# Patient Record
Sex: Female | Born: 1955 | Race: White | Hispanic: No | Marital: Married | Smoking: Never smoker
Health system: Southern US, Community
[De-identification: ages and names within clinical notes are randomized; demographics above are authoritative.]

## PROBLEM LIST (undated history)

## (undated) DIAGNOSIS — Z8619 Personal history of other infectious and parasitic diseases: Secondary | ICD-10-CM

## (undated) DIAGNOSIS — D509 Iron deficiency anemia, unspecified: Secondary | ICD-10-CM

## (undated) DIAGNOSIS — R112 Nausea with vomiting, unspecified: Secondary | ICD-10-CM

## (undated) DIAGNOSIS — Z8669 Personal history of other diseases of the nervous system and sense organs: Secondary | ICD-10-CM

## (undated) DIAGNOSIS — T451X5A Adverse effect of antineoplastic and immunosuppressive drugs, initial encounter: Secondary | ICD-10-CM

## (undated) DIAGNOSIS — N96 Recurrent pregnancy loss: Secondary | ICD-10-CM

## (undated) DIAGNOSIS — C189 Malignant neoplasm of colon, unspecified: Secondary | ICD-10-CM

## (undated) DIAGNOSIS — Z9889 Other specified postprocedural states: Secondary | ICD-10-CM

## (undated) HISTORY — DX: Personal history of other diseases of the nervous system and sense organs: Z86.69

## (undated) HISTORY — DX: Personal history of other infectious and parasitic diseases: Z86.19

## (undated) HISTORY — PX: TONSILLECTOMY: SUR1361

## (undated) HISTORY — DX: Iron deficiency anemia, unspecified: D50.9

## (undated) HISTORY — DX: Recurrent pregnancy loss: N96

---

## 1999-02-06 ENCOUNTER — Other Ambulatory Visit: Admission: RE | Admit: 1999-02-06 | Discharge: 1999-02-06 | Payer: Self-pay | Admitting: Obstetrics & Gynecology

## 2007-01-10 ENCOUNTER — Emergency Department (HOSPITAL_COMMUNITY): Admission: EM | Admit: 2007-01-10 | Discharge: 2007-01-10 | Payer: Self-pay | Admitting: Emergency Medicine

## 2007-11-04 HISTORY — PX: SHOULDER SURGERY: SHX246

## 2008-05-01 ENCOUNTER — Encounter: Admission: RE | Admit: 2008-05-01 | Discharge: 2008-05-01 | Payer: Self-pay | Admitting: Sports Medicine

## 2008-09-21 ENCOUNTER — Ambulatory Visit (HOSPITAL_BASED_OUTPATIENT_CLINIC_OR_DEPARTMENT_OTHER): Admission: RE | Admit: 2008-09-21 | Discharge: 2008-09-21 | Payer: Self-pay | Admitting: Orthopedic Surgery

## 2010-12-02 ENCOUNTER — Encounter
Admission: RE | Admit: 2010-12-02 | Discharge: 2010-12-02 | Payer: Self-pay | Source: Home / Self Care | Attending: Family Medicine | Admitting: Family Medicine

## 2010-12-04 ENCOUNTER — Ambulatory Visit
Admission: RE | Admit: 2010-12-04 | Discharge: 2010-12-04 | Disposition: A | Discharge status: Home / Self Care | Payer: BC Managed Care – PPO | Source: Ambulatory Visit | Attending: Family Medicine | Admitting: Family Medicine

## 2010-12-04 ENCOUNTER — Other Ambulatory Visit: Payer: Self-pay | Admitting: Family Medicine

## 2010-12-04 DIAGNOSIS — R160 Hepatomegaly, not elsewhere classified: Secondary | ICD-10-CM

## 2010-12-04 DIAGNOSIS — C189 Malignant neoplasm of colon, unspecified: Secondary | ICD-10-CM

## 2010-12-04 HISTORY — DX: Malignant neoplasm of colon, unspecified: C18.9

## 2010-12-04 MED ORDER — GADOXETATE DISODIUM 0.25 MMOL/ML IV SOLN: 6.0000 mL | Freq: Once | INTRAVENOUS | Status: AC | PRN

## 2010-12-06 ENCOUNTER — Other Ambulatory Visit (HOSPITAL_COMMUNITY): Payer: Self-pay | Admitting: Family Medicine

## 2010-12-06 DIAGNOSIS — R160 Hepatomegaly, not elsewhere classified: Secondary | ICD-10-CM

## 2010-12-10 ENCOUNTER — Other Ambulatory Visit (HOSPITAL_COMMUNITY): Payer: Self-pay | Admitting: Family Medicine

## 2010-12-10 ENCOUNTER — Other Ambulatory Visit: Payer: Self-pay | Admitting: Diagnostic Radiology

## 2010-12-10 ENCOUNTER — Ambulatory Visit (HOSPITAL_COMMUNITY)
Admission: RE | Admit: 2010-12-10 | Discharge: 2010-12-10 | Disposition: A | Discharge status: Home / Self Care | Payer: BC Managed Care – PPO | Source: Ambulatory Visit | Attending: Family Medicine | Admitting: Family Medicine

## 2010-12-10 DIAGNOSIS — R160 Hepatomegaly, not elsewhere classified: Secondary | ICD-10-CM

## 2010-12-10 DIAGNOSIS — K7689 Other specified diseases of liver: Secondary | ICD-10-CM | POA: Insufficient documentation

## 2010-12-10 LAB — CBC
MCH: 25.1 pg — ABNORMAL LOW (ref 26.0–34.0)
MCHC: 32.2 g/dL (ref 30.0–36.0)
Platelets: 262 10*3/uL (ref 150–400)
RBC: 4.74 MIL/uL (ref 3.87–5.11)
RDW: 16.5 % — ABNORMAL HIGH (ref 11.5–15.5)

## 2010-12-10 LAB — APTT: aPTT: 32 seconds (ref 24–37)

## 2010-12-11 ENCOUNTER — Encounter (HOSPITAL_BASED_OUTPATIENT_CLINIC_OR_DEPARTMENT_OTHER): Payer: BC Managed Care – PPO | Admitting: Oncology

## 2010-12-11 ENCOUNTER — Other Ambulatory Visit: Payer: Self-pay | Admitting: Oncology

## 2010-12-11 DIAGNOSIS — C787 Secondary malignant neoplasm of liver and intrahepatic bile duct: Secondary | ICD-10-CM

## 2010-12-13 ENCOUNTER — Ambulatory Visit (HOSPITAL_COMMUNITY)
Admission: RE | Admit: 2010-12-13 | Discharge: 2010-12-13 | Disposition: A | Discharge status: Home / Self Care | Payer: BC Managed Care – PPO | Source: Ambulatory Visit | Attending: Oncology | Admitting: Oncology

## 2010-12-13 DIAGNOSIS — R1011 Right upper quadrant pain: Secondary | ICD-10-CM | POA: Insufficient documentation

## 2010-12-13 DIAGNOSIS — C787 Secondary malignant neoplasm of liver and intrahepatic bile duct: Secondary | ICD-10-CM | POA: Insufficient documentation

## 2010-12-13 DIAGNOSIS — K6389 Other specified diseases of intestine: Secondary | ICD-10-CM | POA: Insufficient documentation

## 2010-12-13 MED ORDER — IOHEXOL 300 MG/ML  SOLN
100.0000 mL | Freq: Once | INTRAMUSCULAR | Status: AC | PRN
  Administered 2010-12-13: 100 mL via INTRAVENOUS

## 2010-12-17 ENCOUNTER — Ambulatory Visit (HOSPITAL_COMMUNITY)
Admission: RE | Admit: 2010-12-17 | Discharge: 2010-12-17 | Disposition: A | Discharge status: Home / Self Care | Payer: BC Managed Care – PPO | Source: Ambulatory Visit | Attending: Gastroenterology | Admitting: Gastroenterology

## 2010-12-17 ENCOUNTER — Other Ambulatory Visit: Payer: Self-pay | Admitting: Gastroenterology

## 2010-12-17 DIAGNOSIS — K648 Other hemorrhoids: Secondary | ICD-10-CM | POA: Insufficient documentation

## 2010-12-17 DIAGNOSIS — D126 Benign neoplasm of colon, unspecified: Secondary | ICD-10-CM | POA: Insufficient documentation

## 2010-12-17 DIAGNOSIS — C184 Malignant neoplasm of transverse colon: Secondary | ICD-10-CM | POA: Insufficient documentation

## 2010-12-17 DIAGNOSIS — K644 Residual hemorrhoidal skin tags: Secondary | ICD-10-CM | POA: Insufficient documentation

## 2010-12-17 DIAGNOSIS — C787 Secondary malignant neoplasm of liver and intrahepatic bile duct: Secondary | ICD-10-CM | POA: Insufficient documentation

## 2010-12-18 ENCOUNTER — Other Ambulatory Visit: Payer: Self-pay | Admitting: Oncology

## 2010-12-18 ENCOUNTER — Encounter (HOSPITAL_BASED_OUTPATIENT_CLINIC_OR_DEPARTMENT_OTHER): Payer: BC Managed Care – PPO | Admitting: Oncology

## 2010-12-18 DIAGNOSIS — C229 Malignant neoplasm of liver, not specified as primary or secondary: Secondary | ICD-10-CM

## 2010-12-18 DIAGNOSIS — C787 Secondary malignant neoplasm of liver and intrahepatic bile duct: Secondary | ICD-10-CM

## 2010-12-20 ENCOUNTER — Ambulatory Visit (HOSPITAL_COMMUNITY)
Admission: RE | Admit: 2010-12-20 | Discharge: 2010-12-20 | Disposition: A | Discharge status: Home / Self Care | Payer: BC Managed Care – PPO | Source: Ambulatory Visit | Attending: Oncology | Admitting: Oncology

## 2010-12-20 DIAGNOSIS — C787 Secondary malignant neoplasm of liver and intrahepatic bile duct: Secondary | ICD-10-CM | POA: Insufficient documentation

## 2010-12-20 DIAGNOSIS — C229 Malignant neoplasm of liver, not specified as primary or secondary: Secondary | ICD-10-CM

## 2010-12-20 DIAGNOSIS — C189 Malignant neoplasm of colon, unspecified: Secondary | ICD-10-CM | POA: Insufficient documentation

## 2010-12-20 HISTORY — PX: PORTACATH PLACEMENT: SHX2246

## 2010-12-24 ENCOUNTER — Other Ambulatory Visit: Payer: Self-pay | Admitting: Oncology

## 2010-12-24 ENCOUNTER — Encounter (HOSPITAL_BASED_OUTPATIENT_CLINIC_OR_DEPARTMENT_OTHER): Payer: BC Managed Care – PPO | Admitting: Oncology

## 2010-12-24 DIAGNOSIS — C184 Malignant neoplasm of transverse colon: Secondary | ICD-10-CM

## 2010-12-24 DIAGNOSIS — C787 Secondary malignant neoplasm of liver and intrahepatic bile duct: Secondary | ICD-10-CM

## 2010-12-24 DIAGNOSIS — Z136 Encounter for screening for cardiovascular disorders: Secondary | ICD-10-CM

## 2010-12-24 LAB — URINALYSIS, MICROSCOPIC - CHCC
Bilirubin (Urine): NEGATIVE
Blood: NEGATIVE
Leukocyte Esterase: NEGATIVE
Nitrite: NEGATIVE
Specific Gravity, Urine: 1.02 (ref 1.003–1.035)
pH: 5 (ref 4.6–8.0)

## 2010-12-24 LAB — CBC WITH DIFFERENTIAL/PLATELET
Basophils Absolute: 0 10*3/uL (ref 0.0–0.1)
EOS%: 1 % (ref 0.0–7.0)
Eosinophils Absolute: 0.1 10*3/uL (ref 0.0–0.5)
HCT: 35.3 % (ref 34.8–46.6)
HGB: 11.7 g/dL (ref 11.6–15.9)
MCH: 25.9 pg (ref 25.1–34.0)
MCV: 78.1 fL — ABNORMAL LOW (ref 79.5–101.0)
MONO%: 8.8 % (ref 0.0–14.0)
NEUT%: 82.7 % — ABNORMAL HIGH (ref 38.4–76.8)

## 2010-12-24 LAB — COMPREHENSIVE METABOLIC PANEL
AST: 106 U/L — ABNORMAL HIGH (ref 0–37)
Albumin: 3.6 g/dL (ref 3.5–5.2)
BUN: 12 mg/dL (ref 6–23)
Calcium: 9.4 mg/dL (ref 8.4–10.5)
Chloride: 94 mEq/L — ABNORMAL LOW (ref 96–112)
Potassium: 3.6 mEq/L (ref 3.5–5.3)
Sodium: 133 mEq/L — ABNORMAL LOW (ref 135–145)
Total Protein: 7.9 g/dL (ref 6.0–8.3)

## 2010-12-24 LAB — HCG, SERUM, QUALITATIVE: Preg, Serum: NEGATIVE

## 2010-12-24 LAB — APTT: aPTT: 37 seconds (ref 24–37)

## 2010-12-31 ENCOUNTER — Encounter (HOSPITAL_BASED_OUTPATIENT_CLINIC_OR_DEPARTMENT_OTHER): Payer: BC Managed Care – PPO | Admitting: Oncology

## 2010-12-31 ENCOUNTER — Other Ambulatory Visit: Payer: Self-pay | Admitting: Oncology

## 2010-12-31 DIAGNOSIS — C787 Secondary malignant neoplasm of liver and intrahepatic bile duct: Secondary | ICD-10-CM

## 2010-12-31 DIAGNOSIS — Z5112 Encounter for antineoplastic immunotherapy: Secondary | ICD-10-CM

## 2010-12-31 DIAGNOSIS — Z5111 Encounter for antineoplastic chemotherapy: Secondary | ICD-10-CM

## 2010-12-31 DIAGNOSIS — C184 Malignant neoplasm of transverse colon: Secondary | ICD-10-CM

## 2011-01-01 LAB — CEA: CEA: 1806.5 ng/mL — ABNORMAL HIGH (ref 0.0–5.0)

## 2011-01-02 ENCOUNTER — Encounter (HOSPITAL_BASED_OUTPATIENT_CLINIC_OR_DEPARTMENT_OTHER): Payer: BC Managed Care – PPO | Admitting: Oncology

## 2011-01-02 DIAGNOSIS — C184 Malignant neoplasm of transverse colon: Secondary | ICD-10-CM

## 2011-01-02 DIAGNOSIS — C787 Secondary malignant neoplasm of liver and intrahepatic bile duct: Secondary | ICD-10-CM

## 2011-01-04 ENCOUNTER — Observation Stay (HOSPITAL_COMMUNITY)
Admission: AD | Admit: 2011-01-04 | Discharge: 2011-01-05 | Disposition: A | Discharge status: Home / Self Care | Payer: BC Managed Care – PPO | Source: Ambulatory Visit | Attending: Oncology | Admitting: Oncology

## 2011-01-04 DIAGNOSIS — Z79899 Other long term (current) drug therapy: Secondary | ICD-10-CM | POA: Insufficient documentation

## 2011-01-04 DIAGNOSIS — T451X5A Adverse effect of antineoplastic and immunosuppressive drugs, initial encounter: Secondary | ICD-10-CM | POA: Insufficient documentation

## 2011-01-04 DIAGNOSIS — C787 Secondary malignant neoplasm of liver and intrahepatic bile duct: Secondary | ICD-10-CM | POA: Insufficient documentation

## 2011-01-04 DIAGNOSIS — R109 Unspecified abdominal pain: Secondary | ICD-10-CM | POA: Insufficient documentation

## 2011-01-04 DIAGNOSIS — R112 Nausea with vomiting, unspecified: Principal | ICD-10-CM | POA: Insufficient documentation

## 2011-01-04 DIAGNOSIS — G43909 Migraine, unspecified, not intractable, without status migrainosus: Secondary | ICD-10-CM | POA: Insufficient documentation

## 2011-01-04 DIAGNOSIS — C189 Malignant neoplasm of colon, unspecified: Secondary | ICD-10-CM

## 2011-01-04 DIAGNOSIS — D509 Iron deficiency anemia, unspecified: Secondary | ICD-10-CM | POA: Insufficient documentation

## 2011-01-04 DIAGNOSIS — C184 Malignant neoplasm of transverse colon: Secondary | ICD-10-CM | POA: Insufficient documentation

## 2011-01-05 DIAGNOSIS — C189 Malignant neoplasm of colon, unspecified: Secondary | ICD-10-CM

## 2011-01-10 NOTE — H&P (Signed)
Natalie Mccarthy, Natalie Mccarthy            ACCOUNT NO.:  [id number]  MEDICAL RECORD NO.:  [id number]           PATIENT TYPE:  I  LOCATION:  1308                         FACILITY:  [facility]  PHYSICIAN:  Leighton Roach. Truett Perna, M.D. DATE OF BIRTH:  Jul 09, 1956  DATE OF ADMISSION:  01/04/2011 DATE OF DISCHARGE:                             HISTORY & PHYSICAL   PATIENT IDENTIFICATION:  Natalie Mccarthy is a 55 year old with a new diagnosis of metastatic colon cancer.  She is now admitted with intractable nausea/vomiting after a first cycle of FOLFIRI/Avastin chemotherapy.  HISTORY OF PRESENT ILLNESS:  Natalie Mccarthy presented with abdominal pain and intermittent nausea/vomiting.  She saw Dr. Corliss Blacker on November 22, 2010, and a mass was palpated in the mid epigastric region.  An abdominal ultrasound revealed multiple mass-like lesions in the liver. An MRI of the abdomen on December 04, 2010, confirmed multiple large masses in the liver concerning for a malignancy.  An area of wall thickening was noted at the transverse colon.  She underwent a diagnostic liver biopsy in interventional radiology on December 10, 2010, and the pathology confirmed metastatic adenocarcinoma.  She was referred to Dr. Ewing Schlein and underwent a colonoscopy on December 17, 2010.  This confirmed an ulcerated mass in the distal mid transverse colon.  A biopsy confirmed adenocarcinoma.  She enrolled in the [name] study and completed a first cycle of FOLFIRI/Avastin on 02/28.  The 5-FU pump was discontinued on 03/01.  She reports the onset of nausea and vomiting beginning on the evening of 03/01.  The nausea and vomiting has persisted.  She has been unable to tolerate liquids or solids.  Ativan has helped the nausea partially.  She denies diarrhea.  She reports no bowel movement for the past 2-3 days.  She presented to the cancer center for further evaluation today.  She received intravenous fluids and antiemetics at the cancer center  and continued to have nausea.  She had one episode of emesis while at the cancer center.  PAST MEDICAL HISTORY: 1. Migraine headaches. 2. G6, P4, 2 miscarriages, last menstrual cycle in September 2011. 3. History of mononucleosis.  PAST SURGICAL HISTORY: 1. Left shoulder surgery 2-3 years ago. 2. Tonsillectomy as a child. 3. Uterine fibroids surgery for management of heavy menses.  ALLERGIES:  PROCHLORPERAZINE.  CURRENT MEDICATIONS: 1. Ativan 0.5 to 1 mg q.6 h p.r.n. 2. Zofran 8 mg q.12 h p.r.n. 3. Tramadol 50 mg q.6 h p.r.n. 4. Imitrex 100 mg p.r.n. migraine headache. 5. Ibuprofen 800 mg p.r.n. headache.  FAMILY HISTORY:  No family history of cancer.  SOCIAL HISTORY:  She lives with her husband in [place].  She works as an Print production planner.  She does not use tobacco or alcohol.  She has no transfusion history.  She denies risk factors for HIV and hepatitis.  REVIEW OF SYSTEMS:  CONSTITUTIONAL:  She denies fever.  RESPIRATORY: Negative.  HEART:  Negative.  GU:  Negative.  GI:  As per HPI.  Shereports no bowel movement for 2-3 days.  She has intermittent right upper abdominal pain.  She also reports mild low abdominal pain for the past few days.  NEUROLOGIC:  Negative.  SKIN:  Negative.  PHYSICAL EXAMINATION:  VITAL SIGNS:  Blood pressure 112/74, pulse 80, temperature 97.9.  HEENT:  No thrush or ulcers.  LUNGS:  Clear.  HEART: Regular rhythm.  Port-A-Cath site without erythema.  ABDOMEN:  Positive hepatomegaly.  Mild tenderness in the right upper quadrant.  The abdomen is soft.  The bowel sounds are hypoactive.  EXTREMITIES:  No edema.  The skin turgor appears diminished.  Labs:  None today.  IMPRESSION: 1. Metastatic colon cancer. (a)  Multiple liver masses noted on an MRI of the abdomen, December 02, 2010, and on a CT December 13, 2010. (b)  Transverse colon mass on a CT, December 13, 2010, confirmed on a colonoscopy, December 17, 1998. (c)  Status post cycle #1 of  FOLFIRI/Avastin on December 31, 2010. 1. Abdominal pain secondary to hepatomegaly/liver metastases. 2. Intractable nausea/vomiting:  She appears to have delayed nausea     secondary to chemotherapy. 3. Anorexia, early satiety, and weight loss secondary to metastatic     colon cancer. 4. History of microcytic anemia. 5. History of migraine headaches. 6. Status post uterine fibroid surgery. 7. Status post Port-A-Cath placement December 20, 2010.  PLAN:  Ms. Goya presents with intractable nausea and vomiting at day #5 following a first cycle of FOLFIRI/Avastin.  She appears to have delayed nausea related to chemotherapy.  She received intravenous fluids, 10 mg of Decadron, and 8 mg of Zofran in the chemotherapy room today.  She had an episode of emesis while in the chemotherapy room and continues to have nausea.  She will be admitted for continued intravenous hydration and antiemetic support.  We will give her another dose of Decadron this evening.  She will use Ativan and Zofran as needed.  We will advance her diet as tolerated.  Hopefully her symptoms will improve over the next 12-24 hours.  She will be discharged to home when the nausea is under better control.  We will adjust the antiemetic regimen with the second cycle of FOLFIRI/Avastin.     Leighton Roach Truett Perna, M.D.     GBS/MEDQ  D:  01/04/2011  T:  01/04/2011  Job:  [id number]  cc:   Research Office  Electronically Signed by Thornton Papas M.D. on 01/09/2011 08:30:51 PM

## 2011-01-10 NOTE — Op Note (Signed)
NAMEILEANE, SANDO NO.:  [id number]  MEDICAL RECORD NO.:  [id number]           PATIENT TYPE:  O  LOCATION:  WLEN                         FACILITY:  [facility]  PHYSICIAN:  Petra Kuba, M.D.    DATE OF BIRTH:  [birth year]  DATE OF PROCEDURE: DATE OF DISCHARGE:                              OPERATIVE REPORT   PROCEDURE:  Colonoscopy with biopsy and polypectomy.  INDICATIONS:  Patient with probable metastatic colon cancer to the liver.  Wanted to confirm.  Consent was signed after risks, benefits, methods and options were thoroughly discussed with me prior to sedation as well as with my nurse in the office.  MEDICINES USED:  Fentanyl 100 mcg, Versed 2 mg.  PROCEDURE IN DETAIL:  Rectal inspection is pertinent for external hemorrhoids, small.  Digital examination was negative.  The pediatric video colonoscope was inserted and with some difficulty due to a tortuous sigmoid, which required abdominal pressure.  Once past that area, was easily able to advance to the cecum.  On insertion, a distal pedunculated sigmoid polyp was seen, medium-sized, as well as a tiny mid- descending polyp.  The mass was seen in the mid-to-distal transverse and the scope passed easily by it, even though it was half circumferential and ulcerative and the cecum was identified by the appendiceal orifice and the ileocecal valve.  The preparation was adequate.  There was minimal liquid stool that required washing and suctioning.  On slow withdrawal through the colon, the cecum, ascending and transverse proximal to the mass was normal.  A few cold biopsies of the mass was obtained.  The proper position of the mass was confirmed as above.  The scope was further withdrawn.  We elected not to biopsy the tiny descending polyp.  However, we went ahead and snared the distal sigmoid pedunculated polyp.  Applied electrocautery.  The polyp was removed. The stalk had a nice white coagulum without  bleeding.  The polyp was suctioned onto the head of the scope and the polyp was recovered.  The scope was reinserted to the level of the polypectomy site.  There was no bleeding.  The distal sigmoid was normal.  Anorectal pull-through and retroflexion once back in the rectum confirmed some small hemorrhoids. The scope was straightened and readvanced one more time up the sigmoid. Air was suctioned.  The scope was removed.  The patient tolerated the procedure well.  There was no obvious immediate complication.  ENDOSCOPIC DIAGNOSES: 1. Internal and external small hemorrhoids. 2. Distal sigmoid moderate-sized pedunculated polyp status post snare. 3. Ulcerative mass in the distal mid transverse status post biopsy. 4. One tiny descending polyp, not biopsied. 5. Otherwise within normal limits to the cecum.  PLAN:  Await pathology.  Hold aspirin and nonsteroidals for two weeks. Chemotherapy and further workup and plans per Dr. Truett Perna.  Happy to see back p.r.n. and repeat screening if she responds excellently to chemotherapy per Dr. Truett Perna and I will be on standby to help p.r.n.          ______________________________ Petra Kuba, M.D.     MEM/MEDQ  D:  12/17/2010  T:  12/17/2010  Job:  [id number]  cc:   Petra Kuba, M.D. Fax: [phone]  Pam Drown, M.D. Fax: [phone]  Leighton Roach Truett Perna, M.D. Fax: [phone]  Electronically Signed by Vida Rigger M.D. on 01/09/2011 07:59:23 AM

## 2011-01-14 ENCOUNTER — Other Ambulatory Visit: Payer: Self-pay | Admitting: Oncology

## 2011-01-14 ENCOUNTER — Encounter (HOSPITAL_BASED_OUTPATIENT_CLINIC_OR_DEPARTMENT_OTHER): Payer: BC Managed Care – PPO | Admitting: Oncology

## 2011-01-14 DIAGNOSIS — Z5112 Encounter for antineoplastic immunotherapy: Secondary | ICD-10-CM

## 2011-01-14 DIAGNOSIS — C184 Malignant neoplasm of transverse colon: Secondary | ICD-10-CM

## 2011-01-14 DIAGNOSIS — Z136 Encounter for screening for cardiovascular disorders: Secondary | ICD-10-CM

## 2011-01-14 DIAGNOSIS — Z5111 Encounter for antineoplastic chemotherapy: Secondary | ICD-10-CM

## 2011-01-14 DIAGNOSIS — C787 Secondary malignant neoplasm of liver and intrahepatic bile duct: Secondary | ICD-10-CM

## 2011-01-14 LAB — URINALYSIS, MICROSCOPIC - CHCC
Bilirubin (Urine): NEGATIVE
Ketones: NEGATIVE mg/dL
RBC count: NEGATIVE (ref 0–2)
WBC, UA: NEGATIVE (ref 0–2)
pH: 6 (ref 4.6–8.0)

## 2011-01-14 LAB — CBC WITH DIFFERENTIAL/PLATELET
BASO%: 2 % (ref 0.0–2.0)
Basophils Absolute: 0.1 10*3/uL (ref 0.0–0.1)
EOS%: 3.8 % (ref 0.0–7.0)
HGB: 12.3 g/dL (ref 11.6–15.9)
MCH: 25.7 pg (ref 25.1–34.0)
MONO#: 0.7 10*3/uL (ref 0.1–0.9)
RDW: 17.9 % — ABNORMAL HIGH (ref 11.2–14.5)
WBC: 3.8 10*3/uL — ABNORMAL LOW (ref 3.9–10.3)
lymph#: 0.8 10*3/uL — ABNORMAL LOW (ref 0.9–3.3)

## 2011-01-14 LAB — COMPREHENSIVE METABOLIC PANEL
AST: 39 U/L — ABNORMAL HIGH (ref 0–37)
Alkaline Phosphatase: 206 U/L — ABNORMAL HIGH (ref 39–117)
BUN: 13 mg/dL (ref 6–23)
Creatinine, Ser: 0.67 mg/dL (ref 0.40–1.20)

## 2011-01-16 DIAGNOSIS — Z452 Encounter for adjustment and management of vascular access device: Secondary | ICD-10-CM

## 2011-01-28 ENCOUNTER — Other Ambulatory Visit: Payer: Self-pay | Admitting: Oncology

## 2011-01-28 ENCOUNTER — Encounter (HOSPITAL_BASED_OUTPATIENT_CLINIC_OR_DEPARTMENT_OTHER): Payer: BC Managed Care – PPO | Admitting: Oncology

## 2011-01-28 DIAGNOSIS — Z136 Encounter for screening for cardiovascular disorders: Secondary | ICD-10-CM

## 2011-01-28 DIAGNOSIS — Z5112 Encounter for antineoplastic immunotherapy: Secondary | ICD-10-CM

## 2011-01-28 DIAGNOSIS — C184 Malignant neoplasm of transverse colon: Secondary | ICD-10-CM

## 2011-01-28 DIAGNOSIS — Z5111 Encounter for antineoplastic chemotherapy: Secondary | ICD-10-CM

## 2011-01-28 DIAGNOSIS — C189 Malignant neoplasm of colon, unspecified: Secondary | ICD-10-CM

## 2011-01-28 DIAGNOSIS — C787 Secondary malignant neoplasm of liver and intrahepatic bile duct: Secondary | ICD-10-CM

## 2011-01-28 LAB — URINALYSIS, MICROSCOPIC - CHCC
Glucose: NEGATIVE g/dL
Nitrite: NEGATIVE
RBC count: NEGATIVE (ref 0–2)
Specific Gravity, Urine: 1.03 (ref 1.003–1.035)
WBC, UA: NEGATIVE (ref 0–2)

## 2011-01-28 LAB — CBC WITH DIFFERENTIAL/PLATELET
Basophils Absolute: 0.1 10*3/uL (ref 0.0–0.1)
Eosinophils Absolute: 0.1 10*3/uL (ref 0.0–0.5)
HGB: 12.9 g/dL (ref 11.6–15.9)
LYMPH%: 13.1 % — ABNORMAL LOW (ref 14.0–49.7)
MCV: 78.2 fL — ABNORMAL LOW (ref 79.5–101.0)
MONO%: 21.7 % — ABNORMAL HIGH (ref 0.0–14.0)
NEUT#: 3.9 10*3/uL (ref 1.5–6.5)
Platelets: 204 10*3/uL (ref 145–400)

## 2011-01-28 LAB — COMPREHENSIVE METABOLIC PANEL
Albumin: 3.5 g/dL (ref 3.5–5.2)
Alkaline Phosphatase: 172 U/L — ABNORMAL HIGH (ref 39–117)
BUN: 12 mg/dL (ref 6–23)
Glucose, Bld: 128 mg/dL — ABNORMAL HIGH (ref 70–99)
Potassium: 4.1 mEq/L (ref 3.5–5.3)
Total Bilirubin: 1 mg/dL (ref 0.3–1.2)

## 2011-01-29 ENCOUNTER — Encounter: Payer: BC Managed Care – PPO | Admitting: Oncology

## 2011-01-30 ENCOUNTER — Encounter (HOSPITAL_BASED_OUTPATIENT_CLINIC_OR_DEPARTMENT_OTHER): Payer: BC Managed Care – PPO | Admitting: Oncology

## 2011-01-30 DIAGNOSIS — C184 Malignant neoplasm of transverse colon: Secondary | ICD-10-CM

## 2011-01-30 DIAGNOSIS — C787 Secondary malignant neoplasm of liver and intrahepatic bile duct: Secondary | ICD-10-CM

## 2011-02-06 ENCOUNTER — Encounter (HOSPITAL_COMMUNITY): Payer: Self-pay

## 2011-02-06 ENCOUNTER — Ambulatory Visit (HOSPITAL_COMMUNITY)
Admission: RE | Admit: 2011-02-06 | Discharge: 2011-02-06 | Disposition: A | Discharge status: Home / Self Care | Payer: BC Managed Care – PPO | Source: Ambulatory Visit | Attending: Oncology | Admitting: Oncology

## 2011-02-06 DIAGNOSIS — C787 Secondary malignant neoplasm of liver and intrahepatic bile duct: Secondary | ICD-10-CM | POA: Insufficient documentation

## 2011-02-06 DIAGNOSIS — C189 Malignant neoplasm of colon, unspecified: Secondary | ICD-10-CM

## 2011-02-06 DIAGNOSIS — C184 Malignant neoplasm of transverse colon: Secondary | ICD-10-CM | POA: Insufficient documentation

## 2011-02-06 HISTORY — DX: Malignant neoplasm of colon, unspecified: C18.9

## 2011-02-06 MED ORDER — IOHEXOL 300 MG/ML  SOLN
100.0000 mL | Freq: Once | INTRAMUSCULAR | Status: AC | PRN
  Administered 2011-02-06: 100 mL via INTRAVENOUS

## 2011-02-11 ENCOUNTER — Other Ambulatory Visit: Payer: Self-pay | Admitting: Oncology

## 2011-02-11 ENCOUNTER — Encounter (HOSPITAL_BASED_OUTPATIENT_CLINIC_OR_DEPARTMENT_OTHER): Payer: BC Managed Care – PPO | Admitting: Oncology

## 2011-02-11 DIAGNOSIS — Z136 Encounter for screening for cardiovascular disorders: Secondary | ICD-10-CM

## 2011-02-11 DIAGNOSIS — D509 Iron deficiency anemia, unspecified: Secondary | ICD-10-CM

## 2011-02-11 DIAGNOSIS — C184 Malignant neoplasm of transverse colon: Secondary | ICD-10-CM

## 2011-02-11 DIAGNOSIS — Z5112 Encounter for antineoplastic immunotherapy: Secondary | ICD-10-CM

## 2011-02-11 DIAGNOSIS — C787 Secondary malignant neoplasm of liver and intrahepatic bile duct: Secondary | ICD-10-CM

## 2011-02-11 LAB — URINALYSIS, MICROSCOPIC - CHCC
Glucose: NEGATIVE g/dL
Leukocyte Esterase: NEGATIVE
Nitrite: NEGATIVE
Protein: NEGATIVE mg/dL

## 2011-02-11 LAB — CBC WITH DIFFERENTIAL/PLATELET
BASO%: 2.2 % — ABNORMAL HIGH (ref 0.0–2.0)
EOS%: 5.2 % (ref 0.0–7.0)
Eosinophils Absolute: 0.1 10*3/uL (ref 0.0–0.5)
LYMPH%: 29.6 % (ref 14.0–49.7)
MCH: 25.6 pg (ref 25.1–34.0)
MCHC: 32.4 g/dL (ref 31.5–36.0)
MCV: 78.9 fL — ABNORMAL LOW (ref 79.5–101.0)
MONO%: 34.8 % — ABNORMAL HIGH (ref 0.0–14.0)
NEUT#: 0.8 10*3/uL — ABNORMAL LOW (ref 1.5–6.5)
Platelets: 249 10*3/uL (ref 145–400)
RBC: 4.65 10*6/uL (ref 3.70–5.45)
RDW: 17.8 % — ABNORMAL HIGH (ref 11.2–14.5)
nRBC: 0 % (ref 0–0)

## 2011-02-11 LAB — COMPREHENSIVE METABOLIC PANEL
Alkaline Phosphatase: 171 U/L — ABNORMAL HIGH (ref 39–117)
BUN: 12 mg/dL (ref 6–23)
CO2: 29 mEq/L (ref 19–32)
Glucose, Bld: 99 mg/dL (ref 70–99)
Total Bilirubin: 0.6 mg/dL (ref 0.3–1.2)
Total Protein: 6.8 g/dL (ref 6.0–8.3)

## 2011-02-17 ENCOUNTER — Other Ambulatory Visit: Payer: Self-pay | Admitting: Oncology

## 2011-02-17 ENCOUNTER — Encounter (HOSPITAL_BASED_OUTPATIENT_CLINIC_OR_DEPARTMENT_OTHER): Payer: BC Managed Care – PPO | Admitting: Oncology

## 2011-02-17 DIAGNOSIS — Z136 Encounter for screening for cardiovascular disorders: Secondary | ICD-10-CM

## 2011-02-17 DIAGNOSIS — Z5111 Encounter for antineoplastic chemotherapy: Secondary | ICD-10-CM

## 2011-02-17 DIAGNOSIS — C787 Secondary malignant neoplasm of liver and intrahepatic bile duct: Secondary | ICD-10-CM

## 2011-02-17 DIAGNOSIS — C184 Malignant neoplasm of transverse colon: Secondary | ICD-10-CM

## 2011-02-17 DIAGNOSIS — Z452 Encounter for adjustment and management of vascular access device: Secondary | ICD-10-CM

## 2011-02-17 LAB — CBC WITH DIFFERENTIAL/PLATELET
Basophils Absolute: 0.1 10*3/uL (ref 0.0–0.1)
Eosinophils Absolute: 0.1 10*3/uL (ref 0.0–0.5)
HGB: 11.8 g/dL (ref 11.6–15.9)
MCV: 79.9 fL (ref 79.5–101.0)
MONO#: 1.4 10*3/uL — ABNORMAL HIGH (ref 0.1–0.9)
MONO%: 19.2 % — ABNORMAL HIGH (ref 0.0–14.0)
NEUT#: 4.9 10*3/uL (ref 1.5–6.5)
Platelets: 369 10*3/uL (ref 145–400)
RBC: 4.39 10*6/uL (ref 3.70–5.45)
RDW: 19.7 % — ABNORMAL HIGH (ref 11.2–14.5)
WBC: 7.5 10*3/uL (ref 3.9–10.3)

## 2011-02-17 LAB — COMPREHENSIVE METABOLIC PANEL
ALT: 24 U/L (ref 0–35)
AST: 21 U/L (ref 0–37)
Albumin: 2.7 g/dL — ABNORMAL LOW (ref 3.5–5.2)
CO2: 29 mEq/L (ref 19–32)
Calcium: 9.3 mg/dL (ref 8.4–10.5)
Chloride: 99 mEq/L (ref 96–112)
Potassium: 4.9 mEq/L (ref 3.5–5.3)
Sodium: 136 mEq/L (ref 135–145)
Total Protein: 7 g/dL (ref 6.0–8.3)

## 2011-02-17 LAB — URINALYSIS, MICROSCOPIC - CHCC
Bilirubin (Urine): NEGATIVE
Blood: NEGATIVE
Glucose: NEGATIVE g/dL
Specific Gravity, Urine: 1.03 (ref 1.003–1.035)
pH: 5 (ref 4.6–8.0)

## 2011-02-18 ENCOUNTER — Encounter (HOSPITAL_BASED_OUTPATIENT_CLINIC_OR_DEPARTMENT_OTHER): Payer: BC Managed Care – PPO | Admitting: Oncology

## 2011-02-18 DIAGNOSIS — C184 Malignant neoplasm of transverse colon: Secondary | ICD-10-CM

## 2011-02-18 DIAGNOSIS — C787 Secondary malignant neoplasm of liver and intrahepatic bile duct: Secondary | ICD-10-CM

## 2011-02-18 DIAGNOSIS — Z5111 Encounter for antineoplastic chemotherapy: Secondary | ICD-10-CM

## 2011-02-18 DIAGNOSIS — Z5112 Encounter for antineoplastic immunotherapy: Secondary | ICD-10-CM

## 2011-02-20 ENCOUNTER — Encounter (HOSPITAL_BASED_OUTPATIENT_CLINIC_OR_DEPARTMENT_OTHER): Payer: BC Managed Care – PPO | Admitting: Oncology

## 2011-02-20 DIAGNOSIS — Z5189 Encounter for other specified aftercare: Secondary | ICD-10-CM

## 2011-02-20 DIAGNOSIS — C787 Secondary malignant neoplasm of liver and intrahepatic bile duct: Secondary | ICD-10-CM

## 2011-02-20 DIAGNOSIS — C184 Malignant neoplasm of transverse colon: Secondary | ICD-10-CM

## 2011-03-04 ENCOUNTER — Encounter (HOSPITAL_BASED_OUTPATIENT_CLINIC_OR_DEPARTMENT_OTHER): Payer: BC Managed Care – PPO | Admitting: Oncology

## 2011-03-04 ENCOUNTER — Other Ambulatory Visit: Payer: Self-pay | Admitting: Oncology

## 2011-03-04 DIAGNOSIS — C184 Malignant neoplasm of transverse colon: Secondary | ICD-10-CM

## 2011-03-04 DIAGNOSIS — C787 Secondary malignant neoplasm of liver and intrahepatic bile duct: Secondary | ICD-10-CM

## 2011-03-04 DIAGNOSIS — Z5111 Encounter for antineoplastic chemotherapy: Secondary | ICD-10-CM

## 2011-03-04 DIAGNOSIS — Z5112 Encounter for antineoplastic immunotherapy: Secondary | ICD-10-CM

## 2011-03-04 LAB — URINALYSIS, MICROSCOPIC - CHCC
Blood: NEGATIVE
Nitrite: NEGATIVE
Protein: NEGATIVE mg/dL
Specific Gravity, Urine: 1.03 (ref 1.003–1.035)
pH: 5 (ref 4.6–8.0)

## 2011-03-04 LAB — CBC WITH DIFFERENTIAL/PLATELET
Basophils Absolute: 0.1 10*3/uL (ref 0.0–0.1)
EOS%: 1.3 % (ref 0.0–7.0)
Eosinophils Absolute: 0.2 10*3/uL (ref 0.0–0.5)
HGB: 11.8 g/dL (ref 11.6–15.9)
LYMPH%: 12.8 % — ABNORMAL LOW (ref 14.0–49.7)
MCH: 27.6 pg (ref 25.1–34.0)
MCV: 80.8 fL (ref 79.5–101.0)
MONO%: 1.3 % (ref 0.0–14.0)
NEUT#: 10.5 10*3/uL — ABNORMAL HIGH (ref 1.5–6.5)
NEUT%: 84 % — ABNORMAL HIGH (ref 38.4–76.8)
Platelets: 244 10*3/uL (ref 145–400)

## 2011-03-04 LAB — COMPREHENSIVE METABOLIC PANEL
Albumin: 2.9 g/dL — ABNORMAL LOW (ref 3.5–5.2)
Alkaline Phosphatase: 210 U/L — ABNORMAL HIGH (ref 39–117)
CO2: 28 mEq/L (ref 19–32)
Calcium: 9 mg/dL (ref 8.4–10.5)
Chloride: 106 mEq/L (ref 96–112)
Glucose, Bld: 121 mg/dL — ABNORMAL HIGH (ref 70–99)
Potassium: 4 mEq/L (ref 3.5–5.3)
Sodium: 143 mEq/L (ref 135–145)
Total Protein: 6.5 g/dL (ref 6.0–8.3)

## 2011-03-06 ENCOUNTER — Encounter (HOSPITAL_BASED_OUTPATIENT_CLINIC_OR_DEPARTMENT_OTHER): Payer: BC Managed Care – PPO | Admitting: Oncology

## 2011-03-06 DIAGNOSIS — C787 Secondary malignant neoplasm of liver and intrahepatic bile duct: Secondary | ICD-10-CM

## 2011-03-06 DIAGNOSIS — C184 Malignant neoplasm of transverse colon: Secondary | ICD-10-CM

## 2011-03-06 DIAGNOSIS — Z5189 Encounter for other specified aftercare: Secondary | ICD-10-CM

## 2011-03-18 ENCOUNTER — Other Ambulatory Visit: Payer: Self-pay | Admitting: Oncology

## 2011-03-18 ENCOUNTER — Encounter (HOSPITAL_BASED_OUTPATIENT_CLINIC_OR_DEPARTMENT_OTHER): Payer: BC Managed Care – PPO | Admitting: Oncology

## 2011-03-18 DIAGNOSIS — C184 Malignant neoplasm of transverse colon: Secondary | ICD-10-CM

## 2011-03-18 DIAGNOSIS — C787 Secondary malignant neoplasm of liver and intrahepatic bile duct: Secondary | ICD-10-CM

## 2011-03-18 DIAGNOSIS — D509 Iron deficiency anemia, unspecified: Secondary | ICD-10-CM

## 2011-03-18 DIAGNOSIS — Z5111 Encounter for antineoplastic chemotherapy: Secondary | ICD-10-CM

## 2011-03-18 DIAGNOSIS — Z5112 Encounter for antineoplastic immunotherapy: Secondary | ICD-10-CM

## 2011-03-18 DIAGNOSIS — Z136 Encounter for screening for cardiovascular disorders: Secondary | ICD-10-CM

## 2011-03-18 DIAGNOSIS — C189 Malignant neoplasm of colon, unspecified: Secondary | ICD-10-CM

## 2011-03-18 LAB — CBC WITH DIFFERENTIAL/PLATELET
EOS%: 1.1 % (ref 0.0–7.0)
Eosinophils Absolute: 0.1 10*3/uL (ref 0.0–0.5)
MCH: 27.4 pg (ref 25.1–34.0)
MCV: 82 fL (ref 79.5–101.0)
MONO%: 3 % (ref 0.0–14.0)
NEUT#: 10.8 10*3/uL — ABNORMAL HIGH (ref 1.5–6.5)
RBC: 4.09 10*6/uL (ref 3.70–5.45)
RDW: 21.7 % — ABNORMAL HIGH (ref 11.2–14.5)

## 2011-03-18 LAB — URINALYSIS, MICROSCOPIC - CHCC
Bilirubin (Urine): NEGATIVE
Glucose: NEGATIVE g/dL
Leukocyte Esterase: NEGATIVE
RBC count: NEGATIVE (ref 0–2)

## 2011-03-18 LAB — COMPREHENSIVE METABOLIC PANEL
ALT: 30 U/L (ref 0–35)
AST: 25 U/L (ref 0–37)
Alkaline Phosphatase: 218 U/L — ABNORMAL HIGH (ref 39–117)
Sodium: 138 mEq/L (ref 135–145)
Total Bilirubin: 0.2 mg/dL — ABNORMAL LOW (ref 0.3–1.2)
Total Protein: 6.5 g/dL (ref 6.0–8.3)

## 2011-03-18 NOTE — Op Note (Signed)
Natalie Mccarthy, Natalie Mccarthy            ACCOUNT NO.:  [id number]   MEDICAL RECORD NO.:  [id number]          PATIENT TYPE:  AMB   LOCATION:  DSC                          FACILITY:  [facility]   PHYSICIAN:  Rodney A. Mortenson, M.D.DATE OF BIRTH:  11/16/1955   DATE OF PROCEDURE:  09/21/2008  DATE OF DISCHARGE:                               OPERATIVE REPORT   JUSTIFICATION:  A 55 year old female has left arm pain and mild rotator  cuff symptoms and calcific mass in the mid humerus.  An MRI shows some  subacromial bursitis, no significant change over the [facility] joint, but there  is some cuff tendinopathy.  Calcific body about the medial humerus is  benign with some question of whether this is a loose body from the joint  versus muscle injury.  Because of persistent pain and discomfort, she is  now admitted for arthroscopic evaluation of the joint and removal of the  calcific deposit from the anterior aspect of the humerus.  Questions  answered and encouraged.  Complications discussed.   JUSTIFICATION FOR OUTPATIENT SURGERY:  Minimal morbidity.   PREOPERATIVE DIAGNOSES:  1. Subacromial bursitis, left shoulder.  2. Calcific mass, mid humerus, left arm.   POSTOPERATIVE DIAGNOSES:  1. Subacromial bursitis.  2. 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   OPERATIONS:  1. Diagnostic arthroscopy, left shoulder.  2. Subacromial decompression.  3. Exploration of mass over anterior aspect of biceps adjacent to      biceps tendon.   SURGEON:  Lenard Galloway. Chaney Malling, MD   ASSISTANT:  Oris Drone. Petrarca, PA-C   ANESTHESIA:  General.   PROCEDURE:  The patient was placed on the operating table in a supine  position.  After satisfactory general anesthesia, the patient was placed  in semi-sitting position.  The entire left upper extremity was prepped  with DuraPrep and draped out in the usual manner.  Through the posterior  portal, arthroscope was introduced and anterior operative portal  was  inserted.  A very careful examination of the shoulder was undertaken.  The undersurface of the supraspinatus and infraspinatus were normal.  Biceps tendon appeared normal.  The arthroscope could be passed down the  biceps sheath and the biceps appeared absolutely normal.  Articular  cartilage over the glenoid and the humeral head were absolutely normal.  The labrum was normal.  The subscapularis was normal.  There was a small  cartilage defect on the inferior surface of the glenoid down in the  axillary space area.  The axillary space was examined with very  carefully and no loose bodies or other abnormalities were seen.  No loss  of articular cartilage over the humeral head.  Once this was completed,  attention was turned to the proximal humerus and the arthroscope was  removed.  C-arm was used to localize the calcific deposits.  It was very  hard to see with a regular C-arm.  A regular x-ray unit was brought in  and AP and lateral x-rays were taken and a sterile needle was placed at  the area of the calcific mass.  This took a fair amount of time  to  localize.  An incision was made in this area and then blunt finger  dissection was carried down to this area.  The calcific mass could be  felt and this appeared to be a myositis type of involvement of the  muscle, but there was no loose body.  It was elected not to excise this  mass, as it was very small and poorly defined.  Attention was then  turned back to the shoulder.  The arthroscope was then placed in  subacromial space.  Using an ArthroCare wand, the soft tissue and the  undersurface of the acromion was cleaned off.  Then, a 4-mm bur was  inserted.  A generous subacromial decompression of the acromion was  completed in a standard manner.  Excellent decompression.  The [facility] joint  appeared fairly normal.  On the undersurface of the acromion, it should  be noted that there were bone spurs which were impinging the shoulder  and  these were completely removed.  Bleeding was controlled very nicely.  The incision over the anterior aspect of the proximal humerus was closed  with subcutaneous tissue and Steri-Strips and dry sterile dressing  applied to the shoulder, and the patient returned to recovery room in  excellent condition.  Technically, this procedure went extremely well.   DISPOSITION:  1. To my office on Monday.  2. Usual postoperative instructions.  3. Percocet for pain.  4. We will start immediate physical therapy.      Rodney A. Chaney Malling, M.D.  Electronically Signed     RAM/MEDQ  D:  09/21/2008  T:  09/21/2008  Job:  [id number]

## 2011-03-20 ENCOUNTER — Encounter (HOSPITAL_BASED_OUTPATIENT_CLINIC_OR_DEPARTMENT_OTHER): Payer: BC Managed Care – PPO | Admitting: Oncology

## 2011-03-20 DIAGNOSIS — C787 Secondary malignant neoplasm of liver and intrahepatic bile duct: Secondary | ICD-10-CM

## 2011-03-20 DIAGNOSIS — Z5189 Encounter for other specified aftercare: Secondary | ICD-10-CM

## 2011-03-20 DIAGNOSIS — C185 Malignant neoplasm of splenic flexure: Secondary | ICD-10-CM

## 2011-03-27 ENCOUNTER — Ambulatory Visit (HOSPITAL_COMMUNITY)
Admission: RE | Admit: 2011-03-27 | Discharge: 2011-03-27 | Disposition: A | Discharge status: Home / Self Care | Payer: BC Managed Care – PPO | Source: Ambulatory Visit | Attending: Oncology | Admitting: Oncology

## 2011-03-27 ENCOUNTER — Encounter (HOSPITAL_COMMUNITY): Payer: Self-pay

## 2011-03-27 DIAGNOSIS — C189 Malignant neoplasm of colon, unspecified: Secondary | ICD-10-CM

## 2011-03-27 DIAGNOSIS — C787 Secondary malignant neoplasm of liver and intrahepatic bile duct: Secondary | ICD-10-CM | POA: Insufficient documentation

## 2011-03-27 MED ORDER — IOHEXOL 300 MG/ML  SOLN
100.0000 mL | Freq: Once | INTRAMUSCULAR | Status: AC | PRN
  Administered 2011-03-27: 100 mL via INTRAVENOUS

## 2011-04-02 ENCOUNTER — Other Ambulatory Visit: Payer: Self-pay | Admitting: Oncology

## 2011-04-02 ENCOUNTER — Encounter (HOSPITAL_BASED_OUTPATIENT_CLINIC_OR_DEPARTMENT_OTHER): Payer: BC Managed Care – PPO | Admitting: Oncology

## 2011-04-02 DIAGNOSIS — C184 Malignant neoplasm of transverse colon: Secondary | ICD-10-CM

## 2011-04-02 DIAGNOSIS — Z136 Encounter for screening for cardiovascular disorders: Secondary | ICD-10-CM

## 2011-04-02 DIAGNOSIS — Z5112 Encounter for antineoplastic immunotherapy: Secondary | ICD-10-CM

## 2011-04-02 DIAGNOSIS — Z5111 Encounter for antineoplastic chemotherapy: Secondary | ICD-10-CM

## 2011-04-02 DIAGNOSIS — C787 Secondary malignant neoplasm of liver and intrahepatic bile duct: Secondary | ICD-10-CM

## 2011-04-02 LAB — RESEARCH LABS

## 2011-04-02 LAB — CBC WITH DIFFERENTIAL/PLATELET
Basophils Absolute: 0 10*3/uL (ref 0.0–0.1)
EOS%: 2 % (ref 0.0–7.0)
Eosinophils Absolute: 0.2 10*3/uL (ref 0.0–0.5)
HGB: 11 g/dL — ABNORMAL LOW (ref 11.6–15.9)
MCH: 27.7 pg (ref 25.1–34.0)
MCV: 84.2 fL (ref 79.5–101.0)
MONO%: 3.6 % (ref 0.0–14.0)
NEUT#: 9.8 10*3/uL — ABNORMAL HIGH (ref 1.5–6.5)
RBC: 3.98 10*6/uL (ref 3.70–5.45)
RDW: 24.2 % — ABNORMAL HIGH (ref 11.2–14.5)
lymph#: 1 10*3/uL (ref 0.9–3.3)

## 2011-04-02 LAB — URINALYSIS, MICROSCOPIC - CHCC
Bilirubin (Urine): NEGATIVE
Glucose: NEGATIVE g/dL
Leukocyte Esterase: NEGATIVE
Nitrite: NEGATIVE

## 2011-04-02 LAB — COMPREHENSIVE METABOLIC PANEL
AST: 16 U/L (ref 0–37)
Alkaline Phosphatase: 198 U/L — ABNORMAL HIGH (ref 39–117)
BUN: 12 mg/dL (ref 6–23)
Creatinine, Ser: 0.7 mg/dL (ref 0.40–1.20)
Glucose, Bld: 94 mg/dL (ref 70–99)
Total Bilirubin: 0.3 mg/dL (ref 0.3–1.2)

## 2011-04-04 ENCOUNTER — Encounter (HOSPITAL_BASED_OUTPATIENT_CLINIC_OR_DEPARTMENT_OTHER): Payer: BC Managed Care – PPO | Admitting: Oncology

## 2011-04-04 DIAGNOSIS — Z5189 Encounter for other specified aftercare: Secondary | ICD-10-CM

## 2011-04-04 DIAGNOSIS — C184 Malignant neoplasm of transverse colon: Secondary | ICD-10-CM

## 2011-04-04 DIAGNOSIS — C787 Secondary malignant neoplasm of liver and intrahepatic bile duct: Secondary | ICD-10-CM

## 2011-04-15 ENCOUNTER — Other Ambulatory Visit: Payer: Self-pay | Admitting: Oncology

## 2011-04-15 ENCOUNTER — Encounter (HOSPITAL_BASED_OUTPATIENT_CLINIC_OR_DEPARTMENT_OTHER): Payer: BC Managed Care – PPO | Admitting: Oncology

## 2011-04-15 DIAGNOSIS — C787 Secondary malignant neoplasm of liver and intrahepatic bile duct: Secondary | ICD-10-CM

## 2011-04-15 DIAGNOSIS — Z5111 Encounter for antineoplastic chemotherapy: Secondary | ICD-10-CM

## 2011-04-15 DIAGNOSIS — C184 Malignant neoplasm of transverse colon: Secondary | ICD-10-CM

## 2011-04-15 LAB — CBC WITH DIFFERENTIAL/PLATELET
Basophils Absolute: 0.1 10*3/uL (ref 0.0–0.1)
Eosinophils Absolute: 0.2 10*3/uL (ref 0.0–0.5)
HCT: 33.3 % — ABNORMAL LOW (ref 34.8–46.6)
HGB: 10.9 g/dL — ABNORMAL LOW (ref 11.6–15.9)
MCV: 85.4 fL (ref 79.5–101.0)
MONO%: 3.5 % (ref 0.0–14.0)
NEUT#: 11.1 10*3/uL — ABNORMAL HIGH (ref 1.5–6.5)
NEUT%: 84.9 % — ABNORMAL HIGH (ref 38.4–76.8)
Platelets: 279 10*3/uL (ref 145–400)
RDW: 24.7 % — ABNORMAL HIGH (ref 11.2–14.5)

## 2011-04-15 LAB — URINALYSIS, MICROSCOPIC - CHCC
Bilirubin (Urine): NEGATIVE
Blood: NEGATIVE
Leukocyte Esterase: NEGATIVE
Protein: NEGATIVE mg/dL
pH: 6 (ref 4.6–8.0)

## 2011-04-15 LAB — COMPREHENSIVE METABOLIC PANEL
ALT: 13 U/L (ref 0–35)
AST: 16 U/L (ref 0–37)
Albumin: 3.4 g/dL — ABNORMAL LOW (ref 3.5–5.2)
Alkaline Phosphatase: 178 U/L — ABNORMAL HIGH (ref 39–117)
Potassium: 3.6 mEq/L (ref 3.5–5.3)
Sodium: 137 mEq/L (ref 135–145)
Total Protein: 6.5 g/dL (ref 6.0–8.3)

## 2011-04-17 ENCOUNTER — Encounter (HOSPITAL_BASED_OUTPATIENT_CLINIC_OR_DEPARTMENT_OTHER): Payer: BC Managed Care – PPO | Admitting: Oncology

## 2011-04-17 DIAGNOSIS — C184 Malignant neoplasm of transverse colon: Secondary | ICD-10-CM

## 2011-04-17 DIAGNOSIS — C787 Secondary malignant neoplasm of liver and intrahepatic bile duct: Secondary | ICD-10-CM

## 2011-04-17 DIAGNOSIS — Z5111 Encounter for antineoplastic chemotherapy: Secondary | ICD-10-CM

## 2011-04-29 ENCOUNTER — Other Ambulatory Visit: Payer: Self-pay | Admitting: Oncology

## 2011-04-29 ENCOUNTER — Encounter (HOSPITAL_BASED_OUTPATIENT_CLINIC_OR_DEPARTMENT_OTHER): Payer: BC Managed Care – PPO | Admitting: Oncology

## 2011-04-29 DIAGNOSIS — C184 Malignant neoplasm of transverse colon: Secondary | ICD-10-CM

## 2011-04-29 DIAGNOSIS — Z5111 Encounter for antineoplastic chemotherapy: Secondary | ICD-10-CM

## 2011-04-29 DIAGNOSIS — C787 Secondary malignant neoplasm of liver and intrahepatic bile duct: Secondary | ICD-10-CM

## 2011-04-29 DIAGNOSIS — C189 Malignant neoplasm of colon, unspecified: Secondary | ICD-10-CM

## 2011-04-29 LAB — COMPREHENSIVE METABOLIC PANEL
ALT: 19 U/L (ref 0–35)
AST: 19 U/L (ref 0–37)
Albumin: 3.2 g/dL — ABNORMAL LOW (ref 3.5–5.2)
BUN: 9 mg/dL (ref 6–23)
CO2: 29 mEq/L (ref 19–32)
Calcium: 9.5 mg/dL (ref 8.4–10.5)
Chloride: 101 mEq/L (ref 96–112)
Creatinine, Ser: 0.7 mg/dL (ref 0.50–1.10)
Potassium: 3.8 mEq/L (ref 3.5–5.3)

## 2011-04-29 LAB — CBC WITH DIFFERENTIAL/PLATELET
BASO%: 0.6 % (ref 0.0–2.0)
Basophils Absolute: 0.1 10*3/uL (ref 0.0–0.1)
EOS%: 3.2 % (ref 0.0–7.0)
HCT: 32.3 % — ABNORMAL LOW (ref 34.8–46.6)
HGB: 10.6 g/dL — ABNORMAL LOW (ref 11.6–15.9)
MONO#: 0.5 10*3/uL (ref 0.1–0.9)
NEUT%: 82 % — ABNORMAL HIGH (ref 38.4–76.8)
RDW: 23.7 % — ABNORMAL HIGH (ref 11.2–14.5)
WBC: 9.2 10*3/uL (ref 3.9–10.3)
lymph#: 0.8 10*3/uL — ABNORMAL LOW (ref 0.9–3.3)

## 2011-04-29 LAB — URINALYSIS, MICROSCOPIC - CHCC
Blood: NEGATIVE
pH: 5 (ref 4.6–8.0)

## 2011-04-29 LAB — RESEARCH LABS

## 2011-05-01 ENCOUNTER — Encounter (HOSPITAL_BASED_OUTPATIENT_CLINIC_OR_DEPARTMENT_OTHER): Payer: BC Managed Care – PPO | Admitting: Oncology

## 2011-05-01 DIAGNOSIS — Z5111 Encounter for antineoplastic chemotherapy: Secondary | ICD-10-CM

## 2011-05-01 DIAGNOSIS — C184 Malignant neoplasm of transverse colon: Secondary | ICD-10-CM

## 2011-05-01 DIAGNOSIS — C787 Secondary malignant neoplasm of liver and intrahepatic bile duct: Secondary | ICD-10-CM

## 2011-05-09 ENCOUNTER — Encounter (HOSPITAL_COMMUNITY): Payer: Self-pay

## 2011-05-09 ENCOUNTER — Ambulatory Visit (HOSPITAL_COMMUNITY)
Admission: RE | Admit: 2011-05-09 | Discharge: 2011-05-09 | Disposition: A | Discharge status: Home / Self Care | Payer: BC Managed Care – PPO | Source: Ambulatory Visit | Attending: Oncology | Admitting: Oncology

## 2011-05-09 DIAGNOSIS — C189 Malignant neoplasm of colon, unspecified: Secondary | ICD-10-CM | POA: Insufficient documentation

## 2011-05-09 DIAGNOSIS — C787 Secondary malignant neoplasm of liver and intrahepatic bile duct: Secondary | ICD-10-CM | POA: Insufficient documentation

## 2011-05-09 MED ORDER — IOHEXOL 300 MG/ML  SOLN
80.0000 mL | Freq: Once | INTRAMUSCULAR | Status: AC | PRN
  Administered 2011-05-09: 80 mL via INTRAVENOUS

## 2011-05-13 ENCOUNTER — Encounter (HOSPITAL_BASED_OUTPATIENT_CLINIC_OR_DEPARTMENT_OTHER): Payer: BC Managed Care – PPO | Admitting: Oncology

## 2011-05-13 ENCOUNTER — Other Ambulatory Visit: Payer: Self-pay | Admitting: Oncology

## 2011-05-13 DIAGNOSIS — Z5111 Encounter for antineoplastic chemotherapy: Secondary | ICD-10-CM

## 2011-05-13 DIAGNOSIS — C787 Secondary malignant neoplasm of liver and intrahepatic bile duct: Secondary | ICD-10-CM

## 2011-05-13 DIAGNOSIS — T451X5A Adverse effect of antineoplastic and immunosuppressive drugs, initial encounter: Secondary | ICD-10-CM

## 2011-05-13 DIAGNOSIS — D6481 Anemia due to antineoplastic chemotherapy: Secondary | ICD-10-CM

## 2011-05-13 DIAGNOSIS — C184 Malignant neoplasm of transverse colon: Secondary | ICD-10-CM

## 2011-05-13 LAB — CBC WITH DIFFERENTIAL/PLATELET
BASO%: 0.4 % (ref 0.0–2.0)
Basophils Absolute: 0 10*3/uL (ref 0.0–0.1)
EOS%: 4.1 % (ref 0.0–7.0)
LYMPH%: 9.8 % — ABNORMAL LOW (ref 14.0–49.7)
MCH: 28.3 pg (ref 25.1–34.0)
MCHC: 33.3 g/dL (ref 31.5–36.0)
MCV: 84.9 fL (ref 79.5–101.0)
MONO%: 3 % (ref 0.0–14.0)
NEUT#: 7.7 10*3/uL — ABNORMAL HIGH (ref 1.5–6.5)
RDW: 23.4 % — ABNORMAL HIGH (ref 11.2–14.5)
lymph#: 0.9 10*3/uL (ref 0.9–3.3)

## 2011-05-13 LAB — URINALYSIS, MICROSCOPIC - CHCC
Bilirubin (Urine): NEGATIVE
Leukocyte Esterase: NEGATIVE
Protein: NEGATIVE mg/dL
RBC count: NEGATIVE (ref 0–2)
WBC, UA: NEGATIVE (ref 0–2)
pH: 5 (ref 4.6–8.0)

## 2011-05-13 LAB — COMPREHENSIVE METABOLIC PANEL
Albumin: 3.5 g/dL (ref 3.5–5.2)
BUN: 14 mg/dL (ref 6–23)
Calcium: 9.6 mg/dL (ref 8.4–10.5)
Chloride: 105 mEq/L (ref 96–112)
Creatinine, Ser: 0.8 mg/dL (ref 0.50–1.10)
Glucose, Bld: 97 mg/dL (ref 70–99)
Potassium: 3.7 mEq/L (ref 3.5–5.3)

## 2011-05-15 ENCOUNTER — Encounter (HOSPITAL_BASED_OUTPATIENT_CLINIC_OR_DEPARTMENT_OTHER): Payer: BC Managed Care – PPO | Admitting: Oncology

## 2011-05-15 DIAGNOSIS — D702 Other drug-induced agranulocytosis: Secondary | ICD-10-CM

## 2011-05-27 ENCOUNTER — Other Ambulatory Visit: Payer: Self-pay | Admitting: Oncology

## 2011-05-27 ENCOUNTER — Encounter (HOSPITAL_BASED_OUTPATIENT_CLINIC_OR_DEPARTMENT_OTHER): Payer: BC Managed Care – PPO | Admitting: Oncology

## 2011-05-27 DIAGNOSIS — Z5111 Encounter for antineoplastic chemotherapy: Secondary | ICD-10-CM

## 2011-05-27 DIAGNOSIS — C787 Secondary malignant neoplasm of liver and intrahepatic bile duct: Secondary | ICD-10-CM

## 2011-05-27 DIAGNOSIS — C189 Malignant neoplasm of colon, unspecified: Secondary | ICD-10-CM

## 2011-05-27 DIAGNOSIS — C184 Malignant neoplasm of transverse colon: Secondary | ICD-10-CM

## 2011-05-27 LAB — URINALYSIS, MICROSCOPIC - CHCC
Bilirubin (Urine): NEGATIVE
Blood: NEGATIVE
Glucose: NEGATIVE g/dL
Nitrite: NEGATIVE

## 2011-05-27 LAB — CBC WITH DIFFERENTIAL/PLATELET
BASO%: 0.6 % (ref 0.0–2.0)
EOS%: 2.3 % (ref 0.0–7.0)
Eosinophils Absolute: 0.2 10*3/uL (ref 0.0–0.5)
LYMPH%: 10.7 % — ABNORMAL LOW (ref 14.0–49.7)
MCHC: 32.8 g/dL (ref 31.5–36.0)
MCV: 85.5 fL (ref 79.5–101.0)
MONO%: 2.9 % (ref 0.0–14.0)
NEUT#: 6.5 10*3/uL (ref 1.5–6.5)
RBC: 3.61 10*6/uL — ABNORMAL LOW (ref 3.70–5.45)
RDW: 24 % — ABNORMAL HIGH (ref 11.2–14.5)

## 2011-05-27 LAB — COMPREHENSIVE METABOLIC PANEL
AST: 17 U/L (ref 0–37)
Alkaline Phosphatase: 147 U/L — ABNORMAL HIGH (ref 39–117)
BUN: 8 mg/dL (ref 6–23)
Creatinine, Ser: 0.87 mg/dL (ref 0.50–1.10)
Glucose, Bld: 137 mg/dL — ABNORMAL HIGH (ref 70–99)
Total Bilirubin: 0.3 mg/dL (ref 0.3–1.2)

## 2011-05-29 ENCOUNTER — Encounter (HOSPITAL_BASED_OUTPATIENT_CLINIC_OR_DEPARTMENT_OTHER): Payer: BC Managed Care – PPO | Admitting: Oncology

## 2011-05-29 DIAGNOSIS — Z5189 Encounter for other specified aftercare: Secondary | ICD-10-CM

## 2011-05-29 DIAGNOSIS — C184 Malignant neoplasm of transverse colon: Secondary | ICD-10-CM

## 2011-05-29 DIAGNOSIS — C787 Secondary malignant neoplasm of liver and intrahepatic bile duct: Secondary | ICD-10-CM

## 2011-06-10 ENCOUNTER — Other Ambulatory Visit: Payer: Self-pay | Admitting: Oncology

## 2011-06-10 ENCOUNTER — Encounter (HOSPITAL_BASED_OUTPATIENT_CLINIC_OR_DEPARTMENT_OTHER): Payer: BC Managed Care – PPO | Admitting: Oncology

## 2011-06-10 DIAGNOSIS — C184 Malignant neoplasm of transverse colon: Secondary | ICD-10-CM

## 2011-06-10 DIAGNOSIS — D702 Other drug-induced agranulocytosis: Secondary | ICD-10-CM

## 2011-06-10 DIAGNOSIS — C787 Secondary malignant neoplasm of liver and intrahepatic bile duct: Secondary | ICD-10-CM

## 2011-06-10 DIAGNOSIS — Z5111 Encounter for antineoplastic chemotherapy: Secondary | ICD-10-CM

## 2011-06-10 LAB — CBC WITH DIFFERENTIAL/PLATELET
Basophils Absolute: 0.1 10*3/uL (ref 0.0–0.1)
Eosinophils Absolute: 0.2 10*3/uL (ref 0.0–0.5)
HGB: 10.7 g/dL — ABNORMAL LOW (ref 11.6–15.9)
LYMPH%: 9 % — ABNORMAL LOW (ref 14.0–49.7)
MCV: 84.2 fL (ref 79.5–101.0)
MONO#: 0.3 10*3/uL (ref 0.1–0.9)
MONO%: 3 % (ref 0.0–14.0)
NEUT#: 7.9 10*3/uL — ABNORMAL HIGH (ref 1.5–6.5)
Platelets: 162 10*3/uL (ref 145–400)
RBC: 3.85 10*6/uL (ref 3.70–5.45)
RDW: 24.7 % — ABNORMAL HIGH (ref 11.2–14.5)
WBC: 9.3 10*3/uL (ref 3.9–10.3)

## 2011-06-10 LAB — URINALYSIS, MICROSCOPIC - CHCC
Bilirubin (Urine): NEGATIVE
Glucose: NEGATIVE g/dL
Ketones: NEGATIVE mg/dL
Leukocyte Esterase: NEGATIVE
RBC count: NEGATIVE (ref 0–2)

## 2011-06-10 LAB — COMPREHENSIVE METABOLIC PANEL
ALT: 14 U/L (ref 0–35)
AST: 18 U/L (ref 0–37)
Alkaline Phosphatase: 160 U/L — ABNORMAL HIGH (ref 39–117)
CO2: 28 mEq/L (ref 19–32)
Creatinine, Ser: 0.71 mg/dL (ref 0.50–1.10)
Sodium: 142 mEq/L (ref 135–145)
Total Bilirubin: 0.3 mg/dL (ref 0.3–1.2)
Total Protein: 6.6 g/dL (ref 6.0–8.3)

## 2011-06-12 ENCOUNTER — Encounter (HOSPITAL_BASED_OUTPATIENT_CLINIC_OR_DEPARTMENT_OTHER): Payer: BC Managed Care – PPO | Admitting: Oncology

## 2011-06-12 DIAGNOSIS — D702 Other drug-induced agranulocytosis: Secondary | ICD-10-CM

## 2011-06-20 ENCOUNTER — Encounter (HOSPITAL_COMMUNITY): Payer: Self-pay

## 2011-06-20 ENCOUNTER — Ambulatory Visit (HOSPITAL_COMMUNITY)
Admission: RE | Admit: 2011-06-20 | Discharge: 2011-06-20 | Disposition: A | Discharge status: Home / Self Care | Payer: BC Managed Care – PPO | Source: Ambulatory Visit | Attending: Oncology | Admitting: Oncology

## 2011-06-20 DIAGNOSIS — C787 Secondary malignant neoplasm of liver and intrahepatic bile duct: Secondary | ICD-10-CM | POA: Insufficient documentation

## 2011-06-20 DIAGNOSIS — K5900 Constipation, unspecified: Secondary | ICD-10-CM | POA: Insufficient documentation

## 2011-06-20 DIAGNOSIS — R1084 Generalized abdominal pain: Secondary | ICD-10-CM | POA: Insufficient documentation

## 2011-06-20 DIAGNOSIS — Z79899 Other long term (current) drug therapy: Secondary | ICD-10-CM | POA: Insufficient documentation

## 2011-06-20 DIAGNOSIS — R197 Diarrhea, unspecified: Secondary | ICD-10-CM | POA: Insufficient documentation

## 2011-06-20 DIAGNOSIS — C189 Malignant neoplasm of colon, unspecified: Secondary | ICD-10-CM | POA: Insufficient documentation

## 2011-06-20 MED ORDER — IOHEXOL 300 MG/ML  SOLN
100.0000 mL | Freq: Once | INTRAMUSCULAR | Status: AC | PRN
  Administered 2011-06-20: 100 mL via INTRAVENOUS

## 2011-06-24 ENCOUNTER — Other Ambulatory Visit: Payer: Self-pay | Admitting: Oncology

## 2011-06-24 ENCOUNTER — Encounter (HOSPITAL_BASED_OUTPATIENT_CLINIC_OR_DEPARTMENT_OTHER): Payer: BC Managed Care – PPO | Admitting: Oncology

## 2011-06-24 DIAGNOSIS — Z5111 Encounter for antineoplastic chemotherapy: Secondary | ICD-10-CM

## 2011-06-24 DIAGNOSIS — C184 Malignant neoplasm of transverse colon: Secondary | ICD-10-CM

## 2011-06-24 DIAGNOSIS — C787 Secondary malignant neoplasm of liver and intrahepatic bile duct: Secondary | ICD-10-CM

## 2011-06-24 DIAGNOSIS — Z5112 Encounter for antineoplastic immunotherapy: Secondary | ICD-10-CM

## 2011-06-24 LAB — CBC WITH DIFFERENTIAL/PLATELET
BASO%: 0.6 % (ref 0.0–2.0)
Eosinophils Absolute: 0.2 10*3/uL (ref 0.0–0.5)
HCT: 31.4 % — ABNORMAL LOW (ref 34.8–46.6)
LYMPH%: 8.8 % — ABNORMAL LOW (ref 14.0–49.7)
MCHC: 33.1 g/dL (ref 31.5–36.0)
MCV: 83.4 fL (ref 79.5–101.0)
MONO#: 0.5 10*3/uL (ref 0.1–0.9)
MONO%: 5.3 % (ref 0.0–14.0)
NEUT%: 82.7 % — ABNORMAL HIGH (ref 38.4–76.8)
Platelets: 223 10*3/uL (ref 145–400)
WBC: 8.8 10*3/uL (ref 3.9–10.3)

## 2011-06-24 LAB — COMPREHENSIVE METABOLIC PANEL
Alkaline Phosphatase: 176 U/L — ABNORMAL HIGH (ref 39–117)
BUN: 11 mg/dL (ref 6–23)
Glucose, Bld: 85 mg/dL (ref 70–99)
Total Bilirubin: 0.2 mg/dL — ABNORMAL LOW (ref 0.3–1.2)

## 2011-06-24 LAB — URINALYSIS, MICROSCOPIC - CHCC
Glucose: NEGATIVE g/dL
Leukocyte Esterase: NEGATIVE
Nitrite: NEGATIVE
Specific Gravity, Urine: 1.03 (ref 1.003–1.035)

## 2011-06-26 ENCOUNTER — Encounter (HOSPITAL_BASED_OUTPATIENT_CLINIC_OR_DEPARTMENT_OTHER): Payer: BC Managed Care – PPO | Admitting: Oncology

## 2011-06-26 DIAGNOSIS — C787 Secondary malignant neoplasm of liver and intrahepatic bile duct: Secondary | ICD-10-CM

## 2011-06-26 DIAGNOSIS — Z5189 Encounter for other specified aftercare: Secondary | ICD-10-CM

## 2011-06-26 DIAGNOSIS — C184 Malignant neoplasm of transverse colon: Secondary | ICD-10-CM

## 2011-07-08 ENCOUNTER — Encounter (HOSPITAL_BASED_OUTPATIENT_CLINIC_OR_DEPARTMENT_OTHER): Payer: BC Managed Care – PPO | Admitting: Oncology

## 2011-07-08 ENCOUNTER — Other Ambulatory Visit: Payer: Self-pay | Admitting: Oncology

## 2011-07-08 DIAGNOSIS — Z5189 Encounter for other specified aftercare: Secondary | ICD-10-CM

## 2011-07-08 DIAGNOSIS — C189 Malignant neoplasm of colon, unspecified: Secondary | ICD-10-CM

## 2011-07-08 DIAGNOSIS — Z5111 Encounter for antineoplastic chemotherapy: Secondary | ICD-10-CM

## 2011-07-08 DIAGNOSIS — C787 Secondary malignant neoplasm of liver and intrahepatic bile duct: Secondary | ICD-10-CM

## 2011-07-08 DIAGNOSIS — C229 Malignant neoplasm of liver, not specified as primary or secondary: Secondary | ICD-10-CM

## 2011-07-08 DIAGNOSIS — C184 Malignant neoplasm of transverse colon: Secondary | ICD-10-CM

## 2011-07-08 LAB — COMPREHENSIVE METABOLIC PANEL
Albumin: 3.1 g/dL — ABNORMAL LOW (ref 3.5–5.2)
Alkaline Phosphatase: 186 U/L — ABNORMAL HIGH (ref 39–117)
BUN: 9 mg/dL (ref 6–23)
Glucose, Bld: 130 mg/dL — ABNORMAL HIGH (ref 70–99)
Potassium: 4.3 mEq/L (ref 3.5–5.3)

## 2011-07-08 LAB — URINALYSIS, MICROSCOPIC - CHCC
Bilirubin (Urine): NEGATIVE
Glucose: NEGATIVE g/dL
Ketones: NEGATIVE mg/dL
pH: 6 (ref 4.6–8.0)

## 2011-07-08 LAB — CBC WITH DIFFERENTIAL/PLATELET
Basophils Absolute: 0.1 10*3/uL (ref 0.0–0.1)
Eosinophils Absolute: 0.1 10*3/uL (ref 0.0–0.5)
HGB: 10.3 g/dL — ABNORMAL LOW (ref 11.6–15.9)
LYMPH%: 8.3 % — ABNORMAL LOW (ref 14.0–49.7)
MCV: 83.3 fL (ref 79.5–101.0)
MONO%: 7.6 % (ref 0.0–14.0)
NEUT#: 6.1 10*3/uL (ref 1.5–6.5)
Platelets: 172 10*3/uL (ref 145–400)

## 2011-07-10 ENCOUNTER — Encounter (HOSPITAL_BASED_OUTPATIENT_CLINIC_OR_DEPARTMENT_OTHER): Payer: BC Managed Care – PPO | Admitting: Oncology

## 2011-07-10 DIAGNOSIS — Z5189 Encounter for other specified aftercare: Secondary | ICD-10-CM

## 2011-07-10 DIAGNOSIS — C184 Malignant neoplasm of transverse colon: Secondary | ICD-10-CM

## 2011-07-10 DIAGNOSIS — C787 Secondary malignant neoplasm of liver and intrahepatic bile duct: Secondary | ICD-10-CM

## 2011-07-22 ENCOUNTER — Other Ambulatory Visit: Payer: Self-pay | Admitting: Oncology

## 2011-07-22 ENCOUNTER — Encounter (HOSPITAL_BASED_OUTPATIENT_CLINIC_OR_DEPARTMENT_OTHER): Payer: BC Managed Care – PPO | Admitting: Oncology

## 2011-07-22 DIAGNOSIS — D709 Neutropenia, unspecified: Secondary | ICD-10-CM

## 2011-07-22 DIAGNOSIS — C787 Secondary malignant neoplasm of liver and intrahepatic bile duct: Secondary | ICD-10-CM

## 2011-07-22 DIAGNOSIS — Z5111 Encounter for antineoplastic chemotherapy: Secondary | ICD-10-CM

## 2011-07-22 DIAGNOSIS — C184 Malignant neoplasm of transverse colon: Secondary | ICD-10-CM

## 2011-07-22 DIAGNOSIS — T451X5A Adverse effect of antineoplastic and immunosuppressive drugs, initial encounter: Secondary | ICD-10-CM

## 2011-07-22 LAB — CBC WITH DIFFERENTIAL/PLATELET
Basophils Absolute: 0 10*3/uL (ref 0.0–0.1)
Eosinophils Absolute: 0.1 10*3/uL (ref 0.0–0.5)
HCT: 33.5 % — ABNORMAL LOW (ref 34.8–46.6)
HGB: 11.1 g/dL — ABNORMAL LOW (ref 11.6–15.9)
LYMPH%: 6.7 % — ABNORMAL LOW (ref 14.0–49.7)
MONO#: 0.3 10*3/uL (ref 0.1–0.9)
NEUT%: 89.2 % — ABNORMAL HIGH (ref 38.4–76.8)
Platelets: 176 10*3/uL (ref 145–400)
WBC: 10.1 10*3/uL (ref 3.9–10.3)
lymph#: 0.7 10*3/uL — ABNORMAL LOW (ref 0.9–3.3)

## 2011-07-22 LAB — COMPREHENSIVE METABOLIC PANEL
Albumin: 3.2 g/dL — ABNORMAL LOW (ref 3.5–5.2)
Alkaline Phosphatase: 213 U/L — ABNORMAL HIGH (ref 39–117)
CO2: 27 mEq/L (ref 19–32)
Calcium: 9.5 mg/dL (ref 8.4–10.5)
Chloride: 100 mEq/L (ref 96–112)
Glucose, Bld: 147 mg/dL — ABNORMAL HIGH (ref 70–99)
Potassium: 3.9 mEq/L (ref 3.5–5.3)
Sodium: 136 mEq/L (ref 135–145)
Total Protein: 6.8 g/dL (ref 6.0–8.3)

## 2011-07-22 LAB — URINALYSIS, MICROSCOPIC - CHCC
Blood: NEGATIVE
Ketones: NEGATIVE mg/dL
Protein: NEGATIVE mg/dL
Specific Gravity, Urine: 1.03 (ref 1.003–1.035)
pH: 6 (ref 4.6–8.0)

## 2011-07-22 LAB — RESEARCH LABS

## 2011-07-24 ENCOUNTER — Encounter (HOSPITAL_BASED_OUTPATIENT_CLINIC_OR_DEPARTMENT_OTHER): Payer: BC Managed Care – PPO | Admitting: Oncology

## 2011-07-24 DIAGNOSIS — C184 Malignant neoplasm of transverse colon: Secondary | ICD-10-CM

## 2011-07-24 DIAGNOSIS — Z5189 Encounter for other specified aftercare: Secondary | ICD-10-CM

## 2011-07-24 DIAGNOSIS — C787 Secondary malignant neoplasm of liver and intrahepatic bile duct: Secondary | ICD-10-CM

## 2011-08-01 ENCOUNTER — Encounter (HOSPITAL_COMMUNITY): Payer: Self-pay

## 2011-08-01 ENCOUNTER — Ambulatory Visit (HOSPITAL_COMMUNITY)
Admission: RE | Admit: 2011-08-01 | Discharge: 2011-08-01 | Disposition: A | Discharge status: Home / Self Care | Payer: BC Managed Care – PPO | Source: Ambulatory Visit | Attending: Oncology | Admitting: Oncology

## 2011-08-01 ENCOUNTER — Other Ambulatory Visit (HOSPITAL_COMMUNITY): Payer: BC Managed Care – PPO

## 2011-08-01 DIAGNOSIS — Z85038 Personal history of other malignant neoplasm of large intestine: Secondary | ICD-10-CM | POA: Insufficient documentation

## 2011-08-01 DIAGNOSIS — C229 Malignant neoplasm of liver, not specified as primary or secondary: Secondary | ICD-10-CM

## 2011-08-01 DIAGNOSIS — C787 Secondary malignant neoplasm of liver and intrahepatic bile duct: Secondary | ICD-10-CM | POA: Insufficient documentation

## 2011-08-01 DIAGNOSIS — C189 Malignant neoplasm of colon, unspecified: Secondary | ICD-10-CM

## 2011-08-04 ENCOUNTER — Other Ambulatory Visit: Payer: Self-pay | Admitting: Oncology

## 2011-08-04 ENCOUNTER — Encounter (HOSPITAL_BASED_OUTPATIENT_CLINIC_OR_DEPARTMENT_OTHER): Payer: BC Managed Care – PPO | Admitting: Oncology

## 2011-08-04 DIAGNOSIS — Z5111 Encounter for antineoplastic chemotherapy: Secondary | ICD-10-CM

## 2011-08-04 DIAGNOSIS — C787 Secondary malignant neoplasm of liver and intrahepatic bile duct: Secondary | ICD-10-CM

## 2011-08-04 DIAGNOSIS — C184 Malignant neoplasm of transverse colon: Secondary | ICD-10-CM

## 2011-08-04 DIAGNOSIS — Z5189 Encounter for other specified aftercare: Secondary | ICD-10-CM

## 2011-08-04 LAB — URINALYSIS, MICROSCOPIC - CHCC
Bilirubin (Urine): NEGATIVE
Blood: NEGATIVE
Ketones: NEGATIVE mg/dL
RBC count: NEGATIVE (ref 0–2)
Specific Gravity, Urine: 1.02 (ref 1.003–1.035)
pH: 6.5 (ref 4.6–8.0)

## 2011-08-04 LAB — CBC WITH DIFFERENTIAL/PLATELET
Basophils Absolute: 0 10*3/uL (ref 0.0–0.1)
EOS%: 1.2 % (ref 0.0–7.0)
Eosinophils Absolute: 0.1 10*3/uL (ref 0.0–0.5)
HGB: 10.9 g/dL — ABNORMAL LOW (ref 11.6–15.9)
MONO#: 0.6 10*3/uL (ref 0.1–0.9)
NEUT#: 8.3 10*3/uL — ABNORMAL HIGH (ref 1.5–6.5)
RDW: 24.5 % — ABNORMAL HIGH (ref 11.2–14.5)
lymph#: 0.4 10*3/uL — ABNORMAL LOW (ref 0.9–3.3)

## 2011-08-04 LAB — COMPREHENSIVE METABOLIC PANEL
ALT: 15 U/L (ref 0–35)
CO2: 27 mEq/L (ref 19–32)
Sodium: 137 mEq/L (ref 135–145)
Total Bilirubin: 0.3 mg/dL (ref 0.3–1.2)
Total Protein: 6.7 g/dL (ref 6.0–8.3)

## 2011-08-05 ENCOUNTER — Encounter (HOSPITAL_BASED_OUTPATIENT_CLINIC_OR_DEPARTMENT_OTHER): Payer: BC Managed Care – PPO | Admitting: Oncology

## 2011-08-05 DIAGNOSIS — C184 Malignant neoplasm of transverse colon: Secondary | ICD-10-CM

## 2011-08-05 DIAGNOSIS — Z5111 Encounter for antineoplastic chemotherapy: Secondary | ICD-10-CM

## 2011-08-05 DIAGNOSIS — C787 Secondary malignant neoplasm of liver and intrahepatic bile duct: Secondary | ICD-10-CM

## 2011-08-05 LAB — POCT HEMOGLOBIN-HEMACUE: Hemoglobin: 9.5 — ABNORMAL LOW

## 2011-08-07 ENCOUNTER — Encounter (HOSPITAL_BASED_OUTPATIENT_CLINIC_OR_DEPARTMENT_OTHER): Payer: BC Managed Care – PPO | Admitting: Oncology

## 2011-08-07 DIAGNOSIS — Z5189 Encounter for other specified aftercare: Secondary | ICD-10-CM

## 2011-08-07 DIAGNOSIS — C184 Malignant neoplasm of transverse colon: Secondary | ICD-10-CM

## 2011-08-07 DIAGNOSIS — C787 Secondary malignant neoplasm of liver and intrahepatic bile duct: Secondary | ICD-10-CM

## 2011-08-19 ENCOUNTER — Other Ambulatory Visit: Payer: Self-pay | Admitting: Oncology

## 2011-08-19 ENCOUNTER — Encounter (HOSPITAL_BASED_OUTPATIENT_CLINIC_OR_DEPARTMENT_OTHER): Payer: BC Managed Care – PPO | Admitting: Oncology

## 2011-08-19 DIAGNOSIS — Z23 Encounter for immunization: Secondary | ICD-10-CM

## 2011-08-19 DIAGNOSIS — C787 Secondary malignant neoplasm of liver and intrahepatic bile duct: Secondary | ICD-10-CM

## 2011-08-19 DIAGNOSIS — Z5189 Encounter for other specified aftercare: Secondary | ICD-10-CM

## 2011-08-19 DIAGNOSIS — C184 Malignant neoplasm of transverse colon: Secondary | ICD-10-CM

## 2011-08-19 DIAGNOSIS — Z5111 Encounter for antineoplastic chemotherapy: Secondary | ICD-10-CM

## 2011-08-19 LAB — CBC WITH DIFFERENTIAL/PLATELET
BASO%: 0.8 % (ref 0.0–2.0)
EOS%: 1.5 % (ref 0.0–7.0)
HCT: 32.1 % — ABNORMAL LOW (ref 34.8–46.6)
LYMPH%: 8.6 % — ABNORMAL LOW (ref 14.0–49.7)
MCH: 26.4 pg (ref 25.1–34.0)
MCHC: 32.2 g/dL (ref 31.5–36.0)
MONO%: 3.9 % (ref 0.0–14.0)
NEUT%: 85.2 % — ABNORMAL HIGH (ref 38.4–76.8)
Platelets: 185 10*3/uL (ref 145–400)
lymph#: 0.8 10*3/uL — ABNORMAL LOW (ref 0.9–3.3)

## 2011-08-19 LAB — COMPREHENSIVE METABOLIC PANEL
AST: 21 U/L (ref 0–37)
Albumin: 3.1 g/dL — ABNORMAL LOW (ref 3.5–5.2)
Alkaline Phosphatase: 231 U/L — ABNORMAL HIGH (ref 39–117)
BUN: 14 mg/dL (ref 6–23)
Potassium: 3.8 mEq/L (ref 3.5–5.3)

## 2011-08-19 LAB — URINALYSIS, MICROSCOPIC - CHCC
Bilirubin (Urine): NEGATIVE
Blood: NEGATIVE
Glucose: NEGATIVE g/dL
Leukocyte Esterase: NEGATIVE
Specific Gravity, Urine: 1.03 (ref 1.003–1.035)

## 2011-08-20 ENCOUNTER — Encounter: Payer: Self-pay | Admitting: *Deleted

## 2011-08-21 ENCOUNTER — Encounter (HOSPITAL_BASED_OUTPATIENT_CLINIC_OR_DEPARTMENT_OTHER): Payer: BC Managed Care – PPO | Admitting: Oncology

## 2011-08-21 DIAGNOSIS — Z5189 Encounter for other specified aftercare: Secondary | ICD-10-CM

## 2011-08-21 DIAGNOSIS — C184 Malignant neoplasm of transverse colon: Secondary | ICD-10-CM

## 2011-08-21 DIAGNOSIS — C787 Secondary malignant neoplasm of liver and intrahepatic bile duct: Secondary | ICD-10-CM

## 2011-08-30 ENCOUNTER — Encounter: Payer: Self-pay | Admitting: *Deleted

## 2011-09-02 ENCOUNTER — Other Ambulatory Visit: Payer: Self-pay | Admitting: Oncology

## 2011-09-02 ENCOUNTER — Encounter (HOSPITAL_BASED_OUTPATIENT_CLINIC_OR_DEPARTMENT_OTHER): Payer: BC Managed Care – PPO | Admitting: Oncology

## 2011-09-02 ENCOUNTER — Encounter: Payer: Self-pay | Admitting: *Deleted

## 2011-09-02 DIAGNOSIS — D702 Other drug-induced agranulocytosis: Secondary | ICD-10-CM

## 2011-09-02 DIAGNOSIS — Z5189 Encounter for other specified aftercare: Secondary | ICD-10-CM

## 2011-09-02 DIAGNOSIS — C189 Malignant neoplasm of colon, unspecified: Secondary | ICD-10-CM

## 2011-09-02 DIAGNOSIS — C787 Secondary malignant neoplasm of liver and intrahepatic bile duct: Secondary | ICD-10-CM

## 2011-09-02 DIAGNOSIS — Z5111 Encounter for antineoplastic chemotherapy: Secondary | ICD-10-CM

## 2011-09-02 DIAGNOSIS — C184 Malignant neoplasm of transverse colon: Secondary | ICD-10-CM

## 2011-09-02 LAB — CBC WITH DIFFERENTIAL/PLATELET
Basophils Absolute: 0.1 10*3/uL (ref 0.0–0.1)
Eosinophils Absolute: 0.2 10*3/uL (ref 0.0–0.5)
HCT: 33.8 % — ABNORMAL LOW (ref 34.8–46.6)
HGB: 10.4 g/dL — ABNORMAL LOW (ref 11.6–15.9)
LYMPH%: 13.2 % — ABNORMAL LOW (ref 14.0–49.7)
MCV: 82.6 fL (ref 79.5–101.0)
MONO#: 0.5 10*3/uL (ref 0.1–0.9)
NEUT#: 4.3 10*3/uL (ref 1.5–6.5)
Platelets: 189 10*3/uL (ref 145–400)
RBC: 4.09 10*6/uL (ref 3.70–5.45)
WBC: 5.8 10*3/uL (ref 3.9–10.3)
nRBC: 1 % — ABNORMAL HIGH (ref 0–0)

## 2011-09-02 LAB — COMPREHENSIVE METABOLIC PANEL
AST: 48 U/L — ABNORMAL HIGH (ref 0–37)
Albumin: 3.2 g/dL — ABNORMAL LOW (ref 3.5–5.2)
Alkaline Phosphatase: 230 U/L — ABNORMAL HIGH (ref 39–117)
BUN: 14 mg/dL (ref 6–23)
Calcium: 9.6 mg/dL (ref 8.4–10.5)
Chloride: 103 mEq/L (ref 96–112)
Glucose, Bld: 95 mg/dL (ref 70–99)
Potassium: 3.9 mEq/L (ref 3.5–5.3)
Sodium: 139 mEq/L (ref 135–145)
Total Protein: 6.6 g/dL (ref 6.0–8.3)

## 2011-09-02 LAB — URINALYSIS, MICROSCOPIC - CHCC
Ketones: NEGATIVE mg/dL
Nitrite: NEGATIVE
Protein: NEGATIVE mg/dL
RBC count: NEGATIVE (ref 0–2)
pH: 5 (ref 4.6–8.0)

## 2011-09-04 ENCOUNTER — Encounter (HOSPITAL_BASED_OUTPATIENT_CLINIC_OR_DEPARTMENT_OTHER): Payer: BC Managed Care – PPO | Admitting: Oncology

## 2011-09-04 DIAGNOSIS — C787 Secondary malignant neoplasm of liver and intrahepatic bile duct: Secondary | ICD-10-CM

## 2011-09-04 DIAGNOSIS — C184 Malignant neoplasm of transverse colon: Secondary | ICD-10-CM

## 2011-09-10 ENCOUNTER — Other Ambulatory Visit: Payer: Self-pay | Admitting: *Deleted

## 2011-09-10 ENCOUNTER — Encounter: Payer: Self-pay | Admitting: *Deleted

## 2011-09-10 DIAGNOSIS — C189 Malignant neoplasm of colon, unspecified: Secondary | ICD-10-CM | POA: Insufficient documentation

## 2011-09-12 ENCOUNTER — Ambulatory Visit (HOSPITAL_COMMUNITY)
Admission: RE | Admit: 2011-09-12 | Discharge: 2011-09-12 | Disposition: A | Discharge status: Home / Self Care | Payer: BC Managed Care – PPO | Source: Ambulatory Visit | Attending: Oncology | Admitting: Oncology

## 2011-09-12 ENCOUNTER — Other Ambulatory Visit: Payer: Self-pay | Admitting: *Deleted

## 2011-09-12 DIAGNOSIS — C801 Malignant (primary) neoplasm, unspecified: Secondary | ICD-10-CM | POA: Insufficient documentation

## 2011-09-12 DIAGNOSIS — K7689 Other specified diseases of liver: Secondary | ICD-10-CM | POA: Insufficient documentation

## 2011-09-12 DIAGNOSIS — C189 Malignant neoplasm of colon, unspecified: Secondary | ICD-10-CM

## 2011-09-12 MED ORDER — IOHEXOL 300 MG/ML  SOLN
100.0000 mL | Freq: Once | INTRAMUSCULAR | Status: AC | PRN
  Administered 2011-09-12: 100 mL via INTRAVENOUS

## 2011-09-14 ENCOUNTER — Other Ambulatory Visit: Payer: Self-pay | Admitting: Oncology

## 2011-09-16 ENCOUNTER — Other Ambulatory Visit: Payer: Self-pay | Admitting: Oncology

## 2011-09-16 ENCOUNTER — Telehealth: Payer: Self-pay | Admitting: Oncology

## 2011-09-16 ENCOUNTER — Encounter: Payer: Self-pay | Admitting: *Deleted

## 2011-09-16 ENCOUNTER — Ambulatory Visit (HOSPITAL_BASED_OUTPATIENT_CLINIC_OR_DEPARTMENT_OTHER): Payer: BC Managed Care – PPO

## 2011-09-16 ENCOUNTER — Other Ambulatory Visit (HOSPITAL_BASED_OUTPATIENT_CLINIC_OR_DEPARTMENT_OTHER): Payer: BC Managed Care – PPO

## 2011-09-16 ENCOUNTER — Ambulatory Visit (HOSPITAL_BASED_OUTPATIENT_CLINIC_OR_DEPARTMENT_OTHER): Payer: BC Managed Care – PPO | Admitting: Oncology

## 2011-09-16 ENCOUNTER — Other Ambulatory Visit: Payer: BC Managed Care – PPO | Admitting: Lab

## 2011-09-16 ENCOUNTER — Encounter: Payer: Self-pay | Admitting: Oncology

## 2011-09-16 VITALS — BP 120/69 | HR 70 | Temp 96.9°F | Ht 68.0 in | Wt 135.9 lb

## 2011-09-16 DIAGNOSIS — C787 Secondary malignant neoplasm of liver and intrahepatic bile duct: Secondary | ICD-10-CM

## 2011-09-16 DIAGNOSIS — C184 Malignant neoplasm of transverse colon: Secondary | ICD-10-CM

## 2011-09-16 DIAGNOSIS — C189 Malignant neoplasm of colon, unspecified: Secondary | ICD-10-CM

## 2011-09-16 DIAGNOSIS — Z5111 Encounter for antineoplastic chemotherapy: Secondary | ICD-10-CM

## 2011-09-16 DIAGNOSIS — R110 Nausea: Secondary | ICD-10-CM

## 2011-09-16 DIAGNOSIS — Z5189 Encounter for other specified aftercare: Secondary | ICD-10-CM

## 2011-09-16 LAB — COMPREHENSIVE METABOLIC PANEL
ALT: 19 U/L (ref 0–35)
AST: 19 U/L (ref 0–37)
Alkaline Phosphatase: 175 U/L — ABNORMAL HIGH (ref 39–117)
Calcium: 9.5 mg/dL (ref 8.4–10.5)
Chloride: 101 mEq/L (ref 96–112)
Creatinine, Ser: 0.73 mg/dL (ref 0.50–1.10)

## 2011-09-16 LAB — CBC WITH DIFFERENTIAL/PLATELET
Eosinophils Absolute: 0.1 10*3/uL (ref 0.0–0.5)
MONO#: 0.3 10*3/uL (ref 0.1–0.9)
MONO%: 5.1 % (ref 0.0–14.0)
NEUT#: 5.4 10*3/uL (ref 1.5–6.5)
RBC: 4.15 10*6/uL (ref 3.70–5.45)
RDW: 24.8 % — ABNORMAL HIGH (ref 11.2–14.5)
WBC: 6.5 10*3/uL (ref 3.9–10.3)

## 2011-09-16 LAB — URINALYSIS, MICROSCOPIC - CHCC
Bilirubin (Urine): NEGATIVE
Blood: NEGATIVE
Glucose: NEGATIVE g/dL
Specific Gravity, Urine: 1.02 (ref 1.003–1.035)

## 2011-09-16 MED ORDER — DEXTROSE 5 % IV SOLN
150.0000 mg/m2 | Freq: Once | INTRAVENOUS | Status: AC
  Administered 2011-09-16: 258 mg via INTRAVENOUS
  Filled 2011-09-16: qty 12.9

## 2011-09-16 MED ORDER — SODIUM CHLORIDE 0.9 % IV SOLN
INTRAVENOUS | Status: DC
  Administered 2011-09-16: 12:00:00 via INTRAVENOUS

## 2011-09-16 MED ORDER — SODIUM CHLORIDE 0.9 % IV SOLN
1920.0000 mg/m2 | INTRAVENOUS | Status: DC
  Administered 2011-09-16: 3300 mg via INTRAVENOUS
  Filled 2011-09-16: qty 66

## 2011-09-16 MED ORDER — SODIUM CHLORIDE 0.9 % IJ SOLN
10.0000 mL | INTRAMUSCULAR | Status: DC | PRN
  Filled 2011-09-16: qty 10

## 2011-09-16 MED ORDER — SODIUM CHLORIDE 0.9 % IV SOLN: 5.2000 mg/kg | Freq: Once | INTRAVENOUS | Status: DC

## 2011-09-16 MED ORDER — LEUCOVORIN CALCIUM INJECTION 350 MG
400.0000 mg/m2 | Freq: Once | INTRAVENOUS | Status: AC
  Administered 2011-09-16: 688 mg via INTRAVENOUS
  Filled 2011-09-16: qty 34.4

## 2011-09-16 MED ORDER — PALONOSETRON HCL INJECTION 0.25 MG/5ML
0.2500 mg | Freq: Once | INTRAVENOUS | Status: AC
  Administered 2011-09-16: 0.25 mg via INTRAVENOUS

## 2011-09-16 MED ORDER — FLUOROURACIL CHEMO INJECTION 2.5 GM/50ML
320.0000 mg/m2 | Freq: Once | INTRAVENOUS | Status: AC
  Administered 2011-09-16: 550 mg via INTRAVENOUS
  Filled 2011-09-16: qty 11

## 2011-09-16 MED ORDER — SODIUM CHLORIDE 0.9 % IV SOLN: Freq: Once | INTRAVENOUS | Status: DC

## 2011-09-16 MED ORDER — DEXAMETHASONE SODIUM PHOSPHATE 4 MG/ML IJ SOLN
20.0000 mg | Freq: Once | INTRAMUSCULAR | Status: AC
Start: 2011-09-16 — End: 2011-09-16
  Administered 2011-09-16: 20 mg via INTRAVENOUS

## 2011-09-16 MED ORDER — SODIUM CHLORIDE 0.9 % IV SOLN
5.0000 mg/kg | Freq: Once | INTRAVENOUS | Status: AC
  Administered 2011-09-16: 325 mg via INTRAVENOUS
  Filled 2011-09-16: qty 13

## 2011-09-16 NOTE — Progress Notes (Signed)
OFFICE PROGRESS NOTE   INTERVAL HISTORY:   Natalie Mccarthy returns as scheduled. She completed another cycle of FOLFOX/Avastin on October 30. She reports a good appetite and energy level. She had a few episodes of diarrhea following chemotherapy. She continues to have a fever several days after each cycle of chemotherapy  Objective:  Vital signs in last 24 hours:  Blood pressure 120/69, pulse 70, temperature 96.9 F (36.1 C), temperature source Oral, height 5\' 8"  (1.727 m), weight 135 lb 14.4 oz (61.644 kg).    HEENT: No thrush or ulcers Resp: Lungs clear bilaterally Cardio: Regular rate and rhythm GI: Mild tenderness in the right subcostal region. No hepatomegaly. No mass Vascular: No leg edema   Portacath/PICC-without erythema  Lab Results:  CBC  Lab Results  Component Value Date   WBC 8.4 12/10/2010   HGB 11.0* 09/16/2011   HCT 33.9* 09/16/2011   MCV 81.8 09/16/2011   PLT 143* 09/16/2011    Chemistry:      Component Value Date/Time   NA 136 09/16/2011 0910   K 4.1 09/16/2011 0910   CL 101 09/16/2011 0910   CO2 28 09/16/2011 0910   GLUCOSE 117* 09/16/2011 0910   BUN 12 09/16/2011 0910   CREATININE 0.73 09/16/2011 0910   CALCIUM 9.5 09/16/2011 0910   PROT 6.5 09/16/2011 0910   ALBUMIN 3.2* 09/16/2011 0910   AST 19 09/16/2011 0910   ALT 19 09/16/2011 0910   ALKPHOS 175* 09/16/2011 0910   BILITOT 0.2* 09/16/2011 0910      Studies/Results: A restaging CT of the abdomen on 09/12/2011 reveals stable liver lesions. No new lesions   Medications: I have reviewed the patient's current medications.  Assessment/Plan: 1. Metastatic colon cancer with multiple liver metastases noted on an MRI of the abdomen 12/02/2010 and on a CT 12/13/2010.  A transverse colon mass was noted on the CT 12/13/2010 and confirmed on a colonoscopy 12/17/2010.  She began treatment with FOLFIRI/Avastin on 12/31/2010.  Restaging CT on 06/20/2011 confirmed further improvement in the metastatic  liver lesions and no evidence for progressive metastatic disease.  Restaging CT on 08/01/2011 showed stable liver lesions and no new lesions. A restaging CT on 09/12/2011 showed stable liver lesions and no new lesions. 2. Abdominal pain secondary to hepatomegaly/liver metastases, resolved. 3. Anorexia, early satiety and weight loss secondary to metastatic colon cancer, resolved. 4. Microcytic anemia, improved. 5. History of migraine headaches. 6. Status post uterine fibroid surgery. 7. Port-A-Cath placement 12/20/2010. 8. Delayed nausea following cycle 1 of FOLFIRI/Avastin, improved with the addition of Aloxi and prophylactic Decadron beginning with cycle #2. 9. Neutropenia secondary to chemotherapy.  Cycle #4 was held on 02/11/2011.  She subsequently has received Neulasta support. 10. Intermittent fever with no apparent source for infection, likely "tumor fever" or fever related to chemotherapy.  She continues to have a low-grade fever on 1 evening approximately 1 week after each cycle of chemotherapy. 11. Mucositis secondary to 5-FU, initially grade 2.  Irinotecan and 5-FU were dose reduced per study guidelines.  She did not develop mouth sores following the most recent cycle of chemotherapy.   Disposition:  Natalie Mccarthy appears stable. There is no clinical or x-ray evidence of disease progression. The plan is to continue FOLFIRI/Avastin per protocol. She will return for an office visit and chemotherapy in 2 weeks.   Natalie Shutters, Natalie Mccarthy  09/16/2011  1:08 PM

## 2011-09-16 NOTE — Telephone Encounter (Signed)
gve the pt her dec 2012 appt calendar °

## 2011-09-16 NOTE — Progress Notes (Signed)
Research note: [name], cycle 19. Natalie Mccarthy is in the cancer center today for lab work, physical exam, and cycle 19 of FOLFIRI/Avastin on the [name] metastatic colon cancer study. She reports she is doing well. Her appetite is good even though she still has taste changes. She reports she experienced her "usual" temperature elevation one which occurred one week after treatment. She had an episode of constipation, relieved by medication. She still has a small amount of epistaxis when she blows her nose. Dr. Truett Perna reviewed her CT scan. She continues have stable disease.  Per Dr. Brayton Layman  the abnormal lab results are not clinically significant.

## 2011-09-18 ENCOUNTER — Ambulatory Visit (HOSPITAL_BASED_OUTPATIENT_CLINIC_OR_DEPARTMENT_OTHER): Payer: BC Managed Care – PPO

## 2011-09-18 VITALS — BP 121/75 | HR 75 | Temp 97.3°F

## 2011-09-18 DIAGNOSIS — C184 Malignant neoplasm of transverse colon: Secondary | ICD-10-CM

## 2011-09-18 DIAGNOSIS — C787 Secondary malignant neoplasm of liver and intrahepatic bile duct: Secondary | ICD-10-CM

## 2011-09-18 DIAGNOSIS — Z5189 Encounter for other specified aftercare: Secondary | ICD-10-CM

## 2011-09-18 DIAGNOSIS — C189 Malignant neoplasm of colon, unspecified: Secondary | ICD-10-CM

## 2011-09-18 MED ORDER — PEGFILGRASTIM INJECTION 6 MG/0.6ML
6.0000 mg | Freq: Once | SUBCUTANEOUS | Status: AC
  Administered 2011-09-18: 6 mg via SUBCUTANEOUS
  Filled 2011-09-18: qty 0.6

## 2011-09-18 MED ORDER — SODIUM CHLORIDE 0.9 % IJ SOLN
10.0000 mL | INTRAMUSCULAR | Status: DC | PRN
  Administered 2011-09-18: 10 mL
  Filled 2011-09-18: qty 10

## 2011-09-18 MED ORDER — HEPARIN SOD (PORK) LOCK FLUSH 100 UNIT/ML IV SOLN
500.0000 [IU] | Freq: Once | INTRAVENOUS | Status: AC | PRN
  Administered 2011-09-18: 500 [IU]
  Filled 2011-09-18: qty 5

## 2011-09-18 NOTE — Progress Notes (Signed)
After removing port a cath needle, area to port noted to be red and irritated.  Pt denies pain or discomfort to port a cath site.  Area is not swollen or warm to touch.  Instructed patient to keep an eye on area and to call office if area becomes more inflamed, painful or swollen.

## 2011-09-18 NOTE — Patient Instructions (Signed)
Instructed patient to call with any problems.  Patient aware of next appointment 

## 2011-09-24 ENCOUNTER — Other Ambulatory Visit: Payer: Self-pay | Admitting: *Deleted

## 2011-09-24 DIAGNOSIS — C189 Malignant neoplasm of colon, unspecified: Secondary | ICD-10-CM

## 2011-09-26 ENCOUNTER — Telehealth: Payer: Self-pay | Admitting: *Deleted

## 2011-09-29 ENCOUNTER — Ambulatory Visit (HOSPITAL_BASED_OUTPATIENT_CLINIC_OR_DEPARTMENT_OTHER): Payer: BC Managed Care – PPO | Admitting: Nurse Practitioner

## 2011-09-29 ENCOUNTER — Telehealth: Payer: Self-pay | Admitting: Oncology

## 2011-09-29 ENCOUNTER — Other Ambulatory Visit: Payer: Self-pay | Admitting: Oncology

## 2011-09-29 ENCOUNTER — Other Ambulatory Visit (HOSPITAL_BASED_OUTPATIENT_CLINIC_OR_DEPARTMENT_OTHER): Payer: BC Managed Care – PPO | Admitting: Lab

## 2011-09-29 ENCOUNTER — Encounter: Payer: Self-pay | Admitting: *Deleted

## 2011-09-29 ENCOUNTER — Other Ambulatory Visit: Payer: Self-pay | Admitting: *Deleted

## 2011-09-29 VITALS — BP 134/65 | HR 81 | Temp 98.5°F | Ht 68.0 in | Wt 137.6 lb

## 2011-09-29 DIAGNOSIS — D702 Other drug-induced agranulocytosis: Secondary | ICD-10-CM

## 2011-09-29 DIAGNOSIS — D509 Iron deficiency anemia, unspecified: Secondary | ICD-10-CM

## 2011-09-29 DIAGNOSIS — C787 Secondary malignant neoplasm of liver and intrahepatic bile duct: Secondary | ICD-10-CM

## 2011-09-29 DIAGNOSIS — C187 Malignant neoplasm of sigmoid colon: Secondary | ICD-10-CM

## 2011-09-29 DIAGNOSIS — C184 Malignant neoplasm of transverse colon: Secondary | ICD-10-CM

## 2011-09-29 DIAGNOSIS — C189 Malignant neoplasm of colon, unspecified: Secondary | ICD-10-CM

## 2011-09-29 LAB — COMPREHENSIVE METABOLIC PANEL
AST: 28 U/L (ref 0–37)
Alkaline Phosphatase: 204 U/L — ABNORMAL HIGH (ref 39–117)
BUN: 12 mg/dL (ref 6–23)
Creatinine, Ser: 0.79 mg/dL (ref 0.50–1.10)
Potassium: 4.3 mEq/L (ref 3.5–5.3)

## 2011-09-29 LAB — CBC WITH DIFFERENTIAL/PLATELET
Eosinophils Absolute: 0.2 10*3/uL (ref 0.0–0.5)
HCT: 35.3 % (ref 34.8–46.6)
LYMPH%: 10.3 % — ABNORMAL LOW (ref 14.0–49.7)
MCV: 81.3 fL (ref 79.5–101.0)
MONO#: 0.3 10*3/uL (ref 0.1–0.9)
MONO%: 2.9 % (ref 0.0–14.0)
NEUT#: 8 10*3/uL — ABNORMAL HIGH (ref 1.5–6.5)
NEUT%: 84 % — ABNORMAL HIGH (ref 38.4–76.8)
Platelets: 173 10*3/uL (ref 145–400)
WBC: 9.5 10*3/uL (ref 3.9–10.3)

## 2011-09-29 LAB — URINALYSIS, MICROSCOPIC - CHCC
Blood: NEGATIVE
Nitrite: NEGATIVE
Protein: NEGATIVE mg/dL
Specific Gravity, Urine: 1.03 (ref 1.003–1.035)
pH: 5 (ref 4.6–8.0)

## 2011-09-29 NOTE — Telephone Encounter (Signed)
gve the pt her ct scan appt with instructions.

## 2011-09-29 NOTE — Progress Notes (Signed)
Natalie Mccarthy is in the cancer center today for lab work and physical exam before receiving cycle 20 FOLFIRI and Avastin.  She reports she did not have a fever after the last treatment. She did not have diarrhea but did have one day when she had a soft stool. She had prolonged constipation and took senokott.  She had several small sores on her tongue, last Friday and Sat. They are resolved now.  Per Dr. Truett Perna, abnormal lab results are not clinically significant.   Natalie Mccarthy will return tomorrow to receive treatment.

## 2011-09-29 NOTE — Progress Notes (Signed)
OFFICE PROGRESS NOTE   INTERVAL HISTORY:   Natalie Mccarthy is a 55 year old woman with metastatic colon cancer. She completed the most recent cycle of FOLFIRI/Avastin on 09/16/2011. She is seen today for scheduled followup.  Ms. Lye reports that overall she feels well. She denies nausea or vomiting. She recently developed some tiny ulcers at the left posterior lateral tongue. The ulcers did not interfere with oral intake and have resolved. She had one episode of loose stools. Otherwise she was constipated. She denies leg swelling or calf pain. No shortness of breath or chest pain. No bleeding.  She did not have a fever following the most recent cycle of chemotherapy. Objective:  Vital signs in last 24 hours:  Blood pressure 134/65, pulse 81, temperature 98.5 F (36.9 C), temperature source Oral, height 5\' 8"  (1.727 m), weight 137 lb 9.6 oz (62.415 kg).    Oropharynx is without thrush or ulceration. Mucous membranes are pink and moist. Lungs are clear. No wheezes or rales. Regular cardiac rhythm. Port-A-Cath site is nontender without erythema. Abdomen is soft and nontender. No hepatomegaly. Extremities are without edema. Calves are soft and nontender    Lab Results:  CBC  Lab Results  Component Value Date   WBC 9.5 09/29/2011   HGB 11.4* 09/29/2011   HCT 35.3 09/29/2011   MCV 81.3 09/29/2011   PLT 173 09/29/2011    Chemistry:      Component Value Date/Time   NA 136 09/16/2011 0910   K 4.1 09/16/2011 0910   CL 101 09/16/2011 0910   CO2 28 09/16/2011 0910   GLUCOSE 117* 09/16/2011 0910   BUN 12 09/16/2011 0910   CREATININE 0.73 09/16/2011 0910   CALCIUM 9.5 09/16/2011 0910   PROT 6.5 09/16/2011 0910   ALBUMIN 3.2* 09/16/2011 0910   AST 19 09/16/2011 0910   ALT 19 09/16/2011 0910   ALKPHOS 175* 09/16/2011 0910   BILITOT 0.2* 09/16/2011 0910        Medications: I have reviewed the patient's current medications.  Assessment/Plan: 1. Metastatic colon cancer with  multiple liver metastases noted on an MRI of the abdomen 12/02/2010 and on a CT 12/13/2010.  A transverse colon mass was noted on the CT 12/13/2010 and confirmed on a colonoscopy 12/17/2010.  She began treatment with FOLFIRI/Avastin on 12/31/2010.  Restaging CT on 06/20/2011 confirmed further improvement in the metastatic liver lesions and no evidence for progressive metastatic disease.  Restaging CT on 08/01/2011 showed stable liver lesions and no new lesions. A restaging CT on 09/12/2011 showed stable liver lesions and no new lesions. 2. Abdominal pain secondary to hepatomegaly/liver metastases, resolved. 3. Anorexia, early satiety and weight loss secondary to metastatic colon cancer, resolved. 4. Microcytic anemia, improved. 5. History of migraine headaches. 6. Status post uterine fibroid surgery. 7. Port-A-Cath placement 12/20/2010. 8. Delayed nausea following cycle 1 of FOLFIRI/Avastin, improved with the addition of Aloxi and prophylactic Decadron beginning with cycle #2. 9. Neutropenia secondary to chemotherapy.  Cycle #4 was held on 02/11/2011.  She subsequently has received Neulasta support. 10. Intermittent fever with no apparent source for infection, likely "tumor fever" or fever related to chemotherapy.  She did not have a fever following the most recent cycle of chemotherapy.  11. Mucositis secondary to 5-FU, initially grade 2.  Irinotecan and 5-FU were dose reduced per study guidelines.  She developed tiny ulcers at the left posterior lateral tongue following the most recent cycle of chemotherapy which did not interfere with oral intake and have since resolved.  Disposition:  Ms. Enlow appears stable. Plan to proceed with the next cycle of FOLFIRI/Avastin 09/30/2011 as scheduled. She will return for a followup visit and chemotherapy in 2 weeks. She will contact the office in the interim with any problems  Lonna Cobb, ANP/GNP-BC  09/29/2011  2:29 PM

## 2011-09-30 ENCOUNTER — Encounter: Payer: Self-pay | Admitting: *Deleted

## 2011-09-30 ENCOUNTER — Other Ambulatory Visit: Payer: Self-pay | Admitting: Oncology

## 2011-09-30 ENCOUNTER — Ambulatory Visit (HOSPITAL_BASED_OUTPATIENT_CLINIC_OR_DEPARTMENT_OTHER): Payer: BC Managed Care – PPO

## 2011-09-30 VITALS — BP 138/82 | HR 80 | Temp 97.9°F

## 2011-09-30 DIAGNOSIS — C787 Secondary malignant neoplasm of liver and intrahepatic bile duct: Secondary | ICD-10-CM

## 2011-09-30 DIAGNOSIS — C189 Malignant neoplasm of colon, unspecified: Secondary | ICD-10-CM

## 2011-09-30 DIAGNOSIS — Z5111 Encounter for antineoplastic chemotherapy: Secondary | ICD-10-CM

## 2011-09-30 DIAGNOSIS — C184 Malignant neoplasm of transverse colon: Secondary | ICD-10-CM

## 2011-09-30 MED ORDER — DEXAMETHASONE SODIUM PHOSPHATE 4 MG/ML IJ SOLN
20.0000 mg | Freq: Once | INTRAMUSCULAR | Status: AC
  Administered 2011-09-30: 20 mg via INTRAVENOUS

## 2011-09-30 MED ORDER — LEUCOVORIN CALCIUM INJECTION 350 MG
400.0000 mg/m2 | Freq: Once | INTRAVENOUS | Status: AC
  Administered 2011-09-30: 688 mg via INTRAVENOUS
  Filled 2011-09-30: qty 34.4

## 2011-09-30 MED ORDER — IRINOTECAN HCL CHEMO INJECTION 100 MG/5ML
150.0000 mg/m2 | Freq: Once | INTRAVENOUS | Status: AC
  Administered 2011-09-30: 258 mg via INTRAVENOUS
  Filled 2011-09-30: qty 12.9

## 2011-09-30 MED ORDER — HEPARIN SOD (PORK) LOCK FLUSH 100 UNIT/ML IV SOLN
500.0000 [IU] | Freq: Once | INTRAVENOUS | Status: DC | PRN
  Filled 2011-09-30: qty 5

## 2011-09-30 MED ORDER — SODIUM CHLORIDE 0.9 % IV SOLN
5.0000 mg/kg | Freq: Once | INTRAVENOUS | Status: AC
  Administered 2011-09-30: 325 mg via INTRAVENOUS
  Filled 2011-09-30: qty 13

## 2011-09-30 MED ORDER — SODIUM CHLORIDE 0.9 % IJ SOLN
10.0000 mL | INTRAMUSCULAR | Status: DC | PRN
  Filled 2011-09-30: qty 10

## 2011-09-30 MED ORDER — PALONOSETRON HCL INJECTION 0.25 MG/5ML
0.2500 mg | Freq: Once | INTRAVENOUS | Status: AC
  Administered 2011-09-30: 0.25 mg via INTRAVENOUS

## 2011-09-30 MED ORDER — FLUOROURACIL CHEMO INJECTION 2.5 GM/50ML
320.0000 mg/m2 | Freq: Once | INTRAVENOUS | Status: AC
  Administered 2011-09-30: 550 mg via INTRAVENOUS
  Filled 2011-09-30: qty 11

## 2011-09-30 MED ORDER — SODIUM CHLORIDE 0.9 % IV SOLN
1920.0000 mg/m2 | INTRAVENOUS | Status: DC
  Administered 2011-09-30: 3300 mg via INTRAVENOUS
  Filled 2011-09-30: qty 66

## 2011-09-30 MED ORDER — SODIUM CHLORIDE 0.9 % IV SOLN: Freq: Once | INTRAVENOUS | Status: DC

## 2011-09-30 MED ORDER — SODIUM CHLORIDE 0.9 % IV SOLN
Freq: Once | INTRAVENOUS | Status: AC
  Administered 2011-09-30: 10:00:00 via INTRAVENOUS

## 2011-09-30 NOTE — Patient Instructions (Signed)
Instructed patient to call with any problems.  Patient aware of next appointment and pump d/c 10/02/2011 at noon

## 2011-09-30 NOTE — Progress Notes (Signed)
In the cancer center today to receive cycle 20, FOLFIRI/Avastin on the [name] clinical trial. Sign for infusion given to A. Maurine Minister, RN.

## 2011-10-02 ENCOUNTER — Ambulatory Visit (HOSPITAL_BASED_OUTPATIENT_CLINIC_OR_DEPARTMENT_OTHER): Payer: BC Managed Care – PPO

## 2011-10-02 ENCOUNTER — Other Ambulatory Visit: Payer: Self-pay | Admitting: Oncology

## 2011-10-02 ENCOUNTER — Other Ambulatory Visit: Payer: Self-pay

## 2011-10-02 VITALS — BP 122/80 | HR 79 | Temp 96.8°F

## 2011-10-02 DIAGNOSIS — C189 Malignant neoplasm of colon, unspecified: Secondary | ICD-10-CM

## 2011-10-02 DIAGNOSIS — C787 Secondary malignant neoplasm of liver and intrahepatic bile duct: Secondary | ICD-10-CM

## 2011-10-02 DIAGNOSIS — Z5189 Encounter for other specified aftercare: Secondary | ICD-10-CM

## 2011-10-02 DIAGNOSIS — C184 Malignant neoplasm of transverse colon: Secondary | ICD-10-CM

## 2011-10-02 MED ORDER — PEGFILGRASTIM INJECTION 6 MG/0.6ML
6.0000 mg | Freq: Once | SUBCUTANEOUS | Status: AC
  Administered 2011-10-02: 6 mg via SUBCUTANEOUS
  Filled 2011-10-02: qty 0.6

## 2011-10-03 ENCOUNTER — Other Ambulatory Visit: Payer: Self-pay | Admitting: Certified Registered Nurse Anesthetist

## 2011-10-10 ENCOUNTER — Other Ambulatory Visit: Payer: Self-pay | Admitting: *Deleted

## 2011-10-10 ENCOUNTER — Telehealth: Payer: Self-pay | Admitting: *Deleted

## 2011-10-10 DIAGNOSIS — C189 Malignant neoplasm of colon, unspecified: Secondary | ICD-10-CM

## 2011-10-10 NOTE — Telephone Encounter (Signed)
Call from pt asking for advice. Son has been diagnosed with the flu. Was told that she should leave the home until he is better. Pt's latest WBC were within normal limits. Suggested she practice diligent handwashing and infection prevention at home. She verbalized understanding.

## 2011-10-11 ENCOUNTER — Other Ambulatory Visit: Payer: Self-pay | Admitting: Oncology

## 2011-10-13 ENCOUNTER — Ambulatory Visit (HOSPITAL_BASED_OUTPATIENT_CLINIC_OR_DEPARTMENT_OTHER): Payer: BC Managed Care – PPO | Admitting: Oncology

## 2011-10-13 ENCOUNTER — Telehealth: Payer: Self-pay | Admitting: Oncology

## 2011-10-13 ENCOUNTER — Other Ambulatory Visit (HOSPITAL_BASED_OUTPATIENT_CLINIC_OR_DEPARTMENT_OTHER): Payer: BC Managed Care – PPO | Admitting: Lab

## 2011-10-13 ENCOUNTER — Encounter: Payer: Self-pay | Admitting: *Deleted

## 2011-10-13 VITALS — BP 146/87 | HR 83 | Temp 97.1°F | Ht 68.0 in | Wt 138.2 lb

## 2011-10-13 DIAGNOSIS — C184 Malignant neoplasm of transverse colon: Secondary | ICD-10-CM

## 2011-10-13 DIAGNOSIS — C787 Secondary malignant neoplasm of liver and intrahepatic bile duct: Secondary | ICD-10-CM

## 2011-10-13 DIAGNOSIS — D509 Iron deficiency anemia, unspecified: Secondary | ICD-10-CM

## 2011-10-13 DIAGNOSIS — C189 Malignant neoplasm of colon, unspecified: Secondary | ICD-10-CM

## 2011-10-13 LAB — CBC WITH DIFFERENTIAL/PLATELET
BASO%: 0.8 % (ref 0.0–2.0)
EOS%: 1.8 % (ref 0.0–7.0)
HCT: 33.3 % — ABNORMAL LOW (ref 34.8–46.6)
MCH: 26 pg (ref 25.1–34.0)
MCHC: 32.3 g/dL (ref 31.5–36.0)
MONO#: 0.3 10*3/uL (ref 0.1–0.9)
RDW: 23.7 % — ABNORMAL HIGH (ref 11.2–14.5)
WBC: 6.5 10*3/uL (ref 3.9–10.3)
lymph#: 0.8 10*3/uL — ABNORMAL LOW (ref 0.9–3.3)

## 2011-10-13 LAB — COMPREHENSIVE METABOLIC PANEL
ALT: 21 U/L (ref 0–35)
AST: 20 U/L (ref 0–37)
Albumin: 3.4 g/dL — ABNORMAL LOW (ref 3.5–5.2)
CO2: 28 mEq/L (ref 19–32)
Calcium: 9.9 mg/dL (ref 8.4–10.5)
Chloride: 103 mEq/L (ref 96–112)
Potassium: 4.2 mEq/L (ref 3.5–5.3)
Sodium: 138 mEq/L (ref 135–145)
Total Protein: 6.7 g/dL (ref 6.0–8.3)

## 2011-10-13 LAB — URINALYSIS, MICROSCOPIC - CHCC
Glucose: NEGATIVE g/dL
Nitrite: NEGATIVE
Protein: NEGATIVE mg/dL
RBC count: NEGATIVE (ref 0–2)
Specific Gravity, Urine: 1.03 (ref 1.003–1.035)

## 2011-10-13 NOTE — Telephone Encounter (Signed)
gv pt appt schedule for dec/jan °

## 2011-10-13 NOTE — Progress Notes (Signed)
[name] is in the cancer center today for lab work and physical exam before receiving cycle 21 FOLFIRI and Avastin. Research labs were drawn.  She reports she did have a fever one evening 1 week after the last  treatment. She did not have diarrhea but had 4 days, starting 12/10, when she experienced abdominal cramping before having a soft stool. The forth day, she had a diarrheal stool and took imodium. She did not have any further cramping after taking the imodium.   Per Dr. Truett Perna, abnormal lab results are not clinically significant.  Natalie Mccarthy will return tomorrow to receive treatment.

## 2011-10-14 ENCOUNTER — Encounter: Payer: Self-pay | Admitting: *Deleted

## 2011-10-14 ENCOUNTER — Ambulatory Visit (HOSPITAL_BASED_OUTPATIENT_CLINIC_OR_DEPARTMENT_OTHER): Payer: BC Managed Care – PPO

## 2011-10-14 VITALS — BP 125/76 | HR 78 | Temp 98.4°F

## 2011-10-14 DIAGNOSIS — C184 Malignant neoplasm of transverse colon: Secondary | ICD-10-CM

## 2011-10-14 DIAGNOSIS — Z5111 Encounter for antineoplastic chemotherapy: Secondary | ICD-10-CM

## 2011-10-14 DIAGNOSIS — C787 Secondary malignant neoplasm of liver and intrahepatic bile duct: Secondary | ICD-10-CM

## 2011-10-14 DIAGNOSIS — C189 Malignant neoplasm of colon, unspecified: Secondary | ICD-10-CM

## 2011-10-14 MED ORDER — IRINOTECAN HCL CHEMO INJECTION 100 MG/5ML
150.0000 mg/m2 | Freq: Once | INTRAVENOUS | Status: AC
  Administered 2011-10-14: 258 mg via INTRAVENOUS
  Filled 2011-10-14: qty 12.9

## 2011-10-14 MED ORDER — LEUCOVORIN CALCIUM INJECTION 350 MG
400.0000 mg/m2 | Freq: Once | INTRAVENOUS | Status: AC
  Administered 2011-10-14: 688 mg via INTRAVENOUS
  Filled 2011-10-14: qty 34.4

## 2011-10-14 MED ORDER — DEXAMETHASONE SODIUM PHOSPHATE 4 MG/ML IJ SOLN
20.0000 mg | Freq: Once | INTRAMUSCULAR | Status: AC
  Administered 2011-10-14: 20 mg via INTRAVENOUS

## 2011-10-14 MED ORDER — SODIUM CHLORIDE 0.9 % IV SOLN
Freq: Once | INTRAVENOUS | Status: AC
  Administered 2011-10-14: 12:00:00 via INTRAVENOUS

## 2011-10-14 MED ORDER — SODIUM CHLORIDE 0.9 % IV SOLN
1920.0000 mg/m2 | INTRAVENOUS | Status: DC
  Administered 2011-10-14: 3300 mg via INTRAVENOUS
  Filled 2011-10-14: qty 66

## 2011-10-14 MED ORDER — SODIUM CHLORIDE 0.9 % IV SOLN
5.0000 mg/kg | Freq: Once | INTRAVENOUS | Status: AC
  Administered 2011-10-14: 325 mg via INTRAVENOUS
  Filled 2011-10-14: qty 13

## 2011-10-14 MED ORDER — PALONOSETRON HCL INJECTION 0.25 MG/5ML
0.2500 mg | Freq: Once | INTRAVENOUS | Status: AC
  Administered 2011-10-14: 0.25 mg via INTRAVENOUS

## 2011-10-14 MED ORDER — SODIUM CHLORIDE 0.9 % IV SOLN
Freq: Once | INTRAVENOUS | Status: AC
  Administered 2011-10-14: 13:00:00 via INTRAVENOUS

## 2011-10-14 MED ORDER — FLUOROURACIL CHEMO INJECTION 2.5 GM/50ML
320.0000 mg/m2 | Freq: Once | INTRAVENOUS | Status: AC
  Administered 2011-10-14: 550 mg via INTRAVENOUS
  Filled 2011-10-14: qty 11

## 2011-10-14 NOTE — Progress Notes (Signed)
OFFICE PROGRESS NOTE   INTERVAL HISTORY:   She returns as scheduled. She completed another cycle of chemotherapy on November 27. She tolerated the chemotherapy well. She again had one episode of fever during the week following chemotherapy. She reports 4 days of abdominal "cramping" followed by a single episode of diarrhea last week. The symptoms have resolved. She denies consistent abdominal pain. She denies bleeding.  Objective:  Vital signs in last 24 hours:  Blood pressure 146/87, pulse 83, temperature 97.1 F (36.2 C), temperature source Oral, height 5\' 8"  (1.727 m), weight 138 lb 3.2 oz (62.687 kg).    HEENT: No thrush or ulcers Resp: Lungs clear bilaterally Cardio: Regular rate and rhythm GI: The abdomen is soft and nontender. No mass. No hepatomegaly. Vascular: No leg edema    Portacath/PICC-without erythema  Lab Results:  Lab Results  Component Value Date   WBC 6.5 10/13/2011   HGB 10.8* 10/13/2011   HCT 33.3* 10/13/2011   MCV 80.7 10/13/2011   PLT 158 10/13/2011      Medications: I have reviewed the patient's current medications.  Assessment/Plan: 1.Metastatic colon cancer with multiple liver metastases noted on an MRI of the abdomen 12/02/2010 and on a CT 12/13/2010. A transverse colon mass was noted on the CT from 02/11/2011 and confirmed on a colonoscopy  12/17/2010. She began treatment with FOLFIRI/Avastin 10/01/2011. A restaging CT 06/20/2011 confirmed further improvement in the metastatic liver lesions and no evidence for progressive metastatic disease. A restaging CT 08/01/2011 showed stable liver lesions and no new lesions. Restaging CT 09/12/2011 showed stable liver lesions and no new lesions.  2. Abdominal pain secondary to hepatomegaly/liver metastases-resolved  3. Anorexia, early satiety, and weight loss secondary to metastatic colon cancer-resolved  4. Microcytic anemia  5. History of migraine  6. Status post uterine fibroid surgery  7.  Port-A-Cath placement 12/20/2010  8. Delayed nausea following cycle 1 of FOLFIRI/Avastin. Improved with the addition of Aloxi and prophylactic Decadron beginning with cycle #2  9. Neutropenia secondary to chemotherapy. Cycle #4 was held on 02/11/2011. She has subsequently received Neulasta support  10. Intermittent fever with no apparent source for infection, likely "tumor fever" or fever related to chemotherapy.  11. Mucositis secondary to 5-FU, initially grade 2. Irinotecan and 5-FU were dose reduced per study guidelines.  12. Abdominal cramping following the most recent cycle of chemotherapy-potentially related to the 5-FU, irinotecan, or the abdominal tumor. She will contact us for recurrent abdominal pain following this cycle of chemotherapy.    Disposition:  She continues to tolerate the chemotherapy well. The plan is to proceed with the next cycle of FOLFIRI/Avastin on December 11. She is scheduled for a restaging CT on December 21. She will return for an office visit on December 26.   Lucile Shutters, MD  10/14/2011  9:02 AM

## 2011-10-14 NOTE — Patient Instructions (Signed)
10/14/11- 1555-Pt discharged ambulatory with next appointment confirmed.  Pt aware to call with any questions or concerns.

## 2011-10-14 NOTE — Progress Notes (Signed)
Natalie Mccarthy is in the cancer center today to receive cycle 21 of FOLFIRI/Avastin. She had lab work and physical exam and was cleared for treatment today. Confirmed that she continues to have limited epistaxis when she blows her nose. Sign for treatment given to L. Ave Filter, RN. Met with [name] in the infusion area to review the consent for amendment 4, V. 5, dated 10/22012. Reviewed entire consent with emphasis on the information dated 10/22 about follow up the follow up schedule and continuing scans. She expressed agreement. All questions were answered. She then signed and dated the consent.  She was given a copy of the signed consent.

## 2011-10-16 ENCOUNTER — Ambulatory Visit (HOSPITAL_BASED_OUTPATIENT_CLINIC_OR_DEPARTMENT_OTHER): Payer: BC Managed Care – PPO

## 2011-10-16 VITALS — BP 121/78 | HR 77 | Temp 97.2°F

## 2011-10-16 DIAGNOSIS — Z5189 Encounter for other specified aftercare: Secondary | ICD-10-CM

## 2011-10-16 DIAGNOSIS — C184 Malignant neoplasm of transverse colon: Secondary | ICD-10-CM

## 2011-10-16 DIAGNOSIS — C189 Malignant neoplasm of colon, unspecified: Secondary | ICD-10-CM

## 2011-10-16 DIAGNOSIS — C787 Secondary malignant neoplasm of liver and intrahepatic bile duct: Secondary | ICD-10-CM

## 2011-10-16 MED ORDER — SODIUM CHLORIDE 0.9 % IJ SOLN
3.0000 mL | INTRAMUSCULAR | Status: DC | PRN
  Filled 2011-10-16: qty 10

## 2011-10-16 MED ORDER — SODIUM CHLORIDE 0.9 % IJ SOLN
10.0000 mL | INTRAMUSCULAR | Status: DC | PRN
  Administered 2011-10-16: 10 mL
  Filled 2011-10-16: qty 10

## 2011-10-16 MED ORDER — PEGFILGRASTIM INJECTION 6 MG/0.6ML
6.0000 mg | Freq: Once | SUBCUTANEOUS | Status: AC
  Administered 2011-10-16: 6 mg via SUBCUTANEOUS
  Filled 2011-10-16: qty 0.6

## 2011-10-16 MED ORDER — HEPARIN SOD (PORK) LOCK FLUSH 100 UNIT/ML IV SOLN
500.0000 [IU] | Freq: Once | INTRAVENOUS | Status: AC | PRN
  Administered 2011-10-16: 500 [IU]
  Filled 2011-10-16: qty 5

## 2011-10-16 MED ORDER — COLD PACK MISC ONCOLOGY
1.0000 | Freq: Once | Status: DC | PRN
  Filled 2011-10-16: qty 1

## 2011-10-16 MED ORDER — ALTEPLASE 2 MG IJ SOLR
2.0000 mg | Freq: Once | INTRAMUSCULAR | Status: DC | PRN
  Filled 2011-10-16: qty 2

## 2011-10-16 MED ORDER — HEPARIN SOD (PORK) LOCK FLUSH 100 UNIT/ML IV SOLN
250.0000 [IU] | Freq: Once | INTRAVENOUS | Status: DC | PRN
  Filled 2011-10-16: qty 5

## 2011-10-16 NOTE — Patient Instructions (Signed)
Call MD for problems 

## 2011-10-17 ENCOUNTER — Other Ambulatory Visit: Payer: Self-pay | Admitting: *Deleted

## 2011-10-17 ENCOUNTER — Telehealth: Payer: Self-pay | Admitting: Oncology

## 2011-10-17 DIAGNOSIS — C189 Malignant neoplasm of colon, unspecified: Secondary | ICD-10-CM

## 2011-10-17 NOTE — Telephone Encounter (Signed)
pt called  lmovm that she did not have any labs scheduled for 01/08 and 01/22.  added labs to those dates.  rtn call to pt lmovm and informed her

## 2011-10-24 ENCOUNTER — Telehealth: Payer: Self-pay | Admitting: *Deleted

## 2011-10-24 ENCOUNTER — Inpatient Hospital Stay (HOSPITAL_COMMUNITY)
Admission: RE | Admit: 2011-10-24 | Discharge: 2011-10-24 | Payer: BC Managed Care – PPO | Source: Ambulatory Visit | Attending: Nurse Practitioner | Admitting: Nurse Practitioner

## 2011-10-24 ENCOUNTER — Encounter: Payer: Self-pay | Admitting: *Deleted

## 2011-10-24 NOTE — Telephone Encounter (Signed)
Called pt to follow up on abdominal cramping. She reports this has resolved with Imodium. She will have scan on 10/27/11.

## 2011-10-24 NOTE — Telephone Encounter (Signed)
Natalie Mccarthy called to report that she had some abdominal cramping so she changed her CT scheduled for this afternoon to Monday, 12/24. She wanted to be sure that would be enough time for the result to be available for her appointment on 12/26. She also reports she had some abdominal cramping She also reports the cramping is almost resolved. She was just concerned about drinking the contrast for the prep.

## 2011-10-27 ENCOUNTER — Ambulatory Visit (HOSPITAL_COMMUNITY)
Admission: RE | Admit: 2011-10-27 | Discharge: 2011-10-27 | Disposition: A | Discharge status: Home / Self Care | Payer: BC Managed Care – PPO | Source: Ambulatory Visit | Attending: Nurse Practitioner | Admitting: Nurse Practitioner

## 2011-10-27 DIAGNOSIS — C787 Secondary malignant neoplasm of liver and intrahepatic bile duct: Secondary | ICD-10-CM | POA: Insufficient documentation

## 2011-10-27 DIAGNOSIS — C189 Malignant neoplasm of colon, unspecified: Secondary | ICD-10-CM

## 2011-10-27 MED ORDER — IOHEXOL 300 MG/ML  SOLN: 80.0000 mL | Freq: Once | INTRAMUSCULAR | Status: AC | PRN

## 2011-10-28 ENCOUNTER — Other Ambulatory Visit: Payer: Self-pay | Admitting: Oncology

## 2011-10-29 ENCOUNTER — Ambulatory Visit (HOSPITAL_BASED_OUTPATIENT_CLINIC_OR_DEPARTMENT_OTHER): Payer: BC Managed Care – PPO

## 2011-10-29 ENCOUNTER — Encounter: Payer: Self-pay | Admitting: *Deleted

## 2011-10-29 ENCOUNTER — Ambulatory Visit (HOSPITAL_BASED_OUTPATIENT_CLINIC_OR_DEPARTMENT_OTHER): Payer: BC Managed Care – PPO | Admitting: Oncology

## 2011-10-29 ENCOUNTER — Other Ambulatory Visit (HOSPITAL_BASED_OUTPATIENT_CLINIC_OR_DEPARTMENT_OTHER): Payer: BC Managed Care – PPO

## 2011-10-29 VITALS — BP 123/75 | HR 87 | Temp 97.0°F | Ht 68.0 in | Wt 137.4 lb

## 2011-10-29 DIAGNOSIS — C189 Malignant neoplasm of colon, unspecified: Secondary | ICD-10-CM

## 2011-10-29 DIAGNOSIS — C184 Malignant neoplasm of transverse colon: Secondary | ICD-10-CM

## 2011-10-29 DIAGNOSIS — C787 Secondary malignant neoplasm of liver and intrahepatic bile duct: Secondary | ICD-10-CM

## 2011-10-29 DIAGNOSIS — Z5111 Encounter for antineoplastic chemotherapy: Secondary | ICD-10-CM

## 2011-10-29 DIAGNOSIS — D649 Anemia, unspecified: Secondary | ICD-10-CM

## 2011-10-29 DIAGNOSIS — Z79899 Other long term (current) drug therapy: Secondary | ICD-10-CM

## 2011-10-29 LAB — CBC WITH DIFFERENTIAL/PLATELET
Basophils Absolute: 0 10*3/uL (ref 0.0–0.1)
Eosinophils Absolute: 0.1 10*3/uL (ref 0.0–0.5)
LYMPH%: 11.1 % — ABNORMAL LOW (ref 14.0–49.7)
MCH: 25.5 pg (ref 25.1–34.0)
MCHC: 32.1 g/dL (ref 31.5–36.0)
MCV: 79.5 fL (ref 79.5–101.0)
MONO#: 0.3 10*3/uL (ref 0.1–0.9)
NEUT%: 81.6 % — ABNORMAL HIGH (ref 38.4–76.8)
lymph#: 0.7 10*3/uL — ABNORMAL LOW (ref 0.9–3.3)

## 2011-10-29 LAB — URINALYSIS, MICROSCOPIC - CHCC
Bilirubin (Urine): NEGATIVE
Blood: NEGATIVE
Glucose: NEGATIVE g/dL
Leukocyte Esterase: NEGATIVE
Nitrite: NEGATIVE
Specific Gravity, Urine: 1.03 (ref 1.003–1.035)

## 2011-10-29 LAB — COMPREHENSIVE METABOLIC PANEL
CO2: 29 mEq/L (ref 19–32)
Calcium: 10 mg/dL (ref 8.4–10.5)
Chloride: 103 mEq/L (ref 96–112)
Creatinine, Ser: 0.81 mg/dL (ref 0.50–1.10)
Glucose, Bld: 107 mg/dL — ABNORMAL HIGH (ref 70–99)
Total Bilirubin: 0.3 mg/dL (ref 0.3–1.2)

## 2011-10-29 MED ORDER — SODIUM CHLORIDE 0.9 % IV SOLN: Freq: Once | INTRAVENOUS | Status: DC

## 2011-10-29 MED ORDER — PALONOSETRON HCL INJECTION 0.25 MG/5ML
0.2500 mg | Freq: Once | INTRAVENOUS | Status: AC
  Administered 2011-10-29: 0.25 mg via INTRAVENOUS

## 2011-10-29 MED ORDER — BEVACIZUMAB CHEMO INJECTION 400 MG FOR MAVERICC ML25710
5.0000 mg/kg | Freq: Once | INTRAVENOUS | Status: AC
  Administered 2011-10-29: 325 mg via INTRAVENOUS
  Filled 2011-10-29: qty 13

## 2011-10-29 MED ORDER — SODIUM CHLORIDE 0.9 % IV SOLN
1920.0000 mg/m2 | INTRAVENOUS | Status: DC
  Administered 2011-10-29: 3300 mg via INTRAVENOUS
  Filled 2011-10-29: qty 66

## 2011-10-29 MED ORDER — DEXAMETHASONE SODIUM PHOSPHATE 4 MG/ML IJ SOLN
20.0000 mg | Freq: Once | INTRAMUSCULAR | Status: AC
  Administered 2011-10-29: 20 mg via INTRAVENOUS

## 2011-10-29 MED ORDER — FLUOROURACIL CHEMO INJECTION 2.5 GM/50ML
320.0000 mg/m2 | Freq: Once | INTRAVENOUS | Status: AC
  Administered 2011-10-29: 550 mg via INTRAVENOUS
  Filled 2011-10-29: qty 11

## 2011-10-29 MED ORDER — LEUCOVORIN CALCIUM INJECTION 350 MG
400.0000 mg/m2 | Freq: Once | INTRAMUSCULAR | Status: AC
  Administered 2011-10-29: 688 mg via INTRAVENOUS
  Filled 2011-10-29: qty 34.4

## 2011-10-29 MED ORDER — IRINOTECAN HCL CHEMO INJECTION 100 MG/5ML
150.0000 mg/m2 | Freq: Once | INTRAVENOUS | Status: AC
  Administered 2011-10-29: 258 mg via INTRAVENOUS
  Filled 2011-10-29: qty 12.9

## 2011-10-29 NOTE — Progress Notes (Signed)
October 29, 2011, 11:30 AM Natalie Mccarthy is in the cancer center today for lab work and physical exam before receiving cycle 22 of FOLFIRI and Avastin. She was seen by Dr. Truett Perna.  [name] reports she is still experiencing the same adverse events, small amount of epistaxis when she blows her nose, taste changes, fever one evening a week after treatment, intermittent constipation. She did experience one day of abdominal cramping on 12/12.  She continues to work full time, balancing the work from home with going to the office. She does need some rest periods.  Lab results WNL for treatment. er Dr. Truett Perna, abnormal lab results are not clinically significant.   Sign for infusion given to E. Manning Charity, RN.   11/12/11 14:02 Entered to sign note

## 2011-10-29 NOTE — Progress Notes (Signed)
OFFICE PROGRESS NOTE   INTERVAL HISTORY:   She returns as scheduled. She completed another cycle of chemotherapy on December 10. She tolerated chemotherapy well. She reports 1 episode of abdominal pain last week. She continues to have mild nose bleeding. No other bleeding. No other complaint.  Objective:  Vital signs in last 24 hours:  Blood pressure 123/75, pulse 87, temperature 97 F (36.1 C), temperature source Oral, height 5\' 8"  (1.727 m), weight 137 lb 6.4 oz (62.324 kg).    HEENT: No thrush or ulcer Resp: Lungs clear bilaterally Cardio: Regular rate and rhythm GI: The abdomen is nontender. No hepatomegaly. No mass. Vascular: No leg edema    Portacath/PICC-without erythema  Lab Results:  Lab Results  Component Value Date   WBC 6.0 10/29/2011   HGB 10.6* 10/29/2011   HCT 32.9* 10/29/2011   MCV 79.5 10/29/2011   PLT 155 10/29/2011    Radiology: Restaging CT of the abdomen and pelvis on 10/27/2011-no change in the index liver metastases. No new or progressive hepatic metastatic disease. No free fluid or lymphadenopathy in the abdomen    Medications: I have reviewed the patient's current medications.  Assessment/Plan: 1.Metastatic colon cancer with multiple liver metastases noted on an MRI of the abdomen 12/02/2010 and on a CT 12/13/2010. A transverse colon mass was noted on the CT from 02/11/2011 and confirmed on a colonoscopy 12/17/2010. She began treatment with FOLFIRI/Avastin 10/01/2011. A restaging CT 06/20/2011 confirmed further improvement in the metastatic liver lesions and no evidence for progressive metastatic disease. A restaging CT 08/01/2011 showed stable liver lesions and no new lesions. Restaging CT 09/12/2011 showed stable liver lesions and no new lesions. A restaging CT 10/27/2011 showed stable liver lesions and no new lesions. 2. Abdominal pain secondary to hepatomegaly/liver metastases-resolved  3. Anorexia, early satiety, and weight loss secondary to  metastatic colon cancer-resolved  4. Microcytic anemia  5. History of migraine  6. Status post uterine fibroid surgery  7. Port-A-Cath placement 12/20/2010  8. Delayed nausea following cycle 1 of FOLFIRI/Avastin. Improved with the addition of Aloxi and prophylactic Decadron beginning with cycle #2  9. Neutropenia secondary to chemotherapy. Cycle #4 was held on 02/11/2011. She has subsequently received Neulasta support  10. Intermittent fever with no apparent source for infection, likely "tumor fever" or fever related to chemotherapy.  11. Mucositis secondary to 5-FU, initially grade 2. Irinotecan and 5-FU were dose reduced per study guidelines.  12. Several episodes of abdominal cramping over the past 2 months-? Related to her irinotecan, 5-FU, or potentially the colon tumor.      Disposition:   She appears stable. The restaging CT shows no evidence for disease progression. The plan is to continue every 2 week FOLFIRI/Avastin.   Lucile Shutters, MD  10/29/2011  7:11 PM

## 2011-10-31 ENCOUNTER — Ambulatory Visit (HOSPITAL_BASED_OUTPATIENT_CLINIC_OR_DEPARTMENT_OTHER): Payer: BC Managed Care – PPO

## 2011-10-31 VITALS — BP 110/69 | HR 74 | Temp 98.0°F

## 2011-10-31 DIAGNOSIS — Z5189 Encounter for other specified aftercare: Secondary | ICD-10-CM

## 2011-10-31 DIAGNOSIS — C189 Malignant neoplasm of colon, unspecified: Secondary | ICD-10-CM

## 2011-10-31 DIAGNOSIS — C184 Malignant neoplasm of transverse colon: Secondary | ICD-10-CM

## 2011-10-31 DIAGNOSIS — C787 Secondary malignant neoplasm of liver and intrahepatic bile duct: Secondary | ICD-10-CM

## 2011-10-31 MED ORDER — HEPARIN SOD (PORK) LOCK FLUSH 100 UNIT/ML IV SOLN
500.0000 [IU] | Freq: Once | INTRAVENOUS | Status: AC | PRN
  Administered 2011-10-31: 500 [IU]
  Filled 2011-10-31: qty 5

## 2011-10-31 MED ORDER — SODIUM CHLORIDE 0.9 % IJ SOLN
10.0000 mL | INTRAMUSCULAR | Status: DC | PRN
  Administered 2011-10-31: 10 mL
  Filled 2011-10-31: qty 10

## 2011-10-31 MED ORDER — PEGFILGRASTIM INJECTION 6 MG/0.6ML
6.0000 mg | Freq: Once | SUBCUTANEOUS | Status: AC
  Administered 2011-10-31: 6 mg via SUBCUTANEOUS
  Filled 2011-10-31: qty 0.6

## 2011-10-31 NOTE — Patient Instructions (Signed)
Call MD for problems 

## 2011-11-06 ENCOUNTER — Other Ambulatory Visit: Payer: Self-pay | Admitting: *Deleted

## 2011-11-06 DIAGNOSIS — C189 Malignant neoplasm of colon, unspecified: Secondary | ICD-10-CM

## 2011-11-07 ENCOUNTER — Other Ambulatory Visit: Payer: Self-pay | Admitting: *Deleted

## 2011-11-07 DIAGNOSIS — C189 Malignant neoplasm of colon, unspecified: Secondary | ICD-10-CM

## 2011-11-10 ENCOUNTER — Other Ambulatory Visit: Payer: Self-pay | Admitting: Oncology

## 2011-11-11 ENCOUNTER — Other Ambulatory Visit (HOSPITAL_BASED_OUTPATIENT_CLINIC_OR_DEPARTMENT_OTHER): Payer: BC Managed Care – PPO

## 2011-11-11 ENCOUNTER — Other Ambulatory Visit: Payer: Self-pay | Admitting: Oncology

## 2011-11-11 ENCOUNTER — Encounter: Payer: Self-pay | Admitting: *Deleted

## 2011-11-11 ENCOUNTER — Ambulatory Visit (HOSPITAL_BASED_OUTPATIENT_CLINIC_OR_DEPARTMENT_OTHER): Payer: BC Managed Care – PPO | Admitting: Nurse Practitioner

## 2011-11-11 ENCOUNTER — Ambulatory Visit (HOSPITAL_BASED_OUTPATIENT_CLINIC_OR_DEPARTMENT_OTHER): Payer: BC Managed Care – PPO

## 2011-11-11 ENCOUNTER — Telehealth: Payer: Self-pay | Admitting: Oncology

## 2011-11-11 VITALS — BP 109/63 | HR 78 | Temp 98.0°F | Ht 68.0 in | Wt 136.4 lb

## 2011-11-11 DIAGNOSIS — C189 Malignant neoplasm of colon, unspecified: Secondary | ICD-10-CM

## 2011-11-11 DIAGNOSIS — C787 Secondary malignant neoplasm of liver and intrahepatic bile duct: Secondary | ICD-10-CM

## 2011-11-11 DIAGNOSIS — C184 Malignant neoplasm of transverse colon: Secondary | ICD-10-CM

## 2011-11-11 DIAGNOSIS — Z5111 Encounter for antineoplastic chemotherapy: Secondary | ICD-10-CM

## 2011-11-11 LAB — CBC WITH DIFFERENTIAL/PLATELET
BASO%: 1.5 % (ref 0.0–2.0)
Basophils Absolute: 0.1 10*3/uL (ref 0.0–0.1)
EOS%: 2.8 % (ref 0.0–7.0)
HGB: 10.6 g/dL — ABNORMAL LOW (ref 11.6–15.9)
MCH: 25.7 pg (ref 25.1–34.0)
MCHC: 32.6 g/dL (ref 31.5–36.0)
MCV: 78.9 fL — ABNORMAL LOW (ref 79.5–101.0)
MONO%: 4.3 % (ref 0.0–14.0)
RBC: 4.13 10*6/uL (ref 3.70–5.45)
RDW: 23.5 % — ABNORMAL HIGH (ref 11.2–14.5)
lymph#: 0.8 10*3/uL — ABNORMAL LOW (ref 0.9–3.3)

## 2011-11-11 LAB — URINALYSIS, MICROSCOPIC - CHCC
Ketones: NEGATIVE mg/dL
Nitrite: NEGATIVE
Protein: NEGATIVE mg/dL
pH: 6 (ref 4.6–8.0)

## 2011-11-11 LAB — COMPREHENSIVE METABOLIC PANEL
ALT: 17 U/L (ref 0–35)
AST: 19 U/L (ref 0–37)
Albumin: 3.5 g/dL (ref 3.5–5.2)
Alkaline Phosphatase: 187 U/L — ABNORMAL HIGH (ref 39–117)
BUN: 15 mg/dL (ref 6–23)
Calcium: 9.8 mg/dL (ref 8.4–10.5)
Chloride: 104 mEq/L (ref 96–112)
Potassium: 3.7 mEq/L (ref 3.5–5.3)
Sodium: 140 mEq/L (ref 135–145)
Total Protein: 6.9 g/dL (ref 6.0–8.3)

## 2011-11-11 MED ORDER — SODIUM CHLORIDE 0.9 % IV SOLN
1920.0000 mg/m2 | INTRAVENOUS | Status: DC
  Administered 2011-11-11: 3300 mg via INTRAVENOUS
  Filled 2011-11-11: qty 66

## 2011-11-11 MED ORDER — DEXAMETHASONE SODIUM PHOSPHATE 4 MG/ML IJ SOLN
20.0000 mg | Freq: Once | INTRAMUSCULAR | Status: AC
  Administered 2011-11-11: 20 mg via INTRAVENOUS

## 2011-11-11 MED ORDER — LEUCOVORIN CALCIUM INJECTION 350 MG
400.0000 mg/m2 | Freq: Once | INTRAVENOUS | Status: AC
  Administered 2011-11-11: 688 mg via INTRAVENOUS
  Filled 2011-11-11: qty 34.4

## 2011-11-11 MED ORDER — SODIUM CHLORIDE 0.9 % IV SOLN
Freq: Once | INTRAVENOUS | Status: AC
  Administered 2011-11-11: 14:00:00 via INTRAVENOUS

## 2011-11-11 MED ORDER — PALONOSETRON HCL INJECTION 0.25 MG/5ML
0.2500 mg | Freq: Once | INTRAVENOUS | Status: AC
  Administered 2011-11-11: 0.25 mg via INTRAVENOUS

## 2011-11-11 MED ORDER — IRINOTECAN HCL CHEMO INJECTION 100 MG/5ML
150.0000 mg/m2 | Freq: Once | INTRAVENOUS | Status: AC
  Administered 2011-11-11: 258 mg via INTRAVENOUS
  Filled 2011-11-11: qty 12.9

## 2011-11-11 MED ORDER — SODIUM CHLORIDE 0.9 % IV SOLN
5.0000 mg/kg | Freq: Once | INTRAVENOUS | Status: AC
  Administered 2011-11-11: 325 mg via INTRAVENOUS
  Filled 2011-11-11: qty 13

## 2011-11-11 MED ORDER — FLUOROURACIL CHEMO INJECTION 2.5 GM/50ML
320.0000 mg/m2 | Freq: Once | INTRAVENOUS | Status: AC
  Administered 2011-11-11: 550 mg via INTRAVENOUS
  Filled 2011-11-11: qty 11

## 2011-11-11 MED ORDER — SODIUM CHLORIDE 0.9 % IV SOLN
Freq: Once | INTRAVENOUS | Status: AC
  Administered 2011-11-11: 12:00:00 via INTRAVENOUS

## 2011-11-11 NOTE — Progress Notes (Signed)
11/11/2011 12:00 [name] is in the cancer center today for lab work, physical assessment, and to receive cycle 23 FOLFIRI/Avastin.  She was seen by L. Maisie Fus, NP. Shaquilla has been active during the holidays. Her ECOG PS is 0. She reports that she did not experience a fever this cycle. She does still use laxatives for constipation and has a small amount of blood when she blows her nose.  She does have taste changes but reports a good appetite.   Clarified when she first noticed soreness in her mouth. She said it began Thurs., 11/06/11.   Lab results are  WNL for treatment. Abnormal results are not clinically significant per Dr. Truett Perna.

## 2011-11-11 NOTE — Progress Notes (Signed)
OFFICE PROGRESS NOTE  Interval history:  Natalie Mccarthy is a 56 year old woman with metastatic colon cancer. She is being treated with FOLFIRI/Avastin. She completed the most recent cycle 10/29/2011. She denies nausea/vomiting. She noted a focal area of "soreness" at the left buccal region following the most recent cycle of chemotherapy. The discomfort resolved. She did not develop any areas of ulceration. She has had no further abdominal pain. She did not have a fever following the most recent cycle. She denies shortness of breath, chest pain. No bleeding. She denies leg swelling or calf pain.   Objective: Blood pressure 109/63, pulse 78, temperature 98 F (36.7 C), temperature source Oral, height 5\' 8"  (1.727 m), weight 136 lb 6.4 oz (61.871 kg).  Small area of erythema at the left buccal mucosa posteriorly. No palpable cervical or supraclavicular lymph nodes. Lungs are clear. Regular cardiac rhythm. Port-A-Cath site is without erythema. Abdomen is soft and nontender. No hepatomegaly. Extremities are without edema. Calves are soft and nontender.   Lab Results: Lab Results  Component Value Date   WBC 6.8 11/11/2011   HGB 10.6* 11/11/2011   HCT 32.6* 11/11/2011   MCV 78.9* 11/11/2011   PLT 132* 11/11/2011    Chemistry:    Chemistry      Component Value Date/Time   NA 140 10/29/2011 0850   K 3.9 10/29/2011 0850   CL 103 10/29/2011 0850   CO2 29 10/29/2011 0850   BUN 11 10/29/2011 0850   CREATININE 0.81 10/29/2011 0850      Component Value Date/Time   CALCIUM 10.0 10/29/2011 0850   ALKPHOS 187* 10/29/2011 0850   AST 27 10/29/2011 0850   ALT 25 10/29/2011 0850   BILITOT 0.3 10/29/2011 0850       Studies/Results: Ct Abdomen W Contrast  10/27/2011  *RADIOLOGY REPORT*  Clinical Data:  Metastatic colon cancer  CT ABDOMEN WITH CONTRAST  Technique:  Multidetector CT imaging of the abdomen was performed using the standard protocol following bolus administration of intravenous contrast.   Contrast:  80 ml Omnipaque-300  Comparison:  09/12/2011  Findings:  RECIST protocol index lesions: Index lesion involving the right hepatic lobe and medial segment of left hepatic lobe seen on image 15 today measures 5.9 cm in the same dimension in which it measured 6.1 cm previously.  Lesion in the dome of the lateral segment left liver (image 9 today) measures 2.2 cm on today's study compared 2.4 cm previously.  Posterior right hepatic lesion seen on image 11 today measures 1.8 cm compared to 1.9 cm previously.  The spleen, stomach, duodenum, pancreas, gallbladder, hands, and kidneys are normal.  No abdominal aneurysm.  There is no free fluid or lymphadenopathy in the abdomen.  IMPRESSION: No substantial change in size of the index liver metastases. Overall no substantial change outside of the index lesions with no new or progressive hepatic disease.  Original Report Authenticated By: ERIC A. MANSELL, M.D.     Assessment/Plan:  1.Metastatic colon cancer with multiple liver metastases noted on an MRI of the abdomen 12/02/2010 and on a CT 12/13/2010. A transverse colon mass was noted on the CT from 02/11/2011 and confirmed on a colonoscopy 12/17/2010. She began treatment with FOLFIRI/Avastin 12/31/2010. A restaging CT 06/20/2011 confirmed further improvement in the metastatic liver lesions and no evidence for progressive metastatic disease. A restaging CT 08/01/2011 showed stable liver lesions and no new lesions. Restaging CT 09/12/2011 showed stable liver lesions and no new lesions. A restaging CT 10/27/2011 showed stable liver lesions  and no new lesions.  2. Abdominal pain secondary to hepatomegaly/liver metastases-resolved.  3. Anorexia, early satiety, and weight loss secondary to metastatic colon cancer-resolved.  4. Microcytic anemia.  5. History of migraine.  6. Status post uterine fibroid surgery.  7. Port-A-Cath placement 12/20/2010.  8. Delayed nausea following cycle 1 of FOLFIRI/Avastin.  Improved with the addition of Aloxi and prophylactic Decadron beginning with cycle #2.  9. Neutropenia secondary to chemotherapy. Cycle #4 was held on 02/11/2011. She has subsequently received Neulasta support.  10. Intermittent fever with no apparent source for infection, likely "tumor fever" or fever related to chemotherapy. She did not have a fever following the most recent cycle of chemotherapy. 11. Mucositis secondary to 5-FU, initially grade 2. Irinotecan and 5-FU were dose reduced per study guidelines.  12. Several episodes of abdominal cramping over the past 2 months-? Related to irinotecan, 5-FU, or potentially the colon tumor. She denies recent abdominal cramping.  Disposition-Natalie Mccarthy appears stable. Plan to proceed with the next cycle of FOLFIRI/Avastin today as scheduled. She will return for a followup visit and chemotherapy in 2 weeks. She will contact the office the interim with any problems.  Plan reviewed Dr. Truett Perna.     Lonna Cobb ANP/GNP-BC

## 2011-11-11 NOTE — Telephone Encounter (Signed)
gv pt appt schedule for jan/feb including ct for 2/1.

## 2011-11-13 ENCOUNTER — Ambulatory Visit (HOSPITAL_BASED_OUTPATIENT_CLINIC_OR_DEPARTMENT_OTHER): Payer: BC Managed Care – PPO

## 2011-11-13 VITALS — BP 121/68 | HR 80 | Temp 97.0°F

## 2011-11-13 DIAGNOSIS — C189 Malignant neoplasm of colon, unspecified: Secondary | ICD-10-CM

## 2011-11-13 MED ORDER — SODIUM CHLORIDE 0.9 % IJ SOLN
10.0000 mL | INTRAMUSCULAR | Status: DC | PRN
  Administered 2011-11-13: 10 mL
  Filled 2011-11-13: qty 10

## 2011-11-13 MED ORDER — HEPARIN SOD (PORK) LOCK FLUSH 100 UNIT/ML IV SOLN
500.0000 [IU] | Freq: Once | INTRAVENOUS | Status: AC | PRN
  Administered 2011-11-13: 500 [IU]
  Filled 2011-11-13: qty 5

## 2011-11-13 MED ORDER — PEGFILGRASTIM INJECTION 6 MG/0.6ML
6.0000 mg | Freq: Once | SUBCUTANEOUS | Status: AC
  Administered 2011-11-13: 6 mg via SUBCUTANEOUS
  Filled 2011-11-13: qty 0.6

## 2011-11-17 ENCOUNTER — Other Ambulatory Visit: Payer: Self-pay | Admitting: *Deleted

## 2011-11-17 DIAGNOSIS — C189 Malignant neoplasm of colon, unspecified: Secondary | ICD-10-CM

## 2011-11-23 ENCOUNTER — Other Ambulatory Visit: Payer: Self-pay | Admitting: Oncology

## 2011-11-25 ENCOUNTER — Other Ambulatory Visit: Payer: BC Managed Care – PPO

## 2011-11-25 ENCOUNTER — Ambulatory Visit (HOSPITAL_BASED_OUTPATIENT_CLINIC_OR_DEPARTMENT_OTHER): Payer: BC Managed Care – PPO | Admitting: Oncology

## 2011-11-25 ENCOUNTER — Ambulatory Visit (HOSPITAL_BASED_OUTPATIENT_CLINIC_OR_DEPARTMENT_OTHER): Payer: BC Managed Care – PPO

## 2011-11-25 ENCOUNTER — Telehealth: Payer: Self-pay | Admitting: Oncology

## 2011-11-25 ENCOUNTER — Encounter: Payer: Self-pay | Admitting: *Deleted

## 2011-11-25 VITALS — BP 126/78 | HR 80 | Temp 98.3°F | Ht 68.0 in | Wt 139.6 lb

## 2011-11-25 DIAGNOSIS — C787 Secondary malignant neoplasm of liver and intrahepatic bile duct: Secondary | ICD-10-CM

## 2011-11-25 DIAGNOSIS — C189 Malignant neoplasm of colon, unspecified: Secondary | ICD-10-CM

## 2011-11-25 DIAGNOSIS — C184 Malignant neoplasm of transverse colon: Secondary | ICD-10-CM

## 2011-11-25 DIAGNOSIS — Z5111 Encounter for antineoplastic chemotherapy: Secondary | ICD-10-CM

## 2011-11-25 LAB — CBC WITH DIFFERENTIAL/PLATELET
BASO%: 0.4 % (ref 0.0–2.0)
Basophils Absolute: 0 10*3/uL (ref 0.0–0.1)
EOS%: 2.3 % (ref 0.0–7.0)
MCH: 25.8 pg (ref 25.1–34.0)
MCHC: 32.3 g/dL (ref 31.5–36.0)
MCV: 79.9 fL (ref 79.5–101.0)
MONO%: 3.2 % (ref 0.0–14.0)
RBC: 4.22 10*6/uL (ref 3.70–5.45)
RDW: 24.8 % — ABNORMAL HIGH (ref 11.2–14.5)
lymph#: 0.7 10*3/uL — ABNORMAL LOW (ref 0.9–3.3)

## 2011-11-25 LAB — COMPREHENSIVE METABOLIC PANEL
ALT: 64 U/L — ABNORMAL HIGH (ref 0–35)
AST: 50 U/L — ABNORMAL HIGH (ref 0–37)
Albumin: 3.5 g/dL (ref 3.5–5.2)
Alkaline Phosphatase: 199 U/L — ABNORMAL HIGH (ref 39–117)
BUN: 12 mg/dL (ref 6–23)
Calcium: 9.4 mg/dL (ref 8.4–10.5)
Chloride: 105 mEq/L (ref 96–112)
Potassium: 3.7 mEq/L (ref 3.5–5.3)

## 2011-11-25 LAB — URINALYSIS, MICROSCOPIC - CHCC
Bilirubin (Urine): NEGATIVE
Glucose: NEGATIVE g/dL
Ketones: NEGATIVE mg/dL
RBC count: NEGATIVE (ref 0–2)
pH: 5 (ref 4.6–8.0)

## 2011-11-25 MED ORDER — HEPARIN SOD (PORK) LOCK FLUSH 100 UNIT/ML IV SOLN
500.0000 [IU] | Freq: Once | INTRAVENOUS | Status: AC | PRN
  Filled 2011-11-25: qty 5

## 2011-11-25 MED ORDER — SODIUM CHLORIDE 0.9 % IJ SOLN
10.0000 mL | INTRAMUSCULAR | Status: DC | PRN
  Filled 2011-11-25: qty 10

## 2011-11-25 MED ORDER — SODIUM CHLORIDE 0.9 % IV SOLN
1920.0000 mg/m2 | INTRAVENOUS | Status: DC
  Administered 2011-11-25: 3300 mg via INTRAVENOUS
  Filled 2011-11-25: qty 66

## 2011-11-25 MED ORDER — IRINOTECAN HCL CHEMO INJECTION 100 MG/5ML
150.0000 mg/m2 | Freq: Once | INTRAVENOUS | Status: AC
  Administered 2011-11-25: 258 mg via INTRAVENOUS
  Filled 2011-11-25: qty 12.9

## 2011-11-25 MED ORDER — SODIUM CHLORIDE 0.9 % IV SOLN: Freq: Once | INTRAVENOUS | Status: DC

## 2011-11-25 MED ORDER — FLUOROURACIL CHEMO INJECTION 2.5 GM/50ML
320.0000 mg/m2 | Freq: Once | INTRAVENOUS | Status: AC
  Administered 2011-11-25: 550 mg via INTRAVENOUS
  Filled 2011-11-25: qty 11

## 2011-11-25 MED ORDER — ATROPINE SULFATE 0.4 MG/ML IJ SOLN
0.2500 mg | INTRAMUSCULAR | Status: DC | PRN
  Filled 2011-11-25: qty 0.63

## 2011-11-25 MED ORDER — SODIUM CHLORIDE 0.9 % IV SOLN
5.0000 mg/kg | Freq: Once | INTRAVENOUS | Status: AC
  Administered 2011-11-25: 325 mg via INTRAVENOUS
  Filled 2011-11-25: qty 13

## 2011-11-25 MED ORDER — PALONOSETRON HCL INJECTION 0.25 MG/5ML
0.2500 mg | Freq: Once | INTRAVENOUS | Status: AC
  Administered 2011-11-25: 0.25 mg via INTRAVENOUS

## 2011-11-25 MED ORDER — LEUCOVORIN CALCIUM INJECTION 350 MG
400.0000 mg/m2 | Freq: Once | INTRAVENOUS | Status: AC
  Administered 2011-11-25: 688 mg via INTRAVENOUS
  Filled 2011-11-25: qty 34.4

## 2011-11-25 MED ORDER — DEXAMETHASONE SODIUM PHOSPHATE 4 MG/ML IJ SOLN: 20.0000 mg | Freq: Once | INTRAMUSCULAR | Status: DC

## 2011-11-25 NOTE — Progress Notes (Signed)
11/25/11 @ 10:00 am, [name] Cycle 24, Day 1:   Natalie Mccarthy, accompanied by her husband, into the [facility] to have labwork, to see Dr. Truett Perna and to receive treatment with FOLFIRI + bevacizumab per the [name] protocol.  Her labs were all appropriate for treatment, and the abnormal values were not clinically significant per Dr. Truett Perna.  She to have some intermittent, mild nose bleeds (epistaxis, grade 1), intermittent constipation and continued dysguesia.  She has not experienced any mucositis since 11/09/11 and has none today.  Her energy level is good and she continues to work (ECOG PS = 0). She was not able to obtain a urine sample upon arrival, so she has been drinking and will have to have the U/A done before start of bevacizumab.  Spoke with Reesa Chew, RN in the infusion room, about the required urine sample, the order and duration of the drugs.  Provided her with a sign with those details.  Natalie Mccarthy was able to provide a urine sample and the urinalysis was done prior to bevacizumab infusion.

## 2011-11-25 NOTE — Progress Notes (Signed)
OFFICE PROGRESS NOTE   INTERVAL HISTORY:   She completed another cycle of chemotherapy on 11/11/2011. She denies nausea and mouth sores following chemotherapy. She denies abdominal pain. She did not have a fever following the most recent cycle of chemotherapy. She reports a minor nosebleed yesterday.  Objective:  Vital signs in last 24 hours:  Blood pressure 126/78, pulse 80, temperature 98.3 F (36.8 C), temperature source Oral, height 5\' 8"  (1.727 m), weight 139 lb 9.6 oz (63.322 kg).    HEENT: No thrush or ulcers Resp: Lungs clear bilaterally Cardio: Regular rate and rhythm GI: Nontender, no hepatomegaly Vascular: No leg edema    Portacath/PICC-without erythema  Lab Results:  Lab Results  Component Value Date   WBC 9.3 11/25/2011   HGB 10.9* 11/25/2011   HCT 33.8* 11/25/2011   MCV 79.9 11/25/2011   PLT 115* 11/25/2011   ANC 8.0   Medications: I have reviewed the patient's current medications.  Assessment/Plan: 1.Metastatic colon cancer with multiple liver metastases noted on an MRI of the abdomen 12/02/2010 and on a CT 12/13/2010. A transverse colon mass was noted on the CT from 02/11/2011 and confirmed on a colonoscopy 12/17/2010. She began treatment with FOLFIRI/Avastin 12/31/2010. A restaging CT 06/20/2011 confirmed further improvement in the metastatic liver lesions and no evidence for progressive metastatic disease. A restaging CT 08/01/2011 showed stable liver lesions and no new lesions. Restaging CT 09/12/2011 showed stable liver lesions and no new lesions. A restaging CT 10/27/2011 showed stable liver lesions and no new lesions.  2. Abdominal pain secondary to hepatomegaly/liver metastases-resolved.  3. Anorexia, early satiety, and weight loss secondary to metastatic colon cancer-resolved.  4. Microcytic anemia.  5. History of migraine.  6. Status post uterine fibroid surgery.  7. Port-A-Cath placement 12/20/2010.  8. Delayed nausea following cycle 1 of  FOLFIRI/Avastin. Improved with the addition of Aloxi and prophylactic Decadron beginning with cycle #2.  9. Neutropenia secondary to chemotherapy. Cycle #4 was held on 02/11/2011. She has subsequently received Neulasta support.  10. Intermittent fever with no apparent source for infection, likely "tumor fever" or fever related to chemotherapy. She did not have a fever following the most recent cycle of chemotherapy.  11. Mucositis secondary to 5-FU, initially grade 2. Irinotecan and 5-FU were dose reduced per study guidelines.  12. Several episodes of abdominal cramping over the past 2 months-? Related to irinotecan, 5-FU, or potentially the colon tumor. She denies recent abdominal cramping.    Disposition:  She appears stable. The plan is to proceed with another cycle of FOLFIRI/Avastin today. She will decrease the Decadron dose to 4 mg twice daily on days 3 and 4. She will return for an office visit and restaging CT evaluation in 2 weeks.   Lucile Shutters, MD  11/25/2011  6:09 PM

## 2011-11-25 NOTE — Telephone Encounter (Signed)
gv pt appt for jan-feb2013 °

## 2011-11-25 NOTE — Progress Notes (Unsigned)
avastin line flushed with 50cc normal saline after it infused.

## 2011-11-27 ENCOUNTER — Ambulatory Visit (HOSPITAL_BASED_OUTPATIENT_CLINIC_OR_DEPARTMENT_OTHER): Payer: BC Managed Care – PPO

## 2011-11-27 VITALS — BP 122/75 | HR 87 | Temp 98.4°F

## 2011-11-27 DIAGNOSIS — Z5189 Encounter for other specified aftercare: Secondary | ICD-10-CM

## 2011-11-27 DIAGNOSIS — C189 Malignant neoplasm of colon, unspecified: Secondary | ICD-10-CM

## 2011-11-27 MED ORDER — PEGFILGRASTIM INJECTION 6 MG/0.6ML
6.0000 mg | Freq: Once | SUBCUTANEOUS | Status: AC
  Administered 2011-11-27: 6 mg via SUBCUTANEOUS
  Filled 2011-11-27: qty 0.6

## 2011-11-27 MED ORDER — SODIUM CHLORIDE 0.9 % IJ SOLN
10.0000 mL | INTRAMUSCULAR | Status: DC | PRN
  Administered 2011-11-27: 10 mL
  Filled 2011-11-27: qty 10

## 2011-11-27 MED ORDER — HEPARIN SOD (PORK) LOCK FLUSH 100 UNIT/ML IV SOLN
500.0000 [IU] | Freq: Once | INTRAVENOUS | Status: AC | PRN
  Administered 2011-11-27: 500 [IU]
  Filled 2011-11-27: qty 5

## 2011-12-02 ENCOUNTER — Other Ambulatory Visit: Payer: Self-pay | Admitting: Certified Registered Nurse Anesthetist

## 2011-12-05 ENCOUNTER — Ambulatory Visit (HOSPITAL_COMMUNITY)
Admission: RE | Admit: 2011-12-05 | Discharge: 2011-12-05 | Disposition: A | Discharge status: Home / Self Care | Payer: BC Managed Care – PPO | Source: Ambulatory Visit | Attending: Nurse Practitioner | Admitting: Nurse Practitioner

## 2011-12-05 DIAGNOSIS — C787 Secondary malignant neoplasm of liver and intrahepatic bile duct: Secondary | ICD-10-CM | POA: Insufficient documentation

## 2011-12-05 DIAGNOSIS — C189 Malignant neoplasm of colon, unspecified: Secondary | ICD-10-CM | POA: Insufficient documentation

## 2011-12-05 DIAGNOSIS — K838 Other specified diseases of biliary tract: Secondary | ICD-10-CM | POA: Insufficient documentation

## 2011-12-05 MED ORDER — IOHEXOL 300 MG/ML  SOLN
100.0000 mL | Freq: Once | INTRAMUSCULAR | Status: AC | PRN
  Administered 2011-12-05: 100 mL via INTRAVENOUS

## 2011-12-06 ENCOUNTER — Other Ambulatory Visit: Payer: Self-pay | Admitting: Oncology

## 2011-12-09 ENCOUNTER — Other Ambulatory Visit: Payer: Self-pay | Admitting: *Deleted

## 2011-12-09 ENCOUNTER — Ambulatory Visit: Payer: BC Managed Care – PPO

## 2011-12-09 ENCOUNTER — Telehealth: Payer: Self-pay | Admitting: Oncology

## 2011-12-09 ENCOUNTER — Other Ambulatory Visit (HOSPITAL_BASED_OUTPATIENT_CLINIC_OR_DEPARTMENT_OTHER): Payer: BC Managed Care – PPO

## 2011-12-09 ENCOUNTER — Ambulatory Visit (HOSPITAL_BASED_OUTPATIENT_CLINIC_OR_DEPARTMENT_OTHER): Payer: BC Managed Care – PPO | Admitting: Nurse Practitioner

## 2011-12-09 ENCOUNTER — Encounter: Payer: Self-pay | Admitting: *Deleted

## 2011-12-09 ENCOUNTER — Other Ambulatory Visit: Payer: Self-pay | Admitting: Certified Registered Nurse Anesthetist

## 2011-12-09 VITALS — BP 131/74 | HR 84 | Temp 97.2°F | Ht 68.0 in | Wt 139.1 lb

## 2011-12-09 DIAGNOSIS — C787 Secondary malignant neoplasm of liver and intrahepatic bile duct: Secondary | ICD-10-CM

## 2011-12-09 DIAGNOSIS — C189 Malignant neoplasm of colon, unspecified: Secondary | ICD-10-CM

## 2011-12-09 DIAGNOSIS — C184 Malignant neoplasm of transverse colon: Secondary | ICD-10-CM

## 2011-12-09 DIAGNOSIS — D649 Anemia, unspecified: Secondary | ICD-10-CM

## 2011-12-09 LAB — CBC WITH DIFFERENTIAL/PLATELET
BASO%: 0.5 % (ref 0.0–2.0)
Eosinophils Absolute: 0.2 10*3/uL (ref 0.0–0.5)
HCT: 35.1 % (ref 34.8–46.6)
LYMPH%: 10 % — ABNORMAL LOW (ref 14.0–49.7)
MONO#: 0.5 10*3/uL (ref 0.1–0.9)
NEUT#: 5.8 10*3/uL (ref 1.5–6.5)
NEUT%: 80.3 % — ABNORMAL HIGH (ref 38.4–76.8)
Platelets: 98 10*3/uL — ABNORMAL LOW (ref 145–400)
RBC: 4.35 10*6/uL (ref 3.70–5.45)
WBC: 7.3 10*3/uL (ref 3.9–10.3)
lymph#: 0.7 10*3/uL — ABNORMAL LOW (ref 0.9–3.3)
nRBC: 0 % (ref 0–0)

## 2011-12-09 LAB — COMPREHENSIVE METABOLIC PANEL
ALT: 34 U/L (ref 0–35)
AST: 32 U/L (ref 0–37)
Albumin: 3.4 g/dL — ABNORMAL LOW (ref 3.5–5.2)
CO2: 25 mEq/L (ref 19–32)
Calcium: 9.7 mg/dL (ref 8.4–10.5)
Chloride: 105 mEq/L (ref 96–112)
Creatinine, Ser: 0.78 mg/dL (ref 0.50–1.10)
Potassium: 3.8 mEq/L (ref 3.5–5.3)

## 2011-12-09 LAB — URINALYSIS, MICROSCOPIC - CHCC
Glucose: NEGATIVE g/dL
Nitrite: NEGATIVE
Protein: NEGATIVE mg/dL
Specific Gravity, Urine: 1.03 (ref 1.003–1.035)

## 2011-12-09 LAB — RESEARCH LABS

## 2011-12-09 NOTE — Progress Notes (Signed)
12/09/11 @10 :30am [name], Cycle 25, Day 1:  Mrs. Gilberto into the [facility] for labs, to see Lonna Cobb, NP and to receive treatment with FOLFIRI + bevacizumab.  She is doing well.  Her only complaint today is a new acne-like rash that has appeared on her face and chest today (grade 1).  She does confirm that she continues to have some mild epistaxis when blowing her nose in the morning (grade 1).  She denied any mucositis, fever, diarrhea or constipation this cycle.  Today Mrs. Lindell's platelet count was 98,000.  This was confirmed with a manual count; therefore, her treatment will be held today and rescheduled for one week.  12/16/11 @11 :20 am [name]:  Mrs. Graddy into the [facility] for a CBC, stat CMET , U/A, to see Lonna Cobb, NP and to receive FOLFIRI + bevacizumab.  Her counts were all appropriate for treatment today after having a one week delay due to thrombocytopenia.  She is reportedly doing well with no complaints and thoroughly enjoyed her extra week with no treatment.  She continues to work and have a performance status of 0.  Spoke Ceasar Mons, RN in the infusion area, about the treatment for today and provided her with a sign with order and duration of drugs. Emphasized the NS flush after bevacizumab and the 2 hour duration of the concurrent LV and irinotecan. Her future appointments were moved accordingly.

## 2011-12-09 NOTE — Telephone Encounter (Signed)
Tx  changed from 2/5 to 2/12. Per pof from 2/5 Linda L to call pt with instructions.   aom

## 2011-12-09 NOTE — Progress Notes (Unsigned)
Treatment held today due to PLT 98k. Pt to be rescheduled for one week.

## 2011-12-09 NOTE — Telephone Encounter (Signed)
appt made and printed for 01/06/12   aom °

## 2011-12-09 NOTE — Progress Notes (Unsigned)
On 11/25/11 decadron 20 mg pre med given i.v., cpt-11 and leucovorin given from 1157 to 1357, avastin given from 1127 to 1157. D Karolyna Bianchini rn

## 2011-12-09 NOTE — Progress Notes (Signed)
OFFICE PROGRESS NOTE  Interval history:  Natalie Mccarthy is a 56 year old woman with metastatic colon cancer. She is on active treatment with FOLFIRI/Avastin. She completed the most recent cycle 11/25/2011.  Restaging CT scan of the abdomen 12/05/2011 showed mild decrease in size of hepatic metastases and no evidence of disease progression.  Natalie Mccarthy reports that she feels well overall. No nausea or vomiting following the most recent cycle. No mouth sores. She has occasional loose stools. She takes Imodium as needed. She denies fever. She intermittently notes blood when she blows her nose. No shortness of breath, chest pain, leg swelling or calf pain.  Natalie Mccarthy noticed a rash on her face this morning.   Objective: Blood pressure 131/74, pulse 84, temperature 97.2 F (36.2 C), temperature source Oral, height 5\' 8"  (1.727 m), weight 139 lb 1.6 oz (63.095 kg).  Acne like rash scattered over the malar region.Oropharynx is without thrush or ulceration. Lungs are clear. No wheezes or rales. Regular cardiac rhythm. Port-A-Cath site is without erythema. Abdomen is soft and nontender. No hepatomegaly. Extremities are without edema. Calves are soft and nontender.   Lab Results: Lab Results  Component Value Date   WBC 7.3 12/09/2011   HGB 10.7* 12/09/2011   HCT 35.1 12/09/2011   MCV 80.7 12/09/2011   PLT 98* 12/09/2011    Chemistry:    Chemistry      Component Value Date/Time   NA 140 11/25/2011 0904   K 3.7 11/25/2011 0904   CL 105 11/25/2011 0904   CO2 26 11/25/2011 0904   BUN 12 11/25/2011 0904   CREATININE 0.83 11/25/2011 0904      Component Value Date/Time   CALCIUM 9.4 11/25/2011 0904   ALKPHOS 199* 11/25/2011 0904   AST 50* 11/25/2011 0904   ALT 64* 11/25/2011 0904   BILITOT 0.3 11/25/2011 0904       Studies/Results: Ct Abdomen W Contrast  12/05/2011  *RADIOLOGY REPORT*  Clinical Data:  Colon carcinoma with liver metastasis.  CT ABDOMEN WITH CONTRAST  Technique:  Multidetector CT imaging  of the abdomen was performed using the standard protocol following bolus administration of intravenous contrast.  Contrast: OMNIPAQUE IOHEXOL 300 MG/ML IV SOLN  Comparison:  CT 10/27/2011  RECIST protocol 1.1  RECIST target lesions:  1. Right hepatic lobe and medial segment left hepatic lobe lesion measures 52 mm  (image 20) compared to 59 mm on prior. 2.  Lateral segment left hepatic lobe lesion measures 20 mm (image 13) compared to 22 mm on prior.  RECIST non target lesions:  1. Posterior right hepatic lobe lesion measures 16 mm (image 16) compared to 18 mm on prior.  Findings:  Multi focal hepatic metastasis again demonstrated. Resist target lesions are used for comparison.  A 52 cm lesion in the central left hepatic lobe is slightly decreased from 59 mm on prior.  Superior left hepatic lobe lesion measures 20 mm slight decrease in 22 mm on prior.  Right hepatic lobe lesion measures 16 mm (image 16) decreased from 80 mm on prior.  No new lesions are identified.  There is mild intrahepatic biliary duct dilatation in segment  IVb. This is similar to prior.  The gallbladder, pancreas, spleen, adrenal glands, and kidneys are normal.  The stomach and limited view of the small bowel and colon are normal.  No evidence of abdominal lymphadenopathy.  IMPRESSION: 1.  Mild decrease in size of hepatic metastasis in the interval.   2.  No evidence of disease progression.  Original Report Authenticated By: Genevive Bi, M.D.    Medications: I have reviewed the patient's current medications.  Assessment/Plan:  1.Metastatic colon cancer with multiple liver metastases noted on an MRI of the abdomen 12/02/2010 and on a CT 12/13/2010. A transverse colon mass was noted on the CT from 02/11/2011 and confirmed on a colonoscopy 12/17/2010. She began treatment with FOLFIRI/Avastin 12/31/2010. A restaging CT 06/20/2011 confirmed further improvement in the metastatic liver lesions and no evidence for progressive metastatic  disease. A restaging CT 08/01/2011 showed stable liver lesions and no new lesions. Restaging CT 09/12/2011 showed stable liver lesions and no new lesions. A restaging CT 10/27/2011 showed stable liver lesions and no new lesions. Restaging CT 12/05/2011 showed mild decrease in size of hepatic metastases and no evidence of disease progression. 2. Abdominal pain secondary to hepatomegaly/liver metastases-resolved.  3. Anorexia, early satiety, and weight loss secondary to metastatic colon cancer-resolved.  4. Microcytic anemia.  5. History of migraine.  6. Status post uterine fibroid surgery.  7. Port-A-Cath placement 12/20/2010.  8. Delayed nausea following cycle 1 of FOLFIRI/Avastin. Improved with the addition of Aloxi and prophylactic Decadron beginning with cycle #2.  9. Neutropenia secondary to chemotherapy. Cycle #4 was held on 02/11/2011. She has subsequently received Neulasta support.  10. Intermittent fever with no apparent source for infection, likely "tumor fever" or fever related to chemotherapy. She did not have a fever following the most recent cycle of chemotherapy.  11. Mucositis secondary to 5-FU, initially grade 2. Irinotecan and 5-FU were dose reduced per study guidelines.  12. Several episodes of abdominal cramping over the past 2 months-? Related to irinotecan, 5-FU, or potentially the colon tumor. She denies recent abdominal cramping.  13. Thrombocytopenia secondary to chemotherapy. 14. Acne like rash on the face. Likely related to steroids.   Disposition-Natalie Mccarthy appears stable. Plan to proceed with another cycle of FOLFIRI/Avastin today as scheduled. We will decrease the Decadron premedication dose to 10 mg. She will return for a followup visit and the next cycle of chemotherapy in 2 weeks. She will contact the office the interim with any problems.   Lonna Cobb ANP/GNP-BC

## 2011-12-12 ENCOUNTER — Other Ambulatory Visit: Payer: Self-pay | Admitting: *Deleted

## 2011-12-12 MED ORDER — PANTOPRAZOLE SODIUM 40 MG PO TBEC: 40.0000 mg | DELAYED_RELEASE_TABLET | Freq: Every day | ORAL | Status: DC

## 2011-12-14 ENCOUNTER — Other Ambulatory Visit: Payer: Self-pay | Admitting: Oncology

## 2011-12-16 ENCOUNTER — Ambulatory Visit (HOSPITAL_BASED_OUTPATIENT_CLINIC_OR_DEPARTMENT_OTHER): Payer: BC Managed Care – PPO

## 2011-12-16 ENCOUNTER — Other Ambulatory Visit: Payer: BC Managed Care – PPO | Admitting: Lab

## 2011-12-16 ENCOUNTER — Encounter: Payer: Self-pay | Admitting: *Deleted

## 2011-12-16 ENCOUNTER — Telehealth: Payer: Self-pay | Admitting: Oncology

## 2011-12-16 ENCOUNTER — Ambulatory Visit (HOSPITAL_BASED_OUTPATIENT_CLINIC_OR_DEPARTMENT_OTHER): Payer: BC Managed Care – PPO | Admitting: Nurse Practitioner

## 2011-12-16 ENCOUNTER — Other Ambulatory Visit (HOSPITAL_BASED_OUTPATIENT_CLINIC_OR_DEPARTMENT_OTHER): Payer: BC Managed Care – PPO

## 2011-12-16 VITALS — BP 135/77 | HR 86 | Temp 96.9°F | Ht 68.0 in | Wt 142.3 lb

## 2011-12-16 VITALS — BP 118/78

## 2011-12-16 DIAGNOSIS — Z5111 Encounter for antineoplastic chemotherapy: Secondary | ICD-10-CM

## 2011-12-16 DIAGNOSIS — C787 Secondary malignant neoplasm of liver and intrahepatic bile duct: Secondary | ICD-10-CM

## 2011-12-16 DIAGNOSIS — C189 Malignant neoplasm of colon, unspecified: Secondary | ICD-10-CM

## 2011-12-16 DIAGNOSIS — D6959 Other secondary thrombocytopenia: Secondary | ICD-10-CM

## 2011-12-16 DIAGNOSIS — D649 Anemia, unspecified: Secondary | ICD-10-CM

## 2011-12-16 DIAGNOSIS — C184 Malignant neoplasm of transverse colon: Secondary | ICD-10-CM

## 2011-12-16 DIAGNOSIS — Z006 Encounter for examination for normal comparison and control in clinical research program: Secondary | ICD-10-CM

## 2011-12-16 LAB — CBC WITH DIFFERENTIAL/PLATELET
Basophils Absolute: 0 10*3/uL (ref 0.0–0.1)
Eosinophils Absolute: 0.2 10*3/uL (ref 0.0–0.5)
HGB: 10.7 g/dL — ABNORMAL LOW (ref 11.6–15.9)
MONO#: 0.4 10*3/uL (ref 0.1–0.9)
NEUT#: 4.4 10*3/uL (ref 1.5–6.5)
RDW: 25.2 % — ABNORMAL HIGH (ref 11.2–14.5)
lymph#: 0.5 10*3/uL — ABNORMAL LOW (ref 0.9–3.3)

## 2011-12-16 LAB — URINALYSIS, MICROSCOPIC - CHCC
Bilirubin (Urine): NEGATIVE
Ketones: NEGATIVE mg/dL
Protein: NEGATIVE mg/dL

## 2011-12-16 LAB — COMPREHENSIVE METABOLIC PANEL
Albumin: 3.3 g/dL — ABNORMAL LOW (ref 3.5–5.2)
BUN: 16 mg/dL (ref 6–23)
Calcium: 9.6 mg/dL (ref 8.4–10.5)
Chloride: 105 mEq/L (ref 96–112)
Glucose, Bld: 100 mg/dL — ABNORMAL HIGH (ref 70–99)
Potassium: 3.5 mEq/L (ref 3.5–5.3)

## 2011-12-16 MED ORDER — LEUCOVORIN CALCIUM INJECTION 350 MG
400.0000 mg/m2 | Freq: Once | INTRAVENOUS | Status: AC
  Administered 2011-12-16: 688 mg via INTRAVENOUS
  Filled 2011-12-16: qty 34.4

## 2011-12-16 MED ORDER — DEXAMETHASONE SODIUM PHOSPHATE 4 MG/ML IJ SOLN
10.0000 mg | Freq: Once | INTRAMUSCULAR | Status: AC
  Administered 2011-12-16: 10 mg via INTRAVENOUS

## 2011-12-16 MED ORDER — SODIUM CHLORIDE 0.9 % IV SOLN
5.0000 mg/kg | Freq: Once | INTRAVENOUS | Status: AC
  Administered 2011-12-16: 325 mg via INTRAVENOUS
  Filled 2011-12-16: qty 13

## 2011-12-16 MED ORDER — SODIUM CHLORIDE 0.9 % IV SOLN
1920.0000 mg/m2 | INTRAVENOUS | Status: DC
  Administered 2011-12-16: 3300 mg via INTRAVENOUS
  Filled 2011-12-16: qty 66

## 2011-12-16 MED ORDER — PALONOSETRON HCL INJECTION 0.25 MG/5ML
0.2500 mg | Freq: Once | INTRAVENOUS | Status: AC
  Administered 2011-12-16: 0.25 mg via INTRAVENOUS

## 2011-12-16 MED ORDER — SODIUM CHLORIDE 0.9 % IV SOLN
Freq: Once | INTRAVENOUS | Status: AC
  Administered 2011-12-16: 12:00:00 via INTRAVENOUS

## 2011-12-16 MED ORDER — IRINOTECAN HCL CHEMO INJECTION 100 MG/5ML
150.0000 mg/m2 | Freq: Once | INTRAVENOUS | Status: AC
  Administered 2011-12-16: 258 mg via INTRAVENOUS
  Filled 2011-12-16: qty 12.9

## 2011-12-16 MED ORDER — FLUOROURACIL CHEMO INJECTION 2.5 GM/50ML
320.0000 mg/m2 | Freq: Once | INTRAVENOUS | Status: AC
  Administered 2011-12-16: 550 mg via INTRAVENOUS
  Filled 2011-12-16: qty 11

## 2011-12-16 MED ORDER — SODIUM CHLORIDE 0.9 % IV SOLN
Freq: Once | INTRAVENOUS | Status: AC
  Administered 2011-12-16: 11:00:00 via INTRAVENOUS

## 2011-12-16 NOTE — Progress Notes (Signed)
OFFICE PROGRESS NOTE  Interval history:  Natalie Mccarthy is a 56 year old woman with metastatic colon cancer. She is on active treatment with FOLFIRI/Avastin. She completed the most recent cycle 11/25/2011. Treatment was held on 12/09/2011 due to a platelet count of 98,000 (per study guidelines). She is seen today for scheduled followup.  Natalie Mccarthy feels well. No nausea or vomiting. No diarrhea. She denies mouth sores. She denies bleeding. No shortness of breath or cough. No chest pain. No leg swelling or calf pain.   Objective: Blood pressure 135/77, pulse 86, temperature 96.9 F (36.1 C), temperature source Oral, height 5\' 8"  (1.727 m), weight 142 lb 4.8 oz (64.547 kg).  Oropharynx is without thrush or ulceration. Lungs are clear. No wheezes or rales. Port-A-Cath site is without erythema. Regular cardiac rhythm. Abdomen is soft and nontender. No hepatomegaly. Extremities are without edema. Calves are soft and nontender.  Lab Results: Lab Results  Component Value Date   WBC 5.6 12/16/2011   HGB 10.7* 12/16/2011   HCT 32.4* 12/16/2011   MCV 79.2* 12/16/2011   PLT 146 12/16/2011    Chemistry:    Chemistry      Component Value Date/Time   NA 141 12/09/2011 0949   K 3.8 12/09/2011 0949   CL 105 12/09/2011 0949   CO2 25 12/09/2011 0949   BUN 14 12/09/2011 0949   CREATININE 0.78 12/09/2011 0949      Component Value Date/Time   CALCIUM 9.7 12/09/2011 0949   ALKPHOS 183* 12/09/2011 0949   AST 32 12/09/2011 0949   ALT 34 12/09/2011 0949   BILITOT 0.3 12/09/2011 0949       Studies/Results: Ct Abdomen W Contrast  12/05/2011  *RADIOLOGY REPORT*  Clinical Data:  Colon carcinoma with liver metastasis.  CT ABDOMEN WITH CONTRAST  Technique:  Multidetector CT imaging of the abdomen was performed using the standard protocol following bolus administration of intravenous contrast.  Contrast: OMNIPAQUE IOHEXOL 300 MG/ML IV SOLN  Comparison:  CT 10/27/2011  RECIST protocol 1.1  RECIST target lesions:  1. Right  hepatic lobe and medial segment left hepatic lobe lesion measures 52 mm  (image 20) compared to 59 mm on prior. 2.  Lateral segment left hepatic lobe lesion measures 20 mm (image 13) compared to 22 mm on prior.  RECIST non target lesions:  1. Posterior right hepatic lobe lesion measures 16 mm (image 16) compared to 18 mm on prior.  Findings:  Multi focal hepatic metastasis again demonstrated. Resist target lesions are used for comparison.  A 52 cm lesion in the central left hepatic lobe is slightly decreased from 59 mm on prior.  Superior left hepatic lobe lesion measures 20 mm slight decrease in 22 mm on prior.  Right hepatic lobe lesion measures 16 mm (image 16) decreased from 80 mm on prior.  No new lesions are identified.  There is mild intrahepatic biliary duct dilatation in segment  IVb. This is similar to prior.  The gallbladder, pancreas, spleen, adrenal glands, and kidneys are normal.  The stomach and limited view of the small bowel and colon are normal.  No evidence of abdominal lymphadenopathy.  IMPRESSION: 1.  Mild decrease in size of hepatic metastasis in the interval.   2.  No evidence of disease progression.  Original Report Authenticated By: Genevive Bi, M.D.     Assessment/Plan:  1.Metastatic colon cancer with multiple liver metastases noted on an MRI of the abdomen 12/02/2010 and on a CT 12/13/2010. A transverse colon mass was noted  on the CT from 02/11/2011 and confirmed on a colonoscopy 12/17/2010. She began treatment with FOLFIRI/Avastin 12/31/2010. A restaging CT 06/20/2011 confirmed further improvement in the metastatic liver lesions and no evidence for progressive metastatic disease. A restaging CT 08/01/2011 showed stable liver lesions and no new lesions. Restaging CT 09/12/2011 showed stable liver lesions and no new lesions. A restaging CT 10/27/2011 showed stable liver lesions and no new lesions. Restaging CT 12/05/2011 showed mild decrease in size of hepatic metastases and no  evidence of disease progression.  2. Abdominal pain secondary to hepatomegaly/liver metastases-resolved.  3. Anorexia, early satiety, and weight loss secondary to metastatic colon cancer-resolved.  4. Microcytic anemia.  5. History of migraine.  6. Status post uterine fibroid surgery.  7. Port-A-Cath placement 12/20/2010.  8. Delayed nausea following cycle 1 of FOLFIRI/Avastin. Improved with the addition of Aloxi and prophylactic Decadron beginning with cycle #2.  9. Neutropenia secondary to chemotherapy. Cycle #4 was held on 02/11/2011. She has subsequently received Neulasta support.  10. Intermittent fever with no apparent source for infection, likely "tumor fever" or fever related to chemotherapy. She did not have a fever following the most recent cycle of chemotherapy.  11. Mucositis secondary to 5-FU, initially grade 2. Irinotecan and 5-FU were dose reduced per study guidelines.  12. Several episodes of abdominal cramping over the past 2 months-? Related to irinotecan, 5-FU, or potentially the colon tumor. She denies recent abdominal cramping.  13. Thrombocytopenia secondary to chemotherapy. Treatment was held 12/09/2011 secondary to thrombocytopenia per study guidelines.  Disposition-Natalie Mccarthy appears stable. The platelet count has adequately recovered. Plan to proceed with treatment today as scheduled. She will return for a followup visit and the next cycle in 2 weeks. She will contact the office the interim with any problems.  Plan reviewed with Dr. Truett Perna.   Lonna Cobb ANP/GNP-BC

## 2011-12-16 NOTE — Telephone Encounter (Signed)
Natalie Mccarthy chemo scheduler will give pt calenda today.

## 2011-12-18 ENCOUNTER — Ambulatory Visit (HOSPITAL_BASED_OUTPATIENT_CLINIC_OR_DEPARTMENT_OTHER): Payer: BC Managed Care – PPO

## 2011-12-18 VITALS — BP 115/72 | HR 85 | Temp 98.4°F

## 2011-12-18 DIAGNOSIS — C189 Malignant neoplasm of colon, unspecified: Secondary | ICD-10-CM

## 2011-12-18 DIAGNOSIS — Z5189 Encounter for other specified aftercare: Secondary | ICD-10-CM

## 2011-12-18 MED ORDER — PEGFILGRASTIM INJECTION 6 MG/0.6ML
6.0000 mg | Freq: Once | SUBCUTANEOUS | Status: AC
  Administered 2011-12-18: 6 mg via SUBCUTANEOUS
  Filled 2011-12-18: qty 0.6

## 2011-12-18 MED ORDER — SODIUM CHLORIDE 0.9 % IJ SOLN
10.0000 mL | INTRAMUSCULAR | Status: DC | PRN
  Administered 2011-12-18: 10 mL
  Filled 2011-12-18: qty 10

## 2011-12-18 MED ORDER — HEPARIN SOD (PORK) LOCK FLUSH 100 UNIT/ML IV SOLN
500.0000 [IU] | Freq: Once | INTRAVENOUS | Status: AC | PRN
  Administered 2011-12-18: 500 [IU]
  Filled 2011-12-18: qty 5

## 2011-12-23 ENCOUNTER — Ambulatory Visit: Payer: BC Managed Care – PPO

## 2011-12-23 ENCOUNTER — Ambulatory Visit: Payer: BC Managed Care – PPO | Admitting: Nurse Practitioner

## 2011-12-23 ENCOUNTER — Other Ambulatory Visit: Payer: BC Managed Care – PPO

## 2011-12-28 ENCOUNTER — Other Ambulatory Visit: Payer: Self-pay | Admitting: Oncology

## 2011-12-29 ENCOUNTER — Other Ambulatory Visit: Payer: Self-pay | Admitting: *Deleted

## 2011-12-29 DIAGNOSIS — C189 Malignant neoplasm of colon, unspecified: Secondary | ICD-10-CM

## 2011-12-30 ENCOUNTER — Other Ambulatory Visit (HOSPITAL_BASED_OUTPATIENT_CLINIC_OR_DEPARTMENT_OTHER): Payer: BC Managed Care – PPO | Admitting: Lab

## 2011-12-30 ENCOUNTER — Ambulatory Visit (HOSPITAL_BASED_OUTPATIENT_CLINIC_OR_DEPARTMENT_OTHER): Payer: BC Managed Care – PPO | Admitting: Nurse Practitioner

## 2011-12-30 ENCOUNTER — Telehealth: Payer: Self-pay | Admitting: Oncology

## 2011-12-30 ENCOUNTER — Other Ambulatory Visit: Payer: Self-pay | Admitting: *Deleted

## 2011-12-30 ENCOUNTER — Ambulatory Visit: Payer: BC Managed Care – PPO

## 2011-12-30 ENCOUNTER — Encounter: Payer: Self-pay | Admitting: *Deleted

## 2011-12-30 VITALS — BP 141/73 | HR 80 | Temp 97.2°F | Ht 68.0 in | Wt 142.0 lb

## 2011-12-30 DIAGNOSIS — C184 Malignant neoplasm of transverse colon: Secondary | ICD-10-CM

## 2011-12-30 DIAGNOSIS — C189 Malignant neoplasm of colon, unspecified: Secondary | ICD-10-CM

## 2011-12-30 LAB — COMPREHENSIVE METABOLIC PANEL
ALT: 29 U/L (ref 0–35)
AST: 28 U/L (ref 0–37)
Albumin: 3.5 g/dL (ref 3.5–5.2)
Alkaline Phosphatase: 208 U/L — ABNORMAL HIGH (ref 39–117)
Potassium: 3.7 mEq/L (ref 3.5–5.3)
Sodium: 139 mEq/L (ref 135–145)
Total Protein: 6.7 g/dL (ref 6.0–8.3)

## 2011-12-30 LAB — CBC WITH DIFFERENTIAL/PLATELET
EOS%: 2.3 % (ref 0.0–7.0)
Eosinophils Absolute: 0.2 10*3/uL (ref 0.0–0.5)
MCH: 26.2 pg (ref 25.1–34.0)
MCV: 80.1 fL (ref 79.5–101.0)
MONO%: 4.4 % (ref 0.0–14.0)
NEUT#: 8.1 10*3/uL — ABNORMAL HIGH (ref 1.5–6.5)
RBC: 4.24 10*6/uL (ref 3.70–5.45)
RDW: 25 % — ABNORMAL HIGH (ref 11.2–14.5)
lymph#: 0.7 10*3/uL — ABNORMAL LOW (ref 0.9–3.3)

## 2011-12-30 LAB — URINALYSIS, MICROSCOPIC - CHCC
Nitrite: NEGATIVE
Protein: NEGATIVE mg/dL
RBC count: NEGATIVE (ref 0–2)
pH: 5 (ref 4.6–8.0)

## 2011-12-30 NOTE — Telephone Encounter (Signed)
Appts moved,r/s and printed  aom

## 2011-12-30 NOTE — Progress Notes (Signed)
12/30/11 @ 13:10 pm, [name], Cycle 26:  Natalie Mccarthy into the [facility] for a CBC, stat CMET, urinalysis and to see Lonna Cobb, NP. Unfortunately, her platelet count was 96,000 so her treatment will be delayed for a least one week.  She is doing well with no new complaints.  She continues to have the mild epistaxis daily, dysguesea, and the mild acneiform rash across her cheeks.  She denies any mucositis.  [name] was rescheduled for chemotherapy in one week, along with labs and H&P.

## 2011-12-30 NOTE — Progress Notes (Signed)
OFFICE PROGRESS NOTE  Interval history:  Natalie Mccarthy is a 56 year old woman with metastatic colon cancer. She is on active treatment with FOLFIRI/Avastin. She completed the most recent cycle 12/16/2011. She is seen today for scheduled followup.  Natalie Mccarthy feels well. She denies nausea/vomiting. No diarrhea. No mouth sores. She denies bleeding. No calf pain. She denies shortness of breath. She intermittently notes a rash on her cheeks. She describes the rash as "red with bumps".   Objective: Blood pressure 141/73, pulse 80, temperature 97.2 F (36.2 C), temperature source Oral, height 5\' 8"  (1.727 m), weight 142 lb (64.411 kg).  Oropharynx is without thrush or ulceration. Malar regions with faint erythema and scattered acne-like skin lesions. Lungs are clear. Regular cardiac rhythm. Port-A-Cath site is without erythema. Abdomen is soft and nontender. No hepatomegaly. Extremities are without edema. Calves soft and nontender.  Lab Results: Lab Results  Component Value Date   WBC 9.4 12/30/2011   HGB 11.1* 12/30/2011   HCT 33.9* 12/30/2011   MCV 80.1 12/30/2011   PLT 96* 12/30/2011    Chemistry:    Chemistry      Component Value Date/Time   NA 139 12/30/2011 1038   K 3.7 12/30/2011 1038   CL 103 12/30/2011 1038   CO2 28 12/30/2011 1038   BUN 14 12/30/2011 1038   CREATININE 0.79 12/30/2011 1038      Component Value Date/Time   CALCIUM 9.7 12/30/2011 1038   ALKPHOS 208* 12/30/2011 1038   AST 28 12/30/2011 1038   ALT 29 12/30/2011 1038   BILITOT 0.3 12/30/2011 1038       Studies/Results: Ct Abdomen W Contrast  12/05/2011  *RADIOLOGY REPORT*  Clinical Data:  Colon carcinoma with liver metastasis.  CT ABDOMEN WITH CONTRAST  Technique:  Multidetector CT imaging of the abdomen was performed using the standard protocol following bolus administration of intravenous contrast.  Contrast: OMNIPAQUE IOHEXOL 300 MG/ML IV SOLN  Comparison:  CT 10/27/2011  RECIST protocol 1.1  RECIST target  lesions:  1. Right hepatic lobe and medial segment left hepatic lobe lesion measures 52 mm  (image 20) compared to 59 mm on prior. 2.  Lateral segment left hepatic lobe lesion measures 20 mm (image 13) compared to 22 mm on prior.  RECIST non target lesions:  1. Posterior right hepatic lobe lesion measures 16 mm (image 16) compared to 18 mm on prior.  Findings:  Multi focal hepatic metastasis again demonstrated. Resist target lesions are used for comparison.  A 52 cm lesion in the central left hepatic lobe is slightly decreased from 59 mm on prior.  Superior left hepatic lobe lesion measures 20 mm slight decrease in 22 mm on prior.  Right hepatic lobe lesion measures 16 mm (image 16) decreased from 80 mm on prior.  No new lesions are identified.  There is mild intrahepatic biliary duct dilatation in segment  IVb. This is similar to prior.  The gallbladder, pancreas, spleen, adrenal glands, and kidneys are normal.  The stomach and limited view of the small bowel and colon are normal.  No evidence of abdominal lymphadenopathy.  IMPRESSION: 1.  Mild decrease in size of hepatic metastasis in the interval.   2.  No evidence of disease progression.  Original Report Authenticated By: Genevive Bi, M.D.    Medications: I have reviewed the patient's current medications.  Assessment/Plan:  1.Metastatic colon cancer with multiple liver metastases noted on an MRI of the abdomen 12/02/2010 and on a CT 12/13/2010. A transverse colon  mass was noted on the CT from 02/11/2011 and confirmed on a colonoscopy 12/17/2010. She began treatment with FOLFIRI/Avastin 12/31/2010. A restaging CT 06/20/2011 confirmed further improvement in the metastatic liver lesions and no evidence for progressive metastatic disease. A restaging CT 08/01/2011 showed stable liver lesions and no new lesions. Restaging CT 09/12/2011 showed stable liver lesions and no new lesions. A restaging CT 10/27/2011 showed stable liver lesions and no new lesions.  Restaging CT 12/05/2011 showed mild decrease in size of hepatic metastases and no evidence of disease progression.  2. Abdominal pain secondary to hepatomegaly/liver metastases-resolved.  3. Anorexia, early satiety, and weight loss secondary to metastatic colon cancer-resolved.  4. Microcytic anemia.  5. History of migraine.  6. Status post uterine fibroid surgery.  7. Port-A-Cath placement 12/20/2010.  8. Delayed nausea following cycle 1 of FOLFIRI/Avastin. Improved with the addition of Aloxi and prophylactic Decadron beginning with cycle #2. The Decadron is slowly being tapered. 9. Neutropenia secondary to chemotherapy. Cycle #4 was held on 02/11/2011. She has subsequently received Neulasta support.  10. Intermittent fever with no apparent source for infection, likely "tumor fever" or fever related to chemotherapy. She did not have a fever following the most recent cycle of chemotherapy.  11. Mucositis secondary to 5-FU, initially grade 2. Irinotecan and 5-FU were dose reduced per study guidelines.  12. History of abdominal cramping. ? Related to irinotecan, 5-FU, or potentially the colon tumor. No recent abdominal cramping.  13. Thrombocytopenia secondary to chemotherapy. Treatment was held 12/09/2011 secondary to thrombocytopenia per study guidelines.  14. Acne-like rash on the face. Question related to steroids.  Disposition-Natalie Mccarthy appears stable. She is mildly thrombocytopenic on today's labs. Per the study guidelines we will delay treatment for one week. We will see her in followup on 01/06/2011 with plans to proceed with the next cycle of chemotherapy if the platelet count is greater than or equal to 100,000. She will contact the office the interim with any problems.  Plan reviewed with Dr. Truett Perna.     Lonna Cobb ANP/GNP-BC

## 2012-01-04 ENCOUNTER — Other Ambulatory Visit: Payer: Self-pay | Admitting: Oncology

## 2012-01-05 ENCOUNTER — Other Ambulatory Visit: Payer: Self-pay | Admitting: *Deleted

## 2012-01-06 ENCOUNTER — Other Ambulatory Visit: Payer: BC Managed Care – PPO | Admitting: Lab

## 2012-01-06 ENCOUNTER — Ambulatory Visit: Payer: BC Managed Care – PPO

## 2012-01-06 ENCOUNTER — Ambulatory Visit: Payer: BC Managed Care – PPO | Admitting: Nurse Practitioner

## 2012-01-06 ENCOUNTER — Other Ambulatory Visit (HOSPITAL_BASED_OUTPATIENT_CLINIC_OR_DEPARTMENT_OTHER): Payer: BC Managed Care – PPO | Admitting: Lab

## 2012-01-06 ENCOUNTER — Encounter: Payer: Self-pay | Admitting: *Deleted

## 2012-01-06 ENCOUNTER — Telehealth: Payer: Self-pay | Admitting: Oncology

## 2012-01-06 ENCOUNTER — Ambulatory Visit (HOSPITAL_BASED_OUTPATIENT_CLINIC_OR_DEPARTMENT_OTHER): Payer: BC Managed Care – PPO | Admitting: Nurse Practitioner

## 2012-01-06 VITALS — BP 144/73 | HR 90 | Temp 96.8°F | Ht 68.0 in | Wt 144.1 lb

## 2012-01-06 DIAGNOSIS — Z006 Encounter for examination for normal comparison and control in clinical research program: Secondary | ICD-10-CM

## 2012-01-06 DIAGNOSIS — C787 Secondary malignant neoplasm of liver and intrahepatic bile duct: Secondary | ICD-10-CM

## 2012-01-06 DIAGNOSIS — C189 Malignant neoplasm of colon, unspecified: Secondary | ICD-10-CM

## 2012-01-06 DIAGNOSIS — C184 Malignant neoplasm of transverse colon: Secondary | ICD-10-CM

## 2012-01-06 DIAGNOSIS — D702 Other drug-induced agranulocytosis: Secondary | ICD-10-CM

## 2012-01-06 DIAGNOSIS — D6959 Other secondary thrombocytopenia: Secondary | ICD-10-CM

## 2012-01-06 LAB — URINALYSIS, MICROSCOPIC - CHCC
Blood: NEGATIVE
Ketones: NEGATIVE mg/dL
Nitrite: NEGATIVE
Protein: NEGATIVE mg/dL
Specific Gravity, Urine: 1.03 (ref 1.003–1.035)
pH: 5 (ref 4.6–8.0)

## 2012-01-06 LAB — CBC WITH DIFFERENTIAL/PLATELET
BASO%: 0.1 % (ref 0.0–2.0)
Basophils Absolute: 0 10*3/uL (ref 0.0–0.1)
Eosinophils Absolute: 0.2 10*3/uL (ref 0.0–0.5)
HCT: 34.1 % — ABNORMAL LOW (ref 34.8–46.6)
HGB: 11 g/dL — ABNORMAL LOW (ref 11.6–15.9)
LYMPH%: 9.9 % — ABNORMAL LOW (ref 14.0–49.7)
MONO#: 0.6 10*3/uL (ref 0.1–0.9)
NEUT%: 78.4 % — ABNORMAL HIGH (ref 38.4–76.8)
Platelets: 93 10*3/uL — ABNORMAL LOW (ref 145–400)
WBC: 6.9 10*3/uL (ref 3.9–10.3)
lymph#: 0.7 10*3/uL — ABNORMAL LOW (ref 0.9–3.3)

## 2012-01-06 NOTE — Telephone Encounter (Signed)
called pt lmovm for appt on 03/12

## 2012-01-06 NOTE — Telephone Encounter (Signed)
pt rtn call and confirmed appt for 03/12

## 2012-01-06 NOTE — Progress Notes (Unsigned)
01/06/12 @ 10:15 am, [name], Cycle 26 Delayed a second time:  Mrs. Volk in to the [facility] for labs and to have FOLFIRI + bevacizumab.  Unfortunately, her platelets were too low to treat again today.  Her treatment will be delayed for one more week.  Cancelled the CMET since she will not receive treatment.

## 2012-01-06 NOTE — Progress Notes (Signed)
OFFICE PROGRESS NOTE  Interval history:  Natalie Mccarthy is a 56 year old woman with metastatic colon cancer. She is on active treatment with FOLFIRI/Avastin. She was last treated on 12/16/2011. Treatment was held to 12/30/2011 due to thrombocytopenia per study guidelines. She is seen today for scheduled followup.  Natalie Mccarthy feels well. She denies nausea/vomiting. No mouth sores. No diarrhea. She has a good appetite. She is gaining weight.   Objective: Blood pressure 144/73, pulse 90, temperature 96.8 F (36 C), temperature source Oral, height 5\' 8"  (1.727 m), weight 144 lb 1.6 oz (65.363 kg).  Oropharynx is without thrush or ulceration. No facial rash noted. Lungs are clear. Regular cardiac rhythm. Abdomen is soft and nontender. No organomegaly. Extremities are without edema. Calves are soft and nontender. Port-A-Cath site is without erythema.  Lab Results: Lab Results  Component Value Date   WBC 6.9 01/06/2012   HGB 11.0* 01/06/2012   HCT 34.1* 01/06/2012   MCV 80.4 01/06/2012   PLT 93* 01/06/2012    Chemistry:    Chemistry      Component Value Date/Time   NA 139 12/30/2011 1038   K 3.7 12/30/2011 1038   CL 103 12/30/2011 1038   CO2 28 12/30/2011 1038   BUN 14 12/30/2011 1038   CREATININE 0.79 12/30/2011 1038      Component Value Date/Time   CALCIUM 9.7 12/30/2011 1038   ALKPHOS 208* 12/30/2011 1038   AST 28 12/30/2011 1038   ALT 29 12/30/2011 1038   BILITOT 0.3 12/30/2011 1038       Studies/Results: No results found.  Medications: I have reviewed the patient's current medications.  Assessment/Plan:  1.Metastatic colon cancer with multiple liver metastases noted on an MRI of the abdomen 12/02/2010 and on a CT 12/13/2010. A transverse colon mass was noted on the CT from 02/11/2011 and confirmed on a colonoscopy 12/17/2010. She began treatment with FOLFIRI/Avastin 12/31/2010. A restaging CT 06/20/2011 confirmed further improvement in the metastatic liver lesions and no evidence for  progressive metastatic disease. A restaging CT 08/01/2011 showed stable liver lesions and no new lesions. Restaging CT 09/12/2011 showed stable liver lesions and no new lesions. A restaging CT 10/27/2011 showed stable liver lesions and no new lesions. Restaging CT 12/05/2011 showed mild decrease in size of hepatic metastases and no evidence of disease progression.  2. Abdominal pain secondary to hepatomegaly/liver metastases-resolved.  3. Anorexia, early satiety, and weight loss secondary to metastatic colon cancer-resolved.  4. Microcytic anemia.  5. History of migraine.  6. Status post uterine fibroid surgery.  7. Port-A-Cath placement 12/20/2010.  8. Delayed nausea following cycle 1 of FOLFIRI/Avastin. Improved with the addition of Aloxi and prophylactic Decadron beginning with cycle #2. The Decadron is slowly being tapered.  9. Neutropenia secondary to chemotherapy. Cycle #4 was held on 02/11/2011. She has subsequently received Neulasta support.  10. Intermittent fever with no apparent source for infection, likely "tumor fever" or fever related to chemotherapy. She did not have a fever following the most recent cycle of chemotherapy.  11. Mucositis secondary to 5-FU, initially grade 2. Irinotecan and 5-FU were dose reduced per study guidelines.  12. History of abdominal cramping. ? Related to irinotecan, 5-FU, or potentially the colon tumor. No recent abdominal cramping.  13. Thrombocytopenia secondary to chemotherapy. Treatment was held 12/09/2011 and 12/30/2011 secondary to thrombocytopenia per study guidelines.  14. Acne-like rash on the face. Question related to steroids. Improved. Disposition-Natalie Mccarthy appears stable. She continues to be thrombocytopenic. Per the study guidelines we will delay  treatment for an additional week. We will see her in followup on 01/13/2011 with plans to proceed with the next cycle of chemotherapy if the platelet count is greater than or equal to 100,000. She will  contact the office the interim with any problems.   Lonna Cobb ANP/GNP-BC

## 2012-01-11 ENCOUNTER — Other Ambulatory Visit: Payer: Self-pay | Admitting: Oncology

## 2012-01-13 ENCOUNTER — Ambulatory Visit (HOSPITAL_BASED_OUTPATIENT_CLINIC_OR_DEPARTMENT_OTHER): Payer: BC Managed Care – PPO

## 2012-01-13 ENCOUNTER — Other Ambulatory Visit: Payer: BC Managed Care – PPO | Admitting: Lab

## 2012-01-13 ENCOUNTER — Telehealth: Payer: Self-pay | Admitting: Oncology

## 2012-01-13 ENCOUNTER — Ambulatory Visit: Payer: BC Managed Care – PPO | Admitting: Nurse Practitioner

## 2012-01-13 ENCOUNTER — Ambulatory Visit (HOSPITAL_BASED_OUTPATIENT_CLINIC_OR_DEPARTMENT_OTHER): Payer: BC Managed Care – PPO | Admitting: Nurse Practitioner

## 2012-01-13 ENCOUNTER — Encounter: Payer: Self-pay | Admitting: *Deleted

## 2012-01-13 ENCOUNTER — Ambulatory Visit: Payer: BC Managed Care – PPO

## 2012-01-13 ENCOUNTER — Other Ambulatory Visit (HOSPITAL_BASED_OUTPATIENT_CLINIC_OR_DEPARTMENT_OTHER): Payer: BC Managed Care – PPO | Admitting: Lab

## 2012-01-13 VITALS — BP 126/76 | HR 80 | Temp 97.3°F | Ht 68.0 in | Wt 144.1 lb

## 2012-01-13 DIAGNOSIS — C189 Malignant neoplasm of colon, unspecified: Secondary | ICD-10-CM

## 2012-01-13 DIAGNOSIS — C184 Malignant neoplasm of transverse colon: Secondary | ICD-10-CM

## 2012-01-13 DIAGNOSIS — D509 Iron deficiency anemia, unspecified: Secondary | ICD-10-CM

## 2012-01-13 DIAGNOSIS — Z5111 Encounter for antineoplastic chemotherapy: Secondary | ICD-10-CM

## 2012-01-13 DIAGNOSIS — C787 Secondary malignant neoplasm of liver and intrahepatic bile duct: Secondary | ICD-10-CM

## 2012-01-13 LAB — COMPREHENSIVE METABOLIC PANEL WITH GFR
ALT: 24 U/L (ref 0–35)
AST: 31 U/L (ref 0–37)
Albumin: 3.7 g/dL (ref 3.5–5.2)
Alkaline Phosphatase: 185 U/L — ABNORMAL HIGH (ref 39–117)
BUN: 17 mg/dL (ref 6–23)
CO2: 30 meq/L (ref 19–32)
Calcium: 9.8 mg/dL (ref 8.4–10.5)
Chloride: 102 meq/L (ref 96–112)
Creatinine, Ser: 0.73 mg/dL (ref 0.50–1.10)
Glucose, Bld: 89 mg/dL (ref 70–99)
Potassium: 4 meq/L (ref 3.5–5.3)
Sodium: 140 meq/L (ref 135–145)
Total Bilirubin: 0.3 mg/dL (ref 0.3–1.2)
Total Protein: 6.9 g/dL (ref 6.0–8.3)

## 2012-01-13 LAB — URINALYSIS, MICROSCOPIC - CHCC
Bilirubin (Urine): NEGATIVE
Ketones: NEGATIVE mg/dL
Specific Gravity, Urine: 1.03 (ref 1.003–1.035)
pH: 5 (ref 4.6–8.0)

## 2012-01-13 LAB — CBC WITH DIFFERENTIAL/PLATELET
Basophils Absolute: 0 10*3/uL (ref 0.0–0.1)
Eosinophils Absolute: 0.3 10*3/uL (ref 0.0–0.5)
HCT: 34.3 % — ABNORMAL LOW (ref 34.8–46.6)
HGB: 10.9 g/dL — ABNORMAL LOW (ref 11.6–15.9)
MONO#: 0.5 10*3/uL (ref 0.1–0.9)
NEUT#: 3.1 10*3/uL (ref 1.5–6.5)
NEUT%: 69.7 % (ref 38.4–76.8)
WBC: 4.5 10*3/uL (ref 3.9–10.3)
lymph#: 0.5 10*3/uL — ABNORMAL LOW (ref 0.9–3.3)

## 2012-01-13 MED ORDER — SODIUM CHLORIDE 0.9 % IV SOLN
Freq: Once | INTRAVENOUS | Status: AC
  Administered 2012-01-13: 12:00:00 via INTRAVENOUS

## 2012-01-13 MED ORDER — SODIUM CHLORIDE 0.9 % IV SOLN
1920.0000 mg/m2 | INTRAVENOUS | Status: DC
  Administered 2012-01-13: 3300 mg via INTRAVENOUS
  Filled 2012-01-13: qty 66

## 2012-01-13 MED ORDER — FLUOROURACIL CHEMO INJECTION 2.5 GM/50ML
320.0000 mg/m2 | Freq: Once | INTRAVENOUS | Status: AC
  Administered 2012-01-13: 550 mg via INTRAVENOUS
  Filled 2012-01-13: qty 11

## 2012-01-13 MED ORDER — SODIUM CHLORIDE 0.9 % IV SOLN
5.0000 mg/kg | Freq: Once | INTRAVENOUS | Status: AC
  Administered 2012-01-13: 325 mg via INTRAVENOUS
  Filled 2012-01-13: qty 13

## 2012-01-13 MED ORDER — PALONOSETRON HCL INJECTION 0.25 MG/5ML
0.2500 mg | Freq: Once | INTRAVENOUS | Status: AC
  Administered 2012-01-13: 0.25 mg via INTRAVENOUS

## 2012-01-13 MED ORDER — IRINOTECAN HCL CHEMO INJECTION 100 MG/5ML
150.0000 mg/m2 | Freq: Once | INTRAVENOUS | Status: AC
  Administered 2012-01-13: 258 mg via INTRAVENOUS
  Filled 2012-01-13: qty 12.9

## 2012-01-13 MED ORDER — ATROPINE SULFATE 0.4 MG/ML IJ SOLN
0.2500 mg | INTRAMUSCULAR | Status: DC | PRN
  Filled 2012-01-13: qty 0.63

## 2012-01-13 MED ORDER — LEUCOVORIN CALCIUM INJECTION 350 MG
400.0000 mg/m2 | Freq: Once | INTRAVENOUS | Status: AC
  Administered 2012-01-13: 688 mg via INTRAVENOUS
  Filled 2012-01-13: qty 34.4

## 2012-01-13 MED ORDER — DEXAMETHASONE SODIUM PHOSPHATE 4 MG/ML IJ SOLN
20.0000 mg | Freq: Once | INTRAMUSCULAR | Status: AC
  Administered 2012-01-13: 10 mg via INTRAVENOUS

## 2012-01-13 MED ORDER — SODIUM CHLORIDE 0.9 % IV SOLN
Freq: Once | INTRAVENOUS | Status: AC
  Administered 2012-01-13: 13:00:00 via INTRAVENOUS

## 2012-01-13 NOTE — Progress Notes (Signed)
01/13/12. [name], cycle 26 Natalie Mccarthy is in the cancer center today for lab work and assessment before receiving cycle 26 on the [name] clinical trial.  She reports her energy level  is good and she is at full activity. ECOG PS is 0.  Her lab work in within normal limits for treatment today and she is glad to be receiving treatment.  She saw L.Thomas and Dr. Sherrill.  Per Dr. Sherrill, abnormal lab results today are not clinically significant.  She will receive treatment today and return in 2 weeks for the next treatment. A CT exam is scheduled before the next appointment.     

## 2012-01-13 NOTE — Progress Notes (Signed)
OFFICE PROGRESS NOTE  Interval history:  Natalie Mccarthy is a 56 year old woman with metastatic colon cancer. She is on active treatment with FOLFIRI/Avastin. She was last treated on 12/16/2011. Treatment was held on 12/30/2011 and 01/06/2012 due to thrombocytopenia per study guidelines. She is seen today for scheduled followup.  [name] feels well. She denies nausea/vomiting. No mouth sores. No diarrhea. She has a good appetite. She denies abdominal pain. No bleeding. No shortness of breath or chest pain. She denies leg swelling or calf pain.  She reports intermittent aching pain in multiple joints including the hips, right knee and ankles.   Objective: Blood pressure 126/76, pulse 80, temperature 97.3 F (36.3 C), temperature source Oral, height 5\' 8"  (1.727 m), weight 144 lb 1.6 oz (65.363 kg).  Oropharynx is without thrush or ulceration. Lungs are clear. Regular cardiac rhythm. Abdomen is soft and nontender. No organomegaly. Extremities are without edema. Calves are soft and nontender. Port-A-Cath site is without erythema.  Lab Results: Lab Results  Component Value Date   WBC 4.5 01/13/2012   HGB 10.9* 01/13/2012   HCT 34.3* 01/13/2012   MCV 80.6 01/13/2012   PLT 118* 01/13/2012    Chemistry:    Chemistry      Component Value Date/Time   NA 139 12/30/2011 1038   K 3.7 12/30/2011 1038   CL 103 12/30/2011 1038   CO2 28 12/30/2011 1038   BUN 14 12/30/2011 1038   CREATININE 0.79 12/30/2011 1038      Component Value Date/Time   CALCIUM 9.7 12/30/2011 1038   ALKPHOS 208* 12/30/2011 1038   AST 28 12/30/2011 1038   ALT 29 12/30/2011 1038   BILITOT 0.3 12/30/2011 1038       Studies/Results: No results found.  Medications: I have reviewed the patient's current medications.  Assessment/Plan:  1.Metastatic colon cancer with multiple liver metastases noted on an MRI of the abdomen 12/02/2010 and on a CT 12/13/2010. A transverse colon mass was noted on the CT from 02/11/2011 and confirmed  on a colonoscopy 12/17/2010. She began treatment with FOLFIRI/Avastin 12/31/2010. A restaging CT 06/20/2011 confirmed further improvement in the metastatic liver lesions and no evidence for progressive metastatic disease. A restaging CT 08/01/2011 showed stable liver lesions and no new lesions. Restaging CT 09/12/2011 showed stable liver lesions and no new lesions. A restaging CT 10/27/2011 showed stable liver lesions and no new lesions. Restaging CT 12/05/2011 showed mild decrease in size of hepatic metastases and no evidence of disease progression.  2. Abdominal pain secondary to hepatomegaly/liver metastases-resolved.  3. Anorexia, early satiety, and weight loss secondary to metastatic colon cancer-resolved.  4. Microcytic anemia.  5. History of migraine.  6. Status post uterine fibroid surgery.  7. Port-A-Cath placement 12/20/2010.  8. Delayed nausea following cycle 1 of FOLFIRI/Avastin. Improved with the addition of Aloxi and prophylactic Decadron beginning with cycle #2. The Decadron is slowly being tapered.  9. Neutropenia secondary to chemotherapy. Cycle #4 was held on 02/11/2011. She has subsequently received Neulasta support.  10. Intermittent fever with no apparent source for infection, likely "tumor fever" or fever related to chemotherapy. She did not have a fever following the most recent cycle of chemotherapy.  11. Mucositis secondary to 5-FU, initially grade 2. Irinotecan and 5-FU were dose reduced per study guidelines.  12. History of abdominal cramping. ? Related to irinotecan, 5-FU, or potentially the colon tumor. No recent abdominal cramping.  13. Thrombocytopenia secondary to chemotherapy. Treatment was held 12/09/2011, 12/30/2011 and 01/06/2012 secondary to thrombocytopenia  per study guidelines.  14. Acne-like rash on the face. Question related to steroids. Improved.  Disposition-Ms. Bunnell appears stable. The platelet count is better. Per the study guidelines we will proceed  with treatment today as planned. We will schedule a restaging abdominal CT scan 01/23/2012. We will see her in followup on 01/27/2012.  She will contact the office in the interim with any problems.  Plan reviewed with Dr. Truett Perna.   Lonna Cobb ANP/GNP-BC

## 2012-01-13 NOTE — Telephone Encounter (Signed)
Gave pt calendar appt for March and oral contrast for CT scheduled for 01/23/12 @ [facility]

## 2012-01-15 ENCOUNTER — Ambulatory Visit (HOSPITAL_BASED_OUTPATIENT_CLINIC_OR_DEPARTMENT_OTHER): Payer: BC Managed Care – PPO

## 2012-01-15 DIAGNOSIS — Z5189 Encounter for other specified aftercare: Secondary | ICD-10-CM

## 2012-01-15 DIAGNOSIS — C184 Malignant neoplasm of transverse colon: Secondary | ICD-10-CM

## 2012-01-15 DIAGNOSIS — C189 Malignant neoplasm of colon, unspecified: Secondary | ICD-10-CM

## 2012-01-15 DIAGNOSIS — C787 Secondary malignant neoplasm of liver and intrahepatic bile duct: Secondary | ICD-10-CM

## 2012-01-15 MED ORDER — PEGFILGRASTIM INJECTION 6 MG/0.6ML
6.0000 mg | Freq: Once | SUBCUTANEOUS | Status: AC
  Administered 2012-01-15: 6 mg via SUBCUTANEOUS
  Filled 2012-01-15: qty 0.6

## 2012-01-15 MED ORDER — HEPARIN SOD (PORK) LOCK FLUSH 100 UNIT/ML IV SOLN
500.0000 [IU] | Freq: Once | INTRAVENOUS | Status: AC | PRN
  Administered 2012-01-15: 500 [IU]
  Filled 2012-01-15: qty 5

## 2012-01-15 MED ORDER — SODIUM CHLORIDE 0.9 % IJ SOLN
10.0000 mL | INTRAMUSCULAR | Status: DC | PRN
  Administered 2012-01-15: 10 mL
  Filled 2012-01-15: qty 10

## 2012-01-20 ENCOUNTER — Other Ambulatory Visit: Payer: BC Managed Care – PPO | Admitting: Lab

## 2012-01-20 ENCOUNTER — Ambulatory Visit: Payer: BC Managed Care – PPO | Admitting: Oncology

## 2012-01-20 ENCOUNTER — Ambulatory Visit: Payer: BC Managed Care – PPO

## 2012-01-23 ENCOUNTER — Other Ambulatory Visit: Payer: Self-pay | Admitting: *Deleted

## 2012-01-23 ENCOUNTER — Other Ambulatory Visit (HOSPITAL_COMMUNITY): Payer: BC Managed Care – PPO

## 2012-01-23 ENCOUNTER — Inpatient Hospital Stay (HOSPITAL_COMMUNITY): Admission: RE | Admit: 2012-01-23 | Payer: BC Managed Care – PPO | Source: Ambulatory Visit

## 2012-01-23 NOTE — Progress Notes (Deleted)
01/13/12. [name], cycle 62 [name] is in the cancer center today for lab work and assessment before receiving cycle 26 on the [name] clinical trial.  She reports her energy level  is good and she is at full activity. ECOG PS is 0.  Her lab work in within normal limits for treatment today and she is glad to be receiving treatment.  She saw L.Thomas and Dr. Truett Perna.  Per Dr. Truett Perna, abnormal lab results today are not clinically significant.  She will receive treatment today and return in 2 weeks for the next treatment. A CT exam is scheduled before the next appointment.

## 2012-01-26 ENCOUNTER — Other Ambulatory Visit: Payer: Self-pay | Admitting: *Deleted

## 2012-01-26 ENCOUNTER — Ambulatory Visit (HOSPITAL_COMMUNITY)
Admission: RE | Admit: 2012-01-26 | Discharge: 2012-01-26 | Disposition: A | Discharge status: Home / Self Care | Payer: BC Managed Care – PPO | Source: Ambulatory Visit | Attending: Nurse Practitioner | Admitting: Nurse Practitioner

## 2012-01-26 DIAGNOSIS — C189 Malignant neoplasm of colon, unspecified: Secondary | ICD-10-CM

## 2012-01-26 DIAGNOSIS — C787 Secondary malignant neoplasm of liver and intrahepatic bile duct: Secondary | ICD-10-CM | POA: Insufficient documentation

## 2012-01-26 MED ORDER — IOHEXOL 300 MG/ML  SOLN
100.0000 mL | Freq: Once | INTRAMUSCULAR | Status: AC | PRN
  Administered 2012-01-26: 100 mL via INTRAVENOUS

## 2012-01-27 ENCOUNTER — Ambulatory Visit (HOSPITAL_BASED_OUTPATIENT_CLINIC_OR_DEPARTMENT_OTHER): Payer: BC Managed Care – PPO | Admitting: Nurse Practitioner

## 2012-01-27 ENCOUNTER — Ambulatory Visit (HOSPITAL_BASED_OUTPATIENT_CLINIC_OR_DEPARTMENT_OTHER): Payer: BC Managed Care – PPO

## 2012-01-27 ENCOUNTER — Other Ambulatory Visit: Payer: Self-pay | Admitting: Oncology

## 2012-01-27 ENCOUNTER — Telehealth: Payer: Self-pay | Admitting: Oncology

## 2012-01-27 ENCOUNTER — Other Ambulatory Visit (HOSPITAL_BASED_OUTPATIENT_CLINIC_OR_DEPARTMENT_OTHER): Payer: BC Managed Care – PPO | Admitting: Lab

## 2012-01-27 ENCOUNTER — Encounter: Payer: Self-pay | Admitting: *Deleted

## 2012-01-27 VITALS — BP 119/78 | HR 76 | Temp 97.0°F | Ht 68.0 in | Wt 143.7 lb

## 2012-01-27 VITALS — BP 116/64 | HR 82

## 2012-01-27 DIAGNOSIS — C189 Malignant neoplasm of colon, unspecified: Secondary | ICD-10-CM

## 2012-01-27 DIAGNOSIS — C787 Secondary malignant neoplasm of liver and intrahepatic bile duct: Secondary | ICD-10-CM

## 2012-01-27 DIAGNOSIS — C184 Malignant neoplasm of transverse colon: Secondary | ICD-10-CM

## 2012-01-27 DIAGNOSIS — Z5111 Encounter for antineoplastic chemotherapy: Secondary | ICD-10-CM

## 2012-01-27 DIAGNOSIS — D702 Other drug-induced agranulocytosis: Secondary | ICD-10-CM

## 2012-01-27 DIAGNOSIS — R21 Rash and other nonspecific skin eruption: Secondary | ICD-10-CM

## 2012-01-27 DIAGNOSIS — Z006 Encounter for examination for normal comparison and control in clinical research program: Secondary | ICD-10-CM

## 2012-01-27 LAB — CBC WITH DIFFERENTIAL/PLATELET
BASO%: 0.1 % (ref 0.0–2.0)
Eosinophils Absolute: 0.2 10*3/uL (ref 0.0–0.5)
HCT: 34.4 % — ABNORMAL LOW (ref 34.8–46.6)
LYMPH%: 6.4 % — ABNORMAL LOW (ref 14.0–49.7)
MCHC: 32.2 g/dL (ref 31.5–36.0)
MONO#: 0.3 10*3/uL (ref 0.1–0.9)
NEUT#: 7.5 10*3/uL — ABNORMAL HIGH (ref 1.5–6.5)
NEUT%: 87.2 % — ABNORMAL HIGH (ref 38.4–76.8)
Platelets: 91 10*3/uL — ABNORMAL LOW (ref 145–400)
RBC: 4.31 10*6/uL (ref 3.70–5.45)
WBC: 8.6 10*3/uL (ref 3.9–10.3)
lymph#: 0.5 10*3/uL — ABNORMAL LOW (ref 0.9–3.3)

## 2012-01-27 LAB — URINALYSIS, MICROSCOPIC - CHCC
Blood: NEGATIVE
Glucose: NEGATIVE g/dL
Ketones: NEGATIVE mg/dL
Nitrite: NEGATIVE
Specific Gravity, Urine: 1.03 (ref 1.003–1.035)
WBC, UA: NEGATIVE (ref 0–2)

## 2012-01-27 LAB — CMP (CANCER CENTER ONLY)
ALT(SGPT): 41 U/L (ref 10–47)
AST: 34 U/L (ref 11–38)
Albumin: 3.5 g/dL (ref 3.3–5.5)
Alkaline Phosphatase: 198 U/L — ABNORMAL HIGH (ref 26–84)
Potassium: 3.8 mEq/L (ref 3.3–4.7)
Sodium: 143 mEq/L (ref 128–145)
Total Bilirubin: 0.5 mg/dl (ref 0.20–1.60)
Total Protein: 6.9 g/dL (ref 6.4–8.1)

## 2012-01-27 MED ORDER — SODIUM CHLORIDE 0.9 % IV SOLN
Freq: Once | INTRAVENOUS | Status: AC
  Administered 2012-01-27: 15:00:00 via INTRAVENOUS

## 2012-01-27 MED ORDER — IRINOTECAN HCL CHEMO INJECTION 100 MG/5ML
150.0000 mg/m2 | Freq: Once | INTRAVENOUS | Status: AC
  Administered 2012-01-27: 258 mg via INTRAVENOUS
  Filled 2012-01-27: qty 12.9

## 2012-01-27 MED ORDER — ATROPINE SULFATE 0.4 MG/ML IJ SOLN
0.2500 mg | INTRAMUSCULAR | Status: DC | PRN
  Filled 2012-01-27: qty 0.63

## 2012-01-27 MED ORDER — SODIUM CHLORIDE 0.9 % IV SOLN
5.0000 mg/kg | Freq: Once | INTRAVENOUS | Status: AC
  Administered 2012-01-27: 325 mg via INTRAVENOUS
  Filled 2012-01-27: qty 13

## 2012-01-27 MED ORDER — FLUOROURACIL CHEMO INJECTION 2.5 GM/50ML
320.0000 mg/m2 | Freq: Once | INTRAVENOUS | Status: AC
  Administered 2012-01-27: 550 mg via INTRAVENOUS
  Filled 2012-01-27: qty 11

## 2012-01-27 MED ORDER — DEXAMETHASONE SODIUM PHOSPHATE 4 MG/ML IJ SOLN
10.0000 mg | Freq: Once | INTRAMUSCULAR | Status: AC
  Administered 2012-01-27: 10 mg via INTRAVENOUS

## 2012-01-27 MED ORDER — PALONOSETRON HCL INJECTION 0.25 MG/5ML
0.2500 mg | Freq: Once | INTRAVENOUS | Status: AC
  Administered 2012-01-27: 0.25 mg via INTRAVENOUS

## 2012-01-27 MED ORDER — SODIUM CHLORIDE 0.9 % IV SOLN
1920.0000 mg/m2 | INTRAVENOUS | Status: DC
  Administered 2012-01-27: 3300 mg via INTRAVENOUS
  Filled 2012-01-27: qty 66

## 2012-01-27 MED ORDER — SODIUM CHLORIDE 0.9 % IV SOLN
Freq: Once | INTRAVENOUS | Status: AC
  Administered 2012-01-27: 16:00:00 via INTRAVENOUS

## 2012-01-27 MED ORDER — LEUCOVORIN CALCIUM INJECTION 350 MG
400.0000 mg/m2 | Freq: Once | INTRAVENOUS | Status: AC
  Administered 2012-01-27: 688 mg via INTRAVENOUS
  Filled 2012-01-27: qty 34.4

## 2012-01-27 NOTE — Progress Notes (Signed)
01/27/2012, [name], cycle 27. 1500 Natalie Mccarthy is in the cancer center today for lab work physical exam, and treatment with FOLFIRI and bevacizumzb on the [name] clinical trial.  She had a CT of the abdomen on 3/25 which shows stable disease. The scan was originally scheduled for Friday, 3/22 but she reports she had some abdominal cramping that day and was concerned about taking the oral contrast. She called radiology and was rescheduled for 3/25.   Natalie Mccarthy continues to work. Her ECOG PS is 0. She reports she continues to have a small amount of epistaxis, taste changes, and intermittent constipation. She still has a slight rash on her face and continues to have some arthralgias. She is not taking any medication for the arthralgias.   Labs reviewed by Dr. Truett Perna. After receiving communication from the medical monitor for the study, He has decided to proceed with treatment with the platelet count at 91,000. He does not find other abnormal lab results to be clinically significant.  Addendum, 02/02/2012 Clarification of oral decadron dose after chemotherapy. Per Natalie Mccarthy ; After cycle 25, took Decadron, 4 mg.  2X /day for 2 days.  After cycle 26, took Decadron 4 mg. 1X /day for 2 days.

## 2012-01-27 NOTE — Patient Instructions (Signed)
[facility] Discharge Instructions for Patients Receiving Chemotherapy  Today you received the following chemotherapy agents: Avastin, Leucovorin, Camptosar, 5FU.  To help prevent nausea and vomiting after your treatment, we encourage you to take your nausea medication as prescribed by your physician.   If you develop nausea and vomiting that is not controlled by your nausea medication, call the clinic.    BELOW ARE SYMPTOMS THAT SHOULD BE REPORTED IMMEDIATELY:  *FEVER GREATER THAN 100.5 F  *CHILLS WITH OR WITHOUT FEVER  NAUSEA AND VOMITING THAT IS NOT CONTROLLED WITH YOUR NAUSEA MEDICATION  *UNUSUAL SHORTNESS OF BREATH  *UNUSUAL BRUISING OR BLEEDING  TENDERNESS IN MOUTH AND THROAT WITH OR WITHOUT PRESENCE OF ULCERS  *URINARY PROBLEMS  *BOWEL PROBLEMS  UNUSUAL RASH Items with * indicate a potential emergency and should be followed up as soon as possible.  Feel free to call the clinic you have any questions or concerns. The clinic phone number is [phone].

## 2012-01-27 NOTE — Telephone Encounter (Signed)
gv pt appt schedule for march/april °

## 2012-01-27 NOTE — Progress Notes (Signed)
OFFICE PROGRESS NOTE  Interval history:  Natalie Mccarthy returns as scheduled. She was last treated with FOLFIRI/Avastin on 01/13/2012. Restaging CT evaluation showed multiple hepatic metastases without definite evidence of progression. No new lesions were identified 01/26/2012.  She denies mouth sores. No diarrhea. She had very mild nausea after the chemotherapy. No vomiting. She denies chest pain and shortness of breath. No leg swelling or calf pain. No abdominal pain except "cramping" discomfort before a bowel movement. She intermittently notes blood with nose blowing. No other bleeding.   Objective: Blood pressure 119/78, pulse 76, temperature 97 F (36.1 C), temperature source Oral, height 5\' 8"  (1.727 m), weight 143 lb 11.2 oz (65.182 kg).  Oropharynx is without thrush or ulceration. Lungs are clear. No wheezes or rales. Regular cardiac rhythm. Port-A-Cath site is without erythema. Abdomen is soft and nontender. No hepatomegaly. Extremities are without edema. Calves are soft and nontender.  Lab Results: Lab Results  Component Value Date   WBC 8.6 01/27/2012   HGB 11.1* 01/27/2012   HCT 34.4* 01/27/2012   MCV 79.8 01/27/2012   PLT 91* 01/27/2012    Chemistry:    Chemistry      Component Value Date/Time   NA 140 01/13/2012 1037   K 4.0 01/13/2012 1037   CL 102 01/13/2012 1037   CO2 30 01/13/2012 1037   BUN 17 01/13/2012 1037   CREATININE 0.73 01/13/2012 1037      Component Value Date/Time   CALCIUM 9.8 01/13/2012 1037   ALKPHOS 185* 01/13/2012 1037   AST 31 01/13/2012 1037   ALT 24 01/13/2012 1037   BILITOT 0.3 01/13/2012 1037       Studies/Results: Ct Abdomen W Contrast  01/26/2012  *RADIOLOGY REPORT*  Clinical Data:  Restage metastatic colon cancer, chemotherapy in progress  CT ABDOMEN WITH CONTRAST  Technique:  Multidetector CT imaging of the abdomen was performed using the standard protocol following bolus administration of intravenous contrast.  Contrast:  100 ml Omnipaque-300 IV   Comparison:  Multiple prior CTs, most recently 12/05/2011  ---  RECIST protocol 1.1  RECIST target lesions:  1.  Right hepatic lobe and medial segment left hepatic lobe lesion measures 54 mm (series 3/image 15), compared to 52 mm on prior.  2.  Lateral segment left hepatic lobe lesion measures 21 mm (series 3/image 9), compared to 20 mm on prior.  RECIST nontarget lesions:  1. The posterior right hepatic lobe lesion is present, measuring 18 mm (series 3/image 11), compared to 16 mm on prior.  ---  Findings:  Lung bases are clear.  Multiple hepatic metastases, grossly unchanged.  RECIST lesions are used for comparison. --5.4 cm lesion predominantly in the medial segment left hepatic lobe (series 3/image 15), previously 5.2 cm --2.1 cm lesion in the lateral segment left hepatic lobe (series 3/image 9), previously 2.0 cm --1.8 cm lesion in the posterior segment right hepatic lobe (series 3/image 11), previously 1.6 cm  While the target lesions measure just slightly increased from the most recent prior study, this is not considered sufficient to indicate a true change in the context of the overall study.  No new lesions are identified.  Spleen, pancreas, and adrenal glands are within normal limits.  Gallbladder is unremarkable.  Tiny dilated bile ducts in the medial segment left hepatic lobe (series 3/image 17).  No extrahepatic ductal dilatation.  Kidneys are within normal limits.  No hydronephrosis.  Abdominal ascites.  No suspicious abdominal lymphadenopathy.  The visualized bowel, including the appendix, is within  normal limits.  Very mild degenerative changes of the visualized thoracolumbar spine.  IMPRESSION: Multiple hepatic metastases, as described above, without definite evidence of progression.  Original Report Authenticated By: Charline Bills, M.D.    Medications: I have reviewed the patient's current medications.  Assessment/Plan:  1.Metastatic colon cancer with multiple liver metastases noted on an  MRI of the abdomen 12/02/2010 and on a CT 12/13/2010. A transverse colon mass was noted on the CT from 02/11/2011 and confirmed on a colonoscopy 12/17/2010. She began treatment with FOLFIRI/Avastin 12/31/2010. A restaging CT 06/20/2011 confirmed further improvement in the metastatic liver lesions and no evidence for progressive metastatic disease. A restaging CT 08/01/2011 showed stable liver lesions and no new lesions. Restaging CT 09/12/2011 showed stable liver lesions and no new lesions. A restaging CT 10/27/2011 showed stable liver lesions and no new lesions. Restaging CT 12/05/2011 showed mild decrease in size of hepatic metastases and no evidence of disease progression. Restaging CT 01/26/2012 showed stable hepatic metastases and no new lesions. 2. Abdominal pain secondary to hepatomegaly/liver metastases-resolved.  3. Anorexia, early satiety, and weight loss secondary to metastatic colon cancer-resolved.  4. Microcytic anemia.  5. History of migraine.  6. Status post uterine fibroid surgery.  7. Port-A-Cath placement 12/20/2010.  8. Delayed nausea following cycle 1 of FOLFIRI/Avastin. Improved with the addition of Aloxi and prophylactic Decadron beginning with cycle #2. The Decadron is slowly being tapered.  9. Neutropenia secondary to chemotherapy. Cycle #4 was held on 02/11/2011. She has subsequently received Neulasta support.  10. Intermittent fever with no apparent source for infection, likely "tumor fever" or fever related to chemotherapy. She did not have a fever following the most recent cycle of chemotherapy.  11. Mucositis secondary to 5-FU, initially grade 2. Irinotecan and 5-FU were dose reduced per study guidelines.  12. History of abdominal cramping. ? Related to irinotecan, 5-FU, or potentially the colon tumor. No recent abdominal cramping.  13. Thrombocytopenia secondary to chemotherapy. Treatment was held 12/09/2011, 12/30/2011 and 01/06/2012 secondary to thrombocytopenia per study  guidelines.  14. Acne-like rash on the face. Question related to steroids. Improved.  Disposition-Natalie Mccarthy appears stable. Plan to continue treatment with FOLFIRI/Avastin. Dr. Truett Perna recommends proceeding with treatment today as planned despite mild thrombocytopenia. If the platelet count is lower when she is here in 2 weeks Dr. Truett Perna will dose reduce the irinotecan. The study chair has been contacted and given approval regarding the above. Natalie Mccarthy will return in 2 weeks for a followup visit and the next cycle of chemotherapy. She will contact the office in the interim with any problems. ECoG performance status is measured at 0.  Plan reviewed with Dr. Truett Perna.   Lonna Cobb ANP/GNP-BC

## 2012-01-29 ENCOUNTER — Ambulatory Visit (HOSPITAL_BASED_OUTPATIENT_CLINIC_OR_DEPARTMENT_OTHER): Payer: BC Managed Care – PPO

## 2012-01-29 VITALS — BP 135/69 | HR 89 | Temp 96.9°F

## 2012-01-29 DIAGNOSIS — C189 Malignant neoplasm of colon, unspecified: Secondary | ICD-10-CM

## 2012-01-29 MED ORDER — HEPARIN SOD (PORK) LOCK FLUSH 100 UNIT/ML IV SOLN
500.0000 [IU] | Freq: Once | INTRAVENOUS | Status: AC | PRN
  Administered 2012-01-29: 500 [IU]
  Filled 2012-01-29: qty 5

## 2012-01-29 MED ORDER — SODIUM CHLORIDE 0.9 % IJ SOLN
10.0000 mL | INTRAMUSCULAR | Status: DC | PRN
  Administered 2012-01-29: 10 mL
  Filled 2012-01-29: qty 10

## 2012-01-29 MED ORDER — PEGFILGRASTIM INJECTION 6 MG/0.6ML
6.0000 mg | Freq: Once | SUBCUTANEOUS | Status: AC
  Administered 2012-01-29: 6 mg via SUBCUTANEOUS
  Filled 2012-01-29: qty 0.6

## 2012-02-04 ENCOUNTER — Other Ambulatory Visit: Payer: Self-pay | Admitting: *Deleted

## 2012-02-04 DIAGNOSIS — C189 Malignant neoplasm of colon, unspecified: Secondary | ICD-10-CM

## 2012-02-08 ENCOUNTER — Other Ambulatory Visit: Payer: Self-pay | Admitting: Oncology

## 2012-02-10 ENCOUNTER — Other Ambulatory Visit (HOSPITAL_BASED_OUTPATIENT_CLINIC_OR_DEPARTMENT_OTHER): Payer: BC Managed Care – PPO | Admitting: Lab

## 2012-02-10 ENCOUNTER — Ambulatory Visit (HOSPITAL_BASED_OUTPATIENT_CLINIC_OR_DEPARTMENT_OTHER): Payer: BC Managed Care – PPO

## 2012-02-10 ENCOUNTER — Encounter: Payer: Self-pay | Admitting: *Deleted

## 2012-02-10 ENCOUNTER — Ambulatory Visit (HOSPITAL_BASED_OUTPATIENT_CLINIC_OR_DEPARTMENT_OTHER): Payer: BC Managed Care – PPO | Admitting: Oncology

## 2012-02-10 VITALS — BP 123/76 | HR 78

## 2012-02-10 VITALS — BP 116/73 | HR 76 | Temp 97.0°F | Ht 68.0 in | Wt 145.5 lb

## 2012-02-10 DIAGNOSIS — Z5112 Encounter for antineoplastic immunotherapy: Secondary | ICD-10-CM

## 2012-02-10 DIAGNOSIS — C189 Malignant neoplasm of colon, unspecified: Secondary | ICD-10-CM

## 2012-02-10 DIAGNOSIS — D649 Anemia, unspecified: Secondary | ICD-10-CM

## 2012-02-10 DIAGNOSIS — C787 Secondary malignant neoplasm of liver and intrahepatic bile duct: Secondary | ICD-10-CM

## 2012-02-10 DIAGNOSIS — C184 Malignant neoplasm of transverse colon: Secondary | ICD-10-CM

## 2012-02-10 LAB — CBC WITH DIFFERENTIAL/PLATELET
BASO%: 0.9 % (ref 0.0–2.0)
EOS%: 2.7 % (ref 0.0–7.0)
Eosinophils Absolute: 0.2 10*3/uL (ref 0.0–0.5)
LYMPH%: 10.2 % — ABNORMAL LOW (ref 14.0–49.7)
MCH: 25.4 pg (ref 25.1–34.0)
MCHC: 32.4 g/dL (ref 31.5–36.0)
MCV: 78.5 fL — ABNORMAL LOW (ref 79.5–101.0)
MONO%: 6.4 % (ref 0.0–14.0)
Platelets: 102 10*3/uL — ABNORMAL LOW (ref 145–400)
RBC: 4.27 10*6/uL (ref 3.70–5.45)
RDW: 21.9 % — ABNORMAL HIGH (ref 11.2–14.5)
nRBC: 0 % (ref 0–0)

## 2012-02-10 LAB — COMPREHENSIVE METABOLIC PANEL
ALT: 57 U/L — ABNORMAL HIGH (ref 0–35)
Alkaline Phosphatase: 217 U/L — ABNORMAL HIGH (ref 39–117)
CO2: 29 mEq/L (ref 19–32)
Creatinine, Ser: 0.83 mg/dL (ref 0.50–1.10)
Total Bilirubin: 0.3 mg/dL (ref 0.3–1.2)

## 2012-02-10 LAB — URINALYSIS, MICROSCOPIC - CHCC
Blood: NEGATIVE
Glucose: NEGATIVE g/dL
Nitrite: NEGATIVE
Specific Gravity, Urine: 1.03 (ref 1.003–1.035)
pH: 5 (ref 4.6–8.0)

## 2012-02-10 MED ORDER — DEXAMETHASONE SODIUM PHOSPHATE 4 MG/ML IJ SOLN
20.0000 mg | Freq: Once | INTRAMUSCULAR | Status: AC
  Administered 2012-02-10: 20 mg via INTRAVENOUS

## 2012-02-10 MED ORDER — ATROPINE SULFATE 0.4 MG/ML IJ SOLN
0.2500 mg | INTRAMUSCULAR | Status: DC | PRN
  Filled 2012-02-10: qty 0.63

## 2012-02-10 MED ORDER — IRINOTECAN HCL CHEMO INJECTION 100 MG/5ML
150.0000 mg/m2 | Freq: Once | INTRAVENOUS | Status: AC
  Administered 2012-02-10: 258 mg via INTRAVENOUS
  Filled 2012-02-10: qty 12.9

## 2012-02-10 MED ORDER — LEUCOVORIN CALCIUM INJECTION 350 MG
400.0000 mg/m2 | Freq: Once | INTRAVENOUS | Status: AC
  Administered 2012-02-10: 688 mg via INTRAVENOUS
  Filled 2012-02-10: qty 34.4

## 2012-02-10 MED ORDER — SODIUM CHLORIDE 0.9 % IV SOLN: Freq: Once | INTRAVENOUS | Status: DC

## 2012-02-10 MED ORDER — SODIUM CHLORIDE 0.9 % IV SOLN
1920.0000 mg/m2 | INTRAVENOUS | Status: DC
  Administered 2012-02-10: 3300 mg via INTRAVENOUS
  Filled 2012-02-10: qty 66

## 2012-02-10 MED ORDER — FLUOROURACIL CHEMO INJECTION 2.5 GM/50ML
320.0000 mg/m2 | Freq: Once | INTRAVENOUS | Status: AC
  Administered 2012-02-10: 550 mg via INTRAVENOUS
  Filled 2012-02-10: qty 11

## 2012-02-10 MED ORDER — SODIUM CHLORIDE 0.9 % IV SOLN
5.0000 mg/kg | Freq: Once | INTRAVENOUS | Status: AC
  Administered 2012-02-10: 325 mg via INTRAVENOUS
  Filled 2012-02-10: qty 13

## 2012-02-10 MED ORDER — SODIUM CHLORIDE 0.9 % IV SOLN
Freq: Once | INTRAVENOUS | Status: AC
  Administered 2012-02-10: 12:00:00 via INTRAVENOUS

## 2012-02-10 MED ORDER — PALONOSETRON HCL INJECTION 0.25 MG/5ML
0.2500 mg | Freq: Once | INTRAVENOUS | Status: AC
Start: 2012-02-10 — End: 2012-02-10
  Administered 2012-02-10: 0.25 mg via INTRAVENOUS

## 2012-02-10 NOTE — Patient Instructions (Signed)
[facility] Discharge Instructions for Patients Receiving Chemotherapy  Today you received the following chemotherapy agents 5FU, avastin, irinotecan, leucovorin  To help prevent nausea and vomiting after your treatment, we encourage you to take your nausea medication If you develop nausea and vomiting that is not controlled by your nausea medication, call the clinic. If it is after clinic hours your family physician or the after hours number for the clinic or go to the Emergency Department.   BELOW ARE SYMPTOMS THAT SHOULD BE REPORTED IMMEDIATELY:  *FEVER GREATER THAN 100.5 F  *CHILLS WITH OR WITHOUT FEVER  NAUSEA AND VOMITING THAT IS NOT CONTROLLED WITH YOUR NAUSEA MEDICATION  *UNUSUAL SHORTNESS OF BREATH  *UNUSUAL BRUISING OR BLEEDING  TENDERNESS IN MOUTH AND THROAT WITH OR WITHOUT PRESENCE OF ULCERS  *URINARY PROBLEMS  *BOWEL PROBLEMS  UNUSUAL RASH Items with * indicate a potential emergency and should be followed up as soon as possible.   The clinic phone number is [phone].   I have been informed and understand all the instructions given to me. I know to contact the clinic, my physician, or go to the Emergency Department if any problems should occur. I do not have any questions at this time, but understand that I may call the clinic during office hours   should I have any questions or need assistance in obtaining follow up care.    __________________________________________  _____________  __________ Signature of Patient or Authorized Representative            Date                   Time    __________________________________________ Nurse's Signature

## 2012-02-10 NOTE — Progress Notes (Signed)
February 10, 2012, 11:30. [name] cycle 28 Natalie Mccarthy is in the cancer center today for lab work, physical exam, and treatment with FOLFIRI and bevacizumab on the [name] clinical trial. She reports she feels well today. She did experience some nausea without emesis on the day of and after treatment last cycle. She was able to maintain her oral intake. Discussion with Dr. Truett Perna, he will re-escalate her decadron pre-med to 20mg . She did not experience any delayed nausea after the pump was D/C'd.  Review of no-going AE's, she has nosebleeds when she blows her nose, intermittent constipation with cramping when constipated. She has taste changes. No arthralgias at this time. The rash has resolved after the last treatment, approximately 3/28.   Lab results reviewed by Dr. Truett Perna. Per Dr. Truett Perna, abnormal lab results are not clinically significant.

## 2012-02-10 NOTE — Progress Notes (Signed)
OFFICE PROGRESS NOTE   INTERVAL HISTORY:   She completed another cycle of chemotherapy on 01/27/2012. She reports tolerating the chemotherapy well. She has occasional mild abdominal discomfort that resolves spontaneously. No diarrhea or mouth sores. She reports increased nausea following the most recent cycle of chemotherapy.  Objective:  Vital signs in last 24 hours:  Blood pressure 116/73, pulse 76, temperature 97 F (36.1 C), temperature source Oral, height 5\' 8"  (1.727 m), weight 145 lb 8 oz (65.998 kg).    HEENT: No thrush or ulcers Resp: Lungs clear bilaterally Cardio: Regular rate and rhythm GI: No hepatomegaly, nontender, no mass Vascular: No leg edema    Portacath/PICC-without erythema  Lab Results:  Lab Results  Component Value Date   WBC 7.6 02/10/2012   HGB 10.9* 02/10/2012   HCT 33.5* 02/10/2012   MCV 78.5* 02/10/2012   PLT 102* 02/10/2012   ANC 6.1    Medications: I have reviewed the patient's current medications.  Assessment/Plan: 1.Metastatic colon cancer with multiple liver metastases noted on an MRI of the abdomen 12/02/2010 and on a CT 12/13/2010. A transverse colon mass was noted on the CT from 02/11/2011 and confirmed on a colonoscopy 12/17/2010. She began treatment with FOLFIRI/Avastin 12/31/2010. A restaging CT 06/20/2011 confirmed further improvement in the metastatic liver lesions and no evidence for progressive metastatic disease. A restaging CT 08/01/2011 showed stable liver lesions and no new lesions. Restaging CT 09/12/2011 showed stable liver lesions and no new lesions. A restaging CT 10/27/2011 showed stable liver lesions and no new lesions. Restaging CT 12/05/2011 showed mild decrease in size of hepatic metastases and no evidence of disease progression. Restaging CT 01/26/2012 showed stable hepatic metastases and no new lesions.  2. Abdominal pain secondary to hepatomegaly/liver metastases-resolved.  3. Anorexia, early satiety, and weight loss secondary  to metastatic colon cancer-resolved.  4. Microcytic anemia.  5. History of migraine.  6. Status post uterine fibroid surgery.  7. Port-A-Cath placement 12/20/2010.  8. Delayed nausea following cycle 1 of FOLFIRI/Avastin. Improved with the addition of Aloxi and prophylactic Decadron beginning with cycle #2. The the prophylactic Decadron has been tapered. We will increase the day 1 Decadron back to 20 mg. 9. Neutropenia secondary to chemotherapy. Cycle #4 was held on 02/11/2011. She has subsequently received Neulasta support.  10. Intermittent fever with no apparent source for infection, likely "tumor fever" or fever related to chemotherapy. She did not have a fever following the most recent cycle of chemotherapy.  11. Mucositis secondary to 5-FU, initially grade 2. Irinotecan and 5-FU were dose reduced per study guidelines.  12. History of abdominal cramping. ? Related to irinotecan, 5-FU, or potentially the colon tumor. No recent abdominal cramping.  13. Thrombocytopenia secondary to chemotherapy. Treatment was held 12/09/2011, 12/30/2011 and 01/06/2012 secondary to thrombocytopenia per study guidelines.  14. Acne-like rash on the face. Question related to steroids. Improved.    Disposition:  Her overall status appears unchanged. She has an ECoG performance status of 1 today. The plan is to continue FOLFIRI/Avastin per protocol. She will return for an office visit and chemotherapy in 2 weeks.   Lucile Shutters, MD  02/10/2012  11:06 AM

## 2012-02-12 ENCOUNTER — Ambulatory Visit (HOSPITAL_BASED_OUTPATIENT_CLINIC_OR_DEPARTMENT_OTHER): Payer: BC Managed Care – PPO

## 2012-02-12 VITALS — BP 102/69 | HR 78 | Temp 97.0°F

## 2012-02-12 DIAGNOSIS — Z5189 Encounter for other specified aftercare: Secondary | ICD-10-CM

## 2012-02-12 DIAGNOSIS — C189 Malignant neoplasm of colon, unspecified: Secondary | ICD-10-CM

## 2012-02-12 MED ORDER — HEPARIN SOD (PORK) LOCK FLUSH 100 UNIT/ML IV SOLN
500.0000 [IU] | Freq: Once | INTRAVENOUS | Status: AC | PRN
  Administered 2012-02-12: 500 [IU]
  Filled 2012-02-12: qty 5

## 2012-02-12 MED ORDER — SODIUM CHLORIDE 0.9 % IJ SOLN
10.0000 mL | INTRAMUSCULAR | Status: DC | PRN
  Administered 2012-02-12: 10 mL
  Filled 2012-02-12: qty 10

## 2012-02-12 MED ORDER — PEGFILGRASTIM INJECTION 6 MG/0.6ML
6.0000 mg | Freq: Once | SUBCUTANEOUS | Status: AC
  Administered 2012-02-12: 6 mg via SUBCUTANEOUS
  Filled 2012-02-12: qty 0.6

## 2012-02-23 ENCOUNTER — Other Ambulatory Visit: Payer: Self-pay | Admitting: *Deleted

## 2012-02-23 DIAGNOSIS — C189 Malignant neoplasm of colon, unspecified: Secondary | ICD-10-CM

## 2012-02-24 ENCOUNTER — Ambulatory Visit (HOSPITAL_BASED_OUTPATIENT_CLINIC_OR_DEPARTMENT_OTHER): Payer: BC Managed Care – PPO | Admitting: Oncology

## 2012-02-24 ENCOUNTER — Other Ambulatory Visit: Payer: Self-pay | Admitting: *Deleted

## 2012-02-24 ENCOUNTER — Encounter: Payer: Self-pay | Admitting: *Deleted

## 2012-02-24 ENCOUNTER — Telehealth: Payer: Self-pay | Admitting: Oncology

## 2012-02-24 ENCOUNTER — Telehealth: Payer: Self-pay | Admitting: *Deleted

## 2012-02-24 ENCOUNTER — Ambulatory Visit: Payer: BC Managed Care – PPO

## 2012-02-24 ENCOUNTER — Other Ambulatory Visit (HOSPITAL_BASED_OUTPATIENT_CLINIC_OR_DEPARTMENT_OTHER): Payer: BC Managed Care – PPO | Admitting: Lab

## 2012-02-24 VITALS — BP 133/75 | HR 76 | Temp 97.1°F | Ht 68.0 in | Wt 146.2 lb

## 2012-02-24 DIAGNOSIS — C787 Secondary malignant neoplasm of liver and intrahepatic bile duct: Secondary | ICD-10-CM

## 2012-02-24 DIAGNOSIS — C184 Malignant neoplasm of transverse colon: Secondary | ICD-10-CM

## 2012-02-24 DIAGNOSIS — D702 Other drug-induced agranulocytosis: Secondary | ICD-10-CM

## 2012-02-24 DIAGNOSIS — C189 Malignant neoplasm of colon, unspecified: Secondary | ICD-10-CM

## 2012-02-24 DIAGNOSIS — D696 Thrombocytopenia, unspecified: Secondary | ICD-10-CM

## 2012-02-24 LAB — COMPREHENSIVE METABOLIC PANEL
Albumin: 3.6 g/dL (ref 3.5–5.2)
Alkaline Phosphatase: 223 U/L — ABNORMAL HIGH (ref 39–117)
CO2: 27 mEq/L (ref 19–32)
Glucose, Bld: 98 mg/dL (ref 70–99)
Potassium: 4 mEq/L (ref 3.5–5.3)
Sodium: 138 mEq/L (ref 135–145)
Total Protein: 6.7 g/dL (ref 6.0–8.3)

## 2012-02-24 LAB — URINALYSIS, MICROSCOPIC - CHCC
Bilirubin (Urine): NEGATIVE
Glucose: NEGATIVE g/dL
Leukocyte Esterase: NEGATIVE
Nitrite: NEGATIVE
RBC count: NEGATIVE (ref 0–2)
pH: 5 (ref 4.6–8.0)

## 2012-02-24 LAB — CBC WITH DIFFERENTIAL/PLATELET
Basophils Absolute: 0 10*3/uL (ref 0.0–0.1)
Eosinophils Absolute: 0.2 10*3/uL (ref 0.0–0.5)
HGB: 11 g/dL — ABNORMAL LOW (ref 11.6–15.9)
NEUT#: 4.8 10*3/uL (ref 1.5–6.5)
RDW: 22.1 % — ABNORMAL HIGH (ref 11.2–14.5)
WBC: 6 10*3/uL (ref 3.9–10.3)
lymph#: 0.7 10*3/uL — ABNORMAL LOW (ref 0.9–3.3)

## 2012-02-24 NOTE — Telephone Encounter (Signed)
Per staff message from [facility], patient nees treatment appts. I have scheduled the appts and staff message her back.  JMW

## 2012-02-24 NOTE — Telephone Encounter (Signed)
Gv pt appt for april-may2013. sent email to michele to put in tx for 04/30 with a pump on 05/02

## 2012-02-24 NOTE — Progress Notes (Unsigned)
February 24, 2012 10:20 [name], cycle 12 delayed Natalie Mccarthy was in the cancer center today for lab work, physical exam, and to receive cycle 29 of  FOLFIRI/Bevacizumab on the [name] clinical trial.   Her platelet count is 81,000 today so treatment will be delayed and rescheduled for one week.  Review of current AE's:She still experiences a small amount of epistaxis when she blows her nose. She also has taste changes, and intermittent constipation.

## 2012-02-24 NOTE — Telephone Encounter (Signed)
Informed pt to pick new appts for may on 04/30

## 2012-02-24 NOTE — Progress Notes (Signed)
OFFICE PROGRESS NOTE   INTERVAL HISTORY:   She returns as scheduled. Stable mild intermittent nose bleeding. No other bleeding. Occasional abdominal pain. No diarrhea or nausea.  Objective:  Vital signs in last 24 hours:  Blood pressure 133/75, pulse 76, temperature 97.1 F (36.2 C), height 5\' 8"  (1.727 m), weight 146 lb 3.2 oz (66.316 kg).    HEENT: No thrush or ulcers Resp: Lungs clear bilaterally Cardio: Regular rate and rhythm GI: No hepatomegaly, nontender Vascular: No leg edema   Portacath/PICC-without erythema  Lab Results:  Lab Results  Component Value Date   WBC 6.0 02/24/2012   HGB 11.0* 02/24/2012   HCT 34.4* 02/24/2012   MCV 79.1* 02/24/2012   PLT 82* 02/24/2012      Medications: I have reviewed the patient's current medications.  Assessment/Plan: 1.Metastatic colon cancer with multiple liver metastases noted on an MRI of the abdomen 12/02/2010 and on a CT 12/13/2010. A transverse colon mass was noted on the CT from 02/11/2011 and confirmed on a colonoscopy 12/17/2010. She began treatment with FOLFIRI/Avastin 12/31/2010. A restaging CT 06/20/2011 confirmed further improvement in the metastatic liver lesions and no evidence for progressive metastatic disease. A restaging CT 08/01/2011 showed stable liver lesions and no new lesions. Restaging CT 09/12/2011 showed stable liver lesions and no new lesions. A restaging CT 10/27/2011 showed stable liver lesions and no new lesions. Restaging CT 12/05/2011 showed mild decrease in size of hepatic metastases and no evidence of disease progression. Restaging CT 01/26/2012 showed stable hepatic metastases and no new lesions.  2. Abdominal pain secondary to hepatomegaly/liver metastases-resolved.  3. Anorexia, early satiety, and weight loss secondary to metastatic colon cancer-resolved.  4. Microcytic anemia.  5. History of migraine.  6. Status post uterine fibroid surgery.  7. Port-A-Cath placement 12/20/2010.  8. Delayed  nausea following cycle 1 of FOLFIRI/Avastin. Improved with the addition of Aloxi and prophylactic Decadron beginning with cycle #2. The the prophylactic Decadron has been tapered. We will increase the day 1 Decadron back to 20 mg.  9. Neutropenia secondary to chemotherapy. Cycle #4 was held on 02/11/2011. She has subsequently received Neulasta support.  10. Intermittent fever with no apparent source for infection, likely "tumor fever" or fever related to chemotherapy. She did not have a fever following the most recent cycle of chemotherapy.  11. Mucositis secondary to 5-FU, initially grade 2. Irinotecan and 5-FU were dose reduced per study guidelines.  12. History of abdominal cramping. ? Related to irinotecan, 5-FU, or potentially the colon tumor. No recent abdominal cramping.  13. Thrombocytopenia secondary to chemotherapy. Treatment was held 12/09/2011, 12/30/2011 and 01/06/2012 secondary to thrombocytopenia per study guidelines. Treatment will be held today per study guidelines. 14. Acne-like rash on the face. Question related to steroids. Improved.   Disposition:  We decided to hold treatment today due to thrombocytopenia. The thrombocytopenia is most likely related to irinotecan, but the Avastin can also cause thrombocytopenia. She will return for an office visit and chemotherapy in one week. Restaging CT scans will be scheduled for after the next cycle of chemotherapy. Her ECoG performance status is measured at 0-1 today.   Thornton Papas, MD  02/24/2012  9:20 AM

## 2012-02-27 ENCOUNTER — Other Ambulatory Visit: Payer: Self-pay | Admitting: *Deleted

## 2012-03-01 ENCOUNTER — Other Ambulatory Visit: Payer: Self-pay | Admitting: Oncology

## 2012-03-02 ENCOUNTER — Other Ambulatory Visit (HOSPITAL_BASED_OUTPATIENT_CLINIC_OR_DEPARTMENT_OTHER): Payer: BC Managed Care – PPO | Admitting: Lab

## 2012-03-02 ENCOUNTER — Ambulatory Visit (HOSPITAL_BASED_OUTPATIENT_CLINIC_OR_DEPARTMENT_OTHER): Payer: BC Managed Care – PPO | Admitting: Oncology

## 2012-03-02 ENCOUNTER — Ambulatory Visit (HOSPITAL_BASED_OUTPATIENT_CLINIC_OR_DEPARTMENT_OTHER): Payer: BC Managed Care – PPO

## 2012-03-02 ENCOUNTER — Telehealth: Payer: Self-pay | Admitting: Oncology

## 2012-03-02 ENCOUNTER — Encounter: Payer: Self-pay | Admitting: *Deleted

## 2012-03-02 VITALS — BP 126/75 | HR 80 | Temp 97.1°F | Ht 68.0 in | Wt 147.6 lb

## 2012-03-02 DIAGNOSIS — C189 Malignant neoplasm of colon, unspecified: Secondary | ICD-10-CM

## 2012-03-02 DIAGNOSIS — Z5112 Encounter for antineoplastic immunotherapy: Secondary | ICD-10-CM

## 2012-03-02 DIAGNOSIS — D649 Anemia, unspecified: Secondary | ICD-10-CM

## 2012-03-02 DIAGNOSIS — C184 Malignant neoplasm of transverse colon: Secondary | ICD-10-CM

## 2012-03-02 DIAGNOSIS — Z5111 Encounter for antineoplastic chemotherapy: Secondary | ICD-10-CM

## 2012-03-02 DIAGNOSIS — C787 Secondary malignant neoplasm of liver and intrahepatic bile duct: Secondary | ICD-10-CM

## 2012-03-02 DIAGNOSIS — D702 Other drug-induced agranulocytosis: Secondary | ICD-10-CM

## 2012-03-02 LAB — CBC WITH DIFFERENTIAL/PLATELET
Basophils Absolute: 0.1 10*3/uL (ref 0.0–0.1)
EOS%: 3.2 % (ref 0.0–7.0)
HGB: 11.4 g/dL — ABNORMAL LOW (ref 11.6–15.9)
LYMPH%: 9.6 % — ABNORMAL LOW (ref 14.0–49.7)
MCH: 26 pg (ref 25.1–34.0)
MCV: 81.5 fL (ref 79.5–101.0)
MONO%: 9.8 % (ref 0.0–14.0)
NEUT%: 76.4 % (ref 38.4–76.8)
Platelets: 94 10*3/uL — ABNORMAL LOW (ref 145–400)
RDW: 24.9 % — ABNORMAL HIGH (ref 11.2–14.5)

## 2012-03-02 LAB — URINALYSIS, MICROSCOPIC - CHCC
Bilirubin (Urine): NEGATIVE
Glucose: NEGATIVE g/dL
Ketones: NEGATIVE mg/dL

## 2012-03-02 LAB — COMPREHENSIVE METABOLIC PANEL
Alkaline Phosphatase: 227 U/L — ABNORMAL HIGH (ref 39–117)
BUN: 16 mg/dL (ref 6–23)
CO2: 28 mEq/L (ref 19–32)
Creatinine, Ser: 0.85 mg/dL (ref 0.50–1.10)
Glucose, Bld: 101 mg/dL — ABNORMAL HIGH (ref 70–99)
Total Bilirubin: 0.3 mg/dL (ref 0.3–1.2)

## 2012-03-02 MED ORDER — LEUCOVORIN CALCIUM INJECTION 350 MG
400.0000 mg/m2 | Freq: Once | INTRAVENOUS | Status: AC
  Administered 2012-03-02: 688 mg via INTRAVENOUS
  Filled 2012-03-02: qty 34.4

## 2012-03-02 MED ORDER — HEPARIN SOD (PORK) LOCK FLUSH 100 UNIT/ML IV SOLN
500.0000 [IU] | Freq: Once | INTRAVENOUS | Status: DC | PRN
  Filled 2012-03-02: qty 5

## 2012-03-02 MED ORDER — SODIUM CHLORIDE 0.9 % IV SOLN
Freq: Once | INTRAVENOUS | Status: AC
  Administered 2012-03-02: 10:00:00 via INTRAVENOUS

## 2012-03-02 MED ORDER — SODIUM CHLORIDE 0.9 % IJ SOLN
10.0000 mL | INTRAMUSCULAR | Status: DC | PRN
  Filled 2012-03-02: qty 10

## 2012-03-02 MED ORDER — SODIUM CHLORIDE 0.9 % IV SOLN
5.0000 mg/kg | Freq: Once | INTRAVENOUS | Status: AC
  Administered 2012-03-02: 325 mg via INTRAVENOUS
  Filled 2012-03-02: qty 13

## 2012-03-02 MED ORDER — SODIUM CHLORIDE 0.9 % IV SOLN
1920.0000 mg/m2 | INTRAVENOUS | Status: DC
  Administered 2012-03-02: 3300 mg via INTRAVENOUS
  Filled 2012-03-02: qty 66

## 2012-03-02 MED ORDER — FLUOROURACIL CHEMO INJECTION 2.5 GM/50ML
320.0000 mg/m2 | Freq: Once | INTRAVENOUS | Status: AC
  Administered 2012-03-02: 550 mg via INTRAVENOUS
  Filled 2012-03-02: qty 11

## 2012-03-02 MED ORDER — SODIUM CHLORIDE 0.9 % IV SOLN
Freq: Once | INTRAVENOUS | Status: AC
  Administered 2012-03-02: 12:00:00 via INTRAVENOUS

## 2012-03-02 NOTE — Progress Notes (Signed)
March 02, 2012, 10:00   [name], cycle 29 Patient in the cancer center this morning for lab work, physical exam, and treatment on the [name] clinical trial.  Her treatment has been delayed one week D/T thrombocytopenia. Today she reports feeling well. Review of continuing AEs show she continues with slight nose bleed when she blows her nose and a small amount when she brushes her teeth. She continues to have taste changes bur says it has improved, intermittent constipation bur has not required a laxative recently. She still has Grade 1 thrombocytopenia.  Review of lab results with Dr. Truett Perna this AM show continuing grade 1 thrombocytopenia. As described in section 4.3.2, Bevacizumab will be given minus the drug causing the specific toxicity for this cycle. Dr. Truett Perna will hold the irinotecan.  He finds the abnormal lab results from the CMET to be clinically insignificant.

## 2012-03-02 NOTE — Telephone Encounter (Signed)
Gave pt appt calendar for 5/14, lab , md and chemo , dc pump on 5/16th, cancelled appt for 5/7 and 5/9 per MD

## 2012-03-02 NOTE — Progress Notes (Signed)
OFFICE PROGRESS NOTE   INTERVAL HISTORY:   She returns as scheduled. She reports minor nose bleeding and gum bleeding with brushing her teeth. No new complaint the  Objective:  Vital signs in last 24 hours:  Blood pressure 126/75, pulse 80, temperature 97.1 F (36.2 C), temperature source Oral, height 5\' 8"  (1.727 m), weight 147 lb 9.6 oz (66.951 kg).    HEENT: No thrush or ulcers. Resp: Lungs clear bilaterally Cardio: Regular rate and rhythm GI: Nontender, no hepatomegaly Vascular: No leg edema   Portacath/PICC-without erythema  Lab Results:  Lab Results  Component Value Date   WBC 5.7 03/02/2012   HGB 11.4* 03/02/2012   HCT 35.7 03/02/2012   MCV 81.5 03/02/2012   PLT 94* 03/02/2012   ANC 4.4    Medications: I have reviewed the patient's current medications.  Assessment/Plan: 1.Metastatic colon cancer with multiple liver metastases noted on an MRI of the abdomen 12/02/2010 and on a CT 12/13/2010. A transverse colon mass was noted on the CT from 02/11/2011 and confirmed on a colonoscopy 12/17/2010. She began treatment with FOLFIRI/Avastin 12/31/2010. A restaging CT 06/20/2011 confirmed further improvement in the metastatic liver lesions and no evidence for progressive metastatic disease. A restaging CT 08/01/2011 showed stable liver lesions and no new lesions. Restaging CT 09/12/2011 showed stable liver lesions and no new lesions. A restaging CT 10/27/2011 showed stable liver lesions and no new lesions. Restaging CT 12/05/2011 showed mild decrease in size of hepatic metastases and no evidence of disease progression. Restaging CT 01/26/2012 showed stable hepatic metastases and no new lesions.  2. Abdominal pain secondary to hepatomegaly/liver metastases-resolved.  3. Anorexia, early satiety, and weight loss secondary to metastatic colon cancer-resolved.  4. Microcytic anemia.  5. History of migraine.  6. Status post uterine fibroid surgery.  7. Port-A-Cath placement  12/20/2010.  8. Delayed nausea following cycle 1 of FOLFIRI/Avastin. Improved with the addition of Aloxi and prophylactic Decadron beginning with cycle #2. The the prophylactic Decadron has been tapered. We will increase the day 1 Decadron back to 20 mg.  9. Neutropenia secondary to chemotherapy. Cycle #4 was held on 02/11/2011. She has subsequently received Neulasta support.  10. Intermittent fever with no apparent source for infection, likely "tumor fever" or fever related to chemotherapy. She did not have a fever following the most recent cycle of chemotherapy.  11. Mucositis secondary to 5-FU, initially grade 2. Irinotecan and 5-FU were dose reduced per study guidelines.  12. History of abdominal cramping. ? Related to irinotecan, 5-FU, or potentially the colon tumor. No recent abdominal cramping.  13. Thrombocytopenia secondary to chemotherapy. Treatment was held 12/09/2011, 12/30/2011 and 01/06/2012 secondary to thrombocytopenia per study guidelines.  14. Acne-like rash on the face. Question related to steroids. Resolved.   Disposition:   She appears stable. The platelet count is better, but remains low. We decided to proceed with 5-fluorouracil/Avastin today. The irinotecan, anti-emetics, and Neulasta will be held with this cycle. She will return for an office visit and chemotherapy in 2 weeks. The thrombocytopenia is most likely related to irinotecan, though the Avastin could also be contributing. She is scheduled for a restaging CT evaluation prior to the next visit.   Thornton Papas, MD  03/02/2012  8:44 AM

## 2012-03-04 ENCOUNTER — Ambulatory Visit (HOSPITAL_BASED_OUTPATIENT_CLINIC_OR_DEPARTMENT_OTHER): Payer: BC Managed Care – PPO

## 2012-03-04 VITALS — BP 126/81 | HR 77 | Temp 97.9°F

## 2012-03-04 DIAGNOSIS — C787 Secondary malignant neoplasm of liver and intrahepatic bile duct: Secondary | ICD-10-CM

## 2012-03-04 DIAGNOSIS — C189 Malignant neoplasm of colon, unspecified: Secondary | ICD-10-CM

## 2012-03-04 DIAGNOSIS — C184 Malignant neoplasm of transverse colon: Secondary | ICD-10-CM

## 2012-03-04 MED ORDER — HEPARIN SOD (PORK) LOCK FLUSH 100 UNIT/ML IV SOLN
500.0000 [IU] | Freq: Once | INTRAVENOUS | Status: AC | PRN
  Administered 2012-03-04: 500 [IU]
  Filled 2012-03-04: qty 5

## 2012-03-04 MED ORDER — SODIUM CHLORIDE 0.9 % IJ SOLN
10.0000 mL | INTRAMUSCULAR | Status: DC | PRN
Start: 2012-03-04 — End: 2012-03-04
  Administered 2012-03-04: 10 mL
  Filled 2012-03-04: qty 10

## 2012-03-04 NOTE — Patient Instructions (Signed)
Call MD for Problems 

## 2012-03-05 ENCOUNTER — Other Ambulatory Visit (HOSPITAL_COMMUNITY): Payer: BC Managed Care – PPO

## 2012-03-09 ENCOUNTER — Ambulatory Visit: Payer: BC Managed Care – PPO | Admitting: Oncology

## 2012-03-09 ENCOUNTER — Other Ambulatory Visit: Payer: BC Managed Care – PPO | Admitting: Lab

## 2012-03-09 ENCOUNTER — Ambulatory Visit: Payer: BC Managed Care – PPO

## 2012-03-10 ENCOUNTER — Ambulatory Visit (HOSPITAL_COMMUNITY)
Admission: RE | Admit: 2012-03-10 | Discharge: 2012-03-10 | Disposition: A | Discharge status: Home / Self Care | Payer: BC Managed Care – PPO | Source: Ambulatory Visit | Attending: Oncology | Admitting: Oncology

## 2012-03-10 ENCOUNTER — Encounter (HOSPITAL_COMMUNITY): Payer: Self-pay

## 2012-03-10 DIAGNOSIS — C787 Secondary malignant neoplasm of liver and intrahepatic bile duct: Secondary | ICD-10-CM | POA: Insufficient documentation

## 2012-03-10 DIAGNOSIS — C189 Malignant neoplasm of colon, unspecified: Secondary | ICD-10-CM | POA: Insufficient documentation

## 2012-03-10 MED ORDER — IOHEXOL 300 MG/ML  SOLN
10.0000 mL | Freq: Once | INTRAMUSCULAR | Status: AC | PRN
  Administered 2012-03-10: 10 mL via INTRAVENOUS

## 2012-03-12 ENCOUNTER — Other Ambulatory Visit: Payer: Self-pay | Admitting: *Deleted

## 2012-03-12 DIAGNOSIS — C189 Malignant neoplasm of colon, unspecified: Secondary | ICD-10-CM

## 2012-03-14 ENCOUNTER — Other Ambulatory Visit: Payer: Self-pay | Admitting: Oncology

## 2012-03-15 ENCOUNTER — Other Ambulatory Visit: Payer: Self-pay | Admitting: Oncology

## 2012-03-16 ENCOUNTER — Telehealth: Payer: Self-pay | Admitting: *Deleted

## 2012-03-16 ENCOUNTER — Other Ambulatory Visit (HOSPITAL_BASED_OUTPATIENT_CLINIC_OR_DEPARTMENT_OTHER): Payer: BC Managed Care – PPO | Admitting: Lab

## 2012-03-16 ENCOUNTER — Ambulatory Visit (HOSPITAL_BASED_OUTPATIENT_CLINIC_OR_DEPARTMENT_OTHER): Payer: BC Managed Care – PPO | Admitting: Oncology

## 2012-03-16 ENCOUNTER — Ambulatory Visit: Payer: BC Managed Care – PPO

## 2012-03-16 ENCOUNTER — Encounter: Payer: Self-pay | Admitting: *Deleted

## 2012-03-16 VITALS — BP 113/72 | HR 87 | Temp 98.2°F | Ht 68.0 in | Wt 147.2 lb

## 2012-03-16 DIAGNOSIS — D702 Other drug-induced agranulocytosis: Secondary | ICD-10-CM

## 2012-03-16 DIAGNOSIS — D649 Anemia, unspecified: Secondary | ICD-10-CM

## 2012-03-16 DIAGNOSIS — C189 Malignant neoplasm of colon, unspecified: Secondary | ICD-10-CM

## 2012-03-16 DIAGNOSIS — C787 Secondary malignant neoplasm of liver and intrahepatic bile duct: Secondary | ICD-10-CM

## 2012-03-16 DIAGNOSIS — C184 Malignant neoplasm of transverse colon: Secondary | ICD-10-CM

## 2012-03-16 LAB — CBC WITH DIFFERENTIAL/PLATELET
BASO%: 0.9 % (ref 0.0–2.0)
HCT: 37.6 % (ref 34.8–46.6)
LYMPH%: 17.1 % (ref 14.0–49.7)
MCH: 26 pg (ref 25.1–34.0)
MCHC: 31.4 g/dL — ABNORMAL LOW (ref 31.5–36.0)
MCV: 83 fL (ref 79.5–101.0)
MONO#: 0.4 10*3/uL (ref 0.1–0.9)
MONO%: 11.3 % (ref 0.0–14.0)
NEUT%: 64 % (ref 38.4–76.8)
Platelets: 66 10*3/uL — ABNORMAL LOW (ref 145–400)
RBC: 4.53 10*6/uL (ref 3.70–5.45)
WBC: 3.3 10*3/uL — ABNORMAL LOW (ref 3.9–10.3)
nRBC: 0 % (ref 0–0)

## 2012-03-16 LAB — URINALYSIS, MICROSCOPIC - CHCC
Blood: NEGATIVE
Glucose: NEGATIVE g/dL
Ketones: NEGATIVE mg/dL
Protein: NEGATIVE mg/dL
Specific Gravity, Urine: 1.03 (ref 1.003–1.035)

## 2012-03-16 LAB — COMPREHENSIVE METABOLIC PANEL
ALT: 42 U/L — ABNORMAL HIGH (ref 0–35)
AST: 48 U/L — ABNORMAL HIGH (ref 0–37)
BUN: 16 mg/dL (ref 6–23)
Calcium: 9.4 mg/dL (ref 8.4–10.5)
Chloride: 101 mEq/L (ref 96–112)
Creatinine, Ser: 0.86 mg/dL (ref 0.50–1.10)
Total Bilirubin: 0.4 mg/dL (ref 0.3–1.2)

## 2012-03-16 NOTE — Progress Notes (Signed)
03/16/2012, 1300 [name], Cycle 30, delayed Natalie Mccarthy was in the cancer center today for lab work, physical exam and to receive treatment on the [name] clinical trial. Treatment is delayed today D/T thrombocytopenia, Grade 2.  She will be scheduled to return in one week.  Per Dr. Truett Perna, abnormal lab results are not clinically significant except for the thrombocytopenia which has caused treatment delay.

## 2012-03-16 NOTE — Progress Notes (Signed)
[facility]    OFFICE PROGRESS NOTE   INTERVAL HISTORY:   She completed another cycle of chemotherapy on 03/02/2012. She reports mild nausea for several days following chemotherapy. The nausea was relieved with Zofran.  Her chief complaint is discomfort at the right shoulder, right hip, and right knee. No leg numbness or weakness. No back pain. Mild vaginal "spotting ".  Objective:  Vital signs in last 24 hours:  Blood pressure 113/72, pulse 87, temperature 98.2 F (36.8 C), temperature source Oral, height 5\' 8"  (1.727 m), weight 147 lb 3.2 oz (66.769 kg).    HEENT: No thrush or ulcers Resp: Lungs clear bilaterally Cardio: Regular rate and rhythm GI: No hepatomegaly, nontender, no mass Vascular: No leg edema Musculoskeletal: Mild crepitance at the right knee. Full range of motion at the right shoulder and hip without pain.     Portacath/PICC-without erythema  Lab Results:  Lab Results  Component Value Date   WBC 3.3* 03/16/2012   HGB 11.8 03/16/2012   HCT 37.6 03/16/2012   MCV 83.0 03/16/2012   PLT 66* 03/16/2012   ANC 2.1  X-rays: Restaging CT of the abdomen on 03/10/2012-decreased size of hepatic metastases, no new metastases.    Medications: I have reviewed the patient's current medications.  Assessment/Plan: 1.Metastatic colon cancer with multiple liver metastases noted on an MRI of the abdomen 12/02/2010 and on a CT 12/13/2010. A transverse colon mass was noted on the CT from 02/11/2011 and confirmed on a colonoscopy 12/17/2010. She began treatment with FOLFIRI/Avastin 12/31/2010. A restaging CT 06/20/2011 confirmed further improvement in the metastatic liver lesions and no evidence for progressive metastatic disease. A restaging CT 08/01/2011 showed stable liver lesions and no new lesions. Restaging CT 09/12/2011 showed stable liver lesions and no new lesions. A restaging CT 10/27/2011 showed stable liver lesions and no new lesions. Restaging CT  12/05/2011 showed mild decrease in size of hepatic metastases and no evidence of disease progression. Restaging CT 01/26/2012 showed stable hepatic metastases and no new lesions. Restaging CT may 5/8/ 2013 revealed a decreased size of hepatic metastases and no new lesions. 2. Abdominal pain secondary to hepatomegaly/liver metastases-resolved.  3. Anorexia, early satiety, and weight loss secondary to metastatic colon cancer-resolved.  4. Microcytic anemia.  5. History of migraine.  6. Status post uterine fibroid surgery.  7. Port-A-Cath placement 12/20/2010.  8. Delayed nausea following cycle 1 of FOLFIRI/Avastin. Improved with the addition of Aloxi and prophylactic Decadron beginning with cycle #2. The the prophylactic Decadron has been tapered. We increased the day 1 Decadron back to 20 mg.  9. Neutropenia secondary to chemotherapy. Cycle #4 was held on 02/11/2011. She has subsequently received Neulasta support.  10. Intermittent fever with no apparent source for infection, likely "tumor fever" or fever related to chemotherapy. She did not have a fever following the most recent cycle of chemotherapy.  11. Mucositis secondary to 5-FU, initially grade 2. Irinotecan and 5-FU were dose reduced per study guidelines.  12. History of abdominal cramping. ? Related to irinotecan, 5-FU, or potentially the colon tumor. No recent abdominal cramping.  13. Thrombocytopenia secondary to chemotherapy. Treatment was held 12/09/2011, 12/30/2011 and 01/06/2012 secondary to thrombocytopenia per study guidelines. Irinotecan was held on 03/02/2012. The thrombocytopenia may be related to Avastin. 14. Acne-like rash on the face. Question related to steroids. Resolved.  15. Arthralgias-most likely unrelated to the colon cancer diagnosis and chemotherapy. She will schedule an appointment with her orthopedic physician if the arthralgias persist.  Disposition:  The restaging CT shows an improvement in the liver metastases.  Chemotherapy will be held per the study guidelines today. We will contact the study chair regarding plans for further chemotherapy and the possibility of Avastin related thrombocytopenia.  Ms. Duplessis will return for an office visit and chemotherapy in one week.   Thornton Papas, MD  03/16/2012  11:34 AM

## 2012-03-16 NOTE — Telephone Encounter (Signed)
Per staff message from Paul, I have schedule the patient for treatment next week after MD visit.  Anne aware appt in  Computer,.  JMW

## 2012-03-22 ENCOUNTER — Other Ambulatory Visit: Payer: Self-pay | Admitting: Oncology

## 2012-03-23 ENCOUNTER — Encounter: Payer: BC Managed Care – PPO | Admitting: Oncology

## 2012-03-23 ENCOUNTER — Other Ambulatory Visit (HOSPITAL_BASED_OUTPATIENT_CLINIC_OR_DEPARTMENT_OTHER): Payer: BC Managed Care – PPO | Admitting: Lab

## 2012-03-23 ENCOUNTER — Other Ambulatory Visit: Payer: Self-pay | Admitting: *Deleted

## 2012-03-23 ENCOUNTER — Ambulatory Visit: Payer: BC Managed Care – PPO

## 2012-03-23 ENCOUNTER — Encounter: Payer: Self-pay | Admitting: *Deleted

## 2012-03-23 DIAGNOSIS — C189 Malignant neoplasm of colon, unspecified: Secondary | ICD-10-CM

## 2012-03-23 LAB — CBC WITH DIFFERENTIAL/PLATELET
BASO%: 1.1 % (ref 0.0–2.0)
EOS%: 7.2 % — ABNORMAL HIGH (ref 0.0–7.0)
HCT: 38 % (ref 34.8–46.6)
LYMPH%: 15.9 % (ref 14.0–49.7)
MCH: 27 pg (ref 25.1–34.0)
MCHC: 32.2 g/dL (ref 31.5–36.0)
NEUT%: 63.7 % (ref 38.4–76.8)
Platelets: 72 10*3/uL — ABNORMAL LOW (ref 145–400)
RBC: 4.53 10*6/uL (ref 3.70–5.45)
lymph#: 0.5 10*3/uL — ABNORMAL LOW (ref 0.9–3.3)

## 2012-03-23 LAB — URINALYSIS, MICROSCOPIC - CHCC
Blood: NEGATIVE
Ketones: NEGATIVE mg/dL
Protein: NEGATIVE mg/dL
Specific Gravity, Urine: 1.03 (ref 1.003–1.035)
pH: 5 (ref 4.6–8.0)

## 2012-03-23 LAB — COMPREHENSIVE METABOLIC PANEL
AST: 44 U/L — ABNORMAL HIGH (ref 0–37)
Alkaline Phosphatase: 224 U/L — ABNORMAL HIGH (ref 39–117)
BUN: 16 mg/dL (ref 6–23)
Creatinine, Ser: 0.77 mg/dL (ref 0.50–1.10)

## 2012-03-23 NOTE — Progress Notes (Signed)
03/23/12, 9:45 [name] Cycle 30, delayed Mrs. Koval was in the cancer center today for lab work, physical exam, and treatment with FOLFIRI and Bevacizumzb. CBC shows platelets still at a grade 2. Treatment held and exam deferred by Dr. Truett Perna. Plan made to repeat labs next week and plan to treat.  [id number], 9:50. Phone call from  Margot Ables, MD Medical Director, Oncology [facility]   He has received the request for medical review and concurs with holding the avastin this week. He recommends repeating the labs next week and talking with Dr. Truett Perna to develop a plan for treatment so th patient is not delayed again.

## 2012-03-23 NOTE — Progress Notes (Signed)
This encounter was created in error - please disregard.

## 2012-03-28 ENCOUNTER — Other Ambulatory Visit: Payer: Self-pay | Admitting: Oncology

## 2012-03-30 ENCOUNTER — Other Ambulatory Visit (HOSPITAL_BASED_OUTPATIENT_CLINIC_OR_DEPARTMENT_OTHER): Payer: BC Managed Care – PPO | Admitting: Lab

## 2012-03-30 ENCOUNTER — Telehealth: Payer: Self-pay | Admitting: Oncology

## 2012-03-30 ENCOUNTER — Encounter: Payer: Self-pay | Admitting: *Deleted

## 2012-03-30 ENCOUNTER — Ambulatory Visit (HOSPITAL_BASED_OUTPATIENT_CLINIC_OR_DEPARTMENT_OTHER): Payer: BC Managed Care – PPO | Admitting: Oncology

## 2012-03-30 ENCOUNTER — Ambulatory Visit (HOSPITAL_BASED_OUTPATIENT_CLINIC_OR_DEPARTMENT_OTHER): Payer: BC Managed Care – PPO

## 2012-03-30 VITALS — BP 131/79 | HR 73 | Temp 97.0°F | Ht 68.0 in | Wt 147.5 lb

## 2012-03-30 DIAGNOSIS — C189 Malignant neoplasm of colon, unspecified: Secondary | ICD-10-CM

## 2012-03-30 DIAGNOSIS — C184 Malignant neoplasm of transverse colon: Secondary | ICD-10-CM

## 2012-03-30 DIAGNOSIS — Z5111 Encounter for antineoplastic chemotherapy: Secondary | ICD-10-CM

## 2012-03-30 DIAGNOSIS — D6959 Other secondary thrombocytopenia: Secondary | ICD-10-CM

## 2012-03-30 DIAGNOSIS — C787 Secondary malignant neoplasm of liver and intrahepatic bile duct: Secondary | ICD-10-CM

## 2012-03-30 LAB — COMPREHENSIVE METABOLIC PANEL
ALT: 43 U/L — ABNORMAL HIGH (ref 0–35)
Albumin: 3.6 g/dL (ref 3.5–5.2)
CO2: 29 mEq/L (ref 19–32)
Calcium: 9.8 mg/dL (ref 8.4–10.5)
Chloride: 103 mEq/L (ref 96–112)
Creatinine, Ser: 0.77 mg/dL (ref 0.50–1.10)
Total Protein: 6.6 g/dL (ref 6.0–8.3)

## 2012-03-30 LAB — CBC WITH DIFFERENTIAL/PLATELET
Eosinophils Absolute: 0.2 10*3/uL (ref 0.0–0.5)
HCT: 39.6 % (ref 34.8–46.6)
LYMPH%: 14.3 % (ref 14.0–49.7)
MONO#: 0.4 10*3/uL (ref 0.1–0.9)
NEUT#: 2.3 10*3/uL (ref 1.5–6.5)
Platelets: 72 10*3/uL — ABNORMAL LOW (ref 145–400)
RBC: 4.7 10*6/uL (ref 3.70–5.45)
WBC: 3.4 10*3/uL — ABNORMAL LOW (ref 3.9–10.3)
nRBC: 0 % (ref 0–0)

## 2012-03-30 LAB — URINALYSIS, MICROSCOPIC - CHCC
Blood: NEGATIVE
Glucose: NEGATIVE g/dL
Ketones: NEGATIVE mg/dL
Protein: NEGATIVE mg/dL
Specific Gravity, Urine: 1.03 (ref 1.003–1.035)

## 2012-03-30 MED ORDER — HEPARIN SOD (PORK) LOCK FLUSH 100 UNIT/ML IV SOLN
500.0000 [IU] | Freq: Once | INTRAVENOUS | Status: DC | PRN
  Filled 2012-03-30: qty 5

## 2012-03-30 MED ORDER — FLUOROURACIL CHEMO INJECTION 5 GM/100ML
1920.0000 mg/m2 | INTRAVENOUS | Status: DC
  Administered 2012-03-30: 3300 mg via INTRAVENOUS
  Filled 2012-03-30: qty 66

## 2012-03-30 MED ORDER — SODIUM CHLORIDE 0.9 % IV SOLN
Freq: Once | INTRAVENOUS | Status: AC
  Administered 2012-03-30: 15:00:00 via INTRAVENOUS

## 2012-03-30 MED ORDER — SODIUM CHLORIDE 0.9 % IJ SOLN
10.0000 mL | INTRAMUSCULAR | Status: DC | PRN
  Filled 2012-03-30: qty 10

## 2012-03-30 MED ORDER — DEXTROSE 5 % IV SOLN
400.0000 mg/m2 | Freq: Once | INTRAVENOUS | Status: AC
  Administered 2012-03-30: 688 mg via INTRAVENOUS
  Filled 2012-03-30: qty 34.4

## 2012-03-30 MED ORDER — SODIUM CHLORIDE 0.9 % IV SOLN
Freq: Once | INTRAVENOUS | Status: AC
  Administered 2012-03-30: 12:00:00 via INTRAVENOUS

## 2012-03-30 MED ORDER — FLUOROURACIL CHEMO INJECTION 2.5 GM/50ML
320.0000 mg/m2 | Freq: Once | INTRAVENOUS | Status: AC
  Administered 2012-03-30: 550 mg via INTRAVENOUS
  Filled 2012-03-30: qty 11

## 2012-03-30 NOTE — Telephone Encounter (Signed)
Gv pt appt for [email or web address]. sent email to michele to schedule chemo for 6-11

## 2012-03-30 NOTE — Progress Notes (Signed)
[facility]    OFFICE PROGRESS NOTE   INTERVAL HISTORY:   She returns as scheduled. She continues to have arthralgias in the arms and hips. The arthralgias improved with activity. No joint swelling. No bleeding. No abdominal pain.  Objective:  Vital signs in last 24 hours:  Blood pressure 131/79, pulse 73, temperature 97 F (36.1 C), temperature source Oral, height 5\' 8"  (1.727 m), weight 147 lb 8 oz (66.906 kg).    HEENT: No thrush or ulcers Resp: Lungs clear bilaterally Cardio: Regular rate and rhythm GI: No hepatomegaly, nontender Vascular: No leg edema  Musculoskeletal: Joint without erythema or swelling   Portacath/PICC-without erythema  Lab Results:  Lab Results  Component Value Date   WBC 3.4* 03/30/2012   HGB 12.9 03/30/2012   HCT 39.6 03/30/2012   MCV 84.3 03/30/2012   PLT 72* 03/30/2012   ANC 2.3   Medications: I have reviewed the patient's current medications.  Assessment/Plan: 1.Metastatic colon cancer with multiple liver metastases noted on an MRI of the abdomen 12/02/2010 and on a CT 12/13/2010. A transverse colon mass was noted on the CT from 02/11/2011 and confirmed on a colonoscopy 12/17/2010. She began treatment with FOLFIRI/Avastin 12/31/2010. A restaging CT 06/20/2011 confirmed further improvement in the metastatic liver lesions and no evidence for progressive metastatic disease. A restaging CT 08/01/2011 showed stable liver lesions and no new lesions. Restaging CT 09/12/2011 showed stable liver lesions and no new lesions. A restaging CT 10/27/2011 showed stable liver lesions and no new lesions. Restaging CT 12/05/2011 showed mild decrease in size of hepatic metastases and no evidence of disease progression. Restaging CT 01/26/2012 showed stable hepatic metastases and no new lesions. Restaging CT may 5/8/ 2013 revealed a decreased size of hepatic metastases and no new lesions.  2. Abdominal pain secondary to hepatomegaly/liver  metastases-resolved.  3. Anorexia, early satiety, and weight loss secondary to metastatic colon cancer-resolved.  4. Microcytic anemia.  5. History of migraine.  6. Status post uterine fibroid surgery.  7. Port-A-Cath placement 12/20/2010.  8. Delayed nausea following cycle 1 of FOLFIRI/Avastin. Improved with the addition of Aloxi and prophylactic Decadron beginning with cycle #2. The the prophylactic Decadron has been tapered. We increased the day 1 Decadron back to 20 mg.  9. Neutropenia secondary to chemotherapy. Cycle #4 was held on 02/11/2011. She has subsequently received Neulasta support.  10. Intermittent fever with no apparent source for infection, likely "tumor fever" or fever related to chemotherapy. She did not have a fever following the most recent cycle of chemotherapy.  11. Mucositis secondary to 5-FU, initially grade 2. Irinotecan and 5-FU were dose reduced per study guidelines.  12. History of abdominal cramping. ? Related to irinotecan, 5-FU, or potentially the colon tumor. No recent abdominal cramping.  13. Thrombocytopenia secondary to chemotherapy. Treatment has been held for thrombocytopenia per study guidelines. I suspect the thrombocytopenia is related to Avastin.  14. Acne-like rash on the face. Question related to steroids. Resolved.  15. Arthralgias-most likely unrelated to the colon cancer diagnosis and chemotherapy. She is scheduled for orthopedics appointment this week.    Disposition:  Her overall status is stable. The thrombocytopenia persists despite being off of systemic therapy for the past 4 weeks. We have attempted to contact the protocol chair today. We have been unable to reach the protocol chair.  I decided to proceed with 5 fluorouracil/leucovorin today. We will make the Avastin and irinotecan from the chemotherapy. She will  return for an office visit  and chemotherapy in 2 weeks.  Her ECoG performance status is measured at 0-1 today.   Thornton Papas, MD  03/30/2012  11:35 AM

## 2012-03-30 NOTE — Patient Instructions (Signed)
[facility] Discharge Instructions for Patients Receiving Chemotherapy  Today you received the following chemotherapy agents 5 FU/Leucovorin To help prevent nausea and vomiting after your treatment, we encourage you to take your nausea medication as prescribed.   If you develop nausea and vomiting that is not controlled by your nausea medication, call the clinic. If it is after clinic hours your family physician or the after hours number for the clinic or go to the Emergency Department.   BELOW ARE SYMPTOMS THAT SHOULD BE REPORTED IMMEDIATELY:  *FEVER GREATER THAN 100.5 F  *CHILLS WITH OR WITHOUT FEVER  NAUSEA AND VOMITING THAT IS NOT CONTROLLED WITH YOUR NAUSEA MEDICATION  *UNUSUAL SHORTNESS OF BREATH  *UNUSUAL BRUISING OR BLEEDING  TENDERNESS IN MOUTH AND THROAT WITH OR WITHOUT PRESENCE OF ULCERS  *URINARY PROBLEMS  *BOWEL PROBLEMS  UNUSUAL RASH Items with * indicate a potential emergency and should be followed up as soon as possible.  One of the nurses will contact you 24 hours after your treatment. Please let the nurse know about any problems that you may have experienced. Feel free to call the clinic you have any questions or concerns. The clinic phone number is [phone].   I have been informed and understand all the instructions given to me. I know to contact the clinic, my physician, or go to the Emergency Department if any problems should occur. I do not have any questions at this time, but understand that I may call the clinic during office hours   should I have any questions or need assistance in obtaining follow up care.    __________________________________________  _____________  __________ Signature of Patient or Authorized Representative            Date                   Time    __________________________________________ Nurse's Signature    

## 2012-03-30 NOTE — Progress Notes (Signed)
Mar 30, 2012: 13:25 [name], cycle 30 Natalie Mccarthy was in the cancer center today for lab work physical exam, and cycle 30 on the [name] clinical trial. Her platelet count remains low at 72,000. Other lab results were WNL for treatment. CMET reviewed by Dr. Truett Perna. Abnormal lab results are not clinically significant except for thrombocytopenia.   Natalie Mccarthy reports she has experienced aching in her joints beginning sometime after Easter. She does not have nose bleeds or constipation now. Taste change is present but has improved. Slight fatigue continues.   Call placed to Medical monitor for the study as planned last week with no answer. Message left on his voicemail requesting a return call. Call placed to [phone] as directed on MM voicemail. No answer. Discussion with Dr. Truett Perna, he will treat Natalie Mccarthy today with 5FU and leucovorin.

## 2012-03-30 NOTE — Progress Notes (Signed)
Ok to treat with 5 Fu bolus/5 Fu pump and Leucovorin per [facility] in [place] and Dr. Truett Perna.

## 2012-04-01 ENCOUNTER — Ambulatory Visit (HOSPITAL_BASED_OUTPATIENT_CLINIC_OR_DEPARTMENT_OTHER): Payer: BC Managed Care – PPO

## 2012-04-01 VITALS — BP 127/77 | HR 78 | Temp 97.7°F

## 2012-04-01 DIAGNOSIS — C189 Malignant neoplasm of colon, unspecified: Secondary | ICD-10-CM

## 2012-04-01 DIAGNOSIS — C787 Secondary malignant neoplasm of liver and intrahepatic bile duct: Secondary | ICD-10-CM

## 2012-04-01 DIAGNOSIS — C184 Malignant neoplasm of transverse colon: Secondary | ICD-10-CM

## 2012-04-01 MED ORDER — HEPARIN SOD (PORK) LOCK FLUSH 100 UNIT/ML IV SOLN
500.0000 [IU] | Freq: Once | INTRAVENOUS | Status: AC | PRN
  Administered 2012-04-01: 500 [IU]
  Filled 2012-04-01: qty 5

## 2012-04-01 MED ORDER — SODIUM CHLORIDE 0.9 % IJ SOLN
10.0000 mL | INTRAMUSCULAR | Status: DC | PRN
  Administered 2012-04-01: 10 mL
  Filled 2012-04-01: qty 10

## 2012-04-01 NOTE — Patient Instructions (Signed)
Call MD for problems 

## 2012-04-11 ENCOUNTER — Other Ambulatory Visit: Payer: Self-pay | Admitting: Oncology

## 2012-04-12 ENCOUNTER — Other Ambulatory Visit: Payer: Self-pay | Admitting: *Deleted

## 2012-04-12 DIAGNOSIS — C189 Malignant neoplasm of colon, unspecified: Secondary | ICD-10-CM

## 2012-04-13 ENCOUNTER — Ambulatory Visit (HOSPITAL_BASED_OUTPATIENT_CLINIC_OR_DEPARTMENT_OTHER): Payer: BC Managed Care – PPO

## 2012-04-13 ENCOUNTER — Other Ambulatory Visit (HOSPITAL_BASED_OUTPATIENT_CLINIC_OR_DEPARTMENT_OTHER): Payer: BC Managed Care – PPO | Admitting: Lab

## 2012-04-13 ENCOUNTER — Encounter: Payer: Self-pay | Admitting: *Deleted

## 2012-04-13 ENCOUNTER — Telehealth: Payer: Self-pay | Admitting: Oncology

## 2012-04-13 ENCOUNTER — Other Ambulatory Visit: Payer: Self-pay | Admitting: Oncology

## 2012-04-13 ENCOUNTER — Ambulatory Visit (HOSPITAL_BASED_OUTPATIENT_CLINIC_OR_DEPARTMENT_OTHER): Payer: BC Managed Care – PPO | Admitting: Nurse Practitioner

## 2012-04-13 VITALS — BP 139/80 | HR 78 | Temp 97.0°F | Ht 68.0 in | Wt 149.6 lb

## 2012-04-13 DIAGNOSIS — C184 Malignant neoplasm of transverse colon: Secondary | ICD-10-CM

## 2012-04-13 DIAGNOSIS — C787 Secondary malignant neoplasm of liver and intrahepatic bile duct: Secondary | ICD-10-CM

## 2012-04-13 DIAGNOSIS — C189 Malignant neoplasm of colon, unspecified: Secondary | ICD-10-CM

## 2012-04-13 DIAGNOSIS — D696 Thrombocytopenia, unspecified: Secondary | ICD-10-CM

## 2012-04-13 DIAGNOSIS — Z5111 Encounter for antineoplastic chemotherapy: Secondary | ICD-10-CM

## 2012-04-13 LAB — URINALYSIS, MICROSCOPIC - CHCC
Leukocyte Esterase: NEGATIVE
Nitrite: NEGATIVE
Protein: NEGATIVE mg/dL
RBC count: NEGATIVE (ref 0–2)
WBC, UA: NEGATIVE (ref 0–2)
pH: 6 (ref 4.6–8.0)

## 2012-04-13 LAB — COMPREHENSIVE METABOLIC PANEL
AST: 48 U/L — ABNORMAL HIGH (ref 0–37)
Albumin: 3.8 g/dL (ref 3.5–5.2)
Alkaline Phosphatase: 191 U/L — ABNORMAL HIGH (ref 39–117)
BUN: 17 mg/dL (ref 6–23)
Calcium: 9.5 mg/dL (ref 8.4–10.5)
Chloride: 102 mEq/L (ref 96–112)
Potassium: 3.7 mEq/L (ref 3.5–5.3)
Sodium: 139 mEq/L (ref 135–145)
Total Protein: 6.8 g/dL (ref 6.0–8.3)

## 2012-04-13 LAB — CBC WITH DIFFERENTIAL/PLATELET
Basophils Absolute: 0 10*3/uL (ref 0.0–0.1)
EOS%: 3.8 % (ref 0.0–7.0)
Eosinophils Absolute: 0.1 10*3/uL (ref 0.0–0.5)
HGB: 13.5 g/dL (ref 11.6–15.9)
MCH: 27.5 pg (ref 25.1–34.0)
MCV: 83.2 fL (ref 79.5–101.0)
MONO%: 13.4 % (ref 0.0–14.0)
NEUT#: 2.3 10*3/uL (ref 1.5–6.5)
RBC: 4.9 10*6/uL (ref 3.70–5.45)
RDW: 21.9 % — ABNORMAL HIGH (ref 11.2–14.5)
lymph#: 0.5 10*3/uL — ABNORMAL LOW (ref 0.9–3.3)

## 2012-04-13 MED ORDER — SODIUM CHLORIDE 0.9 % IV SOLN
Freq: Once | INTRAVENOUS | Status: AC
  Administered 2012-04-13: 100 mL via INTRAVENOUS

## 2012-04-13 MED ORDER — LEUCOVORIN CALCIUM INJECTION 350 MG
400.0000 mg/m2 | Freq: Once | INTRAVENOUS | Status: AC
  Administered 2012-04-13: 688 mg via INTRAVENOUS
  Filled 2012-04-13: qty 34.4

## 2012-04-13 MED ORDER — SODIUM CHLORIDE 0.9 % IV SOLN
1600.0000 mg/m2 | INTRAVENOUS | Status: DC
  Administered 2012-04-13: 2750 mg via INTRAVENOUS
  Filled 2012-04-13: qty 55

## 2012-04-13 MED ORDER — FLUOROURACIL CHEMO INJECTION 500 MG/10ML
270.0000 mg/m2 | Freq: Once | INTRAVENOUS | Status: AC
  Administered 2012-04-13: 450 mg via INTRAVENOUS
  Filled 2012-04-13: qty 9

## 2012-04-13 MED ORDER — SODIUM CHLORIDE 0.9 % IV SOLN
Freq: Once | INTRAVENOUS | Status: DC
Start: 2012-04-13 — End: 2012-04-13

## 2012-04-13 NOTE — Patient Instructions (Signed)
[facility] Discharge Instructions for Patients Receiving Chemotherapy  Today you received the following chemotherapy agents Leucovorin and Adrucil.  To help prevent nausea and vomiting after your treatment, we encourage you to take your nausea medication. Begin taking it as often as prescribed for Dr. Truett Perna.    If you develop nausea and vomiting that is not controlled by your nausea medication, call the clinic. If it is after clinic hours your family physician or the after hours number for the clinic or go to the Emergency Department.   BELOW ARE SYMPTOMS THAT SHOULD BE REPORTED IMMEDIATELY:  *FEVER GREATER THAN 100.5 F  *CHILLS WITH OR WITHOUT FEVER  NAUSEA AND VOMITING THAT IS NOT CONTROLLED WITH YOUR NAUSEA MEDICATION  *UNUSUAL SHORTNESS OF BREATH  *UNUSUAL BRUISING OR BLEEDING  TENDERNESS IN MOUTH AND THROAT WITH OR WITHOUT PRESENCE OF ULCERS  *URINARY PROBLEMS  *BOWEL PROBLEMS  UNUSUAL RASH Items with * indicate a potential emergency and should be followed up as soon as possible.  One of the nurses will contact you 24 hours after your treatment. Please let the nurse know about any problems that you may have experienced. Feel free to call the clinic you have any questions or concerns. The clinic phone number is [phone].   I have been informed and understand all the instructions given to me. I know to contact the clinic, my physician, or go to the Emergency Department if any problems should occur. I do not have any questions at this time, but understand that I may call the clinic during office hours   should I have any questions or need assistance in obtaining follow up care.    __________________________________________  _____________  __________ Signature of Patient or Authorized Representative            Date                   Time    __________________________________________ Nurse's Signature

## 2012-04-13 NOTE — Progress Notes (Signed)
June 11,2013, [name], cycle 31 Patient in the cancer center today for lab work, physical exam and cycle 31 on the [name] clinical trial. She reports she is doing well since her last visit but the arthralgias continue. She has seen another doctor and had a steroid taper with some relief. Her ECOG PS is 1 D/T the soreness.    Her platelet count remains low at 72,000 today. Dr. Truett Perna spoke with Dr. Santina Evans, medical monitor for the study by phone while the patient was here today.  The decision was made to treat with 5FU and leucovorin with the 5FU reduced to dose level 2.   Per Dr Truett Perna, abnormal lab results are not clinically significant except for thrombocytopenia.

## 2012-04-13 NOTE — Progress Notes (Signed)
OFFICE PROGRESS NOTE  Interval history:  Natalie Mccarthy returns as scheduled. She overall feels well. She was last treated with 5 fluorouracil/leucovorin on 03/30/2012. Avastin and irinotecan were held on 03/30/2012 due to persistent thrombocytopenia. She denies nausea/vomiting. No mouth sores. No diarrhea. No abdominal pain. She denies shortness of breath. No cough. No bleeding. She denies fever.  Natalie Mccarthy was evaluated by Dr. Cleophas Dunker, orthopedics on 04/02/2012. The etiology of the arthralgias was unclear. She was started on a prednisone taper. Natalie Mccarthy noted initial improvement in the arthralgias. The arthralgias did not completely resolved on prednisone. She completed the prednisone taper on 04/10/2012. She notes some increase in the pain since completion of the taper.   Objective: Blood pressure 139/80, pulse 78, temperature 97 F (36.1 C), temperature source Oral, height 5\' 8"  (1.727 m), weight 149 lb 9.6 oz (67.858 kg).  Oropharynx is without thrush or ulceration. Lungs are clear. Regular cardiac rhythm. Port-A-Cath site is without erythema. Abdomen is soft and nontender. No hepatomegaly. Extremities are without edema. Calves are soft and nontender.  Lab Results: Lab Results  Component Value Date   WBC 3.4* 04/13/2012   HGB 13.5 04/13/2012   HCT 40.8 04/13/2012   MCV 83.2 04/13/2012   PLT 72* 04/13/2012    Chemistry:    Chemistry      Component Value Date/Time   NA 141 03/30/2012 1000   NA 143 01/27/2012 1354   K 3.9 03/30/2012 1000   K 3.8 01/27/2012 1354   CL 103 03/30/2012 1000   CL 97* 01/27/2012 1354   CO2 29 03/30/2012 1000   CO2 29 01/27/2012 1354   BUN 16 03/30/2012 1000   BUN 11 01/27/2012 1354   CREATININE 0.77 03/30/2012 1000   CREATININE 0.9 01/27/2012 1354      Component Value Date/Time   CALCIUM 9.8 03/30/2012 1000   CALCIUM 8.9 01/27/2012 1354   ALKPHOS 269* 03/30/2012 1000   ALKPHOS 198* 01/27/2012 1354   AST 41* 03/30/2012 1000   AST 34 01/27/2012 1354   ALT 43*  03/30/2012 1000   BILITOT 0.4 03/30/2012 1000   BILITOT 0.50 01/27/2012 1354       Studies/Results: No results found.  Medications: I have reviewed the patient's current medications.  Assessment/Plan:  1.Metastatic colon cancer with multiple liver metastases noted on an MRI of the abdomen 12/02/2010 and on a CT 12/13/2010. A transverse colon mass was noted on the CT from 02/11/2011 and confirmed on a colonoscopy 12/17/2010. She began treatment with FOLFIRI/Avastin 12/31/2010. A restaging CT 06/20/2011 confirmed further improvement in the metastatic liver lesions and no evidence for progressive metastatic disease. A restaging CT 08/01/2011 showed stable liver lesions and no new lesions. Restaging CT 09/12/2011 showed stable liver lesions and no new lesions. A restaging CT 10/27/2011 showed stable liver lesions and no new lesions. Restaging CT 12/05/2011 showed mild decrease in size of hepatic metastases and no evidence of disease progression. Restaging CT 01/26/2012 showed stable hepatic metastases and no new lesions. Restaging CT may 5/8/ 2013 revealed a decreased size of hepatic metastases and no new lesions.  2. Abdominal pain secondary to hepatomegaly/liver metastases-resolved.  3. Anorexia, early satiety, and weight loss secondary to metastatic colon cancer-resolved.  4. Microcytic anemia.  5. History of migraine.  6. Status post uterine fibroid surgery.  7. Port-A-Cath placement 12/20/2010.  8. Delayed nausea following cycle 1 of FOLFIRI/Avastin. Improved with the addition of Aloxi and prophylactic Decadron beginning with cycle #2. The prophylactic Decadron has been tapered. We  increased the day 1 Decadron back to 20 mg.  9. Neutropenia secondary to chemotherapy. Cycle #4 was held on 02/11/2011. She has subsequently received Neulasta support.  10. Intermittent fever with no apparent source for infection, likely "tumor fever" or fever related to chemotherapy. She did not have a fever  following the most recent cycle of chemotherapy.  11. Mucositis secondary to 5-FU, initially grade 2. Irinotecan and 5-FU were dose reduced per study guidelines.  12. History of abdominal cramping. ? Related to irinotecan, 5-FU, or potentially the colon tumor. No recent abdominal cramping.  13. Thrombocytopenia secondary to chemotherapy. Treatment has been held for thrombocytopenia per study guidelines. The  thrombocytopenia may be related to Avastin.  14. Acne-like rash on the face. Question related to steroids. Resolved.  15. Arthralgias-most likely unrelated to the colon cancer diagnosis and chemotherapy. She has been evaluated by orthopedics. The etiology of the arthralgias is unclear. She recently completed a prednisone taper.  Disposition-Natalie Mccarthy appears stable. She has persistent thrombocytopenia. Plan to proceed with 5 fluorouracil/leucovorin today as scheduled. We will continue to hold the Avastin and irinotecan. Dr. Truett Perna spoke with the protocol chair. The 5-fluorouracil will be dose reduced. Natalie Mccarthy will return for a followup visit and the next cycle of chemotherapy in 2 weeks. She will contact the office in the interim with any problems. ECoG performance status is measured at 0-1.  Plan per Dr. Truett Perna.   Natalie Mccarthy ANP/GNP-BC

## 2012-04-13 NOTE — Telephone Encounter (Signed)
Appts made and printed for pt  aom 

## 2012-04-15 ENCOUNTER — Ambulatory Visit (HOSPITAL_BASED_OUTPATIENT_CLINIC_OR_DEPARTMENT_OTHER): Payer: BC Managed Care – PPO

## 2012-04-15 ENCOUNTER — Ambulatory Visit: Payer: BC Managed Care – PPO | Admitting: Nurse Practitioner

## 2012-04-15 ENCOUNTER — Other Ambulatory Visit: Payer: BC Managed Care – PPO | Admitting: Lab

## 2012-04-15 VITALS — BP 132/74 | HR 76 | Temp 98.0°F

## 2012-04-15 DIAGNOSIS — C189 Malignant neoplasm of colon, unspecified: Secondary | ICD-10-CM

## 2012-04-15 DIAGNOSIS — C184 Malignant neoplasm of transverse colon: Secondary | ICD-10-CM

## 2012-04-15 DIAGNOSIS — C787 Secondary malignant neoplasm of liver and intrahepatic bile duct: Secondary | ICD-10-CM

## 2012-04-15 MED ORDER — SODIUM CHLORIDE 0.9 % IJ SOLN
10.0000 mL | INTRAMUSCULAR | Status: DC | PRN
  Administered 2012-04-15: 10 mL
  Filled 2012-04-15: qty 10

## 2012-04-15 MED ORDER — HEPARIN SOD (PORK) LOCK FLUSH 100 UNIT/ML IV SOLN
500.0000 [IU] | Freq: Once | INTRAVENOUS | Status: AC | PRN
  Administered 2012-04-15: 500 [IU]
  Filled 2012-04-15: qty 5

## 2012-04-20 ENCOUNTER — Other Ambulatory Visit: Payer: Self-pay | Admitting: *Deleted

## 2012-04-20 ENCOUNTER — Telehealth: Payer: Self-pay | Admitting: *Deleted

## 2012-04-20 DIAGNOSIS — C787 Secondary malignant neoplasm of liver and intrahepatic bile duct: Secondary | ICD-10-CM

## 2012-04-20 MED ORDER — PANTOPRAZOLE SODIUM 40 MG PO TBEC: 40.0000 mg | DELAYED_RELEASE_TABLET | Freq: Every day | ORAL | Status: DC

## 2012-04-20 NOTE — Telephone Encounter (Signed)
No further note needed.

## 2012-04-23 ENCOUNTER — Ambulatory Visit (HOSPITAL_COMMUNITY)
Admission: RE | Admit: 2012-04-23 | Discharge: 2012-04-23 | Disposition: A | Discharge status: Home / Self Care | Payer: BC Managed Care – PPO | Source: Ambulatory Visit | Attending: Nurse Practitioner | Admitting: Nurse Practitioner

## 2012-04-23 DIAGNOSIS — R161 Splenomegaly, not elsewhere classified: Secondary | ICD-10-CM | POA: Insufficient documentation

## 2012-04-23 DIAGNOSIS — K7689 Other specified diseases of liver: Secondary | ICD-10-CM | POA: Insufficient documentation

## 2012-04-23 DIAGNOSIS — C189 Malignant neoplasm of colon, unspecified: Secondary | ICD-10-CM | POA: Insufficient documentation

## 2012-04-23 MED ORDER — IOHEXOL 300 MG/ML  SOLN
100.0000 mL | Freq: Once | INTRAMUSCULAR | Status: AC | PRN
  Administered 2012-04-23: 100 mL via INTRAVENOUS

## 2012-04-26 ENCOUNTER — Encounter: Payer: Self-pay | Admitting: *Deleted

## 2012-04-26 DIAGNOSIS — C189 Malignant neoplasm of colon, unspecified: Secondary | ICD-10-CM

## 2012-04-27 ENCOUNTER — Telehealth: Payer: Self-pay | Admitting: *Deleted

## 2012-04-27 ENCOUNTER — Ambulatory Visit (HOSPITAL_BASED_OUTPATIENT_CLINIC_OR_DEPARTMENT_OTHER): Payer: BC Managed Care – PPO | Admitting: Oncology

## 2012-04-27 ENCOUNTER — Encounter: Payer: Self-pay | Admitting: *Deleted

## 2012-04-27 ENCOUNTER — Telehealth: Payer: Self-pay | Admitting: Oncology

## 2012-04-27 ENCOUNTER — Ambulatory Visit: Payer: BC Managed Care – PPO

## 2012-04-27 ENCOUNTER — Other Ambulatory Visit (HOSPITAL_BASED_OUTPATIENT_CLINIC_OR_DEPARTMENT_OTHER): Payer: BC Managed Care – PPO | Admitting: Lab

## 2012-04-27 VITALS — BP 124/78 | HR 79 | Temp 96.9°F | Ht 68.0 in | Wt 150.0 lb

## 2012-04-27 DIAGNOSIS — C184 Malignant neoplasm of transverse colon: Secondary | ICD-10-CM

## 2012-04-27 DIAGNOSIS — C787 Secondary malignant neoplasm of liver and intrahepatic bile duct: Secondary | ICD-10-CM

## 2012-04-27 DIAGNOSIS — D696 Thrombocytopenia, unspecified: Secondary | ICD-10-CM

## 2012-04-27 DIAGNOSIS — C189 Malignant neoplasm of colon, unspecified: Secondary | ICD-10-CM

## 2012-04-27 DIAGNOSIS — D702 Other drug-induced agranulocytosis: Secondary | ICD-10-CM

## 2012-04-27 LAB — URINALYSIS, MICROSCOPIC - CHCC
Bilirubin (Urine): NEGATIVE
Blood: NEGATIVE
Glucose: NEGATIVE g/dL
Specific Gravity, Urine: 1.03 (ref 1.003–1.035)

## 2012-04-27 LAB — CBC WITH DIFFERENTIAL/PLATELET
BASO%: 0.7 % (ref 0.0–2.0)
Eosinophils Absolute: 0.1 10*3/uL (ref 0.0–0.5)
HCT: 39 % (ref 34.8–46.6)
LYMPH%: 15.7 % (ref 14.0–49.7)
MCHC: 32.7 g/dL (ref 31.5–36.0)
MCV: 85.3 fL (ref 79.5–101.0)
MONO#: 0.3 10*3/uL (ref 0.1–0.9)
NEUT%: 68.8 % (ref 38.4–76.8)
Platelets: 66 10*3/uL — ABNORMAL LOW (ref 145–400)
WBC: 2.5 10*3/uL — ABNORMAL LOW (ref 3.9–10.3)

## 2012-04-27 LAB — COMPREHENSIVE METABOLIC PANEL
CO2: 28 mEq/L (ref 19–32)
Creatinine, Ser: 0.79 mg/dL (ref 0.50–1.10)
Glucose, Bld: 93 mg/dL (ref 70–99)
Total Bilirubin: 0.5 mg/dL (ref 0.3–1.2)

## 2012-04-27 NOTE — Progress Notes (Signed)
[facility]    OFFICE PROGRESS NOTE   INTERVAL HISTORY:   She returns as scheduled. She completed a cycle of 5-fluorouracil/leucovorin on 04/13/2012. No abdominal pain. No bleeding aside from mild bruising. The arthralgias have returned since discontinuing prednisone. The arthralgias are chiefly on the "right side "of the body. She continues to work. No new complaint.  Objective:  Vital signs in last 24 hours:  Blood pressure 124/78, pulse 79, temperature 96.9 F (36.1 C), temperature source Oral, height 5\' 8"  (1.727 m), weight 150 lb (68.04 kg).    HEENT: No thrush or ulcers Resp: Lungs clear bilaterally Cardio: Regular rate and rhythm GI: The abdomen is nontender, no hepatomegaly, no mass Vascular: No leg edema     Portacath/PICC-without erythema  Lab Results:  Lab Results  Component Value Date   WBC 2.5* 04/27/2012   HGB 12.8 04/27/2012   HCT 39.0 04/27/2012   MCV 85.3 04/27/2012   PLT 66* 04/27/2012  ANC 1.7    X-rays: CT abdomen on 04/23/2012-compared to 03/10/2012-measurable lesions in the central hepatic region i and left hepatic lobe are unchanged. Nontarget lesions in the right hepatic lobe in segment 8 of the liver have increased in size. Other smaller hepatic lesions are similar in size, mild splenomegaly Medications: I have reviewed the patient's current medications.  Assessment/Plan: 1.Metastatic colon cancer with multiple liver metastases noted on an MRI of the abdomen 12/02/2010 and on a CT 12/13/2010. A transverse colon mass was noted on the CT from 02/11/2011 and confirmed on a colonoscopy 12/17/2010. She began treatment with FOLFIRI/Avastin 12/31/2010. A restaging CT 06/20/2011 confirmed further improvement in the metastatic liver lesions and no evidence for progressive metastatic disease. A restaging CT 08/01/2011 showed stable liver lesions and no new lesions. Restaging CT 09/12/2011 showed stable liver lesions and no new lesions. A  restaging CT 10/27/2011 showed stable liver lesions and no new lesions. Restaging CT 12/05/2011 showed mild decrease in size of hepatic metastases and no evidence of disease progression. Restaging CT 01/26/2012 showed stable hepatic metastases and no new lesions. Restaging CT may 5/8/ 2013 revealed a decreased size of hepatic metastases and no new lesions. Restaging CT 04/23/2012 revealed no change in the measurable "target" lesions, a slight increase in the size of other lesions, and no new lesions. 2. Abdominal pain secondary to hepatomegaly/liver metastases-resolved.  3. Anorexia, early satiety, and weight loss secondary to metastatic colon cancer-resolved.  4. Microcytic anemia. Resolved. 5. History of migraine.  6. Status post uterine fibroid surgery.  7. Port-A-Cath placement 12/20/2010.  8. Delayed nausea following cycle 1 of FOLFIRI/Avastin. Improved with the addition of Aloxi and prophylactic Decadron beginning with cycle #2. The prophylactic Decadron has been tapered. We increased the day 1 Decadron back to 20 mg.  9. Neutropenia secondary to chemotherapy. Cycle #4 was held on 02/11/2011. She has subsequently received Neulasta support.  10. Intermittent fever with no apparent source for infection, likely "tumor fever" or fever related to chemotherapy. She did not have a fever following the most recent cycle of chemotherapy.  11. Mucositis secondary to 5-FU, initially grade 2. Irinotecan and 5-FU were dose reduced per study guidelines.  12. History of abdominal cramping. ? Related to irinotecan, 5-FU, or potentially the colon tumor. No recent abdominal cramping.  13. Thrombocytopenia secondary to chemotherapy. Treatment has been held for thrombocytopenia per study guidelines. The thrombocytopenia may be related to Avastin, or chronic liver disease from chemotherapy. The CT on 04/23/2012 confirmed mild splenomegaly 14. Acne-like rash  on the face. Question related to steroids. Resolved.  15.  Arthralgias-most likely unrelated to the colon cancer diagnosis and chemotherapy. She has been evaluated by orthopedics. The etiology of the arthralgias is unclear. She recently completed a prednisone taper and is now taking Celebrex   Disposition:  She appears stable. The restaging CT reveals stable disease. There is persistent thrombocytopenia. The platelet count has not recovered despite being off of Avastin and irinotecan. I discussed the case with the protocol chair today and we decided to discontinue all systemic therapy and follow the platelet count. She will return for an office visit and schedule chemotherapy in 2 weeks.   Thornton Papas, MD  04/27/2012  8:08 PM

## 2012-04-27 NOTE — Telephone Encounter (Signed)
Per staff message I have scheduled appts. JMW  

## 2012-04-27 NOTE — Telephone Encounter (Signed)
Gv pt appt for 07/09-07/11. Informed pt that michele will add chemo appt fior 07/09. emailed michele to add chemo for [phone]

## 2012-04-27 NOTE — Progress Notes (Signed)
April 27, 2012, 11:15 [name] cycle 32 delayed Patient in the cancer center today for lab work, physical exam, and treatment on the [name] clinical trial.  Her platelet count is still low at 66,000. Dr. Truett Perna and the [name] medical monitor talked and the decision was made to delay treatment 2 weeks.   Discussion with Mrs. Corprew about cuttent adverse events. She still has taste changes, no epistaxis, no constipation. Her arthralgia is relieved when she takes prednisone but returns when she has completed a course.  She has begun taking celebrex for joint pain.   Labs reviewed with Dr. Truett Perna. He finds the abnormal results to be not clinically significant.

## 2012-04-28 ENCOUNTER — Other Ambulatory Visit: Payer: BC Managed Care – PPO | Admitting: Lab

## 2012-04-28 ENCOUNTER — Ambulatory Visit: Payer: BC Managed Care – PPO | Admitting: Oncology

## 2012-05-10 ENCOUNTER — Other Ambulatory Visit: Payer: Self-pay | Admitting: Oncology

## 2012-05-11 ENCOUNTER — Ambulatory Visit (HOSPITAL_BASED_OUTPATIENT_CLINIC_OR_DEPARTMENT_OTHER): Payer: BC Managed Care – PPO | Admitting: Oncology

## 2012-05-11 ENCOUNTER — Other Ambulatory Visit: Payer: Self-pay | Admitting: *Deleted

## 2012-05-11 ENCOUNTER — Ambulatory Visit (HOSPITAL_BASED_OUTPATIENT_CLINIC_OR_DEPARTMENT_OTHER): Payer: BC Managed Care – PPO

## 2012-05-11 ENCOUNTER — Other Ambulatory Visit (HOSPITAL_BASED_OUTPATIENT_CLINIC_OR_DEPARTMENT_OTHER): Payer: BC Managed Care – PPO | Admitting: Lab

## 2012-05-11 ENCOUNTER — Telehealth: Payer: Self-pay | Admitting: Oncology

## 2012-05-11 ENCOUNTER — Encounter: Payer: Self-pay | Admitting: *Deleted

## 2012-05-11 DIAGNOSIS — Z5111 Encounter for antineoplastic chemotherapy: Secondary | ICD-10-CM

## 2012-05-11 DIAGNOSIS — C189 Malignant neoplasm of colon, unspecified: Secondary | ICD-10-CM

## 2012-05-11 DIAGNOSIS — C787 Secondary malignant neoplasm of liver and intrahepatic bile duct: Secondary | ICD-10-CM

## 2012-05-11 DIAGNOSIS — C184 Malignant neoplasm of transverse colon: Secondary | ICD-10-CM

## 2012-05-11 DIAGNOSIS — D6959 Other secondary thrombocytopenia: Secondary | ICD-10-CM

## 2012-05-11 DIAGNOSIS — D702 Other drug-induced agranulocytosis: Secondary | ICD-10-CM

## 2012-05-11 LAB — CBC WITH DIFFERENTIAL/PLATELET
BASO%: 0.7 % (ref 0.0–2.0)
Eosinophils Absolute: 0.1 10*3/uL (ref 0.0–0.5)
MCHC: 33.5 g/dL (ref 31.5–36.0)
MONO#: 0.6 10*3/uL (ref 0.1–0.9)
NEUT#: 3.4 10*3/uL (ref 1.5–6.5)
Platelets: 81 10*3/uL — ABNORMAL LOW (ref 145–400)
RBC: 4.95 10*6/uL (ref 3.70–5.45)
RDW: 19.9 % — ABNORMAL HIGH (ref 11.2–14.5)
WBC: 4.7 10*3/uL (ref 3.9–10.3)
lymph#: 0.6 10*3/uL — ABNORMAL LOW (ref 0.9–3.3)

## 2012-05-11 LAB — COMPREHENSIVE METABOLIC PANEL
AST: 29 U/L (ref 0–37)
Albumin: 3.8 g/dL (ref 3.5–5.2)
Alkaline Phosphatase: 199 U/L — ABNORMAL HIGH (ref 39–117)
BUN: 19 mg/dL (ref 6–23)
Glucose, Bld: 78 mg/dL (ref 70–99)
Potassium: 3.9 mEq/L (ref 3.5–5.3)
Sodium: 139 mEq/L (ref 135–145)
Total Bilirubin: 0.4 mg/dL (ref 0.3–1.2)
Total Protein: 7.1 g/dL (ref 6.0–8.3)

## 2012-05-11 LAB — URINALYSIS, MICROSCOPIC - CHCC
Bilirubin (Urine): NEGATIVE
Blood: NEGATIVE
Glucose: NEGATIVE g/dL
Ketones: NEGATIVE mg/dL
Specific Gravity, Urine: 1.02 (ref 1.003–1.035)

## 2012-05-11 MED ORDER — SODIUM CHLORIDE 0.9 % IV SOLN: Freq: Once | INTRAVENOUS | Status: DC

## 2012-05-11 MED ORDER — SODIUM CHLORIDE 0.9 % IJ SOLN
10.0000 mL | INTRAMUSCULAR | Status: DC | PRN
  Filled 2012-05-11: qty 10

## 2012-05-11 MED ORDER — SODIUM CHLORIDE 0.9 % IV SOLN
1600.0000 mg/m2 | INTRAVENOUS | Status: DC
  Administered 2012-05-11: 2750 mg via INTRAVENOUS
  Filled 2012-05-11: qty 55

## 2012-05-11 MED ORDER — FLUOROURACIL CHEMO INJECTION 500 MG/10ML
270.0000 mg/m2 | Freq: Once | INTRAVENOUS | Status: AC
  Administered 2012-05-11: 450 mg via INTRAVENOUS
  Filled 2012-05-11: qty 9

## 2012-05-11 MED ORDER — LEUCOVORIN CALCIUM INJECTION 350 MG
400.0000 mg/m2 | Freq: Once | INTRAVENOUS | Status: AC
  Administered 2012-05-11: 688 mg via INTRAVENOUS
  Filled 2012-05-11: qty 34.4

## 2012-05-11 MED ORDER — SODIUM CHLORIDE 0.9 % IV SOLN
Freq: Once | INTRAVENOUS | Status: AC
  Administered 2012-05-11: 100 mL via INTRAVENOUS

## 2012-05-11 NOTE — Telephone Encounter (Signed)
appts made and printed for pt pt aware that tx will be added

## 2012-05-11 NOTE — Progress Notes (Signed)
[facility]    OFFICE PROGRESS NOTE   INTERVAL HISTORY:   She returns as scheduled. No new complaint. She continues to have right-sided arthralgias. Celebrex did not help. No bleeding. No abdominal pain.  Objective:  Vital signs in last 24 hours:  There were no vitals taken for this visit.    HEENT: No thrush or ulcers, neck without mass Resp: Lungs clear bilaterally Cardio: Regular rate and rhythm GI: No hepatomegaly, nontender Vascular: No leg edema  Skin: Right chest wall without venous engorgement   Portacath/PICC-without erythema  Lab Results:  Lab Results  Component Value Date   WBC 4.7 05/11/2012   HGB 14.1 05/11/2012   HCT 42.2 05/11/2012   MCV 85.2 05/11/2012   PLT 81* 05/11/2012   ANC 3.4   Medications: I have reviewed the patient's current medications.  Assessment/Plan: 1.Metastatic colon cancer with multiple liver metastases noted on an MRI of the abdomen 12/02/2010 and on a CT 12/13/2010. A transverse colon mass was noted on the CT from 02/11/2011 and confirmed on a colonoscopy 12/17/2010. She began treatment with FOLFIRI/Avastin 12/31/2010. A restaging CT 06/20/2011 confirmed further improvement in the metastatic liver lesions and no evidence for progressive metastatic disease. A restaging CT 08/01/2011 showed stable liver lesions and no new lesions. Restaging CT 09/12/2011 showed stable liver lesions and no new lesions. A restaging CT 10/27/2011 showed stable liver lesions and no new lesions. Restaging CT 12/05/2011 showed mild decrease in size of hepatic metastases and no evidence of disease progression. Restaging CT 01/26/2012 showed stable hepatic metastases and no new lesions. Restaging CT may 5/8/ 2013 revealed a decreased size of hepatic metastases and no new lesions. Restaging CT 04/23/2012 revealed no change in the measurable "target" lesions, a slight increase in the size of other lesions, and no new lesions.  2. Abdominal pain secondary to  hepatomegaly/liver metastases-resolved.  3. Anorexia, early satiety, and weight loss secondary to metastatic colon cancer-resolved.  4. Microcytic anemia. Resolved.  5. History of migraine headaches.  6. Status post uterine fibroid surgery.  7. Port-A-Cath placement 12/20/2010.  8. Delayed nausea following cycle 1 of FOLFIRI/Avastin. Improved with the addition of Aloxi and prophylactic Decadron beginning with cycle #2. The prophylactic Decadron has been tapered. We increased the day 1 Decadron back to 20 mg.  9. Neutropenia secondary to chemotherapy. Cycle #4 was held on 02/11/2011. She has subsequently received Neulasta support.  10. Intermittent fever with no apparent source for infection, likely "tumor fever" or fever related to chemotherapy. No recent fever.  11. Mucositis secondary to 5-FU, initially grade 2. Irinotecan and 5-FU were dose reduced per study guidelines.  12. History of abdominal cramping. ? Related to irinotecan, 5-FU, or potentially the colon tumor. No recent abdominal cramping.  13. Thrombocytopenia secondary to chemotherapy. Treatment has been held for thrombocytopenia per study guidelines. The thrombocytopenia may be related to Avastin, or chronic liver disease from chemotherapy. The CT on 04/23/2012 confirmed mild splenomegaly. 14. Acne-like rash on the face. Question related to steroids. Resolved.  15. Arthralgias-most likely unrelated to the colon cancer diagnosis and chemotherapy. She has been evaluated by orthopedics. The etiology of the arthralgias is unclear. She recently completed a prednisone taper and is now taking Celebrex . She plans to begin a massage therapy program.   Disposition:  She appears stable. The thrombocytopenia has not changed despite being off of chemotherapy for the past month. I suspect the thrombocytopenia is related to chronic liver toxicity from irinotecan or Avastin. We  decided to resume treatment with 5-FU/leucovorin today. She will return  for an office visit and chemotherapy in 2 weeks.  Her ECoG performance status is measured at 0-1 today.   Thornton Papas, MD  05/11/2012  12:08 PM

## 2012-05-11 NOTE — Patient Instructions (Signed)
[facility] Discharge Instructions for Patients Receiving Chemotherapy  Today you received the following chemotherapy agents Leucovorin and 55fu  To help prevent nausea and vomiting after your treatment, we encourage you to take your nausea medication per  Dr. Truett Perna.  If you develop nausea and vomiting that is not controlled by your nausea medication, call the clinic. If it is after clinic hours your family physician or the after hours number for the clinic or go to the Emergency Department.   BELOW ARE SYMPTOMS THAT SHOULD BE REPORTED IMMEDIATELY:  *FEVER GREATER THAN 100.5 F  *CHILLS WITH OR WITHOUT FEVER  NAUSEA AND VOMITING THAT IS NOT CONTROLLED WITH YOUR NAUSEA MEDICATION  *UNUSUAL SHORTNESS OF BREATH  *UNUSUAL BRUISING OR BLEEDING  TENDERNESS IN MOUTH AND THROAT WITH OR WITHOUT PRESENCE OF ULCERS  *URINARY PROBLEMS  *BOWEL PROBLEMS  UNUSUAL RASH Items with * indicate a potential emergency and should be followed up as soon as possible.   Feel free to call the clinic you have any questions or concerns. The clinic phone number is [phone].   I have been informed and understand all the instructions given to me. I know to contact the clinic, my physician, or go to the Emergency Department if any problems should occur. I do not have any questions at this time, but understand that I may call the clinic during office hours   should I have any questions or need assistance in obtaining follow up care.    __________________________________________  _____________  __________ Signature of Patient or Authorized Representative            Date                   Time    __________________________________________ Nurse's Signature

## 2012-05-11 NOTE — Progress Notes (Signed)
May 11, 2012, [name] cycle 74 Natalie Mccarthy is in the cancer center today for alb work, physical exam, and chemotherapy. She continues to have some joint pain. Her lab results were reviewed by Dr. Truett Perna, who finds only the platelet count to be clinically significant.  After phone discussion with the Medical Monitor, Natalie Mccarthy will receive 5FU and leucovorin.  ECOG PS is 1 D/T continuing joint pain.   July 23, signed.

## 2012-05-11 NOTE — Progress Notes (Signed)
plts 81.  Pt. Was seen by Dr. Truett Perna and Corrie Dandy. Okay to proceed with current tmt. HL

## 2012-05-13 ENCOUNTER — Ambulatory Visit: Payer: BC Managed Care – PPO | Admitting: Oncology

## 2012-05-13 ENCOUNTER — Ambulatory Visit (HOSPITAL_BASED_OUTPATIENT_CLINIC_OR_DEPARTMENT_OTHER): Payer: BC Managed Care – PPO

## 2012-05-13 ENCOUNTER — Other Ambulatory Visit: Payer: BC Managed Care – PPO | Admitting: Lab

## 2012-05-13 DIAGNOSIS — Z452 Encounter for adjustment and management of vascular access device: Secondary | ICD-10-CM

## 2012-05-13 DIAGNOSIS — C787 Secondary malignant neoplasm of liver and intrahepatic bile duct: Secondary | ICD-10-CM

## 2012-05-13 DIAGNOSIS — C189 Malignant neoplasm of colon, unspecified: Secondary | ICD-10-CM

## 2012-05-13 DIAGNOSIS — C184 Malignant neoplasm of transverse colon: Secondary | ICD-10-CM

## 2012-05-13 MED ORDER — HEPARIN SOD (PORK) LOCK FLUSH 100 UNIT/ML IV SOLN
500.0000 [IU] | Freq: Once | INTRAVENOUS | Status: AC | PRN
  Administered 2012-05-13: 500 [IU]
  Filled 2012-05-13: qty 5

## 2012-05-13 MED ORDER — SODIUM CHLORIDE 0.9 % IJ SOLN
10.0000 mL | INTRAMUSCULAR | Status: DC | PRN
  Administered 2012-05-13: 10 mL
  Filled 2012-05-13: qty 10

## 2012-05-19 ENCOUNTER — Encounter: Payer: Self-pay | Admitting: *Deleted

## 2012-05-20 ENCOUNTER — Encounter: Payer: Self-pay | Admitting: *Deleted

## 2012-05-24 ENCOUNTER — Other Ambulatory Visit: Payer: Self-pay | Admitting: *Deleted

## 2012-05-24 DIAGNOSIS — C189 Malignant neoplasm of colon, unspecified: Secondary | ICD-10-CM

## 2012-05-25 ENCOUNTER — Ambulatory Visit (HOSPITAL_BASED_OUTPATIENT_CLINIC_OR_DEPARTMENT_OTHER): Payer: BC Managed Care – PPO | Admitting: Oncology

## 2012-05-25 ENCOUNTER — Encounter: Payer: Self-pay | Admitting: *Deleted

## 2012-05-25 ENCOUNTER — Telehealth: Payer: Self-pay | Admitting: Oncology

## 2012-05-25 ENCOUNTER — Other Ambulatory Visit (HOSPITAL_BASED_OUTPATIENT_CLINIC_OR_DEPARTMENT_OTHER): Payer: BC Managed Care – PPO | Admitting: Lab

## 2012-05-25 ENCOUNTER — Ambulatory Visit: Payer: BC Managed Care – PPO

## 2012-05-25 VITALS — BP 120/79 | HR 68 | Temp 97.0°F | Ht 68.0 in | Wt 151.2 lb

## 2012-05-25 DIAGNOSIS — C189 Malignant neoplasm of colon, unspecified: Secondary | ICD-10-CM

## 2012-05-25 DIAGNOSIS — Z5111 Encounter for antineoplastic chemotherapy: Secondary | ICD-10-CM

## 2012-05-25 LAB — CBC WITH DIFFERENTIAL/PLATELET
BASO%: 0.5 % (ref 0.0–2.0)
LYMPH%: 16.5 % (ref 14.0–49.7)
MCHC: 33.7 g/dL (ref 31.5–36.0)
MONO#: 0.4 10*3/uL (ref 0.1–0.9)
Platelets: 56 10*3/uL — ABNORMAL LOW (ref 145–400)
RBC: 4.82 10*6/uL (ref 3.70–5.45)
RDW: 18.1 % — ABNORMAL HIGH (ref 11.2–14.5)
WBC: 4 10*3/uL (ref 3.9–10.3)
lymph#: 0.7 10*3/uL — ABNORMAL LOW (ref 0.9–3.3)
nRBC: 0 % (ref 0–0)

## 2012-05-25 LAB — COMPREHENSIVE METABOLIC PANEL
ALT: 58 U/L — ABNORMAL HIGH (ref 0–35)
AST: 50 U/L — ABNORMAL HIGH (ref 0–37)
Calcium: 9.7 mg/dL (ref 8.4–10.5)
Chloride: 100 mEq/L (ref 96–112)
Creatinine, Ser: 0.85 mg/dL (ref 0.50–1.10)
Sodium: 136 mEq/L (ref 135–145)
Total Protein: 6.8 g/dL (ref 6.0–8.3)

## 2012-05-25 LAB — URINALYSIS, MICROSCOPIC - CHCC
Blood: NEGATIVE
Nitrite: NEGATIVE
Protein: NEGATIVE mg/dL
Specific Gravity, Urine: 1.03 (ref 1.003–1.035)

## 2012-05-25 LAB — RESEARCH LABS

## 2012-05-25 NOTE — Progress Notes (Signed)
Natalie Mccarthy was in the cancer center today for lab work, physical exam, and treatment on the [name] clinical trial. Seen by Dr. Truett Perna. She continues to experience shoulder pain.  She  clarified the doses of prednisone she took for the steroid taper when she first saw a doctor for the joint pain. It was as follows:  Prednisone 10 mg. PO. Take 4 tablets X2 days, Take 2 tablets X2 days.  Take 1 tablet X2 days.  Take 1/2 tablet X2 days.   Review of her lab results shows she is still thrombocytopenic. After discussion with the Medical Monitor for the study, the decision was made to hold treatment today.  Per Dr. Truett Perna, other abnormal lab results are not clinically significant.  She will return in 2 weeks after having a CT of the abdomen done on 06/04/12.

## 2012-05-25 NOTE — Progress Notes (Signed)
[facility]    OFFICE PROGRESS NOTE   INTERVAL HISTORY:   She returns as scheduled. She completed another cycle of systemic therapy with 5-FU/leucovorin on 05/11/2012. No nausea, bleeding, or pain. No new complaint. She continues to have arthralgias.  Objective:  Vital signs in last 24 hours:  Blood pressure 120/79, pulse 68, temperature 97 F (36.1 C), temperature source Oral, height 5\' 8"  (1.727 m), weight 151 lb 3.2 oz (68.584 kg).    HEENT: No thrush or ulcers Resp: Lungs clear bilaterally Cardio: Regular rate and rhythm GI: No hepatosplenomegaly, nontender Vascular: No leg edema  Portacath/PICC-without erythema  Lab Results:  Lab Results  Component Value Date   WBC 4.0 05/25/2012   HGB 13.8 05/25/2012   HCT 40.9 05/25/2012   MCV 84.9 05/25/2012   PLT 56* 05/25/2012   ANC 2.8    Medications: I have reviewed the patient's current medications.  Assessment/Plan: 1.Metastatic colon cancer with multiple liver metastases noted on an MRI of the abdomen 12/02/2010 and on a CT 12/13/2010. A transverse colon mass was noted on the CT from 02/11/2011 and confirmed on a colonoscopy 12/17/2010. She began treatment with FOLFIRI/Avastin 12/31/2010. A restaging CT 06/20/2011 confirmed further improvement in the metastatic liver lesions and no evidence for progressive metastatic disease. A restaging CT 08/01/2011 showed stable liver lesions and no new lesions. Restaging CT 09/12/2011 showed stable liver lesions and no new lesions. A restaging CT 10/27/2011 showed stable liver lesions and no new lesions. Restaging CT 12/05/2011 showed mild decrease in size of hepatic metastases and no evidence of disease progression. Restaging CT 01/26/2012 showed stable hepatic metastases and no new lesions. Restaging CT may 5/8/ 2013 revealed a decreased size of hepatic metastases and no new lesions. Restaging CT 04/23/2012 revealed no change in the measurable "target" lesions, a slight  increase in the size of other lesions, and no new lesions.  2. Abdominal pain secondary to hepatomegaly/liver metastases-resolved.  3. Anorexia, early satiety, and weight loss secondary to metastatic colon cancer-resolved.  4. Microcytic anemia. Resolved.  5. History of migraine headaches.  6. Status post uterine fibroid surgery.  7. Port-A-Cath placement 12/20/2010.  8. Delayed nausea following cycle 1 of FOLFIRI/Avastin. Improved with the addition of Aloxi and prophylactic Decadron beginning with cycle #2. The prophylactic Decadron has been tapered. We increased the day 1 Decadron back to 20 mg.  9. Neutropenia secondary to chemotherapy. Cycle #4 was held on 02/11/2011. She has subsequently received Neulasta support.  10. Intermittent fever with no apparent source for infection, likely "tumor fever" or fever related to chemotherapy. No recent fever.  11. Mucositis secondary to 5-FU, initially grade 2. Irinotecan and 5-FU were dose reduced per study guidelines.  12. History of abdominal cramping. ? Related to irinotecan, 5-FU, or potentially the colon tumor. No recent abdominal cramping.  13. Thrombocytopenia secondary to chemotherapy. Treatment has been held for thrombocytopenia per study guidelines. The thrombocytopenia may be related to Avastin, or chronic liver disease from chemotherapy. The CT on 04/23/2012 confirmed mild splenomegaly.  14. Acne-like rash on the face. Question related to steroids. Resolved.  15. Arthralgias-most likely unrelated to the colon cancer diagnosis and chemotherapy. She has been evaluated by orthopedics. The etiology of the arthralgias is unclear.  She plans to begin a massage therapy program.    Disposition:  The thrombocytopenia persists. We contacted the protocol chair and treatment will be held today. She will return for an office visit and scheduled chemotherapy in 2 weeks. She will  undergo a restaging CT evaluation prior to the next office visit.  The  thrombocytopenia is most likely related to an effect of chronic systemic therapy with irinotecan and Avastin. There may be a component of chronic liver toxicity causing thrombocytopenia and splenomegaly. She will contact us for bleeding.   Thornton Papas, MD  05/25/2012  6:35 PM

## 2012-05-25 NOTE — Telephone Encounter (Signed)
gv pt appt schedule for July/August and ct for 8/2. Pt aware I will get back w/her re chemo for 8/6.

## 2012-05-28 ENCOUNTER — Telehealth: Payer: Self-pay | Admitting: Oncology

## 2012-05-28 NOTE — Telephone Encounter (Signed)
appt for 8/6 added. S/w pt she is aware and already has schedule for 8/5.

## 2012-06-01 ENCOUNTER — Ambulatory Visit: Payer: BC Managed Care – PPO

## 2012-06-04 ENCOUNTER — Other Ambulatory Visit: Payer: Self-pay | Admitting: *Deleted

## 2012-06-04 ENCOUNTER — Ambulatory Visit (HOSPITAL_COMMUNITY)
Admission: RE | Admit: 2012-06-04 | Discharge: 2012-06-04 | Disposition: A | Discharge status: Home / Self Care | Payer: BC Managed Care – PPO | Source: Ambulatory Visit | Attending: Oncology | Admitting: Oncology

## 2012-06-04 DIAGNOSIS — C189 Malignant neoplasm of colon, unspecified: Secondary | ICD-10-CM

## 2012-06-04 DIAGNOSIS — C787 Secondary malignant neoplasm of liver and intrahepatic bile duct: Secondary | ICD-10-CM | POA: Insufficient documentation

## 2012-06-04 DIAGNOSIS — R161 Splenomegaly, not elsewhere classified: Secondary | ICD-10-CM | POA: Insufficient documentation

## 2012-06-04 MED ORDER — IOHEXOL 300 MG/ML  SOLN
100.0000 mL | Freq: Once | INTRAMUSCULAR | Status: AC | PRN
  Administered 2012-06-04: 100 mL via INTRAVENOUS

## 2012-06-05 ENCOUNTER — Other Ambulatory Visit: Payer: Self-pay | Admitting: Oncology

## 2012-06-07 ENCOUNTER — Encounter: Payer: BC Managed Care – PPO | Admitting: *Deleted

## 2012-06-07 ENCOUNTER — Ambulatory Visit (HOSPITAL_BASED_OUTPATIENT_CLINIC_OR_DEPARTMENT_OTHER): Payer: BC Managed Care – PPO | Admitting: Nurse Practitioner

## 2012-06-07 ENCOUNTER — Telehealth: Payer: Self-pay | Admitting: Oncology

## 2012-06-07 ENCOUNTER — Other Ambulatory Visit (HOSPITAL_BASED_OUTPATIENT_CLINIC_OR_DEPARTMENT_OTHER): Payer: BC Managed Care – PPO | Admitting: Lab

## 2012-06-07 ENCOUNTER — Other Ambulatory Visit: Payer: Self-pay | Admitting: Oncology

## 2012-06-07 VITALS — BP 115/79 | HR 69 | Temp 97.1°F | Resp 20 | Ht 68.0 in | Wt 152.2 lb

## 2012-06-07 DIAGNOSIS — C184 Malignant neoplasm of transverse colon: Secondary | ICD-10-CM

## 2012-06-07 DIAGNOSIS — C189 Malignant neoplasm of colon, unspecified: Secondary | ICD-10-CM

## 2012-06-07 DIAGNOSIS — C787 Secondary malignant neoplasm of liver and intrahepatic bile duct: Secondary | ICD-10-CM

## 2012-06-07 DIAGNOSIS — D702 Other drug-induced agranulocytosis: Secondary | ICD-10-CM

## 2012-06-07 DIAGNOSIS — D696 Thrombocytopenia, unspecified: Secondary | ICD-10-CM

## 2012-06-07 LAB — CBC WITH DIFFERENTIAL/PLATELET
BASO%: 0.7 % (ref 0.0–2.0)
EOS%: 3.3 % (ref 0.0–7.0)
MCH: 28.8 pg (ref 25.1–34.0)
MCHC: 33.6 g/dL (ref 31.5–36.0)
MONO%: 11 % (ref 0.0–14.0)
RBC: 4.92 10*6/uL (ref 3.70–5.45)
RDW: 18.5 % — ABNORMAL HIGH (ref 11.2–14.5)
lymph#: 0.5 10*3/uL — ABNORMAL LOW (ref 0.9–3.3)

## 2012-06-07 LAB — COMPREHENSIVE METABOLIC PANEL
ALT: 65 U/L — ABNORMAL HIGH (ref 0–35)
AST: 58 U/L — ABNORMAL HIGH (ref 0–37)
Albumin: 3.7 g/dL (ref 3.5–5.2)
Alkaline Phosphatase: 265 U/L — ABNORMAL HIGH (ref 39–117)
Calcium: 9.8 mg/dL (ref 8.4–10.5)
Chloride: 104 mEq/L (ref 96–112)
Creatinine, Ser: 0.73 mg/dL (ref 0.50–1.10)
Potassium: 4.1 mEq/L (ref 3.5–5.3)

## 2012-06-07 LAB — URINALYSIS, MICROSCOPIC - CHCC
Nitrite: NEGATIVE
Protein: NEGATIVE mg/dL
Specific Gravity, Urine: 1.01 (ref 1.003–1.035)
WBC, UA: NEGATIVE (ref 0–2)
pH: 5 (ref 4.6–8.0)

## 2012-06-07 NOTE — Progress Notes (Signed)
OFFICE PROGRESS NOTE  Interval history:  Natalie Mccarthy returns as scheduled. She was last treated with 5-FU/leucovorin as per the FOLFIRI regimen 05/11/2012. Restaging CT evaluation 06/04/2012 showed metastatic disease involving the liver was grossly unchanged.  Natalie Mccarthy reports that she feels well. She has a good appetite and good energy level. No nausea or vomiting. She has a single mouth sore that is healing. She denies diarrhea. She intermittently notes blood when she blows her nose. No shortness of breath or chest pain. No leg swelling or calf pain. She denies abdominal pain.   Objective: Blood pressure 115/79, pulse 69, temperature 97.1 F (36.2 C), temperature source Oral, resp. rate 20, height 5\' 8"  (1.727 m), weight 152 lb 3.2 oz (69.037 kg).  Oropharynx is without thrush or ulceration. Lungs are clear. Regular cardiac rhythm. Port-A-Cath site is without erythema. Abdomen is soft and nontender. No organomegaly. Extremities are without edema. Calves are soft and nontender.  Lab Results: Lab Results  Component Value Date   WBC 3.5* 06/07/2012   HGB 14.2 06/07/2012   HCT 42.3 06/07/2012   MCV 85.9 06/07/2012   PLT 67* 06/07/2012    Chemistry:    Chemistry      Component Value Date/Time   NA 140 06/07/2012 1205   NA 143 01/27/2012 1354   K 4.1 06/07/2012 1205   K 3.8 01/27/2012 1354   CL 104 06/07/2012 1205   CL 97* 01/27/2012 1354   CO2 29 06/07/2012 1205   CO2 29 01/27/2012 1354   BUN 16 06/07/2012 1205   BUN 11 01/27/2012 1354   CREATININE 0.73 06/07/2012 1205   CREATININE 0.9 01/27/2012 1354      Component Value Date/Time   CALCIUM 9.8 06/07/2012 1205   CALCIUM 8.9 01/27/2012 1354   ALKPHOS 265* 06/07/2012 1205   ALKPHOS 198* 01/27/2012 1354   AST 58* 06/07/2012 1205   AST 34 01/27/2012 1354   ALT 65* 06/07/2012 1205   BILITOT 0.5 06/07/2012 1205   BILITOT 0.50 01/27/2012 1354       Studies/Results: Ct Abdomen W Contrast  06/04/2012  *RADIOLOGY REPORT*  Clinical Data: Restaging colon cancer   CT ABDOMEN WITH CONTRAST  --  RECIST Protocol 1.1  Target Lesions:  1. Central hepatic lesion measures 4.8 cm (series 2/image 14), previously 5.1 cm  2. Posterior left hepatic lobe lesion measures 1.9 cm ( series 2/image 14), unchanged  Non-Target Lesions:  1. Posterior right hepatic lobe lesion is present (series 2/image 11).  --  Technique:  Multidetector CT imaging of the abdomen was performed following the standard protocol during bolus administration of intravenous contrast.  Contrast: OMNIPAQUE IOHEXOL 300 MG/ML  SOLN  Comparison: 04/23/2012  Findings: Multiple hepatic metastases, grossly unchanged, including: --4.1 x 2.9 cm lesion, right hepatic dome (series 2/image 7), previously 4.2 x 2.7 cm --5.0 x 5.7 cm lesion, central liver superiorly (series 2/image 11), previously 5.0 x 5.7 cm --2.1 x 1.7 cm lesion, posterior right hepatic dome (series 2/image 11), previously 2.2 x 1.8 cm --4.1 x 4.8 cm lesion, central liver inferiorly (series 2/image 14), previously 4.5 x 5.1 cm --1.5 x 1.9 cm lesion, posterior left hepatic lobe (series 2/image 14), previously 1.7 x 1.9 cm --11 mm lesion, anterior right hepatic lobe (series 2/image 16), previously 9 mm  Scattered additional tiny lesions which are too small to characterize.  Splenomegaly, measuring 16.3 cm in maximal dimension.  Pancreas and adrenal glands are within normal limits.  Gallbladder is unremarkable.  No intrahepatic or  extrahepatic ductal dilatation.  Kidneys are within normal limits.  No hydronephrosis.  Solid bowel is unremarkable.  Normal appendix.  No abdominal ascites.  No suspicious abdominal lymphadenopathy.  Visualized osseous structures are within normal limits.  IMPRESSION: Multiple hepatic metastases, measuring up to 5.7 cm as described above, grossly unchanged.  RECIST 1.1 measurements as above.  Original Report Authenticated By: Charline Bills, M.D.    Medications: I have reviewed the patient's current  medications.  Assessment/Plan:  1.Metastatic colon cancer with multiple liver metastases noted on an MRI of the abdomen 12/02/2010 and on a CT 12/13/2010. A transverse colon mass was noted on the CT from 02/11/2011 and confirmed on a colonoscopy 12/17/2010. She began treatment with FOLFIRI/Avastin 12/31/2010. A restaging CT 06/20/2011 confirmed further improvement in the metastatic liver lesions and no evidence for progressive metastatic disease. A restaging CT 08/01/2011 showed stable liver lesions and no new lesions. Restaging CT 09/12/2011 showed stable liver lesions and no new lesions. A restaging CT 10/27/2011 showed stable liver lesions and no new lesions. Restaging CT 12/05/2011 showed mild decrease in size of hepatic metastases and no evidence of disease progression. Restaging CT 01/26/2012 showed stable hepatic metastases and no new lesions. Restaging CT may 5/8/ 2013 revealed a decreased size of hepatic metastases and no new lesions. Restaging CT 04/23/2012 revealed no change in the measurable "target" lesions, a slight increase in the size of other lesions, and no new lesions. Restaging CT 06/04/2012 showed multiple hepatic metastases were grossly unchanged. 2. Abdominal pain secondary to hepatomegaly/liver metastases-resolved.  3. Anorexia, early satiety, and weight loss secondary to metastatic colon cancer-resolved.  4. Microcytic anemia. Resolved.  5. History of migraine headaches.  6. Status post uterine fibroid surgery.  7. Port-A-Cath placement 12/20/2010.  8. Delayed nausea following cycle 1 of FOLFIRI/Avastin. Improved with the addition of Aloxi and prophylactic Decadron beginning with cycle #2. The prophylactic Decadron has been tapered. We increased the day 1 Decadron back to 20 mg.  9. Neutropenia secondary to chemotherapy. Cycle #4 was held on 02/11/2011. She has subsequently received Neulasta support.  10. Intermittent fever with no apparent source for infection, likely "tumor  fever" or fever related to chemotherapy. No recent fever.  11. Mucositis secondary to 5-FU, initially grade 2. Irinotecan and 5-FU were dose reduced per study guidelines.  12. History of abdominal cramping. ? Related to irinotecan, 5-FU, or potentially the colon tumor. No recent abdominal cramping.  13. Thrombocytopenia secondary to chemotherapy. Treatment has been held for thrombocytopenia per study guidelines. The thrombocytopenia may be related to Avastin, or chronic liver disease from chemotherapy. The CT on 04/23/2012 confirmed mild splenomegaly. The CT on 06/04/2012 again showed splenomegaly. 14. Acne-like rash on the face. Question related to steroids. Resolved.  15. Arthralgias-most likely unrelated to the colon cancer diagnosis and chemotherapy. She has been evaluated by orthopedics. The etiology of the arthralgias is unclear.   Disposition-Natalie Mccarthy appears stable. She has persistent thrombocytopenia. Dr. Truett Perna recommends continuing treatment with 5-FU/leucovorin as per the FOLFIRI regimen (hold irinotecan and Avastin). She is scheduled to return for the next cycle on 06/08/2012. She will return for a followup visit and chemotherapy in 2 weeks. The protocol chair for the study will be contacted today with updated lab results.  ECoG performance status measured at 0.  Patient seen with Dr. Truett Perna.  Lonna Cobb ANP/GNP-BC

## 2012-06-07 NOTE — Progress Notes (Signed)
Patient in to clinic today for evaluation prior to scheduled cycle 33 treatment on 06/08/2012. Patient states she feels well and has no significant problems to report. Blood pressure was obtained in seated position after bladder emptied for urine sample in laboratory. Blood pressure reading verified by research nurse.   Per Dr. Truett Perna, CT scans show evidence of stable disease without progression. Per MD, patient should receive treatment with 5-FU on 8/6. Request for Medical Review form submitted to [facility] requesting permission to continue treatment with 5-FU in spite of platelet count less than 100,000. Per [facility] Medical Monitor, Dr. Christin Bach, treatment should ideally be held until platelet count reaches 90,000. However, for this patient with continued low platelet count after 2 week delay, if the treating physician feels it is in the patient's best interest to continue treatment with 5-FU, dosing at reduced dose level -3 may be administered tomorrow. Discussed with Dr. Truett Perna who is in agreement with this plan. Confirmed plans with Dr. Christin Bach via email.  Research blood samples to be collected from port prior to treatment on 06/08/2012.

## 2012-06-07 NOTE — Telephone Encounter (Signed)
Gave pt appt for August 2013 lab and MD, gave chemo order to [facility]

## 2012-06-08 ENCOUNTER — Ambulatory Visit (HOSPITAL_BASED_OUTPATIENT_CLINIC_OR_DEPARTMENT_OTHER): Payer: BC Managed Care – PPO

## 2012-06-08 VITALS — BP 134/77 | HR 74 | Temp 97.3°F | Resp 16

## 2012-06-08 DIAGNOSIS — Z5111 Encounter for antineoplastic chemotherapy: Secondary | ICD-10-CM

## 2012-06-08 DIAGNOSIS — C189 Malignant neoplasm of colon, unspecified: Secondary | ICD-10-CM

## 2012-06-08 DIAGNOSIS — C184 Malignant neoplasm of transverse colon: Secondary | ICD-10-CM

## 2012-06-08 DIAGNOSIS — C787 Secondary malignant neoplasm of liver and intrahepatic bile duct: Secondary | ICD-10-CM

## 2012-06-08 MED ORDER — SODIUM CHLORIDE 0.9 % IV SOLN
Freq: Once | INTRAVENOUS | Status: AC
  Administered 2012-06-08: 10:00:00 via INTRAVENOUS

## 2012-06-08 MED ORDER — FLUOROURACIL CHEMO INJECTION 500 MG/10ML
230.0000 mg/m2 | Freq: Once | INTRAVENOUS | Status: AC
  Administered 2012-06-08: 400 mg via INTRAVENOUS
  Filled 2012-06-08: qty 8

## 2012-06-08 MED ORDER — LEUCOVORIN CALCIUM INJECTION 350 MG
400.0000 mg/m2 | Freq: Once | INTRAVENOUS | Status: AC
  Administered 2012-06-08: 728 mg via INTRAVENOUS
  Filled 2012-06-08: qty 36.4

## 2012-06-08 MED ORDER — SODIUM CHLORIDE 0.9 % IV SOLN
1360.0000 mg/m2 | INTRAVENOUS | Status: DC
  Administered 2012-06-08: 2500 mg via INTRAVENOUS
  Filled 2012-06-08: qty 50

## 2012-06-08 NOTE — Progress Notes (Signed)
Confirmation of treatment plans received via email from medical monitor. Patient in to clinic today for treatment. Explained plans for treatment to patient and husband regarding reduced dose of chemotherapy and continued treatment on study, as recommended by medical monitor and agreed upon by Dr. Truett Perna. Patient is aware that physician may discontinue study treatment at any time that he does not feel it is in her best interest to continue. Patient voiced understanding of the decision.  Research samples collected from port by infusion nurse prior to treatment. Sign for infusion given to infusion nurse, Nelly Laurence.

## 2012-06-08 NOTE — Patient Instructions (Signed)
[facility] Discharge Instructions for Patients Receiving Chemotherapy  Today you received the following chemotherapy agents Leucovorin and 5FU  To help prevent nausea and vomiting after your treatment, we encourage you to take your nausea medication as prescribed.   If you develop nausea and vomiting that is not controlled by your nausea medication, call the clinic. If it is after clinic hours your family physician or the after hours number for the clinic or go to the Emergency Department.   BELOW ARE SYMPTOMS THAT SHOULD BE REPORTED IMMEDIATELY:  *FEVER GREATER THAN 100.5 F  *CHILLS WITH OR WITHOUT FEVER  NAUSEA AND VOMITING THAT IS NOT CONTROLLED WITH YOUR NAUSEA MEDICATION  *UNUSUAL SHORTNESS OF BREATH  *UNUSUAL BRUISING OR BLEEDING  TENDERNESS IN MOUTH AND THROAT WITH OR WITHOUT PRESENCE OF ULCERS  *URINARY PROBLEMS  *BOWEL PROBLEMS  UNUSUAL RASH Items with * indicate a potential emergency and should be followed up as soon as possible.  One of the nurses will contact you 24 hours after your treatment. Please let the nurse know about any problems that you may have experienced. Feel free to call the clinic you have any questions or concerns. The clinic phone number is [phone].   I have been informed and understand all the instructions given to me. I know to contact the clinic, my physician, or go to the Emergency Department if any problems should occur. I do not have any questions at this time, but understand that I may call the clinic during office hours   should I have any questions or need assistance in obtaining follow up care.    __________________________________________  _____________  __________ Signature of Patient or Authorized Representative            Date                   Time    __________________________________________ Nurse's Signature    

## 2012-06-10 ENCOUNTER — Other Ambulatory Visit: Payer: Self-pay | Admitting: *Deleted

## 2012-06-10 ENCOUNTER — Ambulatory Visit (HOSPITAL_BASED_OUTPATIENT_CLINIC_OR_DEPARTMENT_OTHER): Payer: BC Managed Care – PPO

## 2012-06-10 VITALS — BP 109/75 | HR 76 | Temp 97.3°F

## 2012-06-10 DIAGNOSIS — C189 Malignant neoplasm of colon, unspecified: Secondary | ICD-10-CM

## 2012-06-10 DIAGNOSIS — C787 Secondary malignant neoplasm of liver and intrahepatic bile duct: Secondary | ICD-10-CM

## 2012-06-10 DIAGNOSIS — C184 Malignant neoplasm of transverse colon: Secondary | ICD-10-CM

## 2012-06-10 DIAGNOSIS — Z452 Encounter for adjustment and management of vascular access device: Secondary | ICD-10-CM

## 2012-06-10 MED ORDER — HEPARIN SOD (PORK) LOCK FLUSH 100 UNIT/ML IV SOLN
500.0000 [IU] | Freq: Once | INTRAVENOUS | Status: AC | PRN
  Administered 2012-06-10: 500 [IU]
  Filled 2012-06-10: qty 5

## 2012-06-10 MED ORDER — LIDOCAINE-PRILOCAINE 2.5-2.5 % EX CREA: TOPICAL_CREAM | CUTANEOUS | Status: AC | PRN

## 2012-06-10 MED ORDER — SODIUM CHLORIDE 0.9 % IJ SOLN
10.0000 mL | INTRAMUSCULAR | Status: DC | PRN
  Administered 2012-06-10: 10 mL
  Filled 2012-06-10: qty 10

## 2012-06-10 NOTE — Patient Instructions (Signed)
Call MD with any problems

## 2012-06-11 ENCOUNTER — Other Ambulatory Visit: Payer: Self-pay | Admitting: *Deleted

## 2012-06-11 DIAGNOSIS — C189 Malignant neoplasm of colon, unspecified: Secondary | ICD-10-CM

## 2012-06-11 MED ORDER — ONDANSETRON HCL 8 MG PO TABS: 8.0000 mg | ORAL_TABLET | Freq: Two times a day (BID) | ORAL | Status: DC | PRN

## 2012-06-20 ENCOUNTER — Other Ambulatory Visit: Payer: Self-pay | Admitting: Oncology

## 2012-06-22 ENCOUNTER — Other Ambulatory Visit (HOSPITAL_BASED_OUTPATIENT_CLINIC_OR_DEPARTMENT_OTHER): Payer: BC Managed Care – PPO | Admitting: Lab

## 2012-06-22 ENCOUNTER — Ambulatory Visit (HOSPITAL_BASED_OUTPATIENT_CLINIC_OR_DEPARTMENT_OTHER): Payer: BC Managed Care – PPO | Admitting: Oncology

## 2012-06-22 ENCOUNTER — Telehealth: Payer: Self-pay | Admitting: Oncology

## 2012-06-22 ENCOUNTER — Encounter: Payer: Self-pay | Admitting: *Deleted

## 2012-06-22 ENCOUNTER — Ambulatory Visit (HOSPITAL_BASED_OUTPATIENT_CLINIC_OR_DEPARTMENT_OTHER): Payer: BC Managed Care – PPO

## 2012-06-22 VITALS — BP 112/68 | HR 78 | Temp 97.0°F | Resp 18 | Ht 68.0 in | Wt 152.4 lb

## 2012-06-22 DIAGNOSIS — C787 Secondary malignant neoplasm of liver and intrahepatic bile duct: Secondary | ICD-10-CM

## 2012-06-22 DIAGNOSIS — M2550 Pain in unspecified joint: Secondary | ICD-10-CM

## 2012-06-22 DIAGNOSIS — C184 Malignant neoplasm of transverse colon: Secondary | ICD-10-CM

## 2012-06-22 DIAGNOSIS — Z5111 Encounter for antineoplastic chemotherapy: Secondary | ICD-10-CM

## 2012-06-22 DIAGNOSIS — C189 Malignant neoplasm of colon, unspecified: Secondary | ICD-10-CM

## 2012-06-22 DIAGNOSIS — D6959 Other secondary thrombocytopenia: Secondary | ICD-10-CM

## 2012-06-22 LAB — CBC WITH DIFFERENTIAL/PLATELET
BASO%: 0.7 % (ref 0.0–2.0)
Basophils Absolute: 0 10*3/uL (ref 0.0–0.1)
EOS%: 4.1 % (ref 0.0–7.0)
HGB: 13.6 g/dL (ref 11.6–15.9)
MCH: 29.3 pg (ref 25.1–34.0)
MCHC: 34.2 g/dL (ref 31.5–36.0)
RBC: 4.63 10*6/uL (ref 3.70–5.45)
RDW: 18.2 % — ABNORMAL HIGH (ref 11.2–14.5)
lymph#: 0.5 10*3/uL — ABNORMAL LOW (ref 0.9–3.3)

## 2012-06-22 LAB — URINALYSIS, MICROSCOPIC - CHCC
Nitrite: NEGATIVE
Protein: NEGATIVE mg/dL
WBC, UA: NEGATIVE (ref 0–2)
pH: 6 (ref 4.6–8.0)

## 2012-06-22 LAB — COMPREHENSIVE METABOLIC PANEL
ALT: 36 U/L — ABNORMAL HIGH (ref 0–35)
AST: 36 U/L (ref 0–37)
Albumin: 3.8 g/dL (ref 3.5–5.2)
Calcium: 9.7 mg/dL (ref 8.4–10.5)
Chloride: 103 mEq/L (ref 96–112)
Potassium: 3.8 mEq/L (ref 3.5–5.3)
Sodium: 139 mEq/L (ref 135–145)
Total Protein: 6.7 g/dL (ref 6.0–8.3)

## 2012-06-22 MED ORDER — HEPARIN SOD (PORK) LOCK FLUSH 100 UNIT/ML IV SOLN
500.0000 [IU] | Freq: Once | INTRAVENOUS | Status: DC | PRN
  Filled 2012-06-22: qty 5

## 2012-06-22 MED ORDER — SODIUM CHLORIDE 0.9 % IV SOLN
Freq: Once | INTRAVENOUS | Status: AC
  Administered 2012-06-22: 14:00:00 via INTRAVENOUS

## 2012-06-22 MED ORDER — DEXTROSE 5 % IV SOLN
400.0000 mg/m2 | Freq: Once | INTRAVENOUS | Status: AC
  Administered 2012-06-22: 728 mg via INTRAVENOUS
  Filled 2012-06-22: qty 36.4

## 2012-06-22 MED ORDER — FLUOROURACIL CHEMO INJECTION 500 MG/10ML
230.0000 mg/m2 | Freq: Once | INTRAVENOUS | Status: AC
  Administered 2012-06-22: 400 mg via INTRAVENOUS
  Filled 2012-06-22: qty 8

## 2012-06-22 MED ORDER — SODIUM CHLORIDE 0.9 % IJ SOLN
10.0000 mL | INTRAMUSCULAR | Status: DC | PRN
  Filled 2012-06-22: qty 10

## 2012-06-22 MED ORDER — SODIUM CHLORIDE 0.9 % IV SOLN
1360.0000 mg/m2 | INTRAVENOUS | Status: DC
  Administered 2012-06-22: 2500 mg via INTRAVENOUS
  Filled 2012-06-22: qty 50

## 2012-06-22 NOTE — Progress Notes (Signed)
06/22/2012. [name], cycle 34. Natalie Mccarthy is in the cancer center today for lab work, physical exam, and treatment.she has been receiving 5FU and Leucovorin alone D/T continuing thrombocytopenia. Her platelet count today is 69,000. Per dr. Truett Perna, other abnormal lab results are not clinically significant.  After communication with the medical monitor for the study, she will continue on 5FU and leucovorin.  She will be scheduled to return in 2 weeks.  Review of adverse events, she has continuing joint pain, Her taste has improved significantly. She did not have other adverse events to report.    08/12/12 entered to sign. MW

## 2012-06-22 NOTE — Telephone Encounter (Signed)
appts made and printed for pt,pt aware that tx will follow on 9/4 and 9/21,email to mw to add

## 2012-06-22 NOTE — Progress Notes (Signed)
[facility]    OFFICE PROGRESS NOTE   INTERVAL HISTORY:   She returns as scheduled. She completed another cycle of 5-fluorouracil on 06/08/2012. No mouth sores, nausea, or diarrhea. No abdominal pain. She continues to work. Progress the arthralgias are unchanged. She has noted discomfort in the left antecubital region following the most recent CT scan. She received the IV contrast in this region. No arm swelling or erythema.  Objective:  Vital signs in last 24 hours:  Blood pressure 112/68, pulse 78, temperature 97 F (36.1 C), temperature source Oral, resp. rate 18, height 5\' 8"  (1.727 m), weight 152 lb 6.4 oz (69.128 kg).    HEENT: No thrush or ulcers Resp: Lungs clear bilaterally Cardio: Regular rate and rhythm GI: No hepatosplenomegaly, nontender Vascular: No leg edema. No left arm edema, erythema, or palpable cord  Portacath/PICC-without erythema  Lab Results:  Lab Results  Component Value Date   WBC 3.5* 06/22/2012   HGB 13.6 06/22/2012   HCT 39.7 06/22/2012   MCV 85.6 06/22/2012   PLT 69* 06/22/2012   ANC 2.4    Medications: I have reviewed the patient's current medications.  Assessment/Plan: 1.Metastatic colon cancer with multiple liver metastases noted on an MRI of the abdomen 12/02/2010 and on a CT 12/13/2010. A transverse colon mass was noted on the CT from 02/11/2011 and confirmed on a colonoscopy 12/17/2010. She began treatment with FOLFIRI/Avastin 12/31/2010. A restaging CT 06/20/2011 confirmed further improvement in the metastatic liver lesions and no evidence for progressive metastatic disease. A restaging CT 08/01/2011 showed stable liver lesions and no new lesions. Restaging CT 09/12/2011 showed stable liver lesions and no new lesions. A restaging CT 10/27/2011 showed stable liver lesions and no new lesions. Restaging CT 12/05/2011 showed mild decrease in size of hepatic metastases and no evidence of disease progression. Restaging CT  01/26/2012 showed stable hepatic metastases and no new lesions. Restaging CT may 5/8/ 2013 revealed a decreased size of hepatic metastases and no new lesions. Restaging CT 04/23/2012 revealed no change in the measurable "target" lesions, a slight increase in the size of other lesions, and no new lesions. Restaging CT 06/04/2012 showed multiple hepatic metastases were grossly unchanged.  2. Abdominal pain secondary to hepatomegaly/liver metastases-resolved.  3. Anorexia, early satiety, and weight loss secondary to metastatic colon cancer-resolved.  4. Microcytic anemia. Resolved.  5. History of migraine headaches.  6. Status post uterine fibroid surgery.  7. Port-A-Cath placement 12/20/2010.  8. Delayed nausea following cycle 1 of FOLFIRI/Avastin. Improved with the addition of Aloxi and prophylactic Decadron beginning with cycle #2. The prophylactic Decadron has been tapered. We increased the day 1 Decadron back to 20 mg.  9. Neutropenia secondary to chemotherapy. Cycle #4 was held on 02/11/2011. She subsequently received Neulasta support.  10. Intermittent fever with no apparent source for infection, likely "tumor fever" or fever related to chemotherapy. No recent fever.  11. Mucositis secondary to 5-FU, initially grade 2. Irinotecan and 5-FU were dose reduced per study guidelines.  12. History of abdominal cramping. ? Related to irinotecan, 5-FU, or potentially the colon tumor. No recent abdominal cramping.  13. Thrombocytopenia secondary to chemotherapy. Treatment has been held for thrombocytopenia per study guidelines. The thrombocytopenia may be related to Avastin, or chronic liver disease from chemotherapy. The CT on 04/23/2012 confirmed mild splenomegaly. The CT on 06/04/2012 again showed splenomegaly. The thrombocytopenia persists despite being off of irinotecan and Avastin for several months. 14. Acne-like rash on the face. Question related to steroids.  Resolved.  15. Arthralgias-most likely  unrelated to the colon cancer diagnosis and chemotherapy. She has been evaluated by orthopedics. The etiology of the arthralgias is unclear.  16. Discomfort at the left antecubital region following an IV stick for a CT scan-no clinical evidence of thrombophlebitis  Disposition:  She appears stable. The platelet count is unchanged. The plan is to continue infusional 5-FU on study. I suspect the thrombocytopenia is related to chronic liver toxicity from systemic therapy versus ITP. She will return for an office visit and chemotherapy in 2 weeks.   Thornton Papas, MD  06/22/2012  11:39 AM

## 2012-06-22 NOTE — Patient Instructions (Addendum)
[facility] Discharge Instructions for Patients Receiving Chemotherapy  Today you received the following chemotherapy agents leucovorin/ 5FU  To help prevent nausea and vomiting after your treatment, we encourage you to take your nausea medication and take it as often as prescribed for the next If you develop nausea and vomiting that is not controlled by your nausea medication, call the clinic. If it is after clinic hours your family physician or the after hours number for the clinic or go to the Emergency Department.   BELOW ARE SYMPTOMS THAT SHOULD BE REPORTED IMMEDIATELY:  *FEVER GREATER THAN 100.5 F  *CHILLS WITH OR WITHOUT FEVER  NAUSEA AND VOMITING THAT IS NOT CONTROLLED WITH YOUR NAUSEA MEDICATION  *UNUSUAL SHORTNESS OF BREATH  *UNUSUAL BRUISING OR BLEEDING  TENDERNESS IN MOUTH AND THROAT WITH OR WITHOUT PRESENCE OF ULCERS  *URINARY PROBLEMS  *BOWEL PROBLEMS  UNUSUAL RASH Items with * indicate a potential emergency and should be followed up as soon as possible.  One of the nurses will contact you 24 hours after your treatment. Please let the nurse know about any problems that you may have experienced. Feel free to call the clinic you have any questions or concerns. The clinic phone number is [phone].   I have been informed and understand all the instructions given to me. I know to contact the clinic, my physician, or go to the Emergency Department if any problems should occur. I do not have any questions at this time, but understand that I may call the clinic during office hours   should I have any questions or need assistance in obtaining follow up care.    __________________________________________  _____________  __________ Signature of Patient or Authorized Representative            Date                   Time    __________________________________________ Nurse's Signature

## 2012-06-24 ENCOUNTER — Ambulatory Visit (HOSPITAL_BASED_OUTPATIENT_CLINIC_OR_DEPARTMENT_OTHER): Payer: BC Managed Care – PPO

## 2012-06-24 VITALS — BP 137/84 | HR 82 | Temp 97.3°F

## 2012-06-24 DIAGNOSIS — C184 Malignant neoplasm of transverse colon: Secondary | ICD-10-CM

## 2012-06-24 DIAGNOSIS — C189 Malignant neoplasm of colon, unspecified: Secondary | ICD-10-CM

## 2012-06-24 DIAGNOSIS — C787 Secondary malignant neoplasm of liver and intrahepatic bile duct: Secondary | ICD-10-CM

## 2012-06-24 MED ORDER — SODIUM CHLORIDE 0.9 % IJ SOLN
10.0000 mL | INTRAMUSCULAR | Status: DC | PRN
  Administered 2012-06-24: 10 mL
  Filled 2012-06-24: qty 10

## 2012-06-24 MED ORDER — HEPARIN SOD (PORK) LOCK FLUSH 100 UNIT/ML IV SOLN
500.0000 [IU] | Freq: Once | INTRAVENOUS | Status: AC | PRN
  Administered 2012-06-24: 500 [IU]
  Filled 2012-06-24: qty 5

## 2012-06-24 NOTE — Patient Instructions (Signed)
Call MD for any problems 

## 2012-06-25 ENCOUNTER — Other Ambulatory Visit: Payer: Self-pay | Admitting: *Deleted

## 2012-06-25 DIAGNOSIS — C787 Secondary malignant neoplasm of liver and intrahepatic bile duct: Secondary | ICD-10-CM

## 2012-06-25 MED ORDER — TRAMADOL HCL 50 MG PO TABS: 50.0000 mg | ORAL_TABLET | Freq: Four times a day (QID) | ORAL | Status: DC | PRN

## 2012-06-30 ENCOUNTER — Encounter: Payer: Self-pay | Admitting: *Deleted

## 2012-06-30 DIAGNOSIS — C189 Malignant neoplasm of colon, unspecified: Secondary | ICD-10-CM

## 2012-07-05 ENCOUNTER — Other Ambulatory Visit: Payer: Self-pay | Admitting: Oncology

## 2012-07-07 ENCOUNTER — Telehealth: Payer: Self-pay | Admitting: Oncology

## 2012-07-07 ENCOUNTER — Ambulatory Visit (HOSPITAL_BASED_OUTPATIENT_CLINIC_OR_DEPARTMENT_OTHER): Payer: BC Managed Care – PPO | Admitting: Oncology

## 2012-07-07 ENCOUNTER — Encounter: Payer: Self-pay | Admitting: *Deleted

## 2012-07-07 ENCOUNTER — Other Ambulatory Visit: Payer: Self-pay | Admitting: Nurse Practitioner

## 2012-07-07 ENCOUNTER — Ambulatory Visit (HOSPITAL_BASED_OUTPATIENT_CLINIC_OR_DEPARTMENT_OTHER): Payer: BC Managed Care – PPO

## 2012-07-07 ENCOUNTER — Other Ambulatory Visit (HOSPITAL_BASED_OUTPATIENT_CLINIC_OR_DEPARTMENT_OTHER): Payer: BC Managed Care – PPO | Admitting: Lab

## 2012-07-07 VITALS — BP 116/77 | HR 73 | Temp 97.0°F | Resp 20 | Ht 68.0 in | Wt 152.1 lb

## 2012-07-07 DIAGNOSIS — Z5111 Encounter for antineoplastic chemotherapy: Secondary | ICD-10-CM

## 2012-07-07 DIAGNOSIS — C184 Malignant neoplasm of transverse colon: Secondary | ICD-10-CM

## 2012-07-07 DIAGNOSIS — C787 Secondary malignant neoplasm of liver and intrahepatic bile duct: Secondary | ICD-10-CM

## 2012-07-07 DIAGNOSIS — D6959 Other secondary thrombocytopenia: Secondary | ICD-10-CM

## 2012-07-07 DIAGNOSIS — C189 Malignant neoplasm of colon, unspecified: Secondary | ICD-10-CM

## 2012-07-07 DIAGNOSIS — M25519 Pain in unspecified shoulder: Secondary | ICD-10-CM

## 2012-07-07 LAB — CBC WITH DIFFERENTIAL/PLATELET
BASO%: 0.8 % (ref 0.0–2.0)
EOS%: 4.5 % (ref 0.0–7.0)
HCT: 45.3 % (ref 34.8–46.6)
LYMPH%: 15.6 % (ref 14.0–49.7)
MCH: 29.1 pg (ref 25.1–34.0)
MCHC: 30.9 g/dL — ABNORMAL LOW (ref 31.5–36.0)
MONO#: 0.5 10*3/uL (ref 0.1–0.9)
NEUT%: 66.9 % (ref 38.4–76.8)
Platelets: 70 10*3/uL — ABNORMAL LOW (ref 145–400)
RBC: 4.81 10*6/uL (ref 3.70–5.45)
WBC: 3.8 10*3/uL — ABNORMAL LOW (ref 3.9–10.3)
lymph#: 0.6 10*3/uL — ABNORMAL LOW (ref 0.9–3.3)

## 2012-07-07 LAB — URINALYSIS, MICROSCOPIC - CHCC
Blood: NEGATIVE
Leukocyte Esterase: NEGATIVE
Nitrite: NEGATIVE
Protein: NEGATIVE mg/dL
Specific Gravity, Urine: 1.03 (ref 1.003–1.035)
pH: 5 (ref 4.6–8.0)

## 2012-07-07 LAB — COMPREHENSIVE METABOLIC PANEL (CC13)
ALT: 46 U/L (ref 0–55)
AST: 42 U/L — ABNORMAL HIGH (ref 5–34)
Creatinine: 0.9 mg/dL (ref 0.6–1.1)
Sodium: 139 mEq/L (ref 136–145)
Total Bilirubin: 1.1 mg/dL (ref 0.20–1.20)
Total Protein: 6.9 g/dL (ref 6.4–8.3)

## 2012-07-07 MED ORDER — SODIUM CHLORIDE 0.9 % IV SOLN: Freq: Once | INTRAVENOUS | Status: DC

## 2012-07-07 MED ORDER — SODIUM CHLORIDE 0.9 % IV SOLN
1360.0000 mg/m2 | INTRAVENOUS | Status: DC
  Administered 2012-07-07: 2500 mg via INTRAVENOUS
  Filled 2012-07-07: qty 50

## 2012-07-07 MED ORDER — FLUOROURACIL CHEMO INJECTION 500 MG/10ML
230.0000 mg/m2 | Freq: Once | INTRAVENOUS | Status: AC
  Administered 2012-07-07: 400 mg via INTRAVENOUS
  Filled 2012-07-07: qty 8

## 2012-07-07 MED ORDER — SODIUM CHLORIDE 0.9 % IV SOLN
Freq: Once | INTRAVENOUS | Status: AC
  Administered 2012-07-07: 12:00:00 via INTRAVENOUS

## 2012-07-07 MED ORDER — LEUCOVORIN CALCIUM INJECTION 350 MG
400.0000 mg/m2 | Freq: Once | INTRAVENOUS | Status: AC
  Administered 2012-07-07: 728 mg via INTRAVENOUS
  Filled 2012-07-07: qty 36.4

## 2012-07-07 MED ORDER — HEPARIN SOD (PORK) LOCK FLUSH 100 UNIT/ML IV SOLN
500.0000 [IU] | Freq: Once | INTRAVENOUS | Status: DC | PRN
  Filled 2012-07-07: qty 5

## 2012-07-07 MED ORDER — SODIUM CHLORIDE 0.9 % IJ SOLN
10.0000 mL | INTRAMUSCULAR | Status: DC | PRN
  Filled 2012-07-07: qty 10

## 2012-07-07 NOTE — Progress Notes (Signed)
[facility]    OFFICE PROGRESS NOTE   INTERVAL HISTORY:   She returns as scheduled. She continues to have discomfort in the right shoulder. There is also discomfort at the right hip and right ankle areas. She takes tramadol in the evening for relief of pain. The pain is worse with posterior extension at the shoulder joint. She has started physical therapy program.  She completed another cycle of 5-fluorouracil chemotherapy on 06/22/2012. No mouth sores, nausea, or diarrhea following chemotherapy. No fever. She continues to have discomfort at the left antecubital fossa IV puncture site.  Objective:  Vital signs in last 24 hours:  Blood pressure 116/77, pulse 73, temperature 97 F (36.1 C), temperature source Oral, resp. rate 20, height 5\' 8"  (1.727 m), weight 152 lb 1.6 oz (68.992 kg).    HEENT: No thrush or ulcer Resp: Lungs clear bilaterally Cardio: Regular rate and rhythm GI: No hepatomegaly nontender Vascular: No leg edema Musculoskeletal: Full range of motion at the right shoulder without pain. Tenderness at the medial aspect of the right humeral head. No palpable mass.  Examination of the left antecubital fossa and forearm is unremarkable  Portacath/PICC-without erythema  Lab Results:  Lab Results  Component Value Date   WBC 3.8* 07/07/2012   HGB 14.0 07/07/2012   HCT 45.3 07/07/2012   MCV 94.2 07/07/2012   PLT 70* 07/07/2012   ANC 2.5    Medications: I have reviewed the patient's current medications.  Assessment/Plan: 1.Metastatic colon cancer with multiple liver metastases noted on an MRI of the abdomen 12/02/2010 and on a CT 12/13/2010. A transverse colon mass was noted on the CT from 02/11/2011 and confirmed on a colonoscopy 12/17/2010. She began treatment with FOLFIRI/Avastin 12/31/2010. A restaging CT 06/20/2011 confirmed further improvement in the metastatic liver lesions and no evidence for progressive metastatic disease. A restaging CT 08/01/2011  showed stable liver lesions and no new lesions. Restaging CT 09/12/2011 showed stable liver lesions and no new lesions. A restaging CT 10/27/2011 showed stable liver lesions and no new lesions. Restaging CT 12/05/2011 showed mild decrease in size of hepatic metastases and no evidence of disease progression. Restaging CT 01/26/2012 showed stable hepatic metastases and no new lesions. Restaging CT may 5/8/ 2013 revealed a decreased size of hepatic metastases and no new lesions. Restaging CT 04/23/2012 revealed no change in the measurable "target" lesions, a slight increase in the size of other lesions, and no new lesions. Restaging CT 06/04/2012 showed multiple hepatic metastases were grossly unchanged.  2. Abdominal pain secondary to hepatomegaly/liver metastases-resolved.  3. Anorexia, early satiety, and weight loss secondary to metastatic colon cancer-resolved.  4. Microcytic anemia. Resolved.  5. History of migraine headaches.  6. Status post uterine fibroid surgery.  7. Port-A-Cath placement 12/20/2010.  8. Delayed nausea following cycle 1 of FOLFIRI/Avastin. Improved with the addition of Aloxi and prophylactic Decadron beginning with cycle #2. The prophylactic Decadron was tapered. We increased the day 1 Decadron back to 20 mg.  9. Neutropenia secondary to chemotherapy. Cycle #4 was held on 02/11/2011. She subsequently received Neulasta support.  10. Intermittent fever with no apparent source for infection, likely "tumor fever" or fever related to chemotherapy. No recent fever.  11. Mucositis secondary to 5-FU, initially grade 2. Irinotecan and 5-FU were dose reduced per study guidelines.  12. History of abdominal cramping. ? Related to irinotecan, 5-FU, or potentially the colon tumor. No recent abdominal cramping.  13. Thrombocytopenia secondary to chemotherapy. Treatment has been held for thrombocytopenia  per study guidelines. The thrombocytopenia may be related to Avastin, or chronic liver disease  from chemotherapy. The CT on 04/23/2012 confirmed mild splenomegaly. The CT on 06/04/2012 again showed splenomegaly. The thrombocytopenia persists despite being off of irinotecan and Avastin for several months.  14. Acne-like rash on the face. Question related to steroids. Resolved.  15. Arthralgias-most likely unrelated to the colon cancer diagnosis and chemotherapy. She has been evaluated by orthopedics. The etiology of the arthralgias is unclear. Persistent discomfort at the right shoulder. 16. Discomfort at the left antecubital region following an IV stick for a CT scan-no clinical evidence of thrombophlebitis    Disposition:  Her overall status is unchanged. The plan is to continue every 2 week 5 fluorouracil chemotherapy. The thrombocytopenia is unchanged.  She has more consistent discomfort in the right shoulder. 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  She will return for an office visit after the restaging CT in 2 weeks.              Thornton Papas, MD  07/07/2012  2:56 PM

## 2012-07-07 NOTE — Progress Notes (Signed)
07/07/12 at 9:56am - [name] - cycle 35, day 1 study notes- The pt was into the cancer center this am for her labs, physical exam, and treatment. The pt states she is feeling fine and is ready for her treatment today.  ECOG=0  The pt was informed that her platelets are low at 70,000.  Dr. Truett Perna reviewed the pt's cbc/diff and stated that he wanted to proceed with her treatment today. Per discussion with Dr. Truett Perna, he stated that all of her abnormal lab values are "not clinically significant" except for the low platelets which is "clinically significant".  The medical monitor was notified by email and by phone regarding the situation.  Will await confirmation from the Medical Monitor before the pt is treated today with her cycle 35.    07/07/12 at 10:22am - Narda Bonds, research nurse, was notified by the Medical Monitor that the pt can proceed with her treatment today.  Terri, chemo infusion nurse, was given the sign regarding the pt's study drug administrations.  The pt is aware of her future appointments.

## 2012-07-07 NOTE — Progress Notes (Signed)
OK to treat per Corrie Dandy, Radio producer.  Platelets 70.

## 2012-07-07 NOTE — Patient Instructions (Addendum)
[facility] Discharge Instructions for Patients Receiving Chemotherapy  Today you received the following chemotherapy agents: Leucovorin, 5FU.  To help prevent nausea and vomiting after your treatment, we encourage you to take your nausea medication.   Take it as often as prescribed.     If you develop nausea and vomiting that is not controlled by your nausea medication, call the clinic. If it is after clinic hours your family physician or the after hours number for the clinic or go to the Emergency Department.   BELOW ARE SYMPTOMS THAT SHOULD BE REPORTED IMMEDIATELY:  *FEVER GREATER THAN 100.5 F  *CHILLS WITH OR WITHOUT FEVER  NAUSEA AND VOMITING THAT IS NOT CONTROLLED WITH YOUR NAUSEA MEDICATION  *UNUSUAL SHORTNESS OF BREATH  *UNUSUAL BRUISING OR BLEEDING  TENDERNESS IN MOUTH AND THROAT WITH OR WITHOUT PRESENCE OF ULCERS  *URINARY PROBLEMS  *BOWEL PROBLEMS  UNUSUAL RASH Items with * indicate a potential emergency and should be followed up as soon as possible.  Feel free to call the clinic you have any questions or concerns. The clinic phone number is [phone].   I have been informed and understand all the instructions given to me. I know to contact the clinic, my physician, or go to the Emergency Department if any problems should occur. I do not have any questions at this time, but understand that I may call the clinic during office hours   should I have any questions or need assistance in obtaining follow up care.    __________________________________________  _____________  __________ Signature of Patient or Authorized Representative            Date                   Time    __________________________________________ Nurse's Signature

## 2012-07-07 NOTE — Telephone Encounter (Signed)
Called and new order added to 9/13 appt   aom

## 2012-07-08 ENCOUNTER — Other Ambulatory Visit: Payer: Self-pay | Admitting: *Deleted

## 2012-07-08 DIAGNOSIS — C189 Malignant neoplasm of colon, unspecified: Secondary | ICD-10-CM

## 2012-07-08 MED ORDER — LORAZEPAM 0.5 MG PO TABS: 0.5000 mg | ORAL_TABLET | Freq: Three times a day (TID) | ORAL | Status: DC | PRN

## 2012-07-09 ENCOUNTER — Ambulatory Visit (HOSPITAL_BASED_OUTPATIENT_CLINIC_OR_DEPARTMENT_OTHER): Payer: BC Managed Care – PPO

## 2012-07-09 VITALS — BP 124/88 | HR 75 | Temp 97.6°F | Resp 20

## 2012-07-09 DIAGNOSIS — C189 Malignant neoplasm of colon, unspecified: Secondary | ICD-10-CM

## 2012-07-09 DIAGNOSIS — C184 Malignant neoplasm of transverse colon: Secondary | ICD-10-CM

## 2012-07-09 DIAGNOSIS — C787 Secondary malignant neoplasm of liver and intrahepatic bile duct: Secondary | ICD-10-CM

## 2012-07-09 DIAGNOSIS — Z452 Encounter for adjustment and management of vascular access device: Secondary | ICD-10-CM

## 2012-07-09 MED ORDER — HEPARIN SOD (PORK) LOCK FLUSH 100 UNIT/ML IV SOLN
500.0000 [IU] | Freq: Once | INTRAVENOUS | Status: AC | PRN
  Administered 2012-07-09: 500 [IU]
  Filled 2012-07-09: qty 5

## 2012-07-09 MED ORDER — SODIUM CHLORIDE 0.9 % IJ SOLN
10.0000 mL | INTRAMUSCULAR | Status: DC | PRN
  Administered 2012-07-09: 10 mL
  Filled 2012-07-09: qty 10

## 2012-07-16 ENCOUNTER — Other Ambulatory Visit (HOSPITAL_COMMUNITY): Payer: BC Managed Care – PPO

## 2012-07-17 ENCOUNTER — Other Ambulatory Visit: Payer: Self-pay | Admitting: Oncology

## 2012-07-19 ENCOUNTER — Ambulatory Visit (HOSPITAL_COMMUNITY)
Admission: RE | Admit: 2012-07-19 | Discharge: 2012-07-19 | Disposition: A | Discharge status: Home / Self Care | Payer: BC Managed Care – PPO | Source: Ambulatory Visit | Attending: Oncology | Admitting: Oncology

## 2012-07-19 DIAGNOSIS — C787 Secondary malignant neoplasm of liver and intrahepatic bile duct: Secondary | ICD-10-CM | POA: Insufficient documentation

## 2012-07-19 DIAGNOSIS — C189 Malignant neoplasm of colon, unspecified: Secondary | ICD-10-CM

## 2012-07-19 DIAGNOSIS — R161 Splenomegaly, not elsewhere classified: Secondary | ICD-10-CM | POA: Insufficient documentation

## 2012-07-19 DIAGNOSIS — M25519 Pain in unspecified shoulder: Secondary | ICD-10-CM | POA: Insufficient documentation

## 2012-07-19 MED ORDER — IOHEXOL 300 MG/ML  SOLN
100.0000 mL | Freq: Once | INTRAMUSCULAR | Status: AC | PRN
  Administered 2012-07-19: 100 mL via INTRAVENOUS

## 2012-07-20 ENCOUNTER — Encounter: Payer: Self-pay | Admitting: *Deleted

## 2012-07-20 ENCOUNTER — Ambulatory Visit (HOSPITAL_BASED_OUTPATIENT_CLINIC_OR_DEPARTMENT_OTHER): Payer: BC Managed Care – PPO | Admitting: Oncology

## 2012-07-20 ENCOUNTER — Telehealth: Payer: Self-pay | Admitting: Oncology

## 2012-07-20 ENCOUNTER — Ambulatory Visit (HOSPITAL_BASED_OUTPATIENT_CLINIC_OR_DEPARTMENT_OTHER): Payer: BC Managed Care – PPO

## 2012-07-20 ENCOUNTER — Other Ambulatory Visit (HOSPITAL_BASED_OUTPATIENT_CLINIC_OR_DEPARTMENT_OTHER): Payer: BC Managed Care – PPO

## 2012-07-20 VITALS — BP 107/73 | HR 81 | Temp 97.7°F | Resp 20 | Ht 68.0 in | Wt 151.9 lb

## 2012-07-20 DIAGNOSIS — C787 Secondary malignant neoplasm of liver and intrahepatic bile duct: Secondary | ICD-10-CM

## 2012-07-20 DIAGNOSIS — C189 Malignant neoplasm of colon, unspecified: Secondary | ICD-10-CM

## 2012-07-20 DIAGNOSIS — M25519 Pain in unspecified shoulder: Secondary | ICD-10-CM

## 2012-07-20 DIAGNOSIS — C184 Malignant neoplasm of transverse colon: Secondary | ICD-10-CM

## 2012-07-20 DIAGNOSIS — Z5111 Encounter for antineoplastic chemotherapy: Secondary | ICD-10-CM

## 2012-07-20 LAB — URINALYSIS, MICROSCOPIC - CHCC
Bilirubin (Urine): NEGATIVE
Blood: NEGATIVE
Glucose: NEGATIVE g/dL
Specific Gravity, Urine: 1.01 (ref 1.003–1.035)

## 2012-07-20 LAB — CBC WITH DIFFERENTIAL/PLATELET
BASO%: 0.6 % (ref 0.0–2.0)
Eosinophils Absolute: 0.1 10*3/uL (ref 0.0–0.5)
HCT: 39.8 % (ref 34.8–46.6)
LYMPH%: 12.4 % — ABNORMAL LOW (ref 14.0–49.7)
MCHC: 34.7 g/dL (ref 31.5–36.0)
MCV: 87.6 fL (ref 79.5–101.0)
MONO#: 0.5 10*3/uL (ref 0.1–0.9)
NEUT%: 73.3 % (ref 38.4–76.8)
Platelets: 82 10*3/uL — ABNORMAL LOW (ref 145–400)
WBC: 5 10*3/uL (ref 3.9–10.3)

## 2012-07-20 LAB — COMPREHENSIVE METABOLIC PANEL (CC13)
CO2: 28 mEq/L (ref 22–29)
Creatinine: 0.9 mg/dL (ref 0.6–1.1)
Glucose: 98 mg/dl (ref 70–99)
Total Bilirubin: 1.2 mg/dL (ref 0.20–1.20)

## 2012-07-20 MED ORDER — SODIUM CHLORIDE 0.9 % IV SOLN
1360.0000 mg/m2 | INTRAVENOUS | Status: DC
  Administered 2012-07-20: 2500 mg via INTRAVENOUS
  Filled 2012-07-20: qty 50

## 2012-07-20 MED ORDER — SODIUM CHLORIDE 0.9 % IV SOLN: Freq: Once | INTRAVENOUS | Status: DC

## 2012-07-20 MED ORDER — LEUCOVORIN CALCIUM INJECTION 350 MG
400.0000 mg/m2 | Freq: Once | INTRAVENOUS | Status: AC
  Administered 2012-07-20: 728 mg via INTRAVENOUS
  Filled 2012-07-20: qty 36.4

## 2012-07-20 MED ORDER — FLUOROURACIL CHEMO INJECTION 500 MG/10ML
230.0000 mg/m2 | Freq: Once | INTRAVENOUS | Status: AC
  Administered 2012-07-20: 400 mg via INTRAVENOUS
  Filled 2012-07-20: qty 8

## 2012-07-20 MED ORDER — SODIUM CHLORIDE 0.9 % IV SOLN
Freq: Once | INTRAVENOUS | Status: AC
  Administered 2012-07-20: 12:00:00 via INTRAVENOUS

## 2012-07-20 NOTE — Patient Instructions (Addendum)
[facility] Discharge Instructions for Patients Receiving Chemotherapy  Today you received the following chemotherapy agents Leucovorin and Adrucil.  To help prevent nausea and vomiting after your treatment, we encourage you to take your nausea medication. Begin taking your nausea medication as often as prescribed for by Dr. Truett Perna.    If you develop nausea and vomiting that is not controlled by your nausea medication, call the clinic. If it is after clinic hours your family physician or the after hours number for the clinic or go to the Emergency Department.   BELOW ARE SYMPTOMS THAT SHOULD BE REPORTED IMMEDIATELY:  *FEVER GREATER THAN 100.5 F  *CHILLS WITH OR WITHOUT FEVER  NAUSEA AND VOMITING THAT IS NOT CONTROLLED WITH YOUR NAUSEA MEDICATION  *UNUSUAL SHORTNESS OF BREATH  *UNUSUAL BRUISING OR BLEEDING  TENDERNESS IN MOUTH AND THROAT WITH OR WITHOUT PRESENCE OF ULCERS  *URINARY PROBLEMS  *BOWEL PROBLEMS  UNUSUAL RASH Items with * indicate a potential emergency and should be followed up as soon as possible.  One of the nurses will contact you 24 hours after your treatment. Please let the nurse know about any problems that you may have experienced. Feel free to call the clinic you have any questions or concerns. The clinic phone number is [phone].   I have been informed and understand all the instructions given to me. I know to contact the clinic, my physician, or go to the Emergency Department if any problems should occur. I do not have any questions at this time, but understand that I may call the clinic during office hours   should I have any questions or need assistance in obtaining follow up care.    __________________________________________  _____________  __________ Signature of Patient or Authorized Representative            Date                   Time    __________________________________________ Nurse's Signature

## 2012-07-20 NOTE — Progress Notes (Signed)
Sept. 17, 2013, Cycle 36, [name] clinical trial Natalie Mccarthy is in the cancer center today for lab work, physical exam, and treatment. Her platelet count is 82,000 today. Dr. Myrle Sheng has examined Natalie Mccarthy and wants to proceed with the same treatment as last cycle, 5FU and leucovorin ant dose level -3. This was communicated to the study medical monitor by a Request for Medical Review form and telephone conversation. Per the medical monitor, may proceed on study.  She denies any adverse events except for the continuing joint pain in her right shoulder and occasionally in her right hip.   All labs reviewed by Dr. Truett Perna. Abnormal results are not clinically significant except for the platelet count.

## 2012-07-20 NOTE — Patient Instructions (Signed)
[facility] Discharge Instructions  Your exam findings, labs and results were discussed with your MD today.   Please visit scheduling to obtain calendar for future appointments.  Please call the [facility] at [phone] during business hours should you have any further questions or need assistance in obtaining follow-up care. If you have a medical emergency, please dial 911.  Special Instructions:         

## 2012-07-20 NOTE — Progress Notes (Signed)
[facility]    OFFICE PROGRESS NOTE   INTERVAL HISTORY:   She returns as scheduled. She completed another cycle of 5-fluorouracil on 07/07/2012. She tolerated the chemotherapy well. No abdominal pain or diarrhea.  She continues to complain of pain at the right shoulder. Physical therapy has not helped. She is not taking pain medication.   Objective:  Vital signs in last 24 hours:  Blood pressure 107/73, pulse 81, temperature 97.7 F (36.5 C), temperature source Oral, resp. rate 20, height 5\' 8"  (1.727 m), weight 151 lb 14.4 oz (68.901 kg).    HEENT: No thrush or ulcers Resp: Lungs clear bilaterally Cardio: Regular rate and rhythm GI: No hepatomegaly, nontender, no mass Vascular: No leg edema Musculoskeletal: There is pain with posterior flexion and abduction at the right shoulder. No mass at the upper arm. No tenderness over the arm.    Portacath/PICC-without erythema  Lab Results:  Lab Results  Component Value Date   WBC 5.0 07/20/2012   HGB 13.8 07/20/2012   HCT 39.8 07/20/2012   MCV 87.6 07/20/2012   PLT 82* 07/20/2012   ANC 3.7 X-rays: CT the abdomen on 07/19/2012-the target lesions appear slightly smaller, an ill-defined lesion in segment 7 appears slightly larger than on the prior exam. No definite new hepatic lesions. Splenomegaly is stable. A CT of the right upper arm reveals no soft tissue mass lesion or osteolytic lesion of the humerus. The scapula. Normal. A.c. joint appeared normal. No rotator cuff muscular atrophy. No axillary adenopathy.   Medications: I have reviewed the patient's current medications.  Assessment/Plan: 1.Metastatic colon cancer with multiple liver metastases noted on an MRI of the abdomen 12/02/2010 and on a CT 12/13/2010. A transverse colon mass was noted on the CT from 02/11/2011 and confirmed on a colonoscopy 12/17/2010. She began treatment with FOLFIRI/Avastin 12/31/2010. A restaging CT 06/20/2011 confirmed further  improvement in the metastatic liver lesions and no evidence for progressive metastatic disease. A restaging CT 08/01/2011 showed stable liver lesions and no new lesions. Restaging CT 09/12/2011 showed stable liver lesions and no new lesions. A restaging CT 10/27/2011 showed stable liver lesions and no new lesions. Restaging CT 12/05/2011 showed mild decrease in size of hepatic metastases and no evidence of disease progression. Restaging CT 01/26/2012 showed stable hepatic metastases and no new lesions. Restaging CT may 5/8/ 2013 revealed a decreased size of hepatic metastases and no new lesions. Restaging CT 04/23/2012 revealed no change in the measurable "target" lesions, a slight increase in the size of other lesions, and no new lesions. Restaging CT 06/04/2012 showed multiple hepatic metastases were grossly unchanged. Restaging CT 07/19/2012 with stable disease in the liver. 2. Abdominal pain secondary to hepatomegaly/liver metastases-resolved.  3. Anorexia, early satiety, and weight loss secondary to metastatic colon cancer-resolved.  4. Microcytic anemia. Resolved.  5. History of migraine headaches.  6. Status post uterine fibroid surgery.  7. Port-A-Cath placement 12/20/2010.  8. Delayed nausea following cycle 1 of FOLFIRI/Avastin. Improved with the addition of Aloxi and prophylactic Decadron beginning with cycle #2. The prophylactic Decadron was tapered. We increased the day 1 Decadron back to 20 mg.  9. Neutropenia secondary to chemotherapy. Cycle #4 was held on 02/11/2011. She subsequently received Neulasta support.  10. Intermittent fever with no apparent source for infection, likely "tumor fever" or fever related to chemotherapy. No recent fever.  11. Mucositis secondary to 5-FU, initially grade 2. Irinotecan and 5-FU were dose reduced per study guidelines.  12. History of abdominal  cramping. ? Related to irinotecan, 5-FU, or potentially the colon tumor. No recent abdominal cramping.  13.  Thrombocytopenia secondary to chemotherapy. Treatment has been held for thrombocytopenia per study guidelines. The thrombocytopenia may be related to Avastin, or chronic liver disease from chemotherapy. The CT on 04/23/2012 confirmed mild splenomegaly. The CT on 06/04/2012 again showed splenomegaly. The thrombocytopenia persists despite being off of irinotecan and Avastin for several months. Partially improved today. 14. Acne-like rash on the face. Question related to steroids. Resolved.  15. Arthralgias-most likely unrelated to the colon cancer diagnosis and chemotherapy. She has been evaluated by orthopedics. The etiology of the arthralgias is unclear. Persistent discomfort at the right shoulder. Negative CT of the right upper extremity on 07/19/2012. 16. Discomfort at the left antecubital region following an IV stick for a CT scan-no clinical evidence of thrombophlebitis   Disposition:   She appears unchanged. The restaging CT reveals stable disease in the liver. The plan is to continue infusional 5-fluorouracil chemotherapy on a 2 week schedule. She will return for an office visit and chemotherapy in 2 weeks.  The etiology of the right shoulder discomfort remains unclear. No evidence of metastatic disease to the right upper extremity was noted on a CT 07/19/2012. We will consider an MRI of the shoulder if the discomfort persists. She plans to continue followup with physical therapy and orthopedics.    Thornton Papas, MD  07/20/2012  6:21 PM

## 2012-07-20 NOTE — Telephone Encounter (Signed)
appts made and printed for pt  °

## 2012-07-22 ENCOUNTER — Ambulatory Visit (HOSPITAL_BASED_OUTPATIENT_CLINIC_OR_DEPARTMENT_OTHER): Payer: BC Managed Care – PPO

## 2012-07-22 VITALS — BP 115/76 | HR 79 | Temp 97.7°F | Resp 18

## 2012-07-22 DIAGNOSIS — Z452 Encounter for adjustment and management of vascular access device: Secondary | ICD-10-CM

## 2012-07-22 DIAGNOSIS — C787 Secondary malignant neoplasm of liver and intrahepatic bile duct: Secondary | ICD-10-CM

## 2012-07-22 DIAGNOSIS — C184 Malignant neoplasm of transverse colon: Secondary | ICD-10-CM

## 2012-07-22 DIAGNOSIS — C189 Malignant neoplasm of colon, unspecified: Secondary | ICD-10-CM

## 2012-07-22 MED ORDER — HEPARIN SOD (PORK) LOCK FLUSH 100 UNIT/ML IV SOLN
500.0000 [IU] | Freq: Once | INTRAVENOUS | Status: AC | PRN
  Administered 2012-07-22: 500 [IU]
  Filled 2012-07-22: qty 5

## 2012-07-22 MED ORDER — SODIUM CHLORIDE 0.9 % IJ SOLN
10.0000 mL | INTRAMUSCULAR | Status: DC | PRN
  Administered 2012-07-22: 10 mL
  Filled 2012-07-22: qty 10

## 2012-07-22 NOTE — Patient Instructions (Signed)
Call MD for problems 

## 2012-08-01 ENCOUNTER — Other Ambulatory Visit: Payer: Self-pay | Admitting: Oncology

## 2012-08-02 ENCOUNTER — Other Ambulatory Visit: Payer: Self-pay | Admitting: Nurse Practitioner

## 2012-08-02 ENCOUNTER — Ambulatory Visit (HOSPITAL_BASED_OUTPATIENT_CLINIC_OR_DEPARTMENT_OTHER): Payer: BC Managed Care – PPO | Admitting: Nurse Practitioner

## 2012-08-02 ENCOUNTER — Telehealth: Payer: Self-pay | Admitting: Oncology

## 2012-08-02 ENCOUNTER — Other Ambulatory Visit (HOSPITAL_BASED_OUTPATIENT_CLINIC_OR_DEPARTMENT_OTHER): Payer: BC Managed Care – PPO | Admitting: Lab

## 2012-08-02 ENCOUNTER — Encounter: Payer: Self-pay | Admitting: *Deleted

## 2012-08-02 VITALS — BP 129/86 | HR 83 | Temp 96.9°F | Resp 18 | Ht 68.0 in | Wt 154.1 lb

## 2012-08-02 DIAGNOSIS — C189 Malignant neoplasm of colon, unspecified: Secondary | ICD-10-CM

## 2012-08-02 DIAGNOSIS — C184 Malignant neoplasm of transverse colon: Secondary | ICD-10-CM

## 2012-08-02 DIAGNOSIS — M25519 Pain in unspecified shoulder: Secondary | ICD-10-CM

## 2012-08-02 DIAGNOSIS — M25511 Pain in right shoulder: Secondary | ICD-10-CM

## 2012-08-02 DIAGNOSIS — C787 Secondary malignant neoplasm of liver and intrahepatic bile duct: Secondary | ICD-10-CM

## 2012-08-02 DIAGNOSIS — Z5111 Encounter for antineoplastic chemotherapy: Secondary | ICD-10-CM

## 2012-08-02 LAB — COMPREHENSIVE METABOLIC PANEL (CC13)
AST: 42 U/L — ABNORMAL HIGH (ref 5–34)
Alkaline Phosphatase: 176 U/L — ABNORMAL HIGH (ref 40–150)
BUN: 16 mg/dL (ref 7.0–26.0)
Creatinine: 0.9 mg/dL (ref 0.6–1.1)
Total Bilirubin: 0.9 mg/dL (ref 0.20–1.20)

## 2012-08-02 LAB — URINALYSIS, MICROSCOPIC - CHCC
Ketones: NEGATIVE mg/dL
Protein: NEGATIVE mg/dL
Specific Gravity, Urine: 1.03 (ref 1.003–1.035)
pH: 5 (ref 4.6–8.0)

## 2012-08-02 LAB — CBC WITH DIFFERENTIAL/PLATELET
Basophils Absolute: 0 10*3/uL (ref 0.0–0.1)
EOS%: 3.9 % (ref 0.0–7.0)
HCT: 38.8 % (ref 34.8–46.6)
HGB: 13.7 g/dL (ref 11.6–15.9)
MCH: 31.2 pg (ref 25.1–34.0)
MCV: 88.4 fL (ref 79.5–101.0)
MONO%: 11.3 % (ref 0.0–14.0)
NEUT%: 69.6 % (ref 38.4–76.8)
RDW: 18.3 % — ABNORMAL HIGH (ref 11.2–14.5)

## 2012-08-02 MED ORDER — INFLUENZA VIRUS VACC SPLIT PF IM SUSP
0.5000 mL | Freq: Once | INTRAMUSCULAR | Status: DC
  Filled 2012-08-02: qty 0.5

## 2012-08-02 NOTE — Progress Notes (Signed)
OFFICE PROGRESS NOTE  Interval history:  Ms. Abdelaziz returns as scheduled. She completed the most recent cycle of 5-fluorouracil on 07/20/2012. She denies nausea/vomiting. No mouth sores. No diarrhea. She denies fever. No hand or foot pain or redness. She has a good appetite.  She reports stable to increased pain at the right shoulder. At present she is not taking pain medication.   Objective: Blood pressure 129/86, pulse 83, temperature 96.9 F (36.1 C), temperature source Oral, resp. rate 18, height 5\' 8"  (1.727 m), weight 154 lb 1.6 oz (69.899 kg).  Oropharynx is without thrush or ulceration. Lungs are clear. No wheezes or rales. Regular cardiac rhythm. Port-A-Cath site is without erythema. Abdomen is soft and nontender. No hepatomegaly. Extremities without edema. Calves are soft and nontender. Pain with abduction at the right shoulder.  Lab Results: Lab Results  Component Value Date   WBC 4.1 08/02/2012   HGB 13.7 08/02/2012   HCT 38.8 08/02/2012   MCV 88.4 08/02/2012   PLT 91* 08/02/2012    Chemistry:    Chemistry      Component Value Date/Time   NA 139 08/02/2012 1034   NA 139 06/22/2012 1108   NA 143 01/27/2012 1354   K 4.1 08/02/2012 1034   K 3.8 06/22/2012 1108   K 3.8 01/27/2012 1354   CL 104 08/02/2012 1034   CL 103 06/22/2012 1108   CL 97* 01/27/2012 1354   CO2 25 08/02/2012 1034   CO2 31 06/22/2012 1108   CO2 29 01/27/2012 1354   BUN 16.0 08/02/2012 1034   BUN 19 06/22/2012 1108   BUN 11 01/27/2012 1354   CREATININE 0.9 08/02/2012 1034   CREATININE 0.75 06/22/2012 1108   CREATININE 0.9 01/27/2012 1354      Component Value Date/Time   CALCIUM 9.8 08/02/2012 1034   CALCIUM 9.7 06/22/2012 1108   CALCIUM 8.9 01/27/2012 1354   ALKPHOS 176* 08/02/2012 1034   ALKPHOS 185* 06/22/2012 1108   ALKPHOS 198* 01/27/2012 1354   AST 42* 08/02/2012 1034   AST 36 06/22/2012 1108   AST 34 01/27/2012 1354   ALT 47 08/02/2012 1034   ALT 36* 06/22/2012 1108   BILITOT 0.90 08/02/2012 1034   BILITOT  0.5 06/22/2012 1108   BILITOT 0.50 01/27/2012 1354       Studies/Results: Ct Abdomen W Contrast  07/19/2012  *RADIOLOGY REPORT*  Clinical Data: Restaging scan for metastatic colon cancer.  CT ABDOMEN WITH CONTRAST  RECIST Protocol 1.1   Target Lesions:   1. Central hepatic lesion measures 4.4 cm (series 2/image 14),  previously 4.8 cm   2. Posterior left hepatic lobe lesion measures 1.4 cm ( series  2/image 15), previously 1.9 cm  Technique:  Multidetector CT imaging of the abdomen was performed following the standard protocol during bolus administration of intravenous contrast.  Contrast: OMNIPAQUE IOHEXOL 300 MG/ML  SOLN  Comparison: CT of the abdomen and pelvis 06/04/2012.  Findings:  Lung Bases: Unremarkable.  Abdomen:  Multiple hepatic lesions are again noted. RECIST lesions appear slightly smaller, as above.  A lesion in segment 8 (image 7 of series 5) appears slightly smaller, currently measuring 3.9 x 2.5 cm (previously 4.1 x 2.9 cm). Ill-defined lesion in segment 7 (image 11 of series 5) appears slightly larger than the prior examination, currently measuring approximately 2.6 x 2.0 cm (previously 2.1 x 1.7 cm).  No definite new hepatic lesions are noted.  The appearance of the gallbladder, pancreas, bilateral adrenal glands and bilateral kidneys is  unremarkable.  Splenomegaly is similar to the prior examination measuring 13.2 x 6.7 x 16.3 cm. Within the visualized abdomen there are no definite mesenteric soft tissue implants, and no definite pathologic lymphadenopathy.  No ascites or pneumoperitoneum.  Retrocecal appendix.  Musculoskeletal: There are no aggressive appearing lytic or blastic lesions noted in the visualized portions of the skeleton.  IMPRESSION: Overall burden of metastatic disease in the liver is very similar to the prior examination with RECIST measurements, as above.   Original Report Authenticated By: Florencia Reasons, M.D.    Ct Extrem Up Entire Arm R Wo/cm  07/19/2012   *RADIOLOGY REPORT*  Clinical Data: The colon cancer.  Right shoulder pain.  CT OF THE UPPER RIGHT ARM WITHOUT CONTRAST  Comparison: None.  Findings: The study was performed after IV contrast infusion for other imaging.  There are no soft tissue mass lesions. Neurovascular bundles appear within normal limits.  No osteolytic lesions of the humerus.  Scapula appears within normal limits.  AC joint appears normal.  There is no rotator cuff muscular atrophy. No axillary adenopathy.  The elbow and visualized proximal forearm appear within normal limits.  IMPRESSION: Negative CT of the right upper arm and proximal forearm.  If there is concern for rotator cuff pathology or internal derangement of the shoulder, MRI is the test of choice.   Original Report Authenticated By: Andreas Newport, M.D.     Medications: I have reviewed the patient's current medications.  Assessment/Plan:  1.Metastatic colon cancer with multiple liver metastases noted on an MRI of the abdomen 12/02/2010 and on a CT 12/13/2010. A transverse colon mass was noted on the CT from 02/11/2011 and confirmed on a colonoscopy 12/17/2010. She began treatment with FOLFIRI/Avastin 12/31/2010. A restaging CT 06/20/2011 confirmed further improvement in the metastatic liver lesions and no evidence for progressive metastatic disease. A restaging CT 08/01/2011 showed stable liver lesions and no new lesions. Restaging CT 09/12/2011 showed stable liver lesions and no new lesions. A restaging CT 10/27/2011 showed stable liver lesions and no new lesions. Restaging CT 12/05/2011 showed mild decrease in size of hepatic metastases and no evidence of disease progression. Restaging CT 01/26/2012 showed stable hepatic metastases and no new lesions. Restaging CT may 5/8/ 2013 revealed a decreased size of hepatic metastases and no new lesions. Restaging CT 04/23/2012 revealed no change in the measurable "target" lesions, a slight increase in the size of other lesions, and  no new lesions. Restaging CT 06/04/2012 showed multiple hepatic metastases were grossly unchanged. Restaging CT 07/19/2012 with stable disease in the liver. Infusional 5-fluorouracil continued on a 2 week schedule. 2. Abdominal pain secondary to hepatomegaly/liver metastases-resolved.  3. Anorexia, early satiety, and weight loss secondary to metastatic colon cancer-resolved.  4. Microcytic anemia. Resolved.  5. History of migraine headaches.  6. Status post uterine fibroid surgery.  7. Port-A-Cath placement 12/20/2010.  8. Delayed nausea following cycle 1 of FOLFIRI/Avastin. Improved with the addition of Aloxi and prophylactic Decadron beginning with cycle #2. The prophylactic Decadron was tapered. We increased the day 1 Decadron back to 20 mg.  9. Neutropenia secondary to chemotherapy. Cycle #4 was held on 02/11/2011. She subsequently received Neulasta support.  10. Intermittent fever with no apparent source for infection, likely "tumor fever" or fever related to chemotherapy. No recent fever.  11. Mucositis secondary to 5-FU, initially grade 2. Irinotecan and 5-FU were dose reduced per study guidelines.  12. History of abdominal cramping. ? Related to irinotecan, 5-FU, or potentially the colon tumor. No  recent abdominal cramping.  13. Thrombocytopenia secondary to chemotherapy. Treatment has been held for thrombocytopenia per study guidelines. The thrombocytopenia may be related to Avastin, or chronic liver disease from chemotherapy. The CT on 04/23/2012 confirmed mild splenomegaly. The CT on 06/04/2012 again showed splenomegaly. The thrombocytopenia persists despite being off of irinotecan and Avastin for several months. Improved today.  14. Acne-like rash on the face. Question related to steroids. Resolved.  15. Arthralgias-most likely unrelated to the colon cancer diagnosis and chemotherapy. She has been evaluated by orthopedics. The etiology of the arthralgias is unclear.  16. Persistent  discomfort at the right shoulder. Negative CT of the right upper extremity on 07/19/2012.  16. Discomfort at the left antecubital region following an IV stick for a CT scan-no clinical evidence of thrombophlebitis.  Disposition-Natalie Mccarthy appears stable. Plan to continue infusional 5-fluorouracil on a 2 week schedule. She will return for the next cycle on 08/03/2012. She will return for a followup visit and chemotherapy on 08/17/2012.  She has persistent pain at the right shoulder. We are referring her for an MRI.  She will contact the office prior to her next visit with any problems.  Plan reviewed with Dr. Truett Perna.    Lonna Cobb ANP/GNP-BC

## 2012-08-02 NOTE — Progress Notes (Signed)
08/02/12 @ 11:00am, [name] Cycle 37, Day -1:  [name] into the [facility] to labs and a physical assessment prior to treatment with 5FU and LV scheduled for tomorrow.  She is doing very well with some continuing mild fatigue, thrombocytopenia which has improved to 91K, and right shoulder pain (arthralgia, grade 2) that  interfers with her ADLs.  She is in very good spirits and is excited to see her platelet count increasing.  Her labs were all appropriate for treatment and there were no clinically significant abnormal values per Natalie Cobb, NP.  Dr. Truett Mccarthy would like to continue with the same treatment as last cycle.  Completed the RMRF form and e-mailed it to the appropriate site.    08/02/12 @13 :00:   Spoke with Natalie Capers, MD, one of the medical monitors for the [name] study about the RMFR that he has received and reviewed.  He said that he is agreement with treating patient at dose level -3 of 5FU/LV.  He would also ask that Dr. Truett Mccarthy consider restarting the bevacizumab soon. (See e-mail from Dr. Christin Mccarthy).

## 2012-08-02 NOTE — Telephone Encounter (Signed)
gv and printed appt for pt. °

## 2012-08-03 ENCOUNTER — Other Ambulatory Visit: Payer: Self-pay | Admitting: Nurse Practitioner

## 2012-08-03 ENCOUNTER — Ambulatory Visit (HOSPITAL_BASED_OUTPATIENT_CLINIC_OR_DEPARTMENT_OTHER): Payer: BC Managed Care – PPO

## 2012-08-03 VITALS — BP 128/81 | HR 68 | Temp 98.0°F | Resp 16

## 2012-08-03 DIAGNOSIS — C787 Secondary malignant neoplasm of liver and intrahepatic bile duct: Secondary | ICD-10-CM

## 2012-08-03 DIAGNOSIS — Z23 Encounter for immunization: Secondary | ICD-10-CM

## 2012-08-03 DIAGNOSIS — Z5111 Encounter for antineoplastic chemotherapy: Secondary | ICD-10-CM

## 2012-08-03 DIAGNOSIS — C189 Malignant neoplasm of colon, unspecified: Secondary | ICD-10-CM

## 2012-08-03 DIAGNOSIS — C184 Malignant neoplasm of transverse colon: Secondary | ICD-10-CM

## 2012-08-03 MED ORDER — LEUCOVORIN CALCIUM INJECTION 350 MG
400.0000 mg/m2 | Freq: Once | INTRAVENOUS | Status: AC
  Administered 2012-08-03: 728 mg via INTRAVENOUS
  Filled 2012-08-03: qty 36.4

## 2012-08-03 MED ORDER — SODIUM CHLORIDE 0.9 % IV SOLN
Freq: Once | INTRAVENOUS | Status: AC
  Administered 2012-08-03: 12:00:00 via INTRAVENOUS

## 2012-08-03 MED ORDER — INFLUENZA VIRUS VACC SPLIT PF IM SUSP
0.5000 mL | Freq: Once | INTRAMUSCULAR | Status: AC
  Administered 2012-08-03: 0.5 mL via INTRAMUSCULAR
  Filled 2012-08-03: qty 0.5

## 2012-08-03 MED ORDER — SODIUM CHLORIDE 0.9 % IV SOLN
1360.0000 mg/m2 | INTRAVENOUS | Status: DC
  Administered 2012-08-03: 2500 mg via INTRAVENOUS
  Filled 2012-08-03: qty 50

## 2012-08-03 MED ORDER — FLUOROURACIL CHEMO INJECTION 500 MG/10ML
230.0000 mg/m2 | Freq: Once | INTRAVENOUS | Status: AC
  Administered 2012-08-03: 400 mg via INTRAVENOUS
  Filled 2012-08-03: qty 8

## 2012-08-03 NOTE — Patient Instructions (Signed)
[facility] Discharge Instructions for Patients Receiving Chemotherapy  Today you received the following chemotherapy agents Leucovorin and 5FU.  To help prevent nausea and vomiting after your treatment, we encourage you to take your nausea medication.   If you develop nausea and vomiting that is not controlled by your nausea medication, call the clinic. If it is after clinic hours your family physician or the after hours number for the clinic or go to the Emergency Department.   BELOW ARE SYMPTOMS THAT SHOULD BE REPORTED IMMEDIATELY:  *FEVER GREATER THAN 100.5 F  *CHILLS WITH OR WITHOUT FEVER  NAUSEA AND VOMITING THAT IS NOT CONTROLLED WITH YOUR NAUSEA MEDICATION  *UNUSUAL SHORTNESS OF BREATH  *UNUSUAL BRUISING OR BLEEDING  TENDERNESS IN MOUTH AND THROAT WITH OR WITHOUT PRESENCE OF ULCERS  *URINARY PROBLEMS  *BOWEL PROBLEMS  UNUSUAL RASH Items with * indicate a potential emergency and should be followed up as soon as possible.  One of the nurses will contact you 24 hours after your treatment. Please let the nurse know about any problems that you may have experienced. Feel free to call the clinic you have any questions or concerns. The clinic phone number is [phone].   I have been informed and understand all the instructions given to me. I know to contact the clinic, my physician, or go to the Emergency Department if any problems should occur. I do not have any questions at this time, but understand that I may call the clinic during office hours   should I have any questions or need assistance in obtaining follow up care.    __________________________________________  _____________  __________ Signature of Patient or Authorized Representative            Date                   Time    __________________________________________ Nurse's Signature    

## 2012-08-03 NOTE — Progress Notes (Signed)
08/03/2012 Cycle 37 Natalie Mccarthy is in the cancer Center today to receive cycle 37 on the [name] study. Sign for infusion given to M. Gordy Levan, RN. She was also given tubes to draw research labs at port access.

## 2012-08-05 ENCOUNTER — Ambulatory Visit (HOSPITAL_BASED_OUTPATIENT_CLINIC_OR_DEPARTMENT_OTHER): Payer: BC Managed Care – PPO

## 2012-08-05 ENCOUNTER — Telehealth: Payer: Self-pay | Admitting: *Deleted

## 2012-08-05 VITALS — BP 115/70 | HR 78 | Temp 97.4°F

## 2012-08-05 DIAGNOSIS — C189 Malignant neoplasm of colon, unspecified: Secondary | ICD-10-CM

## 2012-08-05 DIAGNOSIS — Z452 Encounter for adjustment and management of vascular access device: Secondary | ICD-10-CM

## 2012-08-05 DIAGNOSIS — C184 Malignant neoplasm of transverse colon: Secondary | ICD-10-CM

## 2012-08-05 DIAGNOSIS — C787 Secondary malignant neoplasm of liver and intrahepatic bile duct: Secondary | ICD-10-CM

## 2012-08-05 MED ORDER — HEPARIN SOD (PORK) LOCK FLUSH 100 UNIT/ML IV SOLN
500.0000 [IU] | Freq: Once | INTRAVENOUS | Status: AC | PRN
  Administered 2012-08-05: 500 [IU]
  Filled 2012-08-05: qty 5

## 2012-08-05 MED ORDER — SODIUM CHLORIDE 0.9 % IJ SOLN
10.0000 mL | INTRAMUSCULAR | Status: DC | PRN
  Administered 2012-08-05: 10 mL
  Filled 2012-08-05: qty 10

## 2012-08-05 NOTE — Telephone Encounter (Signed)
Per patient voicemail, she doesn't need her port accessed tomorrow. Patient doesn't need contrast for MRI. Appt canceled.   JMW

## 2012-08-06 ENCOUNTER — Ambulatory Visit (HOSPITAL_COMMUNITY)
Admission: RE | Admit: 2012-08-06 | Discharge: 2012-08-06 | Disposition: A | Discharge status: Home / Self Care | Payer: BC Managed Care – PPO | Source: Ambulatory Visit | Attending: Nurse Practitioner | Admitting: Nurse Practitioner

## 2012-08-06 DIAGNOSIS — M25511 Pain in right shoulder: Secondary | ICD-10-CM

## 2012-08-06 DIAGNOSIS — M719 Bursopathy, unspecified: Secondary | ICD-10-CM | POA: Insufficient documentation

## 2012-08-06 DIAGNOSIS — X58XXXA Exposure to other specified factors, initial encounter: Secondary | ICD-10-CM | POA: Insufficient documentation

## 2012-08-06 DIAGNOSIS — M67919 Unspecified disorder of synovium and tendon, unspecified shoulder: Secondary | ICD-10-CM | POA: Insufficient documentation

## 2012-08-06 DIAGNOSIS — M25519 Pain in unspecified shoulder: Secondary | ICD-10-CM | POA: Insufficient documentation

## 2012-08-06 DIAGNOSIS — S46819A Strain of other muscles, fascia and tendons at shoulder and upper arm level, unspecified arm, initial encounter: Secondary | ICD-10-CM | POA: Insufficient documentation

## 2012-08-09 ENCOUNTER — Telehealth: Payer: Self-pay | Admitting: *Deleted

## 2012-08-09 NOTE — Telephone Encounter (Signed)
Patient called to request MRI results. No cancer noted per Dr. Truett Perna. Ortho issue related to rotator cuff. Will forward to Dr. Albertha Ghee at [facility] per patient request.

## 2012-08-12 ENCOUNTER — Encounter: Payer: Self-pay | Admitting: *Deleted

## 2012-08-12 ENCOUNTER — Other Ambulatory Visit: Payer: Self-pay | Admitting: *Deleted

## 2012-08-12 DIAGNOSIS — C189 Malignant neoplasm of colon, unspecified: Secondary | ICD-10-CM

## 2012-08-13 ENCOUNTER — Telehealth: Payer: Self-pay | Admitting: *Deleted

## 2012-08-13 NOTE — Telephone Encounter (Signed)
Patient called to request MRI result be faxed to Dr. Cleophas Dunker at [facility] office. Was not received by office. Left message for patient that report was routed to her In Basket on 08/09/12, but will be manually faxed to office today per her request.

## 2012-08-15 ENCOUNTER — Other Ambulatory Visit: Payer: Self-pay | Admitting: Oncology

## 2012-08-17 ENCOUNTER — Ambulatory Visit: Payer: BC Managed Care – PPO

## 2012-08-17 ENCOUNTER — Telehealth: Payer: Self-pay | Admitting: Oncology

## 2012-08-17 ENCOUNTER — Other Ambulatory Visit (HOSPITAL_BASED_OUTPATIENT_CLINIC_OR_DEPARTMENT_OTHER): Payer: BC Managed Care – PPO | Admitting: Lab

## 2012-08-17 ENCOUNTER — Telehealth: Payer: Self-pay | Admitting: *Deleted

## 2012-08-17 ENCOUNTER — Encounter: Payer: Self-pay | Admitting: *Deleted

## 2012-08-17 ENCOUNTER — Ambulatory Visit (HOSPITAL_BASED_OUTPATIENT_CLINIC_OR_DEPARTMENT_OTHER): Payer: BC Managed Care – PPO | Admitting: Oncology

## 2012-08-17 VITALS — BP 114/72 | HR 76 | Temp 96.9°F | Resp 20 | Ht 68.0 in | Wt 155.4 lb

## 2012-08-17 DIAGNOSIS — M25519 Pain in unspecified shoulder: Secondary | ICD-10-CM

## 2012-08-17 DIAGNOSIS — C189 Malignant neoplasm of colon, unspecified: Secondary | ICD-10-CM

## 2012-08-17 DIAGNOSIS — C184 Malignant neoplasm of transverse colon: Secondary | ICD-10-CM

## 2012-08-17 DIAGNOSIS — D696 Thrombocytopenia, unspecified: Secondary | ICD-10-CM

## 2012-08-17 DIAGNOSIS — C787 Secondary malignant neoplasm of liver and intrahepatic bile duct: Secondary | ICD-10-CM

## 2012-08-17 LAB — URINALYSIS, MICROSCOPIC - CHCC
Bilirubin (Urine): NEGATIVE
Blood: NEGATIVE
Glucose: NEGATIVE g/dL
Specific Gravity, Urine: 1.03 (ref 1.003–1.035)

## 2012-08-17 LAB — COMPREHENSIVE METABOLIC PANEL (CC13)
AST: 34 U/L (ref 5–34)
Albumin: 3.9 g/dL (ref 3.5–5.0)
Alkaline Phosphatase: 139 U/L (ref 40–150)
BUN: 15 mg/dL (ref 7.0–26.0)
Potassium: 4 mEq/L (ref 3.5–5.1)
Sodium: 140 mEq/L (ref 136–145)
Total Protein: 6.8 g/dL (ref 6.4–8.3)

## 2012-08-17 LAB — CBC WITH DIFFERENTIAL/PLATELET
Eosinophils Absolute: 0.1 10*3/uL (ref 0.0–0.5)
MONO#: 0.4 10*3/uL (ref 0.1–0.9)
NEUT#: 2.5 10*3/uL (ref 1.5–6.5)
Platelets: 65 10*3/uL — ABNORMAL LOW (ref 145–400)
RBC: 4.26 10*6/uL (ref 3.70–5.45)
RDW: 18.4 % — ABNORMAL HIGH (ref 11.2–14.5)
WBC: 3.7 10*3/uL — ABNORMAL LOW (ref 3.9–10.3)

## 2012-08-17 NOTE — Telephone Encounter (Signed)
Gave pt appt for October and November lab, chemo and ML

## 2012-08-17 NOTE — Progress Notes (Signed)
[facility]    OFFICE PROGRESS NOTE   INTERVAL HISTORY:   She returns as scheduled. She completed another cycle of 5-fluorouracil on 08/03/2012. She reports mild nausea. The right shoulder pain has increased. No other complaint.  Objective:  Vital signs in last 24 hours:  Blood pressure 114/72, pulse 76, temperature 96.9 F (36.1 C), temperature source Oral, resp. rate 20, height 5\' 8"  (1.727 m), weight 155 lb 6.4 oz (70.489 kg).    HEENT: No thrush or ulcer Resp: Lungs clear bilaterally Cardio: Regular rate and rhythm GI: No hepatosplenomegaly, nontender Vascular: No leg edema  Musculoskeletal: Pain with posterior extension and abduction at the right shoulder   Portacath/PICC-without erythema  Lab Results:  Lab Results  Component Value Date   WBC 3.7* 08/17/2012   HGB 13.5 08/17/2012   HCT 38.2 08/17/2012   MCV 89.6 08/17/2012   PLT 65* 08/17/2012   ANC 2.5  X-rays: MRI of the right shoulder 08/06/2012-moderate rotator cuff tendinopathy/tendinosis with partial thickness bursal surface tearing of the anterior aspect of the supraspinatus tendon. No bone lesions.    Medications: I have reviewed the patient's current medications.  Assessment/Plan: 1.Metastatic colon cancer with multiple liver metastases noted on an MRI of the abdomen 12/02/2010 and on a CT 12/13/2010. A transverse colon mass was noted on the CT from 02/11/2011 and confirmed on a colonoscopy 12/17/2010. She began treatment with FOLFIRI/Avastin 12/31/2010. A restaging CT 06/20/2011 confirmed further improvement in the metastatic liver lesions and no evidence for progressive metastatic disease. A restaging CT 08/01/2011 showed stable liver lesions and no new lesions. Restaging CT 09/12/2011 showed stable liver lesions and no new lesions. A restaging CT 10/27/2011 showed stable liver lesions and no new lesions. Restaging CT 12/05/2011 showed mild decrease in size of hepatic metastases and no  evidence of disease progression. Restaging CT 01/26/2012 showed stable hepatic metastases and no new lesions. Restaging CT may 5/8/ 2013 revealed a decreased size of hepatic metastases and no new lesions. Restaging CT 04/23/2012 revealed no change in the measurable "target" lesions, a slight increase in the size of other lesions, and no new lesions. Restaging CT 06/04/2012 showed multiple hepatic metastases were grossly unchanged. Restaging CT 07/19/2012 with stable disease in the liver. Infusional 5-fluorouracil continued on a 2 week schedule.  2. Abdominal pain secondary to hepatomegaly/liver metastases-resolved.  3. Anorexia, early satiety, and weight loss secondary to metastatic colon cancer-resolved.  4. Microcytic anemia. Resolved.  5. History of migraine headaches.  6. Status post uterine fibroid surgery.  7. Port-A-Cath placement 12/20/2010.  8. Delayed nausea following cycle 1 of FOLFIRI/Avastin. Improved with the addition of Aloxi and prophylactic Decadron beginning with cycle #2. The prophylactic Decadron was tapered. We increased the day 1 Decadron back to 20 mg.  9. Neutropenia secondary to chemotherapy. Cycle #4 was held on 02/11/2011. She subsequently received Neulasta support.  10. Intermittent fever with no apparent source for infection, likely "tumor fever" or fever related to chemotherapy. No recent fever.  11. Mucositis secondary to 5-FU, initially grade 2. Irinotecan and 5-FU were dose reduced per study guidelines.  12. History of abdominal cramping. ? Related to irinotecan, 5-FU, or potentially the colon tumor. No recent abdominal cramping.  13. Thrombocytopenia secondary to chemotherapy. Treatment has been held for thrombocytopenia per study guidelines. The thrombocytopenia may be related to Avastin, or chronic liver disease from chemotherapy. The CT on 04/23/2012 confirmed mild splenomegaly. The CT on 06/04/2012 again showed splenomegaly. The thrombocytopenia persists despite  being off  of irinotecan and Avastin for several months.  14. Acne-like rash on the face. Question related to steroids. Resolved.  15. Arthralgias-most likely unrelated to the colon cancer diagnosis and chemotherapy. She has been evaluated by orthopedics. The etiology of the arthralgias is unclear.  16. Persistent discomfort at the right shoulder. Negative CT of the right upper extremity on 07/19/2012. MRI on 08/06/2012 with a partial thickness bursal surface tear of the supraspinatus tendon, moderate rotator cuff tendinopathy/tendinosis 16. Discomfort at the left antecubital region following an IV stick for a CT scan-no clinical evidence of thrombophlebitis.    Disposition:  She appears stable. She will schedule an appointment with orthopedics for evaluation of the persistent right shoulder discomfort and MRI findings. No evidence of metastatic colon cancer at the right shoulder.  I discussed the thrombocytopenia with the protocol chair. He recommends holding further chemotherapy until the platelet count rises. I continue to suspect the thrombocytopenia is related to bone marrow toxicity from chemotherapy or liver toxicity/hypersplenism. I have a low clinical suspicion for a primary hematologic diagnosis.  Ms. Orand will return for a CBC in one week and 2 weeks. The next chemotherapy is being rescheduled for 2 weeks.   Thornton Papas, MD  08/17/2012  5:46 PM

## 2012-08-17 NOTE — Progress Notes (Signed)
August 17, 2012. [name] cycle 38, delayed Natalie Mccarthy was in the cancer center this morning for lab work, physical exam and to receive 5FU/Leucovoron. She was seen by Dr. Truett Perna. Review of her CBC shows she is thrombocytopenic at 65,000. Communication with the Medical Monitor was to hold treatment this week. He and Dr. Truett Perna spoke by phone.  Today Natalie Mccarthy reports she has experienced slight nausea since her last treatment. She calls it "queasy". She had stopped taking protonix. She will restart it and see if it helps resolve that feeling.   She will return to the cancer center in one week for a repeat CBC. She is scheduled to have a CT before the next scheduled treatment.  Per Dr. Truett Perna, abnormal lab values are not clinically significant except for the platelet count.

## 2012-08-17 NOTE — Telephone Encounter (Signed)
Per staff message and POF I have scheduled appts.  JMW  

## 2012-08-24 ENCOUNTER — Other Ambulatory Visit (HOSPITAL_BASED_OUTPATIENT_CLINIC_OR_DEPARTMENT_OTHER): Payer: BC Managed Care – PPO | Admitting: Lab

## 2012-08-24 DIAGNOSIS — C189 Malignant neoplasm of colon, unspecified: Secondary | ICD-10-CM

## 2012-08-24 LAB — CBC WITH DIFFERENTIAL/PLATELET
BASO%: 0.8 % (ref 0.0–2.0)
EOS%: 3 % (ref 0.0–7.0)
HCT: 40.9 % (ref 34.8–46.6)
LYMPH%: 12.8 % — ABNORMAL LOW (ref 14.0–49.7)
MCH: 31.1 pg (ref 25.1–34.0)
MCHC: 34.7 g/dL (ref 31.5–36.0)
NEUT%: 76.3 % (ref 38.4–76.8)
Platelets: 103 10*3/uL — ABNORMAL LOW (ref 145–400)
lymph#: 0.8 10*3/uL — ABNORMAL LOW (ref 0.9–3.3)

## 2012-08-27 ENCOUNTER — Ambulatory Visit (HOSPITAL_COMMUNITY)
Admission: RE | Admit: 2012-08-27 | Discharge: 2012-08-27 | Payer: BC Managed Care – PPO | Source: Ambulatory Visit | Attending: Oncology | Admitting: Oncology

## 2012-08-29 ENCOUNTER — Other Ambulatory Visit: Payer: Self-pay | Admitting: Oncology

## 2012-08-30 ENCOUNTER — Ambulatory Visit (HOSPITAL_COMMUNITY)
Admission: RE | Admit: 2012-08-30 | Discharge: 2012-08-30 | Disposition: A | Discharge status: Home / Self Care | Payer: BC Managed Care – PPO | Source: Ambulatory Visit | Attending: Oncology | Admitting: Oncology

## 2012-08-30 DIAGNOSIS — C787 Secondary malignant neoplasm of liver and intrahepatic bile duct: Secondary | ICD-10-CM | POA: Insufficient documentation

## 2012-08-30 DIAGNOSIS — C189 Malignant neoplasm of colon, unspecified: Secondary | ICD-10-CM | POA: Insufficient documentation

## 2012-08-30 MED ORDER — IOHEXOL 300 MG/ML  SOLN
100.0000 mL | Freq: Once | INTRAMUSCULAR | Status: AC | PRN
  Administered 2012-08-30: 100 mL via INTRAVENOUS

## 2012-08-31 ENCOUNTER — Other Ambulatory Visit (HOSPITAL_BASED_OUTPATIENT_CLINIC_OR_DEPARTMENT_OTHER): Payer: BC Managed Care – PPO | Admitting: Lab

## 2012-08-31 ENCOUNTER — Other Ambulatory Visit: Payer: Self-pay | Admitting: Oncology

## 2012-08-31 ENCOUNTER — Telehealth: Payer: Self-pay | Admitting: *Deleted

## 2012-08-31 ENCOUNTER — Encounter: Payer: Self-pay | Admitting: *Deleted

## 2012-08-31 ENCOUNTER — Telehealth: Payer: Self-pay | Admitting: Oncology

## 2012-08-31 ENCOUNTER — Ambulatory Visit (HOSPITAL_BASED_OUTPATIENT_CLINIC_OR_DEPARTMENT_OTHER): Payer: BC Managed Care – PPO | Admitting: Nurse Practitioner

## 2012-08-31 ENCOUNTER — Ambulatory Visit (HOSPITAL_BASED_OUTPATIENT_CLINIC_OR_DEPARTMENT_OTHER): Payer: BC Managed Care – PPO

## 2012-08-31 VITALS — BP 107/67 | HR 76 | Temp 97.0°F | Resp 20 | Ht 68.0 in | Wt 156.9 lb

## 2012-08-31 DIAGNOSIS — C787 Secondary malignant neoplasm of liver and intrahepatic bile duct: Secondary | ICD-10-CM

## 2012-08-31 DIAGNOSIS — C184 Malignant neoplasm of transverse colon: Secondary | ICD-10-CM

## 2012-08-31 DIAGNOSIS — Z5111 Encounter for antineoplastic chemotherapy: Secondary | ICD-10-CM

## 2012-08-31 DIAGNOSIS — M25519 Pain in unspecified shoulder: Secondary | ICD-10-CM

## 2012-08-31 DIAGNOSIS — C189 Malignant neoplasm of colon, unspecified: Secondary | ICD-10-CM

## 2012-08-31 LAB — CBC WITH DIFFERENTIAL/PLATELET
BASO%: 1 % (ref 0.0–2.0)
EOS%: 4 % (ref 0.0–7.0)
MCH: 31.6 pg (ref 25.1–34.0)
MCHC: 35 g/dL (ref 31.5–36.0)
RDW: 17.2 % — ABNORMAL HIGH (ref 11.2–14.5)
lymph#: 0.7 10*3/uL — ABNORMAL LOW (ref 0.9–3.3)

## 2012-08-31 LAB — URINALYSIS, MICROSCOPIC - CHCC
Nitrite: NEGATIVE
Protein: <30 mg/dL
Specific Gravity, Urine: 1.03 (ref 1.003–1.035)

## 2012-08-31 LAB — COMPREHENSIVE METABOLIC PANEL (CC13)
ALT: 30 U/L (ref 0–55)
AST: 26 U/L (ref 5–34)
Albumin: 4 g/dL (ref 3.5–5.0)
Calcium: 10.1 mg/dL (ref 8.4–10.4)
Chloride: 104 mEq/L (ref 98–107)
Potassium: 4.5 mEq/L (ref 3.5–5.1)

## 2012-08-31 MED ORDER — LEUCOVORIN CALCIUM INJECTION 350 MG
400.0000 mg/m2 | Freq: Once | INTRAVENOUS | Status: AC
  Administered 2012-08-31: 728 mg via INTRAVENOUS
  Filled 2012-08-31: qty 36.4

## 2012-08-31 MED ORDER — SODIUM CHLORIDE 0.9 % IV SOLN: Freq: Once | INTRAVENOUS | Status: DC

## 2012-08-31 MED ORDER — FLUOROURACIL CHEMO INJECTION 500 MG/10ML
230.0000 mg/m2 | Freq: Once | INTRAVENOUS | Status: AC
  Administered 2012-08-31: 400 mg via INTRAVENOUS
  Filled 2012-08-31: qty 8

## 2012-08-31 MED ORDER — SODIUM CHLORIDE 0.9 % IV SOLN
5.0000 mg/kg | Freq: Once | INTRAVENOUS | Status: AC
  Administered 2012-08-31: 325 mg via INTRAVENOUS
  Filled 2012-08-31: qty 13

## 2012-08-31 MED ORDER — SODIUM CHLORIDE 0.9 % IV SOLN
1360.0000 mg/m2 | INTRAVENOUS | Status: DC
  Administered 2012-08-31: 2500 mg via INTRAVENOUS
  Filled 2012-08-31: qty 50

## 2012-08-31 NOTE — Progress Notes (Signed)
08/31/12 Natalie Mccarthy is in the cancer center today for lab work, physical exam, and to receive treatment on the [name] study. This treatment was delayed D/T platelet count of 65,000. The count today is 100,000. She is still experiencing shoulder pain but reports it seems to be a little better after the PT session on 10/25. She does still have some low grade nausea but it has not affected her appetite.  All lab results are WNL for treatment.   She had a CT of the abdomen 10/28. It was reviewed by Dr. Truett Perna.  There is some progression of target and non-target lesions but it does not meet the criteria of > 20%. Therefore, Dr. Truett Perna has decided to re-start avastin.   Discussion with the Medical Monitor allowed Natalie Mccarthy to remain on study and receive the study supplied bevacizumab.

## 2012-08-31 NOTE — Telephone Encounter (Signed)
Per staff message and POF I have scheduled aappts. JMW

## 2012-08-31 NOTE — Patient Instructions (Signed)
ELONDA KRUT 1956-04-01 [id number]  [facility] Discharge Instructions for Patients Receiving Chemotherapy  Today you received the following chemotherapy agents: leucovorin, 5FU, Avastin   To help prevent nausea and vomiting after your treatment, we encourage you to take your nausea medication as prescribed; if you develop nausea and vomiting that is not controlled by your nausea medication, call the clinic.   BELOW ARE SYMPTOMS THAT SHOULD BE REPORTED IMMEDIATELY:  *FEVER GREATER THAN 100.5 F *CHILLS WITH OR WITHOUT FEVER *UNUSUAL SHORTNESS OF BREATH *UNUSUAL BRUISING OR BLEEDING *URINARY PROBLEMS *BOWEL PROBLEMS  I have been informed and understand all the instructions given to me.   Please call the [facility] at [phone] during business hours should you have any further questions or need assistance in obtaining follow-up care. If you have a medical emergency, please dial 911.

## 2012-08-31 NOTE — Telephone Encounter (Signed)
Gave pt appt for October and November 2013 lab, MD emailed Marcelino Duster regarding chemo

## 2012-08-31 NOTE — Progress Notes (Signed)
OFFICE PROGRESS NOTE  Interval history:  Natalie Mccarthy returns as scheduled. She was last treated with 5 fluorouracil on 08/03/2012. Restaging CT evaluation 08/30/2012 showed the largest lesion in the central left hepatic lobe and right hepatic lobe measured 4.7 x 4.7 cm as compared to 4.7 x 3.9 centimeters on the prior study. A lesion in the more superior right hepatic lobe measured 2.8 x 4.4 cm as compared to 2.5 x 3.9 cm on the prior study. No discrete new lesions were identified.  Natalie Mccarthy overall is feeling well. She denies nausea/vomiting. No mouth sores. No diarrhea. She denies bleeding. No shortness of breath or chest pain. No abdominal pain. No leg swelling. She has a good appetite. The right shoulder pain is somewhat better.   Objective: Blood pressure 107/67, pulse 76, temperature 97 F (36.1 C), temperature source Oral, resp. rate 20, height 5\' 8"  (1.727 m), weight 156 lb 14.4 oz (71.169 kg).  Oropharynx is without thrush or ulceration. Lungs are clear. Regular cardiac rhythm. Port-A-Cath site is without erythema. Abdomen is soft and nontender. No hepatomegaly. Extremities are without edema. Calves are soft and nontender.  Lab Results: Lab Results  Component Value Date   WBC 5.1 08/31/2012   HGB 14.0 08/31/2012   HCT 40.1 08/31/2012   MCV 90.3 08/31/2012   PLT 100* 08/31/2012    Chemistry:    Chemistry      Component Value Date/Time   NA 140 08/31/2012 0947   NA 139 06/22/2012 1108   NA 143 01/27/2012 1354   K 4.5 08/31/2012 0947   K 3.8 06/22/2012 1108   K 3.8 01/27/2012 1354   CL 104 08/31/2012 0947   CL 103 06/22/2012 1108   CL 97* 01/27/2012 1354   CO2 29 08/31/2012 0947   CO2 31 06/22/2012 1108   CO2 29 01/27/2012 1354   BUN 20.0 08/31/2012 0947   BUN 19 06/22/2012 1108   BUN 11 01/27/2012 1354   CREATININE 0.9 08/31/2012 0947   CREATININE 0.75 06/22/2012 1108   CREATININE 0.9 01/27/2012 1354      Component Value Date/Time   CALCIUM 10.1 08/31/2012 0947   CALCIUM 9.7 06/22/2012 1108   CALCIUM 8.9 01/27/2012 1354   ALKPHOS 144 08/31/2012 0947   ALKPHOS 185* 06/22/2012 1108   ALKPHOS 198* 01/27/2012 1354   AST 26 08/31/2012 0947   AST 36 06/22/2012 1108   AST 34 01/27/2012 1354   ALT 30 08/31/2012 0947   ALT 36* 06/22/2012 1108   BILITOT 0.67 08/31/2012 0947   BILITOT 0.5 06/22/2012 1108   BILITOT 0.50 01/27/2012 1354       Studies/Results: Ct Abdomen W Contrast  08/31/2012  *RADIOLOGY REPORT*  Clinical Data: Colon cancer with liver metastasis.  Chemotherapy in progress.  CT ABDOMEN WITH CONTRAST  Technique:  Multidetector CT imaging of the abdomen was performed following the standard protocol during bolus administration of intravenous contrast.  Contrast: OMNIPAQUE IOHEXOL 300 MG/ML  SOLN  Comparison: 07/19/2012  RECIST protocol 1.1  Target Lesions:  1.Central hepatic lesion (image 15, series 2) measures 4.2 cm compared to 4.4 cm on prior. 2.  Posterior left hepatic lobe lesion measures 2.0 cm (image 16, series 2) compared to 1.4 cm on prior.  Non Target lesions:  1. None  Findings: Multiple hepatic metastasis again demonstrated.  The largest lesion in the central left hepatic lobe and right hepatic lobe measures 4.7 x 4.7 compared to 4.7 x 3.9 cm on prior.  Lesion in the more superior  right hepatic lobe measures 2.8 x 4.4 cm compared to 2.5 x 3.9 cm on prior.  No new hepatic lesions identified.  The lung bases are clear.  Gallbladder, pancreas, spleen, adrenal glands, and kidneys are normal.  The stomach and limited view of small bowel and colon unremarkable. There is no retroperitoneal periportal lymphadenopathy.  Review of the skeleton is unremarkable.  IMPRESSION:  Mild increase in size of large hepatic metastasis.  No discrete new lesions identified.   Original Report Authenticated By: Genevive Bi, M.D.    Mr Shoulder Right Wo Contrast  08/06/2012  *RADIOLOGY REPORT*  Clinical Data:  Right shoulder pain.  History of metastatic colon  cancer.  MRI OF THE RIGHT SHOULDER WITHOUT CONTRAST  Technique:  Multiplanar, multisequence MR imaging of the right shoulder was performed.  No intravenous contrast was administered.  Comparison:  None  FINDINGS: Rotator cuff:  Moderate rotator cuff tendinopathy/tendinosis with partial thickness bursal surface tearing of the anterior aspect of the supraspinatus tendon.  No full-thickness retracted rotator cuff tear. Muscles:  No muscle tear, myositis or fatty atrophy. Biceps long head:  Intact.  Acromioclavicular Joint:  Moderate AC joint degenerative changes with edema in the distal clavicle and acromion suggesting an ongoing stress-related process.  The acromion is type 2 in shape. No lateral downsloping or undersurface spurring change. Glenohumeral Joint:  Mild degenerative changes.  Small joint effusion.  Labrum:  Prominent sublabral foramen but no definite labral tear. The superior labrum is intact. Bones:  No acute bony findings.  Mild subacromial/subdeltoid bursitis.  IMPRESSION:  1.  Moderate rotator cuff tendinopathy/tendinosis.  Shallow bursal surface tear involving the anterior aspect of the supraspinatus tendon near its attachment site. 2.  Intact biceps tendon and glenoid labra. 3.  No bone lesions. 4.  Mild subacromial/subdeltoid bursitis. 5.  AC joint degenerative changes but no other significant findings for bony impingement.   Original Report Authenticated By: P. Loralie Champagne, M.D.     Medications: I have reviewed the patient's current medications.  Assessment/Plan:  1.Metastatic colon cancer with multiple liver metastases noted on an MRI of the abdomen 12/02/2010 and on a CT 12/13/2010. A transverse colon mass was noted on the CT from 02/11/2011 and confirmed on a colonoscopy 12/17/2010. She began treatment with FOLFIRI/Avastin 12/31/2010. A restaging CT 06/20/2011 confirmed further improvement in the metastatic liver lesions and no evidence for progressive metastatic disease. A restaging  CT 08/01/2011 showed stable liver lesions and no new lesions. Restaging CT 09/12/2011 showed stable liver lesions and no new lesions. A restaging CT 10/27/2011 showed stable liver lesions and no new lesions. Restaging CT 12/05/2011 showed mild decrease in size of hepatic metastases and no evidence of disease progression. Restaging CT 01/26/2012 showed stable hepatic metastases and no new lesions. Restaging CT may 5/8/ 2013 revealed a decreased size of hepatic metastases and no new lesions. Restaging CT 04/23/2012 revealed no change in the measurable "target" lesions, a slight increase in the size of other lesions, and no new lesions. Restaging CT 06/04/2012 showed multiple hepatic metastases were grossly unchanged. Restaging CT 07/19/2012 with stable disease in the liver. Infusional 5-fluorouracil continued on a 2 week schedule. Restaging CT evaluation 08/30/2012 with mild increase in size of large hepatic metastases. 2. Abdominal pain secondary to hepatomegaly/liver metastases-resolved.  3. Anorexia, early satiety, and weight loss secondary to metastatic colon cancer-resolved.  4. Microcytic anemia. Resolved.  5. History of migraine headaches.  6. Status post uterine fibroid surgery.  7. Port-A-Cath placement 12/20/2010.  8. Delayed nausea  following cycle 1 of FOLFIRI/Avastin. Improved with the addition of Aloxi and prophylactic Decadron beginning with cycle #2. The prophylactic Decadron was tapered. We increased the day 1 Decadron back to 20 mg.  9. Neutropenia secondary to chemotherapy. Cycle #4 was held on 02/11/2011. She subsequently received Neulasta support.  10. Intermittent fever with no apparent source for infection, likely "tumor fever" or fever related to chemotherapy. No recent fever.  11. Mucositis secondary to 5-FU, initially grade 2. Irinotecan and 5-FU were dose reduced per study guidelines.  12. History of abdominal cramping. ? Related to irinotecan, 5-FU, or potentially the colon tumor.  No recent abdominal cramping.  13. Thrombocytopenia secondary to chemotherapy. Treatment has been held for thrombocytopenia per study guidelines. The thrombocytopenia may be related to Avastin, or chronic liver disease from chemotherapy. The CT on 04/23/2012 confirmed mild splenomegaly. The CT on 06/04/2012 again showed splenomegaly. The thrombocytopenia persisted despite being off of irinotecan and Avastin for several months. The platelet count was improved on 08/24/2012 and stable on 08/31/2012. 14. Acne-like rash on the face. Question related to steroids. Resolved.  15. Arthralgias-most likely unrelated to the colon cancer diagnosis and chemotherapy. She has been evaluated by orthopedics. The etiology of the arthralgias is unclear.  16. Persistent discomfort at the right shoulder. Negative CT of the right upper extremity on 07/19/2012. MRI on 08/06/2012 with a partial thickness bursal surface tear of the supraspinatus tendon, moderate rotator cuff tendinopathy/tendinosis. She is now followed by orthopedics.  16. Discomfort at the left antecubital region following an IV stick for a CT scan-no clinical evidence of thrombophlebitis.   Disposition-Natalie Mccarthy appears stable. The recent restaging CT evaluation is concerning for mild progression of metastatic disease in the liver. Dr. Truett Perna recommends resuming treatment with 5 fluorouracil/Avastin on a 2 week schedule beginning today. Dr. Truett Perna will discuss this with the protocol chair prior to proceeding with treatment today. Ms. Ciaramitaro will return for a followup visit in 2 weeks. She will contact the office in the interim with any problems.  Plan per Dr. Truett Perna.   Lonna Cobb ANP/GNP-BC

## 2012-09-02 ENCOUNTER — Telehealth: Payer: Self-pay | Admitting: Oncology

## 2012-09-02 ENCOUNTER — Ambulatory Visit (HOSPITAL_BASED_OUTPATIENT_CLINIC_OR_DEPARTMENT_OTHER): Payer: BC Managed Care – PPO

## 2012-09-02 VITALS — BP 130/74 | HR 70 | Temp 97.8°F

## 2012-09-02 DIAGNOSIS — C787 Secondary malignant neoplasm of liver and intrahepatic bile duct: Secondary | ICD-10-CM

## 2012-09-02 DIAGNOSIS — C189 Malignant neoplasm of colon, unspecified: Secondary | ICD-10-CM

## 2012-09-02 DIAGNOSIS — C184 Malignant neoplasm of transverse colon: Secondary | ICD-10-CM

## 2012-09-02 MED ORDER — SODIUM CHLORIDE 0.9 % IJ SOLN
10.0000 mL | INTRAMUSCULAR | Status: DC | PRN
  Administered 2012-09-02: 10 mL
  Filled 2012-09-02: qty 10

## 2012-09-02 MED ORDER — HEPARIN SOD (PORK) LOCK FLUSH 100 UNIT/ML IV SOLN
500.0000 [IU] | Freq: Once | INTRAVENOUS | Status: AC | PRN
  Administered 2012-09-02: 500 [IU]
  Filled 2012-09-02: qty 5

## 2012-09-02 NOTE — Telephone Encounter (Signed)
Talked to pat's husband, they already have calendar for November 2014

## 2012-09-02 NOTE — Patient Instructions (Signed)
Call MD for problems 

## 2012-09-12 ENCOUNTER — Other Ambulatory Visit: Payer: Self-pay | Admitting: Oncology

## 2012-09-14 ENCOUNTER — Encounter: Payer: Self-pay | Admitting: *Deleted

## 2012-09-14 ENCOUNTER — Telehealth: Payer: Self-pay | Admitting: Oncology

## 2012-09-14 ENCOUNTER — Other Ambulatory Visit (HOSPITAL_BASED_OUTPATIENT_CLINIC_OR_DEPARTMENT_OTHER): Payer: BC Managed Care – PPO | Admitting: Lab

## 2012-09-14 ENCOUNTER — Ambulatory Visit (HOSPITAL_BASED_OUTPATIENT_CLINIC_OR_DEPARTMENT_OTHER): Payer: BC Managed Care – PPO | Admitting: Nurse Practitioner

## 2012-09-14 ENCOUNTER — Ambulatory Visit (HOSPITAL_BASED_OUTPATIENT_CLINIC_OR_DEPARTMENT_OTHER): Payer: BC Managed Care – PPO

## 2012-09-14 VITALS — BP 137/89 | HR 83 | Temp 97.0°F | Resp 20 | Ht 68.0 in | Wt 155.7 lb

## 2012-09-14 VITALS — BP 123/77 | HR 76

## 2012-09-14 DIAGNOSIS — C189 Malignant neoplasm of colon, unspecified: Secondary | ICD-10-CM

## 2012-09-14 DIAGNOSIS — C787 Secondary malignant neoplasm of liver and intrahepatic bile duct: Secondary | ICD-10-CM

## 2012-09-14 DIAGNOSIS — Z5111 Encounter for antineoplastic chemotherapy: Secondary | ICD-10-CM

## 2012-09-14 DIAGNOSIS — C184 Malignant neoplasm of transverse colon: Secondary | ICD-10-CM

## 2012-09-14 LAB — COMPREHENSIVE METABOLIC PANEL (CC13)
Alkaline Phosphatase: 125 U/L (ref 40–150)
BUN: 22 mg/dL (ref 7.0–26.0)
CO2: 29 mEq/L (ref 22–29)
Creatinine: 0.9 mg/dL (ref 0.6–1.1)
Glucose: 99 mg/dl (ref 70–99)
Total Bilirubin: 0.75 mg/dL (ref 0.20–1.20)
Total Protein: 7 g/dL (ref 6.4–8.3)

## 2012-09-14 LAB — CBC WITH DIFFERENTIAL/PLATELET
BASO%: 0.8 % (ref 0.0–2.0)
Basophils Absolute: 0 10*3/uL (ref 0.0–0.1)
EOS%: 3.1 % (ref 0.0–7.0)
HCT: 41.4 % (ref 34.8–46.6)
MCH: 31.6 pg (ref 25.1–34.0)
MCHC: 35 g/dL (ref 31.5–36.0)
MCV: 90.4 fL (ref 79.5–101.0)
MONO%: 9.8 % (ref 0.0–14.0)
NEUT%: 68 % (ref 38.4–76.8)
lymph#: 0.9 10*3/uL (ref 0.9–3.3)

## 2012-09-14 LAB — URINALYSIS, MICROSCOPIC - CHCC
Ketones: NEGATIVE mg/dL
Leukocyte Esterase: NEGATIVE
Nitrite: NEGATIVE
Protein: NEGATIVE mg/dL
RBC / HPF: NEGATIVE (ref 0–2)
pH: 6 (ref 4.6–8.0)

## 2012-09-14 MED ORDER — LEUCOVORIN CALCIUM INJECTION 350 MG
400.0000 mg/m2 | Freq: Once | INTRAVENOUS | Status: AC
  Administered 2012-09-14: 728 mg via INTRAVENOUS
  Filled 2012-09-14: qty 36.4

## 2012-09-14 MED ORDER — SODIUM CHLORIDE 0.9 % IV SOLN
Freq: Once | INTRAVENOUS | Status: AC
  Administered 2012-09-14: 12:00:00 via INTRAVENOUS

## 2012-09-14 MED ORDER — SODIUM CHLORIDE 0.9 % IV SOLN
Freq: Once | INTRAVENOUS | Status: AC
  Administered 2012-09-14: 13:00:00 via INTRAVENOUS

## 2012-09-14 MED ORDER — SODIUM CHLORIDE 0.9 % IV SOLN
5.0000 mg/kg | Freq: Once | INTRAVENOUS | Status: AC
  Administered 2012-09-14: 325 mg via INTRAVENOUS
  Filled 2012-09-14: qty 13

## 2012-09-14 MED ORDER — FLUOROURACIL CHEMO INJECTION 500 MG/10ML
230.0000 mg/m2 | Freq: Once | INTRAVENOUS | Status: AC
  Administered 2012-09-14: 400 mg via INTRAVENOUS
  Filled 2012-09-14: qty 8

## 2012-09-14 MED ORDER — SODIUM CHLORIDE 0.9 % IV SOLN
1360.0000 mg/m2 | INTRAVENOUS | Status: DC
  Administered 2012-09-14: 2500 mg via INTRAVENOUS
  Filled 2012-09-14: qty 50

## 2012-09-14 NOTE — Patient Instructions (Signed)
[facility] Discharge Instructions for Patients Receiving Chemotherapy  Today you received the following chemotherapy agents: Avastin, Leucovorin, 5FU  To help prevent nausea and vomiting after your treatment, we encourage you to take your nausea medication as directed by your MD. If you develop nausea and vomiting that is not controlled by your nausea medication, call the clinic. If it is after clinic hours your family physician or the after hours number for the clinic or go to the Emergency Department.   BELOW ARE SYMPTOMS THAT SHOULD BE REPORTED IMMEDIATELY:  *FEVER GREATER THAN 100.5 F  *CHILLS WITH OR WITHOUT FEVER  NAUSEA AND VOMITING THAT IS NOT CONTROLLED WITH YOUR NAUSEA MEDICATION  *UNUSUAL SHORTNESS OF BREATH  *UNUSUAL BRUISING OR BLEEDING  TENDERNESS IN MOUTH AND THROAT WITH OR WITHOUT PRESENCE OF ULCERS  *URINARY PROBLEMS  *BOWEL PROBLEMS  UNUSUAL RASH Items with * indicate a potential emergency and should be followed up as soon as possible.   Feel free to call the clinic you have any questions or concerns. The clinic phone number is [phone].

## 2012-09-14 NOTE — Progress Notes (Signed)
OFFICE PROGRESS NOTE  Interval history:  Ms. Ritsema returns as scheduled. Treatment was resumed with 5 fluorouracil/Avastin on 08/31/2012. She had no nausea around the chemotherapy. She did have some nausea last week associated with a migraine headache. She denies diarrhea. No mouth sores. No hand or foot pain or redness. She denies abdominal pain. No shortness of breath or chest pain. No leg swelling or calf pain. She has recently noted a small amount of blood when she blows her nose. No other bleeding. Right shoulder pain is better.   Objective: Blood pressure 137/89, pulse 83, temperature 97 F (36.1 C), temperature source Oral, resp. rate 20, height 5\' 8"  (1.727 m), weight 155 lb 11.2 oz (70.625 kg).  Oropharynx is without thrush or ulceration. Lungs are clear. No wheezes or rales. Regular cardiac rhythm. Port-A-Cath site is without erythema. Abdomen is soft and nontender. No organomegaly. Extremities are without edema. Calves are soft and nontender.  Lab Results: Lab Results  Component Value Date   WBC 4.9 09/14/2012   HGB 14.5 09/14/2012   HCT 41.4 09/14/2012   MCV 90.4 09/14/2012   PLT 92* 09/14/2012    Chemistry:    Chemistry      Component Value Date/Time   NA 139 09/14/2012 1018   NA 139 06/22/2012 1108   NA 143 01/27/2012 1354   K 4.3 09/14/2012 1018   K 3.8 06/22/2012 1108   K 3.8 01/27/2012 1354   CL 103 09/14/2012 1018   CL 103 06/22/2012 1108   CL 97* 01/27/2012 1354   CO2 29 09/14/2012 1018   CO2 31 06/22/2012 1108   CO2 29 01/27/2012 1354   BUN 22.0 09/14/2012 1018   BUN 19 06/22/2012 1108   BUN 11 01/27/2012 1354   CREATININE 0.9 09/14/2012 1018   CREATININE 0.75 06/22/2012 1108   CREATININE 0.9 01/27/2012 1354      Component Value Date/Time   CALCIUM 9.8 09/14/2012 1018   CALCIUM 9.7 06/22/2012 1108   CALCIUM 8.9 01/27/2012 1354   ALKPHOS 125 09/14/2012 1018   ALKPHOS 185* 06/22/2012 1108   ALKPHOS 198* 01/27/2012 1354   AST 22 09/14/2012 1018   AST 36  06/22/2012 1108   AST 34 01/27/2012 1354   ALT 27 09/14/2012 1018   ALT 36* 06/22/2012 1108   BILITOT 0.75 09/14/2012 1018   BILITOT 0.5 06/22/2012 1108   BILITOT 0.50 01/27/2012 1354       Studies/Results: Ct Abdomen W Contrast  08/31/2012  *RADIOLOGY REPORT*  Clinical Data: Colon cancer with liver metastasis.  Chemotherapy in progress.  CT ABDOMEN WITH CONTRAST  Technique:  Multidetector CT imaging of the abdomen was performed following the standard protocol during bolus administration of intravenous contrast.  Contrast: OMNIPAQUE IOHEXOL 300 MG/ML  SOLN  Comparison: 07/19/2012  RECIST protocol 1.1  Target Lesions:  1.Central hepatic lesion (image 15, series 2) measures 4.2 cm compared to 4.4 cm on prior. 2.  Posterior left hepatic lobe lesion measures 2.0 cm (image 16, series 2) compared to 1.4 cm on prior.  Non Target lesions:  1. None  Findings: Multiple hepatic metastasis again demonstrated.  The largest lesion in the central left hepatic lobe and right hepatic lobe measures 4.7 x 4.7 compared to 4.7 x 3.9 cm on prior.  Lesion in the more superior right hepatic lobe measures 2.8 x 4.4 cm compared to 2.5 x 3.9 cm on prior.  No new hepatic lesions identified.  The lung bases are clear.  Gallbladder, pancreas, spleen, adrenal  glands, and kidneys are normal.  The stomach and limited view of small bowel and colon unremarkable. There is no retroperitoneal periportal lymphadenopathy.  Review of the skeleton is unremarkable.  IMPRESSION:  Mild increase in size of large hepatic metastasis.  No discrete new lesions identified.   Original Report Authenticated By: Genevive Bi, M.D.     Medications: I have reviewed the patient's current medications.  Assessment/Plan:  1.Metastatic colon cancer with multiple liver metastases noted on an MRI of the abdomen 12/02/2010 and on a CT 12/13/2010. A transverse colon mass was noted on the CT from 02/11/2011 and confirmed on a colonoscopy 12/17/2010. She  began treatment with FOLFIRI/Avastin 12/31/2010. A restaging CT 06/20/2011 confirmed further improvement in the metastatic liver lesions and no evidence for progressive metastatic disease. A restaging CT 08/01/2011 showed stable liver lesions and no new lesions. Restaging CT 09/12/2011 showed stable liver lesions and no new lesions. A restaging CT 10/27/2011 showed stable liver lesions and no new lesions. Restaging CT 12/05/2011 showed mild decrease in size of hepatic metastases and no evidence of disease progression. Restaging CT 01/26/2012 showed stable hepatic metastases and no new lesions. Restaging CT may 5/8/ 2013 revealed a decreased size of hepatic metastases and no new lesions. Restaging CT 04/23/2012 revealed no change in the measurable "target" lesions, a slight increase in the size of other lesions, and no new lesions. Restaging CT 06/04/2012 showed multiple hepatic metastases were grossly unchanged. Restaging CT 07/19/2012 with stable disease in the liver. Infusional 5-fluorouracil continued on a 2 week schedule. Treatment was held after the 08/03/2012 cycle due to thrombocytopenia. Restaging CT evaluation 08/30/2012 with mild increase in size of large hepatic metastases. Treatment was resumed with 5 fluorouracil and Avastin on 08/31/2012. 2. Abdominal pain secondary to hepatomegaly/liver metastases-resolved.  3. Anorexia, early satiety, and weight loss secondary to metastatic colon cancer-resolved.  4. Microcytic anemia. Resolved.  5. History of migraine headaches.  6. Status post uterine fibroid surgery.  7. Port-A-Cath placement 12/20/2010.  8. Delayed nausea following cycle 1 of FOLFIRI/Avastin. Improved with the addition of Aloxi and prophylactic Decadron beginning with cycle #2. The prophylactic Decadron was tapered. We increased the day 1 Decadron back to 20 mg.  9. Neutropenia secondary to chemotherapy. Cycle #4 was held on 02/11/2011. She subsequently received Neulasta support.  10.  Intermittent fever with no apparent source for infection, likely "tumor fever" or fever related to chemotherapy. No recent fever.  11. Mucositis secondary to 5-FU, initially grade 2. Irinotecan and 5-FU were dose reduced per study guidelines.  12. History of abdominal cramping. ? Related to irinotecan, 5-FU, or potentially the colon tumor. No recent abdominal cramping.  13. Thrombocytopenia secondary to chemotherapy. Treatment has been held for thrombocytopenia per study guidelines. The thrombocytopenia may be related to Avastin, or chronic liver disease from chemotherapy. The CT on 04/23/2012 confirmed mild splenomegaly. The CT on 06/04/2012 again showed splenomegaly. The thrombocytopenia persisted despite being off of irinotecan and Avastin for several months. The platelet count was improved on 08/24/2012 and stable on 08/31/2012.  14. Acne-like rash on the face. Question related to steroids. Resolved.  15. Arthralgias-most likely unrelated to the colon cancer diagnosis and chemotherapy. She has been evaluated by orthopedics. The etiology of the arthralgias is unclear.  16. Persistent discomfort at the right shoulder. Negative CT of the right upper extremity on 07/19/2012. MRI on 08/06/2012 with a partial thickness bursal surface tear of the supraspinatus tendon, moderate rotator cuff tendinopathy/tendinosis. She is now followed by orthopedics. The pain  is better. 16. Discomfort at the left antecubital region following an IV stick for a CT scan-no clinical evidence of thrombophlebitis.   Disposition-Ms. Kashani appears stable. The platelet count is stable. Plan to proceed with 5 fluorouracil/Avastin today as scheduled. She will return for a followup visit and the next cycle in 2 weeks. She will contact the office in the interim with any problems.  ECoG performance status measured at 0.    Plan reviewed with Dr. Truett Perna.   Lonna Cobb ANP/GNP-BC

## 2012-09-14 NOTE — Telephone Encounter (Signed)
gv pt appt schedule for November and December.  °

## 2012-09-14 NOTE — Progress Notes (Signed)
Nov. 12, 2013 [name], cycle 39. Natalie Mccarthy is in the cancer center today for lab work physical exam and treatment. She was seen By L. Maisie Fus, NP. Review of lab results shows a platelet count of 92,000. Dr. Truett Perna wanted to proceed with treatment with 5FU and leucovorin at the previous dose level and include the avastin at the previous dose level. Communication with the [facility] Medical Monitor supported this plan.  Per Dr. Truett Perna, abnormal lab results are not clinically significant.   Reviewed the protocol consent for amendment 4, version 5.1 with Natalie Mccarthy paying special attention the the up-dated section about risks and side effects of avastin. All questions were answered. She then signed and dated the protocol consent.

## 2012-09-14 NOTE — Progress Notes (Signed)
Post avastin BP 

## 2012-09-14 NOTE — Progress Notes (Signed)
Ok to treat with plt count 92 today, per Narda Bonds, research RN.

## 2012-09-16 ENCOUNTER — Ambulatory Visit (HOSPITAL_BASED_OUTPATIENT_CLINIC_OR_DEPARTMENT_OTHER): Payer: BC Managed Care – PPO

## 2012-09-16 VITALS — BP 114/70 | HR 78 | Temp 97.7°F

## 2012-09-16 DIAGNOSIS — C184 Malignant neoplasm of transverse colon: Secondary | ICD-10-CM

## 2012-09-16 DIAGNOSIS — C787 Secondary malignant neoplasm of liver and intrahepatic bile duct: Secondary | ICD-10-CM

## 2012-09-16 DIAGNOSIS — C189 Malignant neoplasm of colon, unspecified: Secondary | ICD-10-CM

## 2012-09-16 DIAGNOSIS — Z452 Encounter for adjustment and management of vascular access device: Secondary | ICD-10-CM

## 2012-09-16 MED ORDER — HEPARIN SOD (PORK) LOCK FLUSH 100 UNIT/ML IV SOLN
500.0000 [IU] | Freq: Once | INTRAVENOUS | Status: AC | PRN
  Administered 2012-09-16: 500 [IU]
  Filled 2012-09-16: qty 5

## 2012-09-16 MED ORDER — SODIUM CHLORIDE 0.9 % IJ SOLN
10.0000 mL | INTRAMUSCULAR | Status: DC | PRN
  Administered 2012-09-16: 10 mL
  Filled 2012-09-16: qty 10

## 2012-09-25 ENCOUNTER — Other Ambulatory Visit: Payer: Self-pay | Admitting: Oncology

## 2012-09-27 ENCOUNTER — Ambulatory Visit (HOSPITAL_BASED_OUTPATIENT_CLINIC_OR_DEPARTMENT_OTHER): Payer: BC Managed Care – PPO

## 2012-09-27 ENCOUNTER — Telehealth: Payer: Self-pay | Admitting: Oncology

## 2012-09-27 ENCOUNTER — Ambulatory Visit (HOSPITAL_BASED_OUTPATIENT_CLINIC_OR_DEPARTMENT_OTHER): Payer: BC Managed Care – PPO | Admitting: Oncology

## 2012-09-27 ENCOUNTER — Encounter: Payer: Self-pay | Admitting: *Deleted

## 2012-09-27 ENCOUNTER — Telehealth: Payer: Self-pay | Admitting: *Deleted

## 2012-09-27 ENCOUNTER — Other Ambulatory Visit (HOSPITAL_BASED_OUTPATIENT_CLINIC_OR_DEPARTMENT_OTHER): Payer: BC Managed Care – PPO | Admitting: Lab

## 2012-09-27 VITALS — BP 129/70 | HR 74 | Temp 97.6°F | Resp 20 | Ht 68.0 in | Wt 153.2 lb

## 2012-09-27 VITALS — BP 118/68 | HR 73

## 2012-09-27 DIAGNOSIS — C787 Secondary malignant neoplasm of liver and intrahepatic bile duct: Secondary | ICD-10-CM

## 2012-09-27 DIAGNOSIS — C184 Malignant neoplasm of transverse colon: Secondary | ICD-10-CM

## 2012-09-27 DIAGNOSIS — C189 Malignant neoplasm of colon, unspecified: Secondary | ICD-10-CM

## 2012-09-27 DIAGNOSIS — Z5111 Encounter for antineoplastic chemotherapy: Secondary | ICD-10-CM

## 2012-09-27 DIAGNOSIS — R110 Nausea: Secondary | ICD-10-CM

## 2012-09-27 DIAGNOSIS — M25519 Pain in unspecified shoulder: Secondary | ICD-10-CM

## 2012-09-27 LAB — COMPREHENSIVE METABOLIC PANEL (CC13)
Albumin: 3.8 g/dL (ref 3.5–5.0)
Alkaline Phosphatase: 171 U/L — ABNORMAL HIGH (ref 40–150)
BUN: 18 mg/dL (ref 7.0–26.0)
CO2: 29 mEq/L (ref 22–29)
Calcium: 9.6 mg/dL (ref 8.4–10.4)
Chloride: 104 mEq/L (ref 98–107)
Glucose: 93 mg/dl (ref 70–99)
Potassium: 4 mEq/L (ref 3.5–5.1)
Total Protein: 6.9 g/dL (ref 6.4–8.3)

## 2012-09-27 LAB — URINALYSIS, MICROSCOPIC - CHCC
Blood: NEGATIVE
Glucose: NEGATIVE g/dL
Nitrite: NEGATIVE
Protein: <30 mg/dL

## 2012-09-27 LAB — CBC WITH DIFFERENTIAL/PLATELET
BASO%: 1 % (ref 0.0–2.0)
Basophils Absolute: 0 10*3/uL (ref 0.0–0.1)
HCT: 39.7 % (ref 34.8–46.6)
HGB: 13.9 g/dL (ref 11.6–15.9)
LYMPH%: 16.4 % (ref 14.0–49.7)
MCH: 31.7 pg (ref 25.1–34.0)
MCHC: 34.9 g/dL (ref 31.5–36.0)
MONO#: 0.4 10*3/uL (ref 0.1–0.9)
NEUT%: 68.9 % (ref 38.4–76.8)
Platelets: 107 10*3/uL — ABNORMAL LOW (ref 145–400)
WBC: 3.8 10*3/uL — ABNORMAL LOW (ref 3.9–10.3)

## 2012-09-27 MED ORDER — FLUOROURACIL CHEMO INJECTION 500 MG/10ML
230.0000 mg/m2 | Freq: Once | INTRAVENOUS | Status: AC
  Administered 2012-09-27: 400 mg via INTRAVENOUS
  Filled 2012-09-27: qty 8

## 2012-09-27 MED ORDER — SODIUM CHLORIDE 0.9 % IV SOLN
Freq: Once | INTRAVENOUS | Status: AC
  Administered 2012-09-27: 10:00:00 via INTRAVENOUS

## 2012-09-27 MED ORDER — SODIUM CHLORIDE 0.9 % IV SOLN
1360.0000 mg/m2 | INTRAVENOUS | Status: DC
  Administered 2012-09-27: 2500 mg via INTRAVENOUS
  Filled 2012-09-27: qty 50

## 2012-09-27 MED ORDER — SODIUM CHLORIDE 0.9 % IV SOLN
Freq: Once | INTRAVENOUS | Status: AC
  Administered 2012-09-27: 11:00:00 via INTRAVENOUS

## 2012-09-27 MED ORDER — ONDANSETRON 8 MG/50ML IVPB (CHCC)
8.0000 mg | Freq: Once | INTRAVENOUS | Status: AC
  Administered 2012-09-27: 8 mg via INTRAVENOUS

## 2012-09-27 MED ORDER — SODIUM CHLORIDE 0.9 % IV SOLN
5.0000 mg/kg | Freq: Once | INTRAVENOUS | Status: AC
  Administered 2012-09-27: 325 mg via INTRAVENOUS
  Filled 2012-09-27: qty 13

## 2012-09-27 MED ORDER — LEUCOVORIN CALCIUM INJECTION 350 MG
400.0000 mg/m2 | Freq: Once | INTRAVENOUS | Status: AC
  Administered 2012-09-27: 728 mg via INTRAVENOUS
  Filled 2012-09-27: qty 36.4

## 2012-09-27 NOTE — Progress Notes (Signed)
Reports small amount of bright red blood in tissue when she blows her nose. No other bleeding or bruising.

## 2012-09-27 NOTE — Progress Notes (Signed)
09/27/12 @09 :40am, [name] [id number], Cycle 40, Day 1:  Natalie Mccarthy, accompanied by her husband, into the [facility] for a CBC, CMET, UA, to see Dr. Truett Perna and to receive treatment with bevacizumab and 5FU/LV.  All of her labs were appropriate for treatment, and the physical assessment revealed no impediments to treatment. Per Dr. Truett Perna, she has no clinically significant lab abnormalities.  Her ECOG performance status remains 1.   Entered information into BRACKET, received treatment assignment and provided the pharmacy with a copy.  Spoke with Ceasar Mons, RN in the infusion area about the assigned treatment for today and gave her a sign with the order and duration of drugs.  Emphasized the need to document the normal saline flush after the bevacizumab.  Provided Natalie Mccarthy with copies of her signed consent forms that were completed on 09/14/12.

## 2012-09-27 NOTE — Patient Instructions (Addendum)
[facility] Discharge Instructions for Patients Receiving Chemotherapy  Today you received the following chemotherapy agents Avastin, Leucovorin and 5-FU.  To help prevent nausea and vomiting after your treatment, we encourage you to take your nausea medication as ordered per MD.    If you develop nausea and vomiting that is not controlled by your nausea medication, call the clinic. If it is after clinic hours your family physician or the after hours number for the clinic or go to the Emergency Department.   BELOW ARE SYMPTOMS THAT SHOULD BE REPORTED IMMEDIATELY:  *FEVER GREATER THAN 100.5 F  *CHILLS WITH OR WITHOUT FEVER  NAUSEA AND VOMITING THAT IS NOT CONTROLLED WITH YOUR NAUSEA MEDICATION  *UNUSUAL SHORTNESS OF BREATH  *UNUSUAL BRUISING OR BLEEDING  TENDERNESS IN MOUTH AND THROAT WITH OR WITHOUT PRESENCE OF ULCERS  *URINARY PROBLEMS  *BOWEL PROBLEMS  UNUSUAL RASH Items with * indicate a potential emergency and should be followed up as soon as possible.   Please let the nurse know about any problems that you may have experienced. Feel free to call the clinic you have any questions or concerns. The clinic phone number is [phone].   I have been informed and understand all the instructions given to me. I know to contact the clinic, my physician, or go to the Emergency Department if any problems should occur. I do not have any questions at this time, but understand that I may call the clinic during office hours   should I have any questions or need assistance in obtaining follow up care.    __________________________________________  _____________  __________ Signature of Patient or Authorized Representative            Date                   Time    __________________________________________ Nurse's Signature

## 2012-09-27 NOTE — Telephone Encounter (Signed)
appts made and printed for pt pt aware that mw will add chemo and an email has been sent to her Also pt aware that cen/sch will call with the ct appt and contrast was given

## 2012-09-27 NOTE — Telephone Encounter (Signed)
Pr staff message and {POF I have scheduled appt.  JMW  

## 2012-09-27 NOTE — Progress Notes (Signed)
[facility]    OFFICE PROGRESS NOTE   INTERVAL HISTORY:   She returns as scheduled. She completed another cycle of chemotherapy on 09/14/2012. She reports mild nausea for several days following chemotherapy. No emesis. No mouth sores, diarrhea, or symptoms of thrombosis. No bleeding. The right shoulder pain has partially improved.  Objective:  Vital signs in last 24 hours:  Blood pressure 129/70, pulse 74, temperature 97.6 F (36.4 C), temperature source Oral, resp. rate 20, height 5\' 8"  (1.727 m), weight 153 lb 3.2 oz (69.491 kg).    HEENT: No thrush or ulcers Resp: Lungs clear bilaterally Cardio: Regular rate and rhythm GI: No hepatosplenomegaly, nontender Vascular: No leg edema  Skin: Palms without erythema   Portacath/PICC-without erythema  Lab Results:  Lab Results  Component Value Date   WBC 3.8* 09/27/2012   HGB 13.9 09/27/2012   HCT 39.7 09/27/2012   MCV 90.8 09/27/2012   PLT 107* 09/27/2012   ANC 2.6   Medications: I have reviewed the patient's current medications.  Assessment/Plan: 1.Metastatic colon cancer with multiple liver metastases noted on an MRI of the abdomen 12/02/2010 and on a CT 12/13/2010. A transverse colon mass was noted on the CT from 02/11/2011 and confirmed on a colonoscopy 12/17/2010. She began treatment with FOLFIRI/Avastin 12/31/2010. A restaging CT 06/20/2011 confirmed further improvement in the metastatic liver lesions and no evidence for progressive metastatic disease. A restaging CT 08/01/2011 showed stable liver lesions and no new lesions. Restaging CT 09/12/2011 showed stable liver lesions and no new lesions. A restaging CT 10/27/2011 showed stable liver lesions and no new lesions. Restaging CT 12/05/2011 showed mild decrease in size of hepatic metastases and no evidence of disease progression. Restaging CT 01/26/2012 showed stable hepatic metastases and no new lesions. Restaging CT may 5/8/ 2013 revealed a decreased  size of hepatic metastases and no new lesions. Restaging CT 04/23/2012 revealed no change in the measurable "target" lesions, a slight increase in the size of other lesions, and no new lesions. Restaging CT 06/04/2012 showed multiple hepatic metastases were grossly unchanged. Restaging CT 07/19/2012 with stable disease in the liver. Infusional 5-fluorouracil continued on a 2 week schedule. Treatment was held after the 08/03/2012 cycle due to thrombocytopenia. Restaging CT evaluation 08/30/2012 with mild increase in size of large hepatic metastases. Treatment was resumed with 5 fluorouracil and Avastin on 08/31/2012.  2. Abdominal pain secondary to hepatomegaly/liver metastases-resolved.  3. Anorexia, early satiety, and weight loss secondary to metastatic colon cancer-resolved.  4. Microcytic anemia. Resolved.  5. History of migraine headaches.  6. Status post uterine fibroid surgery.  7. Port-A-Cath placement 12/20/2010.  8. Delayed nausea following cycle 1 of FOLFIRI/Avastin. Improved with the addition of Aloxi and prophylactic Decadron beginning with cycle #2. The prophylactic Decadron was tapered. We increased the day 1 Decadron back to 20 mg.  9. Neutropenia secondary to chemotherapy. Cycle #4 was held on 02/11/2011. She subsequently received Neulasta support.  10. Intermittent fever with no apparent source for infection, likely "tumor fever" or fever related to chemotherapy. No recent fever.  11. Mucositis secondary to 5-FU, initially grade 2. Irinotecan and 5-FU were dose reduced per study guidelines.  12. History of abdominal cramping. ? Related to irinotecan, 5-FU, or potentially the colon tumor. No recent abdominal cramping.  13. Thrombocytopenia secondary to chemotherapy. Treatment has been held for thrombocytopenia per study guidelines. The thrombocytopenia may be related to Avastin, or chronic liver disease from chemotherapy. The CT on 04/23/2012 confirmed mild splenomegaly. The CT  on  06/04/2012 again showed splenomegaly. The thrombocytopenia persisted despite being off of irinotecan and Avastin for several months. The platelet count has improved. 14. Acne-like rash on the face. Question related to steroids. Resolved.  15. Arthralgias-most likely unrelated to the colon cancer diagnosis and chemotherapy. She has been evaluated by orthopedics. The etiology of the arthralgias is unclear.  16. Persistent discomfort at the right shoulder. Negative CT of the right upper extremity on 07/19/2012. MRI on 08/06/2012 with a partial thickness bursal surface tear of the supraspinatus tendon, moderate rotator cuff tendinopathy/tendinosis. She is now followed by orthopedics. The pain is better since starting a physical therapy program   Disposition:  Her overall status appears unchanged. Her ECoG performance status is measured at 0-1 today. We will add Zofran to the chemotherapy regimen to treat the mild nausea. She is scheduled for restaging CT scans on 10/14/2012. Ms. Avery will return for an office visit on 10/19/2012.   Thornton Papas, MD  09/27/2012  9:14 AM

## 2012-09-29 ENCOUNTER — Ambulatory Visit (HOSPITAL_BASED_OUTPATIENT_CLINIC_OR_DEPARTMENT_OTHER): Payer: BC Managed Care – PPO

## 2012-09-29 VITALS — BP 127/79 | HR 73 | Temp 97.6°F

## 2012-09-29 DIAGNOSIS — C787 Secondary malignant neoplasm of liver and intrahepatic bile duct: Secondary | ICD-10-CM

## 2012-09-29 DIAGNOSIS — C189 Malignant neoplasm of colon, unspecified: Secondary | ICD-10-CM

## 2012-09-29 DIAGNOSIS — C184 Malignant neoplasm of transverse colon: Secondary | ICD-10-CM

## 2012-09-29 MED ORDER — HEPARIN SOD (PORK) LOCK FLUSH 100 UNIT/ML IV SOLN
500.0000 [IU] | Freq: Once | INTRAVENOUS | Status: AC | PRN
  Administered 2012-09-29: 500 [IU]
  Filled 2012-09-29: qty 5

## 2012-09-29 MED ORDER — SODIUM CHLORIDE 0.9 % IJ SOLN
10.0000 mL | INTRAMUSCULAR | Status: DC | PRN
  Administered 2012-09-29: 10 mL
  Filled 2012-09-29: qty 10

## 2012-09-29 NOTE — Patient Instructions (Signed)
Call MD for problems 

## 2012-10-13 ENCOUNTER — Encounter: Payer: Self-pay | Admitting: *Deleted

## 2012-10-14 ENCOUNTER — Ambulatory Visit (HOSPITAL_COMMUNITY)
Admission: RE | Admit: 2012-10-14 | Discharge: 2012-10-14 | Disposition: A | Discharge status: Home / Self Care | Payer: BC Managed Care – PPO | Source: Ambulatory Visit | Attending: Oncology | Admitting: Oncology

## 2012-10-14 DIAGNOSIS — C189 Malignant neoplasm of colon, unspecified: Secondary | ICD-10-CM | POA: Insufficient documentation

## 2012-10-14 DIAGNOSIS — Z79899 Other long term (current) drug therapy: Secondary | ICD-10-CM | POA: Insufficient documentation

## 2012-10-14 DIAGNOSIS — C787 Secondary malignant neoplasm of liver and intrahepatic bile duct: Secondary | ICD-10-CM | POA: Insufficient documentation

## 2012-10-15 ENCOUNTER — Telehealth: Payer: Self-pay | Admitting: *Deleted

## 2012-10-15 NOTE — Telephone Encounter (Signed)
Message copied by Caleb Popp on Fri Oct 15, 2012 11:08 AM ------      Message from: Ladene Artist      Created: Fri Oct 15, 2012  7:16 AM       Please call patient, CT is stable, f/u as scheduled

## 2012-10-15 NOTE — Telephone Encounter (Signed)
Called pt, informed her that CT is stable. She voiced understanding.

## 2012-10-17 ENCOUNTER — Other Ambulatory Visit: Payer: Self-pay | Admitting: Oncology

## 2012-10-18 ENCOUNTER — Other Ambulatory Visit: Payer: Self-pay | Admitting: *Deleted

## 2012-10-18 DIAGNOSIS — C189 Malignant neoplasm of colon, unspecified: Secondary | ICD-10-CM

## 2012-10-19 ENCOUNTER — Ambulatory Visit (HOSPITAL_BASED_OUTPATIENT_CLINIC_OR_DEPARTMENT_OTHER): Payer: BC Managed Care – PPO | Admitting: Oncology

## 2012-10-19 ENCOUNTER — Ambulatory Visit (HOSPITAL_BASED_OUTPATIENT_CLINIC_OR_DEPARTMENT_OTHER): Payer: BC Managed Care – PPO

## 2012-10-19 ENCOUNTER — Encounter: Payer: Self-pay | Admitting: *Deleted

## 2012-10-19 ENCOUNTER — Telehealth: Payer: Self-pay | Admitting: *Deleted

## 2012-10-19 ENCOUNTER — Other Ambulatory Visit (HOSPITAL_BASED_OUTPATIENT_CLINIC_OR_DEPARTMENT_OTHER): Payer: BC Managed Care – PPO | Admitting: Lab

## 2012-10-19 ENCOUNTER — Telehealth: Payer: Self-pay | Admitting: Oncology

## 2012-10-19 VITALS — BP 125/76 | HR 80 | Temp 96.8°F | Resp 20 | Ht 68.0 in | Wt 155.5 lb

## 2012-10-19 DIAGNOSIS — C184 Malignant neoplasm of transverse colon: Secondary | ICD-10-CM

## 2012-10-19 DIAGNOSIS — C189 Malignant neoplasm of colon, unspecified: Secondary | ICD-10-CM

## 2012-10-19 DIAGNOSIS — C787 Secondary malignant neoplasm of liver and intrahepatic bile duct: Secondary | ICD-10-CM

## 2012-10-19 DIAGNOSIS — Z5111 Encounter for antineoplastic chemotherapy: Secondary | ICD-10-CM

## 2012-10-19 LAB — URINALYSIS, MICROSCOPIC - CHCC
Bilirubin (Urine): NEGATIVE
Blood: NEGATIVE
Glucose: NEGATIVE g/dL
Specific Gravity, Urine: 1.03 (ref 1.003–1.035)
pH: 5 (ref 4.6–8.0)

## 2012-10-19 LAB — CBC WITH DIFFERENTIAL/PLATELET
Basophils Absolute: 0 10*3/uL (ref 0.0–0.1)
Eosinophils Absolute: 0.1 10*3/uL (ref 0.0–0.5)
HGB: 14.3 g/dL (ref 11.6–15.9)
LYMPH%: 16.4 % (ref 14.0–49.7)
MCV: 91 fL (ref 79.5–101.0)
MONO#: 0.4 10*3/uL (ref 0.1–0.9)
MONO%: 10.8 % (ref 0.0–14.0)
NEUT#: 2.7 10*3/uL (ref 1.5–6.5)
Platelets: 92 10*3/uL — ABNORMAL LOW (ref 145–400)
RBC: 4.6 10*6/uL (ref 3.70–5.45)
RDW: 16.4 % — ABNORMAL HIGH (ref 11.2–14.5)
WBC: 3.9 10*3/uL (ref 3.9–10.3)

## 2012-10-19 LAB — COMPREHENSIVE METABOLIC PANEL (CC13)
ALT: 25 U/L (ref 0–55)
AST: 30 U/L (ref 5–34)
Albumin: 3.8 g/dL (ref 3.5–5.0)
Alkaline Phosphatase: 123 U/L (ref 40–150)
Calcium: 9.8 mg/dL (ref 8.4–10.4)
Chloride: 104 mEq/L (ref 98–107)
Potassium: 4.5 mEq/L (ref 3.5–5.1)
Sodium: 139 mEq/L (ref 136–145)
Total Protein: 7.2 g/dL (ref 6.4–8.3)

## 2012-10-19 MED ORDER — LEUCOVORIN CALCIUM INJECTION 350 MG
400.0000 mg/m2 | Freq: Once | INTRAVENOUS | Status: AC
  Administered 2012-10-19: 728 mg via INTRAVENOUS
  Filled 2012-10-19: qty 36.4

## 2012-10-19 MED ORDER — FLUOROURACIL CHEMO INJECTION 500 MG/10ML
230.0000 mg/m2 | Freq: Once | INTRAVENOUS | Status: AC
  Administered 2012-10-19: 400 mg via INTRAVENOUS
  Filled 2012-10-19: qty 8

## 2012-10-19 MED ORDER — SODIUM CHLORIDE 0.9 % IV SOLN
Freq: Once | INTRAVENOUS | Status: AC
  Administered 2012-10-19: 12:00:00 via INTRAVENOUS

## 2012-10-19 MED ORDER — ONDANSETRON 8 MG/50ML IVPB (CHCC)
8.0000 mg | Freq: Once | INTRAVENOUS | Status: AC
  Administered 2012-10-19: 8 mg via INTRAVENOUS

## 2012-10-19 MED ORDER — SODIUM CHLORIDE 0.9 % IV SOLN
5.0000 mg/kg | Freq: Once | INTRAVENOUS | Status: AC
  Administered 2012-10-19: 325 mg via INTRAVENOUS
  Filled 2012-10-19: qty 13

## 2012-10-19 MED ORDER — HEPARIN SOD (PORK) LOCK FLUSH 100 UNIT/ML IV SOLN
500.0000 [IU] | Freq: Once | INTRAVENOUS | Status: DC | PRN
  Filled 2012-10-19: qty 5

## 2012-10-19 MED ORDER — SODIUM CHLORIDE 0.9 % IV SOLN
1360.0000 mg/m2 | INTRAVENOUS | Status: DC
  Administered 2012-10-19: 2500 mg via INTRAVENOUS
  Filled 2012-10-19: qty 50

## 2012-10-19 MED ORDER — SODIUM CHLORIDE 0.9 % IJ SOLN
10.0000 mL | INTRAMUSCULAR | Status: DC | PRN
  Filled 2012-10-19: qty 10

## 2012-10-19 NOTE — Telephone Encounter (Signed)
Per staff message and POF I have scheduled appt.  JMW  

## 2012-10-19 NOTE — Patient Instructions (Addendum)
[facility] Discharge Instructions for Patients Receiving Chemotherapy  Today you received the following chemotherapy agents avastin/folfori  To help prevent nausea and vomiting after your treatment, we encourage you to take your nausea medication  and take it as often as prescribed  If you develop nausea and vomiting that is not controlled by your nausea medication, call the clinic. If it is after clinic hours your family physician or the after hours number for the clinic or go to the Emergency Department.   BELOW ARE SYMPTOMS THAT SHOULD BE REPORTED IMMEDIATELY:  *FEVER GREATER THAN 100.5 F  *CHILLS WITH OR WITHOUT FEVER  NAUSEA AND VOMITING THAT IS NOT CONTROLLED WITH YOUR NAUSEA MEDICATION  *UNUSUAL SHORTNESS OF BREATH  *UNUSUAL BRUISING OR BLEEDING  TENDERNESS IN MOUTH AND THROAT WITH OR WITHOUT PRESENCE OF ULCERS  *URINARY PROBLEMS  *BOWEL PROBLEMS  UNUSUAL RASH Items with * indicate a potential emergency and should be followed up as soon as possible.  One of the nurses will contact you 24 hours after your treatment. Please let the nurse know about any problems that you may have experienced. Feel free to call the clinic you have any questions or concerns. The clinic phone number is [phone].   I have been informed and understand all the instructions given to me. I know to contact the clinic, my physician, or go to the Emergency Department if any problems should occur. I do not have any questions at this time, but understand that I may call the clinic during office hours   should I have any questions or need assistance in obtaining follow up care.    __________________________________________  _____________  __________ Signature of Patient or Authorized Representative            Date                   Time    __________________________________________ Nurse's Signature

## 2012-10-19 NOTE — Progress Notes (Signed)
Dec. 17, 2013.  [name], cycle 51 Natalie Mccarthy is in the cancer center today for lab work, physical exam, and cycle 41 on the [name] study. She reports she has continuing shoulder pain. The nausea was less after the last treatment.   Lab results are WNL except for a platelet count of 92,000. Dr. Truett Perna does not consider that to be clinically significant and wants top proceed with treatment today with 5FU, Leucovorin, and Avastin at the same dose levels given the last cycle. RMR completed and emailed to the [facility] support center.   10/19/12. Discussion by phone with the medical monitor. Patient may precede with treatment on study.

## 2012-10-19 NOTE — Telephone Encounter (Signed)
gv and printed appt schedule for pt for Jan 2014...emailed michelle to add chemo...the patient aware °

## 2012-10-19 NOTE — Progress Notes (Signed)
[facility]    OFFICE PROGRESS NOTE   INTERVAL HISTORY:   She returns as scheduled. She completed another cycle of 5-fluorouracil/Avastin on 09/27/2012. No mouth sores, nausea, or diarrhea. She reports the bowel habits have been irregular since resuming Avastin. Occasional bleeding when she blows her nose. No other bleeding. No abdominal pain. The right shoulder discomfort is unchanged.  Objective:  Vital signs in last 24 hours:  Blood pressure 125/76, pulse 80, temperature 96.8 F (36 C), temperature source Oral, resp. rate 20, height 5\' 8"  (1.727 m), weight 155 lb 8 oz (70.534 kg).    HEENT: No thrush or ulcers Resp: Lungs clear bilaterally Cardio: Regular rate and rhythm GI: No hepatosplenomegaly, no mass, nontender Vascular: No leg edema  Portacath/PICC-without erythema  Lab Results:  Lab Results  Component Value Date   WBC 3.9 10/19/2012   HGB 14.3 10/19/2012   HCT 41.9 10/19/2012   MCV 91.0 10/19/2012   PLT 92* 10/19/2012   ANC 2.7  X-rays: CT abdomen 10/14/2012, compared to 08/30/2012-multiple hepatic metastases are grossly unchanged.  Medications: I have reviewed the patient's current medications.  Assessment/Plan: 1.Metastatic colon cancer with multiple liver metastases noted on an MRI of the abdomen 12/02/2010 and on a CT 12/13/2010. A transverse colon mass was noted on the CT from 02/11/2011 and confirmed on a colonoscopy 12/17/2010. She began treatment with FOLFIRI/Avastin 12/31/2010. A restaging CT 06/20/2011 confirmed further improvement in the metastatic liver lesions and no evidence for progressive metastatic disease. A restaging CT 08/01/2011 showed stable liver lesions and no new lesions. Restaging CT 09/12/2011 showed stable liver lesions and no new lesions. A restaging CT 10/27/2011 showed stable liver lesions and no new lesions. Restaging CT 12/05/2011 showed mild decrease in size of hepatic metastases and no evidence of disease  progression. Restaging CT 01/26/2012 showed stable hepatic metastases and no new lesions. Restaging CT may 5/8/ 2013 revealed a decreased size of hepatic metastases and no new lesions. Restaging CT 04/23/2012 revealed no change in the measurable "target" lesions, a slight increase in the size of other lesions, and no new lesions. Restaging CT 06/04/2012 showed multiple hepatic metastases were grossly unchanged. Restaging CT 07/19/2012 with stable disease in the liver. Infusional 5-fluorouracil continued on a 2 week schedule. Treatment was held after the 08/03/2012 cycle due to thrombocytopenia. Restaging CT evaluation 08/30/2012 with mild increase in size of large hepatic metastases. Treatment was resumed with 5 fluorouracil and Avastin on 08/31/2012. Restaging CT 10/14/2012-stable hepatic metastases. 2. Abdominal pain secondary to hepatomegaly/liver metastases-resolved.  3. Anorexia, early satiety, and weight loss secondary to metastatic colon cancer-resolved.  4. Microcytic anemia. Resolved.  5. History of migraine headaches.  6. Status post uterine fibroid surgery.  7. Port-A-Cath placement 12/20/2010.  8. Delayed nausea following cycle 1 of FOLFIRI/Avastin. Improved with the addition of Aloxi and prophylactic Decadron beginning with cycle #2. The prophylactic Decadron was tapered. We increased the day 1 Decadron back to 20 mg.  9. Neutropenia secondary to chemotherapy. Cycle #4 was held on 02/11/2011. She subsequently received Neulasta support.  10. Intermittent fever with no apparent source for infection, likely "tumor fever" or fever related to chemotherapy. No recent fever.  11. Mucositis secondary to 5-FU, initially grade 2. Irinotecan and 5-FU were dose reduced per study guidelines.  12. History of abdominal cramping. ? Related to irinotecan, 5-FU, or potentially the colon tumor. No recent abdominal cramping.  13. Thrombocytopenia secondary to chemotherapy. Treatment has been held for  thrombocytopenia per study guidelines. The  thrombocytopenia may be related to Avastin, or chronic liver disease from chemotherapy. The CT on 04/23/2012 confirmed mild splenomegaly. The CT on 06/04/2012 again showed splenomegaly. The thrombocytopenia persisted despite being off of irinotecan and Avastin for several months. The platelet count has improved.  14. Acne-like rash on the face. Question related to steroids. Resolved.  15. Arthralgias-most likely unrelated to the colon cancer diagnosis and chemotherapy. She has been evaluated by orthopedics. The etiology of the arthralgias is unclear.  16. Persistent discomfort at the right shoulder. Negative CT of the right upper extremity on 07/19/2012. MRI on 08/06/2012 with a partial thickness bursal surface tear of the supraspinatus tendon, moderate rotator cuff tendinopathy/tendinosis. She is now followed by orthopedics. The pain stable today.  Disposition:  She appears unchanged. No clinical or x-ray evidence of disease progression. The plan is to continue 5-FU/Avastin. The next chemotherapy will be scheduled for January 7 secondary to the holidays. Her ECoG performance status is measured at 0-1 today.   Thornton Papas, MD  10/19/2012  10:49 AM

## 2012-10-21 ENCOUNTER — Ambulatory Visit (HOSPITAL_BASED_OUTPATIENT_CLINIC_OR_DEPARTMENT_OTHER): Payer: BC Managed Care – PPO

## 2012-10-21 VITALS — BP 123/85 | HR 95 | Temp 97.5°F

## 2012-10-21 DIAGNOSIS — Z452 Encounter for adjustment and management of vascular access device: Secondary | ICD-10-CM

## 2012-10-21 DIAGNOSIS — C189 Malignant neoplasm of colon, unspecified: Secondary | ICD-10-CM

## 2012-10-21 DIAGNOSIS — C787 Secondary malignant neoplasm of liver and intrahepatic bile duct: Secondary | ICD-10-CM

## 2012-10-21 DIAGNOSIS — C184 Malignant neoplasm of transverse colon: Secondary | ICD-10-CM

## 2012-10-21 MED ORDER — SODIUM CHLORIDE 0.9 % IJ SOLN
10.0000 mL | INTRAMUSCULAR | Status: DC | PRN
  Administered 2012-10-21: 10 mL
  Filled 2012-10-21: qty 10

## 2012-10-21 MED ORDER — HEPARIN SOD (PORK) LOCK FLUSH 100 UNIT/ML IV SOLN
500.0000 [IU] | Freq: Once | INTRAVENOUS | Status: AC | PRN
  Administered 2012-10-21: 500 [IU]
  Filled 2012-10-21: qty 5

## 2012-11-01 ENCOUNTER — Telehealth: Payer: Self-pay | Admitting: *Deleted

## 2012-11-01 ENCOUNTER — Other Ambulatory Visit (HOSPITAL_BASED_OUTPATIENT_CLINIC_OR_DEPARTMENT_OTHER): Payer: BC Managed Care – PPO | Admitting: Lab

## 2012-11-01 DIAGNOSIS — N390 Urinary tract infection, site not specified: Secondary | ICD-10-CM

## 2012-11-01 LAB — URINALYSIS, MICROSCOPIC - CHCC
Bilirubin (Urine): NEGATIVE
Glucose: NEGATIVE g/dL
Nitrite: NEGATIVE
Specific Gravity, Urine: 1.01 (ref 1.003–1.035)

## 2012-11-01 MED ORDER — CIPROFLOXACIN HCL 500 MG PO TABS: 500.0000 mg | ORAL_TABLET | Freq: Two times a day (BID) | ORAL | Status: DC

## 2012-11-01 NOTE — Telephone Encounter (Signed)
Message copied by Wandalee Ferdinand on Mon Nov 01, 2012  4:14 PM ------      Message from: [place], [place] K      Created: Mon Nov 01, 2012  3:43 PM       Begin Cipro as prescribed.       ----- Message -----         From: [facility]         Sent: 11/01/2012   3:21 PM           To: Rana Snare, NP

## 2012-11-01 NOTE — Telephone Encounter (Signed)
Reports urinary frequency and sensation that she still needs to void after stream stops. Wiped today and found blood on tissue and is sure it is from urine and not bowel or vaginal. No fever. Per Lonna Cobb, NP.Marland Kitchenwill come in for U/A and culture today and call in Cipro twice daily x 5 days. She agrees to plan.

## 2012-11-01 NOTE — Telephone Encounter (Signed)
Called back and instructed husband to have Kevyn start her Cipro tonight. U/A shows UTI. Culture is pending.

## 2012-11-04 ENCOUNTER — Other Ambulatory Visit: Payer: Self-pay | Admitting: *Deleted

## 2012-11-04 DIAGNOSIS — C189 Malignant neoplasm of colon, unspecified: Secondary | ICD-10-CM

## 2012-11-06 ENCOUNTER — Other Ambulatory Visit: Payer: Self-pay | Admitting: Oncology

## 2012-11-08 ENCOUNTER — Other Ambulatory Visit: Payer: Self-pay | Admitting: *Deleted

## 2012-11-09 ENCOUNTER — Telehealth: Payer: Self-pay | Admitting: *Deleted

## 2012-11-09 ENCOUNTER — Ambulatory Visit (HOSPITAL_BASED_OUTPATIENT_CLINIC_OR_DEPARTMENT_OTHER): Payer: BC Managed Care – PPO

## 2012-11-09 ENCOUNTER — Telehealth: Payer: Self-pay | Admitting: Oncology

## 2012-11-09 ENCOUNTER — Other Ambulatory Visit (HOSPITAL_BASED_OUTPATIENT_CLINIC_OR_DEPARTMENT_OTHER): Payer: BC Managed Care – PPO | Admitting: Lab

## 2012-11-09 ENCOUNTER — Ambulatory Visit (HOSPITAL_BASED_OUTPATIENT_CLINIC_OR_DEPARTMENT_OTHER): Payer: BC Managed Care – PPO | Admitting: Nurse Practitioner

## 2012-11-09 ENCOUNTER — Encounter: Payer: Self-pay | Admitting: *Deleted

## 2012-11-09 VITALS — BP 122/79 | HR 75

## 2012-11-09 VITALS — BP 120/72 | HR 74 | Temp 96.8°F | Resp 20 | Ht 68.0 in | Wt 155.6 lb

## 2012-11-09 DIAGNOSIS — C184 Malignant neoplasm of transverse colon: Secondary | ICD-10-CM

## 2012-11-09 DIAGNOSIS — C787 Secondary malignant neoplasm of liver and intrahepatic bile duct: Secondary | ICD-10-CM

## 2012-11-09 DIAGNOSIS — Z5111 Encounter for antineoplastic chemotherapy: Secondary | ICD-10-CM

## 2012-11-09 DIAGNOSIS — C189 Malignant neoplasm of colon, unspecified: Secondary | ICD-10-CM

## 2012-11-09 LAB — URINALYSIS, MICROSCOPIC - CHCC
Bilirubin (Urine): NEGATIVE
Glucose: NEGATIVE g/dL
Ketones: NEGATIVE mg/dL
Leukocyte Esterase: NEGATIVE

## 2012-11-09 LAB — CBC WITH DIFFERENTIAL/PLATELET
BASO%: 0.7 % (ref 0.0–2.0)
EOS%: 2.2 % (ref 0.0–7.0)
MCH: 32.3 pg (ref 25.1–34.0)
MCV: 91.9 fL (ref 79.5–101.0)
MONO%: 13 % (ref 0.0–14.0)
RBC: 4.34 10*6/uL (ref 3.70–5.45)
RDW: 15.6 % — ABNORMAL HIGH (ref 11.2–14.5)
lymph#: 0.4 10*3/uL — ABNORMAL LOW (ref 0.9–3.3)

## 2012-11-09 LAB — COMPREHENSIVE METABOLIC PANEL (CC13)
ALT: 21 U/L (ref 0–55)
AST: 30 U/L (ref 5–34)
Alkaline Phosphatase: 135 U/L (ref 40–150)
Glucose: 94 mg/dl (ref 70–99)
Sodium: 139 mEq/L (ref 136–145)
Total Bilirubin: 0.97 mg/dL (ref 0.20–1.20)
Total Protein: 7.2 g/dL (ref 6.4–8.3)

## 2012-11-09 MED ORDER — SODIUM CHLORIDE 0.9 % IV SOLN
5.0000 mg/kg | Freq: Once | INTRAVENOUS | Status: AC
  Administered 2012-11-09: 325 mg via INTRAVENOUS
  Filled 2012-11-09: qty 13

## 2012-11-09 MED ORDER — SODIUM CHLORIDE 0.9 % IV SOLN
1360.0000 mg/m2 | INTRAVENOUS | Status: DC
  Administered 2012-11-09: 2500 mg via INTRAVENOUS
  Filled 2012-11-09: qty 50

## 2012-11-09 MED ORDER — FLUOROURACIL CHEMO INJECTION 500 MG/10ML
230.0000 mg/m2 | Freq: Once | INTRAVENOUS | Status: AC
  Administered 2012-11-09: 400 mg via INTRAVENOUS
  Filled 2012-11-09: qty 8

## 2012-11-09 MED ORDER — SODIUM CHLORIDE 0.9 % IV SOLN
Freq: Once | INTRAVENOUS | Status: AC
  Administered 2012-11-09: 13:00:00 via INTRAVENOUS

## 2012-11-09 MED ORDER — LEUCOVORIN CALCIUM INJECTION 350 MG
400.0000 mg/m2 | Freq: Once | INTRAVENOUS | Status: AC
  Administered 2012-11-09: 728 mg via INTRAVENOUS
  Filled 2012-11-09: qty 36.4

## 2012-11-09 MED ORDER — SODIUM CHLORIDE 0.9 % IV SOLN
Freq: Once | INTRAVENOUS | Status: AC
  Administered 2012-11-09: 14:00:00 via INTRAVENOUS

## 2012-11-09 MED ORDER — ONDANSETRON 8 MG/50ML IVPB (CHCC)
8.0000 mg | Freq: Once | INTRAVENOUS | Status: AC
  Administered 2012-11-09: 8 mg via INTRAVENOUS

## 2012-11-09 NOTE — Progress Notes (Signed)
OFFICE PROGRESS NOTE  Interval history:  Ms. Mielke returns as scheduled. She was last treated with 5-FU/leucovorin and Avastin on 10/19/2012. She had mild nausea for a few days after the treatment. No vomiting. No mouth sores. No diarrhea. She occasionally notes blood when she blows her nose. No other bleeding. She denies abdominal pain. No shortness of breath or chest pain. No leg swelling or calf pain. She continues to have discomfort at the right shoulder. She notes that she fatigues easily.  She completed a course of ciprofloxacin for a urinary tract infection beginning 11/01/2012. Urinary symptoms have resolved. The culture returned negative.   Objective: Blood pressure 120/72, pulse 74, temperature 96.8 F (36 C), temperature source Oral, resp. rate 20, height 5\' 8"  (1.727 m), weight 155 lb 9.6 oz (70.58 kg).  Oropharynx is without thrush or ulceration. Lungs are clear. Regular cardiac rhythm. Port-A-Cath site is without erythema. Abdomen is soft and nontender. No hepatomegaly. Extremities are without edema. Calves are soft and nontender.  Lab Results: Lab Results  Component Value Date   WBC 4.6 11/09/2012   HGB 14.0 11/09/2012   HCT 39.9 11/09/2012   MCV 91.9 11/09/2012   PLT 82* 11/09/2012    Chemistry:    Chemistry      Component Value Date/Time   NA 139 10/19/2012 1013   NA 139 06/22/2012 1108   NA 143 01/27/2012 1354   K 4.5 10/19/2012 1013   K 3.8 06/22/2012 1108   K 3.8 01/27/2012 1354   CL 104 10/19/2012 1013   CL 103 06/22/2012 1108   CL 97* 01/27/2012 1354   CO2 29 10/19/2012 1013   CO2 31 06/22/2012 1108   CO2 29 01/27/2012 1354   BUN 20.0 10/19/2012 1013   BUN 19 06/22/2012 1108   BUN 11 01/27/2012 1354   CREATININE 0.8 10/19/2012 1013   CREATININE 0.75 06/22/2012 1108   CREATININE 0.9 01/27/2012 1354      Component Value Date/Time   CALCIUM 9.8 10/19/2012 1013   CALCIUM 9.7 06/22/2012 1108   CALCIUM 8.9 01/27/2012 1354   ALKPHOS 123 10/19/2012 1013   ALKPHOS 185*  06/22/2012 1108   ALKPHOS 198* 01/27/2012 1354   AST 30 10/19/2012 1013   AST 36 06/22/2012 1108   AST 34 01/27/2012 1354   ALT 25 10/19/2012 1013   ALT 36* 06/22/2012 1108   BILITOT 0.88 10/19/2012 1013   BILITOT 0.5 06/22/2012 1108   BILITOT 0.50 01/27/2012 1354       Studies/Results: Ct Abdomen W Contrast  10/14/2012  *RADIOLOGY REPORT*  Clinical Data: Metastatic colon cancer diagnosed 2012, chemotherapy in progress  CT ABDOMEN WITH CONTRAST  Technique:  Multidetector CT imaging of the abdomen was performed following the standard protocol during bolus administration of intravenous contrast.  Contrast:  100 ml Omnipaque-300 IV  Comparison: 08/30/2012  ---  RECIST 1.1  Target Lesions:  1. Lesion at junction of right and left hepatic lobes - 42 mm (series 2/image 14), unchanged  2. Lateral segment left hepatic lobe lesion - 20 mm (series 2/image 14), unchanged  Non-target Lesions:  1. Posterior segment right hepatic lobe lesion - present (series 2/image 11)  ---  Findings: Lung bases are clear.  Multiple hepatic metastases, grossly unchanged, including: --4.1 x 3.0 cm lesion in the right hepatic dome (series/ image 8), previously 4.4 x 2.8 cm --4.4 x 5.6 cm lesion at the junction of the right and left hepatic lobe (series 2/image 11), previously 4.3 x 5.8 cm --2.8 x  1.7 cm lesion in the posterior right dome ( series 2/image 11), previously 2.8 x 2.2 cm --1.5 x 2.0 cm lesion in the lateral segment left hepatic lobe (series 2/image 14), unchanged  Spleen, pancreas, and adrenal glands are within normal limits.  Gallbladder is unremarkable.  No intrahepatic or extrahepatic ductal dilatation.  Kidneys are within normal limits.  No hydronephrosis.  Visualized bowels unremarkable.  No abdominal ascites.  No suspicious abdominal lymphadenopathy.  Visualized osseous structures are within normal limits.  IMPRESSION: Multiple hepatic metastases, grossly unchanged, as described above.  RECIST 1.1 measurements as  above.  Please note that the pelvis was not imaged.   Original Report Authenticated By: Charline Bills, M.D.     Medications: I have reviewed the patient's current medications.  Assessment/Plan:  1.Metastatic colon cancer with multiple liver metastases noted on an MRI of the abdomen 12/02/2010 and on a CT 12/13/2010. A transverse colon mass was noted on the CT from 02/11/2011 and confirmed on a colonoscopy 12/17/2010. She began treatment with FOLFIRI/Avastin 12/31/2010. A restaging CT 06/20/2011 confirmed further improvement in the metastatic liver lesions and no evidence for progressive metastatic disease. A restaging CT 08/01/2011 showed stable liver lesions and no new lesions. Restaging CT 09/12/2011 showed stable liver lesions and no new lesions. A restaging CT 10/27/2011 showed stable liver lesions and no new lesions. Restaging CT 12/05/2011 showed mild decrease in size of hepatic metastases and no evidence of disease progression. Restaging CT 01/26/2012 showed stable hepatic metastases and no new lesions. Restaging CT may 5/8/ 2013 revealed a decreased size of hepatic metastases and no new lesions. Restaging CT 04/23/2012 revealed no change in the measurable "target" lesions, a slight increase in the size of other lesions, and no new lesions. Restaging CT 06/04/2012 showed multiple hepatic metastases were grossly unchanged. Restaging CT 07/19/2012 with stable disease in the liver. Infusional 5-fluorouracil continued on a 2 week schedule. Treatment was held after the 08/03/2012 cycle due to thrombocytopenia. Restaging CT evaluation 08/30/2012 with mild increase in size of large hepatic metastases. Treatment was resumed with 5 fluorouracil and Avastin on 08/31/2012. Restaging CT 10/14/2012-stable hepatic metastases. Treatment continued with 5 fluorouracil and Avastin. 2. Abdominal pain secondary to hepatomegaly/liver metastases-resolved.  3. Anorexia, early satiety, and weight loss secondary to  metastatic colon cancer-resolved.  4. Microcytic anemia. Resolved.  5. History of migraine headaches.  6. Status post uterine fibroid surgery.  7. Port-A-Cath placement 12/20/2010.  8. Delayed nausea following cycle 1 of FOLFIRI/Avastin. Improved with the addition of Aloxi and prophylactic Decadron beginning with cycle #2. The prophylactic Decadron was tapered. We increased the day 1 Decadron back to 20 mg.  9. Neutropenia secondary to chemotherapy. Cycle #4 was held on 02/11/2011. She subsequently received Neulasta support.  10. Intermittent fever with no apparent source for infection, likely "tumor fever" or fever related to chemotherapy. No recent fever.  11. Mucositis secondary to 5-FU, initially grade 2. Irinotecan and 5-FU were dose reduced per study guidelines.  12. History of abdominal cramping. ? Related to irinotecan, 5-FU, or potentially the colon tumor. No recent abdominal cramping.  13. Thrombocytopenia secondary to chemotherapy. Treatment has been held for thrombocytopenia per study guidelines. The thrombocytopenia may be related to Avastin, or chronic liver disease from chemotherapy. The CT on 04/23/2012 confirmed mild splenomegaly. The CT on 06/04/2012 again showed splenomegaly. The thrombocytopenia persisted despite being off of irinotecan and Avastin for several months.  14. Acne-like rash on the face. Question related to steroids. Resolved.  15. Arthralgias-most likely unrelated  to the colon cancer diagnosis and chemotherapy. She has been evaluated by orthopedics. The etiology of the arthralgias is unclear.  16. Persistent discomfort at the right shoulder. Negative CT of the right upper extremity on 07/19/2012. MRI on 08/06/2012 with a partial thickness bursal surface tear of the supraspinatus tendon, moderate rotator cuff tendinopathy/tendinosis. She is now followed by orthopedics. The pain is stable.  Disposition-Ms. Rhines appears stable. Plan to proceed with 5-FU/Avastin today  as scheduled. She will return for a followup visit and the next cycle in 2 weeks. She will contact the office in the interim with any problems. ECoG performance status is measured at 0-1.  Plan reviewed with Dr. Truett Perna.   Lonna Cobb ANP/GNP-BC

## 2012-11-09 NOTE — Patient Instructions (Addendum)
[facility] Discharge Instructions for Patients Receiving Chemotherapy  Today you received the following chemotherapy agents : Avastin,  5FU ,  Leucovorin.  To help prevent nausea and vomiting after your treatment, we encourage you to take your nausea medication as instructed by your physician.    If you develop nausea and vomiting that is not controlled by your nausea medication, call the clinic. If it is after clinic hours your family physician or the after hours number for the clinic or go to the Emergency Department.   BELOW ARE SYMPTOMS THAT SHOULD BE REPORTED IMMEDIATELY:  *FEVER GREATER THAN 100.5 F  *CHILLS WITH OR WITHOUT FEVER  NAUSEA AND VOMITING THAT IS NOT CONTROLLED WITH YOUR NAUSEA MEDICATION  *UNUSUAL SHORTNESS OF BREATH  *UNUSUAL BRUISING OR BLEEDING  TENDERNESS IN MOUTH AND THROAT WITH OR WITHOUT PRESENCE OF ULCERS  *URINARY PROBLEMS  *BOWEL PROBLEMS  UNUSUAL RASH Items with * indicate a potential emergency and should be followed up as soon as possible.  One of the nurses will contact you 24 hours after your treatment. Please let the nurse know about any problems that you may have experienced. Feel free to call the clinic you have any questions or concerns. The clinic phone number is [phone].   I have been informed and understand all the instructions given to me. I know to contact the clinic, my physician, or go to the Emergency Department if any problems should occur. I do not have any questions at this time, but understand that I may call the clinic during office hours   should I have any questions or need assistance in obtaining follow up care.    __________________________________________  _____________  __________ Signature of Patient or Authorized Representative            Date                   Time    __________________________________________ Nurse's Signature

## 2012-11-09 NOTE — Progress Notes (Signed)
11/09/2012, [name] cycle 42 [name] is in the cancer center today for lab work, physical exam, and treatment with 5FU/LV, and bevacizumab. She was seen by L.Maisie Fus, NP. Natalie Mccarthy reports she if feeling well, her shoulder pain remains the same. She had a good holiday and was able to travel with her daughter. She did experience fatigue but it was resolved with rest.  ECOG 1.  Labs WNL for treatment except for platelet count of 82,000. Per Dr Truett Perna, he plans to proceed with treatment. RMR completed and submitted for Medical review.   11/09/2012 12:45. Phone discussion with Medical monitor for [name]. Patient may remain on study. Infusion notified to proceed with treatment.  Sign for infusion given to Richrd Prime, RN

## 2012-11-09 NOTE — Telephone Encounter (Signed)
Gave pt appt for January and February 2014 , emailed [facility] regarding chemo, pt will get oral contrast later

## 2012-11-09 NOTE — Telephone Encounter (Signed)
Talked to patient gave her chemo appt for 12/08/11

## 2012-11-09 NOTE — Telephone Encounter (Signed)
Per staff message and POF I have scheduled appts.  JMW  

## 2012-11-11 ENCOUNTER — Ambulatory Visit (HOSPITAL_BASED_OUTPATIENT_CLINIC_OR_DEPARTMENT_OTHER): Payer: BC Managed Care – PPO

## 2012-11-11 VITALS — BP 120/53 | HR 82 | Temp 97.8°F

## 2012-11-11 DIAGNOSIS — C189 Malignant neoplasm of colon, unspecified: Secondary | ICD-10-CM

## 2012-11-11 DIAGNOSIS — C787 Secondary malignant neoplasm of liver and intrahepatic bile duct: Secondary | ICD-10-CM

## 2012-11-11 DIAGNOSIS — C184 Malignant neoplasm of transverse colon: Secondary | ICD-10-CM

## 2012-11-11 MED ORDER — HEPARIN SOD (PORK) LOCK FLUSH 100 UNIT/ML IV SOLN
500.0000 [IU] | Freq: Once | INTRAVENOUS | Status: AC | PRN
  Administered 2012-11-11: 500 [IU]
  Filled 2012-11-11: qty 5

## 2012-11-11 MED ORDER — SODIUM CHLORIDE 0.9 % IJ SOLN
10.0000 mL | INTRAMUSCULAR | Status: DC | PRN
  Administered 2012-11-11: 10 mL
  Filled 2012-11-11: qty 10

## 2012-11-11 NOTE — Patient Instructions (Signed)
Call MD for problems 

## 2012-11-21 ENCOUNTER — Other Ambulatory Visit: Payer: Self-pay | Admitting: Oncology

## 2012-11-22 ENCOUNTER — Encounter: Payer: Self-pay | Admitting: *Deleted

## 2012-11-22 ENCOUNTER — Other Ambulatory Visit: Payer: Self-pay | Admitting: *Deleted

## 2012-11-22 ENCOUNTER — Other Ambulatory Visit (HOSPITAL_BASED_OUTPATIENT_CLINIC_OR_DEPARTMENT_OTHER): Payer: BC Managed Care – PPO | Admitting: Lab

## 2012-11-22 DIAGNOSIS — C189 Malignant neoplasm of colon, unspecified: Secondary | ICD-10-CM

## 2012-11-22 DIAGNOSIS — C184 Malignant neoplasm of transverse colon: Secondary | ICD-10-CM

## 2012-11-22 DIAGNOSIS — Z5111 Encounter for antineoplastic chemotherapy: Secondary | ICD-10-CM

## 2012-11-22 LAB — CBC WITH DIFFERENTIAL/PLATELET
BASO%: 0.9 % (ref 0.0–2.0)
Basophils Absolute: 0 10*3/uL (ref 0.0–0.1)
EOS%: 3.3 % (ref 0.0–7.0)
HCT: 41.2 % (ref 34.8–46.6)
HGB: 14.4 g/dL (ref 11.6–15.9)
MCH: 31 pg (ref 25.1–34.0)
MONO#: 0.6 10*3/uL (ref 0.1–0.9)
NEUT%: 71.5 % (ref 38.4–76.8)
RDW: 15.5 % — ABNORMAL HIGH (ref 11.2–14.5)
WBC: 5.3 10*3/uL (ref 3.9–10.3)
lymph#: 0.7 10*3/uL — ABNORMAL LOW (ref 0.9–3.3)

## 2012-11-22 LAB — URINALYSIS, MICROSCOPIC - CHCC
Nitrite: NEGATIVE
Protein: NEGATIVE mg/dL
RBC / HPF: NEGATIVE (ref 0–2)
pH: 5 (ref 4.6–8.0)

## 2012-11-22 LAB — COMPREHENSIVE METABOLIC PANEL (CC13)
Albumin: 3.9 g/dL (ref 3.5–5.0)
Alkaline Phosphatase: 152 U/L — ABNORMAL HIGH (ref 40–150)
BUN: 19 mg/dL (ref 7.0–26.0)
CO2: 26 mEq/L (ref 22–29)
Glucose: 94 mg/dl (ref 70–99)
Potassium: 4.4 mEq/L (ref 3.5–5.1)

## 2012-11-23 ENCOUNTER — Encounter: Payer: Self-pay | Admitting: *Deleted

## 2012-11-23 ENCOUNTER — Telehealth: Payer: Self-pay | Admitting: Oncology

## 2012-11-23 ENCOUNTER — Other Ambulatory Visit: Payer: BC Managed Care – PPO | Admitting: Lab

## 2012-11-23 ENCOUNTER — Ambulatory Visit (HOSPITAL_BASED_OUTPATIENT_CLINIC_OR_DEPARTMENT_OTHER): Payer: BC Managed Care – PPO

## 2012-11-23 ENCOUNTER — Telehealth: Payer: Self-pay | Admitting: *Deleted

## 2012-11-23 ENCOUNTER — Ambulatory Visit (HOSPITAL_BASED_OUTPATIENT_CLINIC_OR_DEPARTMENT_OTHER): Payer: BC Managed Care – PPO | Admitting: Oncology

## 2012-11-23 ENCOUNTER — Other Ambulatory Visit: Payer: Self-pay | Admitting: *Deleted

## 2012-11-23 VITALS — BP 126/83 | HR 78 | Temp 97.4°F | Resp 20 | Ht 68.0 in | Wt 155.0 lb

## 2012-11-23 DIAGNOSIS — C189 Malignant neoplasm of colon, unspecified: Secondary | ICD-10-CM

## 2012-11-23 DIAGNOSIS — Z5111 Encounter for antineoplastic chemotherapy: Secondary | ICD-10-CM

## 2012-11-23 DIAGNOSIS — D702 Other drug-induced agranulocytosis: Secondary | ICD-10-CM

## 2012-11-23 DIAGNOSIS — C787 Secondary malignant neoplasm of liver and intrahepatic bile duct: Secondary | ICD-10-CM

## 2012-11-23 DIAGNOSIS — C184 Malignant neoplasm of transverse colon: Secondary | ICD-10-CM

## 2012-11-23 DIAGNOSIS — Z5112 Encounter for antineoplastic immunotherapy: Secondary | ICD-10-CM

## 2012-11-23 DIAGNOSIS — D696 Thrombocytopenia, unspecified: Secondary | ICD-10-CM

## 2012-11-23 MED ORDER — FLUOROURACIL CHEMO INJECTION 500 MG/10ML
230.0000 mg/m2 | Freq: Once | INTRAVENOUS | Status: AC
  Administered 2012-11-23: 400 mg via INTRAVENOUS
  Filled 2012-11-23: qty 8

## 2012-11-23 MED ORDER — ONDANSETRON 8 MG/50ML IVPB (CHCC)
8.0000 mg | Freq: Once | INTRAVENOUS | Status: AC
  Administered 2012-11-23: 8 mg via INTRAVENOUS

## 2012-11-23 MED ORDER — SODIUM CHLORIDE 0.9 % IV SOLN
5.0000 mg/kg | Freq: Once | INTRAVENOUS | Status: AC
  Administered 2012-11-23: 325 mg via INTRAVENOUS
  Filled 2012-11-23: qty 13

## 2012-11-23 MED ORDER — SODIUM CHLORIDE 0.9 % IV SOLN
1360.0000 mg/m2 | INTRAVENOUS | Status: DC
  Administered 2012-11-23: 2500 mg via INTRAVENOUS
  Filled 2012-11-23: qty 50

## 2012-11-23 MED ORDER — SODIUM CHLORIDE 0.9 % IV SOLN
Freq: Once | INTRAVENOUS | Status: AC
  Administered 2012-11-23: 13:00:00 via INTRAVENOUS

## 2012-11-23 MED ORDER — SODIUM CHLORIDE 0.9 % IV SOLN
Freq: Once | INTRAVENOUS | Status: AC
  Administered 2012-11-23: 12:00:00 via INTRAVENOUS

## 2012-11-23 MED ORDER — LEUCOVORIN CALCIUM INJECTION 350 MG
400.0000 mg/m2 | Freq: Once | INTRAVENOUS | Status: AC
  Administered 2012-11-23: 728 mg via INTRAVENOUS
  Filled 2012-11-23: qty 36.4

## 2012-11-23 NOTE — Patient Instructions (Addendum)
[facility] Discharge Instructions for Patients Receiving Chemotherapy  Today you received the following chemotherapy agents; Avastin, Leucovorin and 5FU (fluoracil) bolus and home infusion pump.  To help prevent nausea and vomiting after your treatment, we encourage you to take your nausea medication;  Zofran (ondansetron).   If you develop nausea and vomiting that is not controlled by your nausea medication, call the clinic. If it is after clinic hours your family physician or the after hours number for the clinic or go to the Emergency Department.   BELOW ARE SYMPTOMS THAT SHOULD BE REPORTED IMMEDIATELY:  *FEVER GREATER THAN 100.5 F  *CHILLS WITH OR WITHOUT FEVER  NAUSEA AND VOMITING THAT IS NOT CONTROLLED WITH YOUR NAUSEA MEDICATION  *UNUSUAL SHORTNESS OF BREATH  *UNUSUAL BRUISING OR BLEEDING  TENDERNESS IN MOUTH AND THROAT WITH OR WITHOUT PRESENCE OF ULCERS  *URINARY PROBLEMS  *BOWEL PROBLEMS  UNUSUAL RASH Items with * indicate a potential emergency and should be followed up as soon as possible.   Feel free to call the clinic you have any questions or concerns. The clinic phone number is [phone].   I have been informed and understand all the instructions given to me. I know to contact the clinic, my physician, or go to the Emergency Department if any problems should occur. I do not have any questions at this time, but understand that I may call the clinic during office hours   should I have any questions or need assistance in obtaining follow up care.    __________________________________________  _____________  __________ Signature of Patient or Authorized Representative            Date                   Time    __________________________________________ Nurse's Signature

## 2012-11-23 NOTE — Telephone Encounter (Signed)
Per staff message and POF I have scheduled appts.  JMW  

## 2012-11-23 NOTE — Progress Notes (Signed)
[facility]    OFFICE PROGRESS NOTE   INTERVAL HISTORY:   She returns as scheduled. She completed another cycle of 5-fluorouracil and Avastin on generous seventh 2014. She tolerated chemotherapy well. No new complaint. She continues to have discomfort at the right shoulder.  Objective:  Vital signs in last 24 hours:  Blood pressure 126/83, pulse 78, temperature 97.4 F (36.3 C), temperature source Oral, resp. rate 20, height 5\' 8"  (1.727 m), weight 155 lb (70.308 kg).    HEENT: No thrush or ulcers Resp: Lungs clear bilaterally Cardio: Regular rate and rhythm GI: No hepatomegaly, nontender Vascular: No leg edema  Portacath/PICC-without erythema  Lab Results:  Lab Results  Component Value Date   WBC 5.3 11/22/2012   HGB 14.4 11/22/2012   HCT 41.2 11/22/2012   MCV 88.5 11/22/2012   PLT 101* 11/22/2012   ANC 3.8    Medications: I have reviewed the patient's current medications.  Assessment/Plan: 1.Metastatic colon cancer with multiple liver metastases noted on an MRI of the abdomen 12/02/2010 and on a CT 12/13/2010. A transverse colon mass was noted on the CT from 02/11/2011 and confirmed on a colonoscopy 12/17/2010. She began treatment with FOLFIRI/Avastin 12/31/2010. A restaging CT 06/20/2011 confirmed further improvement in the metastatic liver lesions and no evidence for progressive metastatic disease. A restaging CT 08/01/2011 showed stable liver lesions and no new lesions. Restaging CT 09/12/2011 showed stable liver lesions and no new lesions. A restaging CT 10/27/2011 showed stable liver lesions and no new lesions. Restaging CT 12/05/2011 showed mild decrease in size of hepatic metastases and no evidence of disease progression. Restaging CT 01/26/2012 showed stable hepatic metastases and no new lesions. Restaging CT may 5/8/ 2013 revealed a decreased size of hepatic metastases and no new lesions. Restaging CT 04/23/2012 revealed no change in the measurable  "target" lesions, a slight increase in the size of other lesions, and no new lesions. Restaging CT 06/04/2012 showed multiple hepatic metastases were grossly unchanged. Restaging CT 07/19/2012 with stable disease in the liver. Infusional 5-fluorouracil continued on a 2 week schedule. Treatment was held after the 08/03/2012 cycle due to thrombocytopenia. Restaging CT evaluation 08/30/2012 with mild increase in size of large hepatic metastases. Treatment was resumed with 5 fluorouracil and Avastin on 08/31/2012. Restaging CT 10/14/2012-stable hepatic metastases. Treatment continued with 5 fluorouracil and Avastin.  2. Abdominal pain secondary to hepatomegaly/liver metastases-resolved.  3. Anorexia, early satiety, and weight loss secondary to metastatic colon cancer-resolved.  4. Microcytic anemia. Resolved.  5. History of migraine headaches.  6. Status post uterine fibroid surgery.  7. Port-A-Cath placement 12/20/2010.  8. Delayed nausea following cycle 1 of FOLFIRI/Avastin. Improved with the addition of Aloxi and prophylactic Decadron beginning with cycle #2. The prophylactic Decadron was tapered. We increased the day 1 Decadron back to 20 mg.  9. Neutropenia secondary to chemotherapy. Cycle #4 was held on 02/11/2011. She subsequently received Neulasta support.  10. Intermittent fever with no apparent source for infection, likely "tumor fever" or fever related to chemotherapy. No recent fever.  11. Mucositis secondary to 5-FU, initially grade 2. Irinotecan and 5-FU were dose reduced per study guidelines.  12. History of abdominal cramping. ? Related to irinotecan, 5-FU, or potentially the colon tumor. No recent abdominal cramping.  13. Thrombocytopenia secondary to chemotherapy. Treatment has been held for thrombocytopenia per study guidelines. The thrombocytopenia may be related to Avastin, or chronic liver disease from chemotherapy. The CT on 04/23/2012 confirmed mild splenomegaly. The CT on 06/04/2012  again showed splenomegaly. The thrombocytopenia persisted despite being off of irinotecan and Avastin for several months. The thrombocytopenia is improved today. 14. Acne-like rash on the face. Question related to steroids. Resolved.  15. Arthralgias-most likely unrelated to the colon cancer diagnosis and chemotherapy. She has been evaluated by orthopedics. The etiology of the arthralgias is unclear.  16. Persistent discomfort at the right shoulder. Negative CT of the right upper extremity on 07/19/2012. MRI on 08/06/2012 with a partial thickness bursal surface tear of the supraspinatus tendon, moderate rotator cuff tendinopathy/tendinosis. She is now followed by orthopedics. The pain is stable.   Disposition:  She appears stable. The plan is continued 5-FU/Avastin. She will return for an office visit and chemotherapy in 2 weeks. Her ECoG performance status is measured at 0-1 today.   Thornton Papas, MD  11/23/2012  1:22 PM

## 2012-11-23 NOTE — Progress Notes (Signed)
11/23/12 [name] Cycle 43.  Natalie Mccarthy is in the cancer center today for physical exam and treatment on the [name] clinical trial. She had lab work drawn yesterday. She was seen by Dr. Truett Perna. She reports she is doing well. She continues to have pain in her shoulder and she is continuing physical therapy for that. She does have a small amount of blood when she blows her nose. She also continues to work but does require rest periods because of some fatigue. ECOG PS is 1.  Lab results reviewed by Dr. Truett Perna who finds the abnormal results to be clinically insignificant at this time.   Sign for infusion given to Rosalin Hawking, RN. Research labs drawn at port access before treatment.

## 2012-11-23 NOTE — Telephone Encounter (Signed)
appts made and printed for pt aom,pt aware that tx will be added,email to mw

## 2012-11-25 ENCOUNTER — Ambulatory Visit (HOSPITAL_BASED_OUTPATIENT_CLINIC_OR_DEPARTMENT_OTHER): Payer: BC Managed Care – PPO

## 2012-11-25 VITALS — BP 122/79 | HR 73 | Temp 97.8°F

## 2012-11-25 DIAGNOSIS — C189 Malignant neoplasm of colon, unspecified: Secondary | ICD-10-CM

## 2012-11-25 DIAGNOSIS — C184 Malignant neoplasm of transverse colon: Secondary | ICD-10-CM

## 2012-11-25 MED ORDER — HEPARIN SOD (PORK) LOCK FLUSH 100 UNIT/ML IV SOLN
500.0000 [IU] | Freq: Once | INTRAVENOUS | Status: AC | PRN
  Administered 2012-11-25: 500 [IU]
  Filled 2012-11-25: qty 5

## 2012-11-25 MED ORDER — SODIUM CHLORIDE 0.9 % IJ SOLN
10.0000 mL | INTRAMUSCULAR | Status: DC | PRN
  Administered 2012-11-25: 10 mL
  Filled 2012-11-25: qty 10

## 2012-11-25 NOTE — Patient Instructions (Signed)
Call MD for problems 

## 2012-12-01 ENCOUNTER — Ambulatory Visit (HOSPITAL_COMMUNITY)
Admission: RE | Admit: 2012-12-01 | Discharge: 2012-12-01 | Disposition: A | Discharge status: Home / Self Care | Payer: BC Managed Care – PPO | Source: Ambulatory Visit | Attending: Nurse Practitioner | Admitting: Nurse Practitioner

## 2012-12-01 ENCOUNTER — Encounter (HOSPITAL_COMMUNITY): Payer: Self-pay

## 2012-12-01 DIAGNOSIS — K7689 Other specified diseases of liver: Secondary | ICD-10-CM | POA: Insufficient documentation

## 2012-12-01 DIAGNOSIS — C189 Malignant neoplasm of colon, unspecified: Secondary | ICD-10-CM | POA: Insufficient documentation

## 2012-12-01 MED ORDER — IOHEXOL 300 MG/ML  SOLN
100.0000 mL | Freq: Once | INTRAMUSCULAR | Status: AC | PRN
  Administered 2012-12-01: 100 mL via INTRAVENOUS

## 2012-12-03 ENCOUNTER — Other Ambulatory Visit: Payer: Self-pay | Admitting: *Deleted

## 2012-12-03 DIAGNOSIS — C189 Malignant neoplasm of colon, unspecified: Secondary | ICD-10-CM

## 2012-12-06 ENCOUNTER — Other Ambulatory Visit (HOSPITAL_BASED_OUTPATIENT_CLINIC_OR_DEPARTMENT_OTHER): Payer: BC Managed Care – PPO | Admitting: Lab

## 2012-12-06 ENCOUNTER — Encounter: Payer: Self-pay | Admitting: *Deleted

## 2012-12-06 DIAGNOSIS — C184 Malignant neoplasm of transverse colon: Secondary | ICD-10-CM

## 2012-12-06 DIAGNOSIS — C787 Secondary malignant neoplasm of liver and intrahepatic bile duct: Secondary | ICD-10-CM

## 2012-12-06 DIAGNOSIS — C189 Malignant neoplasm of colon, unspecified: Secondary | ICD-10-CM

## 2012-12-06 LAB — CBC WITH DIFFERENTIAL/PLATELET
Basophils Absolute: 0 10*3/uL (ref 0.0–0.1)
EOS%: 4.3 % (ref 0.0–7.0)
HCT: 42 % (ref 34.8–46.6)
HGB: 14.4 g/dL (ref 11.6–15.9)
LYMPH%: 12.2 % — ABNORMAL LOW (ref 14.0–49.7)
MCH: 30.8 pg (ref 25.1–34.0)
MONO#: 0.4 10*3/uL (ref 0.1–0.9)
NEUT%: 70.5 % (ref 38.4–76.8)
Platelets: 66 10*3/uL — ABNORMAL LOW (ref 145–400)
lymph#: 0.4 10*3/uL — ABNORMAL LOW (ref 0.9–3.3)

## 2012-12-06 LAB — URINALYSIS, MICROSCOPIC - CHCC
Ketones: NEGATIVE mg/dL
Protein: NEGATIVE mg/dL
Specific Gravity, Urine: 1.03 (ref 1.003–1.035)
pH: 6 (ref 4.6–8.0)

## 2012-12-06 LAB — COMPREHENSIVE METABOLIC PANEL (CC13)
ALT: 44 U/L (ref 0–55)
Albumin: 3.8 g/dL (ref 3.5–5.0)
CO2: 24 mEq/L (ref 22–29)
Calcium: 9.8 mg/dL (ref 8.4–10.4)
Chloride: 105 mEq/L (ref 98–107)
Sodium: 139 mEq/L (ref 136–145)
Total Protein: 7.2 g/dL (ref 6.4–8.3)

## 2012-12-06 NOTE — Progress Notes (Signed)
12/06/12 [name] clinical Trial Reviewed the result of the CT of the abd done 12/01/12. There has been some progression. Discussed with Dr. Truett Perna. He will review the scans before Natalie Mccarthy comes in the morning.   12/07/12 Natalie Mccarthy is in the cancer center today ffor lab wotk, ophysical exam, and review with of the CT with Dr. Truett Perna.  Dr. Truett Perna has reviewed the scans from Sept, Dec. And last week with the radiologist. He se a new lesion not previously reported. After discussion Natalie Mccarthy, he will discontinue study treatment. She will return next month for end of study visit and planning for further treatment.

## 2012-12-07 ENCOUNTER — Ambulatory Visit (HOSPITAL_BASED_OUTPATIENT_CLINIC_OR_DEPARTMENT_OTHER): Payer: BC Managed Care – PPO | Admitting: Oncology

## 2012-12-07 ENCOUNTER — Other Ambulatory Visit: Payer: BC Managed Care – PPO | Admitting: Lab

## 2012-12-07 ENCOUNTER — Ambulatory Visit: Payer: BC Managed Care – PPO

## 2012-12-07 VITALS — BP 124/77 | HR 83 | Temp 96.5°F | Resp 18 | Ht 68.0 in | Wt 156.9 lb

## 2012-12-07 DIAGNOSIS — C184 Malignant neoplasm of transverse colon: Secondary | ICD-10-CM

## 2012-12-07 DIAGNOSIS — D696 Thrombocytopenia, unspecified: Secondary | ICD-10-CM

## 2012-12-07 DIAGNOSIS — C189 Malignant neoplasm of colon, unspecified: Secondary | ICD-10-CM

## 2012-12-07 DIAGNOSIS — D702 Other drug-induced agranulocytosis: Secondary | ICD-10-CM

## 2012-12-07 DIAGNOSIS — C787 Secondary malignant neoplasm of liver and intrahepatic bile duct: Secondary | ICD-10-CM

## 2012-12-07 NOTE — Progress Notes (Signed)
[facility]    OFFICE PROGRESS NOTE   INTERVAL HISTORY:   She returns as scheduled. She was last treated with 5 fluorouracil and Avastin on 11/23/2012. No new complaint. She continues to have discomfort at the right shoulder. No abdominal pain, nausea, or diarrhea.  Objective:  Vital signs in last 24 hours:  Blood pressure 124/77, pulse 83, temperature 96.5 F (35.8 C), temperature source Oral, resp. rate 18, height 5\' 8"  (1.727 m), weight 156 lb 14.4 oz (71.169 kg).    HEENT: No thrush or ulcers Lymphatics: No cervical, supra-clavicular, axillary, or inguinal nodes Resp: Lungs clear bilaterally Cardio: Regular rate and rhythm GI: No hepatomegaly, no mass, nontender Vascular: No leg edema   Portacath/PICC-without erythema  Lab Results:  Lab Results  Component Value Date   WBC 3.6* 12/06/2012   HGB 14.4 12/06/2012   HCT 42.0 12/06/2012   MCV 90.0 12/06/2012   PLT 66* 12/06/2012   ANC 2.5  X-rays: Restaging CT of the abdomen on 12/01/2012 was compared to a CT from 10/14/2012-a target lesion in the central right liver measures 49 mm compared to a previous measurement 42 mm. A lesion in the medial left liver measures 20 mm compared to 20 mm. Several of the lesions have increased slightly in size. No new lesions. The lung bases are clear.    Medications: I have reviewed the patient's current medications.  Assessment/Plan: 1.Metastatic colon cancer with multiple liver metastases noted on an MRI of the abdomen 12/02/2010 and on a CT 12/13/2010. A transverse colon mass was noted on the CT from 02/11/2011 and confirmed on a colonoscopy 12/17/2010. She began treatment with FOLFIRI/Avastin 12/31/2010. A restaging CT 06/20/2011 confirmed further improvement in the metastatic liver lesions and no evidence for progressive metastatic disease. A restaging CT 08/01/2011 showed stable liver lesions and no new lesions. Restaging CT 09/12/2011 showed stable liver lesions and no new  lesions. A restaging CT 10/27/2011 showed stable liver lesions and no new lesions. Restaging CT 12/05/2011 showed mild decrease in size of hepatic metastases and no evidence of disease progression. Restaging CT 01/26/2012 showed stable hepatic metastases and no new lesions. Restaging CT may 5/8/ 2013 revealed a decreased size of hepatic metastases and no new lesions. Restaging CT 04/23/2012 revealed no change in the measurable "target" lesions, a slight increase in the size of other lesions, and no new lesions. Restaging CT 06/04/2012 showed multiple hepatic metastases were grossly unchanged. Restaging CT 07/19/2012 with stable disease in the liver. Infusional 5-fluorouracil continued on a 2 week schedule. Treatment was held after the 08/03/2012 cycle due to thrombocytopenia. Restaging CT evaluation 08/30/2012 with mild increase in size of large hepatic metastases. Treatment was resumed with 5 fluorouracil and Avastin on 08/31/2012. Restaging CT 10/14/2012-stable hepatic metastases. Treatment continued with 5 fluorouracil and Avastin. Restaging CT 12/01/2012 revealed mild progression in the size of multiple hepatic lesions. 2. Abdominal pain secondary to hepatomegaly/liver metastases-resolved.  3. Anorexia, early satiety, and weight loss secondary to metastatic colon cancer-resolved.  4. Microcytic anemia. Resolved.  5. History of migraine headaches.  6. Status post uterine fibroid surgery.  7. Port-A-Cath placement 12/20/2010.  8. Delayed nausea following cycle 1 of FOLFIRI/Avastin. Improved with the addition of Aloxi and prophylactic Decadron beginning with cycle #2. The prophylactic Decadron was tapered. We increased the day 1 Decadron back to 20 mg.  9. Neutropenia secondary to chemotherapy. Cycle #4 was held on 02/11/2011. She subsequently received Neulasta support.  10. Intermittent fever with no apparent source for  infection, likely "tumor fever" or fever related to chemotherapy. No recent fever.    11. Mucositis secondary to 5-FU, initially grade 2. Irinotecan and 5-FU were dose reduced per study guidelines.  12. History of abdominal cramping. ? Related to irinotecan, 5-FU, or potentially the colon tumor. No recent abdominal cramping.  13. Thrombocytopenia secondary to chemotherapy. Treatment has been held for thrombocytopenia per study guidelines. The thrombocytopenia may be related to Avastin, or chronic liver disease from chemotherapy. The CT on 04/23/2012 confirmed mild splenomegaly. The CT on 06/04/2012 again showed splenomegaly. The thrombocytopenia persisted despite being off of irinotecan and Avastin for several months. She again has thrombocytopenia. 14. Acne-like rash on the face. Question related to steroids. Resolved.  15. Arthralgias-most likely unrelated to the colon cancer diagnosis and chemotherapy. She has been evaluated by orthopedics. The etiology of the arthralgias is unclear.  16. Persistent discomfort at the right shoulder. Negative CT of the right upper extremity on 07/19/2012. MRI on 08/06/2012 with a partial thickness bursal surface tear of the supraspinatus tendon, moderate rotator cuff tendinopathy/tendinosis. She is now followed by orthopedics. The pain is stable.    Disposition:  She appears stable. She has an ECoG performance status of 0-1. She appears asymptomatic from the colon cancer.  Natalie Mccarthy has been maintained on systemic therapy for the past 2 years. I reviewed the 12/01/2012 CT with a radiologist. This included review of CT scans dating back to September of 2013. Multiple liver lesions have grown slightly in size over the past several months and there is at least one new lesion. It appears the metastatic tumor burden is slowly progressing in the liver. I discussed the restaging CT findings with Natalie Mccarthy and her husband. I recommend discontinuing 5-FU/Avastin. We discussed a treatment break versus switching to a different systemic therapy. I favor a  treatment break since she has been on continual chemotherapy for the past 2 years and there is persistent thrombocytopenia. She agrees to this plan. Natalie Mccarthy will return for an office and lab visit in one month. We will consider switching to an oxaliplatin-based regimen when the platelet count recovers. I will present her case at the GI tumor conference on 12/08/2012 to review the possibility of hepatic directed therapy.   Thornton Papas, MD  12/07/2012  5:51 PM

## 2012-12-13 ENCOUNTER — Other Ambulatory Visit: Payer: Self-pay | Admitting: *Deleted

## 2012-12-13 DIAGNOSIS — C189 Malignant neoplasm of colon, unspecified: Secondary | ICD-10-CM

## 2012-12-14 ENCOUNTER — Telehealth: Payer: Self-pay | Admitting: Oncology

## 2012-12-14 NOTE — Telephone Encounter (Signed)
Called pt and left message regarding appt on 3/6 lab,MD, ekg and flush

## 2012-12-20 ENCOUNTER — Other Ambulatory Visit: Payer: BC Managed Care – PPO

## 2012-12-21 ENCOUNTER — Ambulatory Visit: Payer: BC Managed Care – PPO

## 2012-12-21 ENCOUNTER — Ambulatory Visit: Payer: BC Managed Care – PPO | Admitting: Nurse Practitioner

## 2013-01-06 ENCOUNTER — Other Ambulatory Visit: Payer: Self-pay

## 2013-01-06 ENCOUNTER — Telehealth: Payer: Self-pay | Admitting: Oncology

## 2013-01-06 ENCOUNTER — Ambulatory Visit: Payer: BC Managed Care – PPO

## 2013-01-06 ENCOUNTER — Ambulatory Visit (HOSPITAL_BASED_OUTPATIENT_CLINIC_OR_DEPARTMENT_OTHER): Payer: BC Managed Care – PPO | Admitting: Oncology

## 2013-01-06 ENCOUNTER — Encounter: Payer: Self-pay | Admitting: *Deleted

## 2013-01-06 ENCOUNTER — Other Ambulatory Visit (HOSPITAL_BASED_OUTPATIENT_CLINIC_OR_DEPARTMENT_OTHER): Payer: BC Managed Care – PPO | Admitting: Lab

## 2013-01-06 VITALS — BP 131/74 | HR 78 | Temp 97.0°F | Resp 20 | Ht 68.0 in | Wt 158.9 lb

## 2013-01-06 DIAGNOSIS — R109 Unspecified abdominal pain: Secondary | ICD-10-CM

## 2013-01-06 DIAGNOSIS — C189 Malignant neoplasm of colon, unspecified: Secondary | ICD-10-CM

## 2013-01-06 DIAGNOSIS — C787 Secondary malignant neoplasm of liver and intrahepatic bile duct: Secondary | ICD-10-CM

## 2013-01-06 DIAGNOSIS — C187 Malignant neoplasm of sigmoid colon: Secondary | ICD-10-CM

## 2013-01-06 DIAGNOSIS — D702 Other drug-induced agranulocytosis: Secondary | ICD-10-CM

## 2013-01-06 LAB — CBC WITH DIFFERENTIAL/PLATELET
BASO%: 0.8 % (ref 0.0–2.0)
LYMPH%: 13.4 % — ABNORMAL LOW (ref 14.0–49.7)
MCHC: 34.3 g/dL (ref 31.5–36.0)
MCV: 86.8 fL (ref 79.5–101.0)
MONO#: 0.3 10*3/uL (ref 0.1–0.9)
MONO%: 7.9 % (ref 0.0–14.0)
Platelets: 80 10*3/uL — ABNORMAL LOW (ref 145–400)
RBC: 4.81 10*6/uL (ref 3.70–5.45)
WBC: 3.7 10*3/uL — ABNORMAL LOW (ref 3.9–10.3)

## 2013-01-06 LAB — URINALYSIS, MICROSCOPIC - CHCC
Leukocyte Esterase: NEGATIVE
Nitrite: NEGATIVE
Protein: NEGATIVE mg/dL
Urobilinogen, UR: 0.2 mg/dL (ref 0.2–1)

## 2013-01-06 LAB — COMPREHENSIVE METABOLIC PANEL (CC13)
ALT: 31 U/L (ref 0–55)
BUN: 16.9 mg/dL (ref 7.0–26.0)
CO2: 27 mEq/L (ref 22–29)
Calcium: 9.7 mg/dL (ref 8.4–10.4)
Chloride: 104 mEq/L (ref 98–107)
Creatinine: 0.8 mg/dL (ref 0.6–1.1)

## 2013-01-06 LAB — CEA: CEA: 32.1 ng/mL — ABNORMAL HIGH (ref 0.0–5.0)

## 2013-01-06 MED ORDER — HEPARIN SOD (PORK) LOCK FLUSH 100 UNIT/ML IV SOLN
500.0000 [IU] | Freq: Once | INTRAVENOUS | Status: AC
  Administered 2013-01-06: 500 [IU] via INTRAVENOUS
  Filled 2013-01-06: qty 5

## 2013-01-06 MED ORDER — SODIUM CHLORIDE 0.9 % IJ SOLN
10.0000 mL | INTRAMUSCULAR | Status: DC | PRN
  Administered 2013-01-06: 10 mL via INTRAVENOUS
  Filled 2013-01-06: qty 10

## 2013-01-06 NOTE — Telephone Encounter (Signed)
Gave pt appt for MD visit and flush for APril 2014

## 2013-01-06 NOTE — Progress Notes (Signed)
[facility]    OFFICE PROGRESS NOTE   INTERVAL HISTORY:   She returns as scheduled. She has intermittent discomfort at the low anterior chest and abdomen. No consistent pain. No difficulty with bowel or bladder function. The right shoulder pain is partially improved. No bleeding.  Objective:  Vital signs in last 24 hours:  Blood pressure 131/74, pulse 78, temperature 97 F (36.1 C), temperature source Oral, resp. rate 20, height 5\' 8"  (1.727 m), weight 158 lb 14.4 oz (72.077 kg).    HEENT: Neck without mass Resp: Lungs clear bilaterally Cardio: Regular rate and rhythm GI: No hepatosplenomegaly, no mass, nontender Vascular: No leg edema   Lab Results:  Lab Results  Component Value Date   WBC 3.7* 01/06/2013   HGB 14.3 01/06/2013   HCT 41.7 01/06/2013   MCV 86.8 01/06/2013   PLT 80* 01/06/2013   ANC 2.7    Medications: I have reviewed the patient's current medications.  Assessment/Plan: 1.Metastatic colon cancer with multiple liver metastases noted on an MRI of the abdomen 12/02/2010 and on a CT 12/13/2010. A transverse colon mass was noted on the CT from 02/11/2011 and confirmed on a colonoscopy 12/17/2010. She began treatment with FOLFIRI/Avastin 12/31/2010. A restaging CT 06/20/2011 confirmed further improvement in the metastatic liver lesions and no evidence for progressive metastatic disease. A restaging CT 08/01/2011 showed stable liver lesions and no new lesions. Restaging CT 09/12/2011 showed stable liver lesions and no new lesions. A restaging CT 10/27/2011 showed stable liver lesions and no new lesions. Restaging CT 12/05/2011 showed mild decrease in size of hepatic metastases and no evidence of disease progression. Restaging CT 01/26/2012 showed stable hepatic metastases and no new lesions. Restaging CT may 5/8/ 2013 revealed a decreased size of hepatic metastases and no new lesions. Restaging CT 04/23/2012 revealed no change in the measurable "target"  lesions, a slight increase in the size of other lesions, and no new lesions. Restaging CT 06/04/2012 showed multiple hepatic metastases were grossly unchanged. Restaging CT 07/19/2012 with stable disease in the liver. Infusional 5-fluorouracil continued on a 2 week schedule. Treatment was held after the 08/03/2012 cycle due to thrombocytopenia. Restaging CT evaluation 08/30/2012 with mild increase in size of large hepatic metastases. Treatment was resumed with 5 fluorouracil and Avastin on 08/31/2012. Restaging CT 10/14/2012-stable hepatic metastases. Treatment continued with 5 fluorouracil and Avastin. Restaging CT 12/01/2012 revealed mild progression in the size of multiple hepatic lesions.  2. Abdominal pain secondary to hepatomegaly/liver metastases-resolved.  3. Anorexia, early satiety, and weight loss secondary to metastatic colon cancer-resolved.  4. Microcytic anemia. Resolved.  5. History of migraine headaches.  6. Status post uterine fibroid surgery.  7. Port-A-Cath placement 12/20/2010.  8. Delayed nausea following cycle 1 of FOLFIRI/Avastin. Improved with the addition of Aloxi and prophylactic Decadron beginning with cycle #2. The prophylactic Decadron was tapered. We increased the day 1 Decadron back to 20 mg.  9. Neutropenia secondary to chemotherapy. Cycle #4 was held on 02/11/2011. She subsequently received Neulasta support.  10. Intermittent fever with no apparent source for infection, likely "tumor fever" or fever related to chemotherapy. No recent fever.  11. Mucositis secondary to 5-FU, initially grade 2. Irinotecan and 5-FU were dose reduced per study guidelines.  12. History of abdominal cramping. ? Related to irinotecan, 5-FU, or potentially the colon tumor. No recent abdominal cramping.  13. Thrombocytopenia secondary to chemotherapy. Treatment has been held for thrombocytopenia per study guidelines. The thrombocytopenia may be related to Avastin, or chronic  liver disease from  chemotherapy. The CT on 04/23/2012 confirmed mild splenomegaly. The CT on 06/04/2012 again showed splenomegaly. The thrombocytopenia persisted despite being off of irinotecan and Avastin for several months. She has persistent mild thrombocytopenia. 14. Acne-like rash on the face. Question related to steroids. Resolved.  15. Arthralgias-most likely unrelated to the colon cancer diagnosis and chemotherapy. She has been evaluated by orthopedics. The etiology of the arthralgias is unclear.  16. Persistent discomfort at the right shoulder. Negative CT of the right upper extremity on 07/19/2012. MRI on 08/06/2012 with a partial thickness bursal surface tear of the supraspinatus tendon, moderate rotator cuff tendinopathy/tendinosis. She is now followed by orthopedics. The pain is stable.    Disposition: She appears stable. I doubt the intermittent mild abdominal discomfort is related to tumor progression. She will contact us for consistent pain. I presented her case at the GI tumor conference last month and she is not a candidate for a liver resection procedure. We decided to continue an observation approach. Natalie Mccarthy will return for an office visit and Port-A-Cath flush in one month. We will plan for a restaging CT at a 3 -4 month interval.   Natalie Papas, MD  01/06/2013  5:15 PM

## 2013-01-06 NOTE — Progress Notes (Signed)
01/06/2013 Natalie Mccarthy is in the cancer center today for the end of treatment visit on the [name] clinical trial. She is seen by Dr. Truett Perna. She reports that she does not have nose bleeds, her taste has returned. She continues to have some fatigue.  Research labs were drawn. Lab work results were reviewed by Dr. Truett Perna. Per Dr. Truett Perna, the abnormal results are not clinically significant.  She also reports her LMP was over 2 years ago  before she received chemotherapy.   Explained that she was still in the study in follow up and I would see her and collect data at specified time points.   02/03/13 entered to sign

## 2013-02-04 ENCOUNTER — Ambulatory Visit: Payer: BC Managed Care – PPO

## 2013-02-04 ENCOUNTER — Telehealth: Payer: Self-pay | Admitting: Oncology

## 2013-02-04 ENCOUNTER — Ambulatory Visit (HOSPITAL_BASED_OUTPATIENT_CLINIC_OR_DEPARTMENT_OTHER): Payer: BC Managed Care – PPO | Admitting: Oncology

## 2013-02-04 VITALS — BP 125/68 | HR 86 | Temp 97.2°F | Resp 18 | Ht 68.0 in | Wt 158.5 lb

## 2013-02-04 DIAGNOSIS — C189 Malignant neoplasm of colon, unspecified: Secondary | ICD-10-CM

## 2013-02-04 DIAGNOSIS — C787 Secondary malignant neoplasm of liver and intrahepatic bile duct: Secondary | ICD-10-CM

## 2013-02-04 DIAGNOSIS — D696 Thrombocytopenia, unspecified: Secondary | ICD-10-CM

## 2013-02-04 DIAGNOSIS — C187 Malignant neoplasm of sigmoid colon: Secondary | ICD-10-CM

## 2013-02-04 DIAGNOSIS — M2550 Pain in unspecified joint: Secondary | ICD-10-CM

## 2013-02-04 MED ORDER — SODIUM CHLORIDE 0.9 % IJ SOLN
10.0000 mL | INTRAMUSCULAR | Status: DC | PRN
  Administered 2013-02-04: 10 mL via INTRAVENOUS
  Filled 2013-02-04: qty 10

## 2013-02-04 MED ORDER — HEPARIN SOD (PORK) LOCK FLUSH 100 UNIT/ML IV SOLN
500.0000 [IU] | Freq: Once | INTRAVENOUS | Status: AC
  Administered 2013-02-04: 500 [IU] via INTRAVENOUS
  Filled 2013-02-04: qty 5

## 2013-02-04 NOTE — Progress Notes (Signed)
[facility]    OFFICE PROGRESS NOTE   INTERVAL HISTORY:   She returns as scheduled. No new complaint. She continues to have limited range of motion at the right shoulder. No abdominal pain or bleeding. She continues to work.  Objective:  Vital signs in last 24 hours:  Blood pressure 125/68, pulse 86, temperature 97.2 F (36.2 C), temperature source Oral, resp. rate 18, height 5\' 8"  (1.727 m), weight 158 lb 8 oz (71.895 kg).    HEENT: Neck without mass Resp: Lungs clear bilaterally Cardio: Regular rate and rhythm GI: Nontender, no hepatomegaly Vascular: No leg edema    Portacath/PICC-without erythema  Lab Results:  Lab Results  Component Value Date   WBC 3.7* 01/06/2013   HGB 14.3 01/06/2013   HCT 41.7 01/06/2013   MCV 86.8 01/06/2013   PLT 80* 01/06/2013   ANC 2.7  CEA 32.1    Medications: I have reviewed the patient's current medications.  Assessment/Plan: 1.Metastatic colon cancer with multiple liver metastases noted on an MRI of the abdomen 12/02/2010 and on a CT 12/13/2010. A transverse colon mass was noted on the CT from 02/11/2011 and confirmed on a colonoscopy 12/17/2010. She began treatment with FOLFIRI/Avastin 12/31/2010. A restaging CT 06/20/2011 confirmed further improvement in the metastatic liver lesions and no evidence for progressive metastatic disease. A restaging CT 08/01/2011 showed stable liver lesions and no new lesions. Restaging CT 09/12/2011 showed stable liver lesions and no new lesions. A restaging CT 10/27/2011 showed stable liver lesions and no new lesions. Restaging CT 12/05/2011 showed mild decrease in size of hepatic metastases and no evidence of disease progression. Restaging CT 01/26/2012 showed stable hepatic metastases and no new lesions. Restaging CT may 5/8/ 2013 revealed a decreased size of hepatic metastases and no new lesions. Restaging CT 04/23/2012 revealed no change in the measurable "target" lesions, a slight increase in  the size of other lesions, and no new lesions. Restaging CT 06/04/2012 showed multiple hepatic metastases were grossly unchanged. Restaging CT 07/19/2012 with stable disease in the liver. Infusional 5-fluorouracil continued on a 2 week schedule. Treatment was held after the 08/03/2012 cycle due to thrombocytopenia. Restaging CT evaluation 08/30/2012 with mild increase in size of large hepatic metastases. Treatment was resumed with 5 fluorouracil and Avastin on 08/31/2012. Restaging CT 10/14/2012-stable hepatic metastases. Treatment continued with 5 fluorouracil and Avastin. Restaging CT 12/01/2012 revealed mild progression in the size of multiple hepatic lesions. Treatment was placed on hold. 2. Abdominal pain secondary to hepatomegaly/liver metastases-resolved.  3. Anorexia, early satiety, and weight loss secondary to metastatic colon cancer-resolved.  4. Microcytic anemia. Resolved.  5. History of migraine headaches.  6. Status post uterine fibroid surgery.  7. Port-A-Cath placement 12/20/2010.  8. Delayed nausea following cycle 1 of FOLFIRI/Avastin. Improved with the addition of Aloxi and prophylactic Decadron beginning with cycle #2. The prophylactic Decadron was tapered. We increased the day 1 Decadron back to 20 mg.  9. Neutropenia secondary to chemotherapy. Cycle #4 was held on 02/11/2011. She subsequently received Neulasta support.  10. Intermittent fever with no apparent source for infection, likely "tumor fever" or fever related to chemotherapy. No recent fever.  11. Mucositis secondary to 5-FU, initially grade 2. Irinotecan and 5-FU were dose reduced per study guidelines.  12. History of abdominal cramping. ? Related to irinotecan, 5-FU, or potentially the colon tumor. No recent abdominal cramping.  13. Thrombocytopenia secondary to chemotherapy. Treatment has been held for thrombocytopenia per study guidelines. The thrombocytopenia may be related to  Avastin, or chronic liver disease from  chemotherapy. The CT on 04/23/2012 confirmed mild splenomegaly. The CT on 06/04/2012 again showed splenomegaly. The thrombocytopenia persisted despite being off of irinotecan and Avastin for several months. She has persistent mild thrombocytopenia.  14. Acne-like rash on the face. Question related to steroids. Resolved.  15. Arthralgias-most likely unrelated to the colon cancer diagnosis and chemotherapy. She has been evaluated by orthopedics. The etiology of the arthralgias is unclear.  16. Persistent discomfort at the right shoulder. Negative CT of the right upper extremity on 07/19/2012. MRI on 08/06/2012 with a partial thickness bursal surface tear of the supraspinatus tendon, moderate rotator cuff tendinopathy/tendinosis. She is now followed by orthopedics. The pain is stable.    Disposition:  She appears stable. The Port-A-Cath was flushed today. She will return for an office visit, Port-A-Cath flush, and CEA on 03/11/2013. We will check a CBC on 03/11/2013. The plan is to obtain a restaging CT evaluation at an approximate four-month interval. We will request K-ras testing on the 2012 liver biopsy.   Thornton Papas, MD  02/04/2013  9:11 AM

## 2013-02-04 NOTE — Telephone Encounter (Signed)
gv and printed appt schedule for pt for May °

## 2013-02-07 ENCOUNTER — Telehealth: Payer: Self-pay | Admitting: *Deleted

## 2013-02-07 NOTE — Telephone Encounter (Signed)
Spoke with Natalie Mccarthy in pathology and requested KRAS testing on 2012  liver biopsy Accession #: [id number] per request of Dr. Truett Perna.

## 2013-02-10 ENCOUNTER — Telehealth: Payer: Self-pay | Admitting: *Deleted

## 2013-02-10 NOTE — Telephone Encounter (Signed)
Left VM for her to come to office tomorrow am and nurse will look at her arm.

## 2013-02-10 NOTE — Telephone Encounter (Signed)
Left VM that she has noted a lump on her right arm and area around the lump seems larger than the other arm. Is asking if she needs to see the ortho physician or what?

## 2013-02-11 ENCOUNTER — Encounter: Payer: Self-pay | Admitting: *Deleted

## 2013-02-11 ENCOUNTER — Ambulatory Visit (HOSPITAL_COMMUNITY)
Admission: RE | Admit: 2013-02-11 | Discharge: 2013-02-11 | Disposition: A | Discharge status: Home / Self Care | Payer: BC Managed Care – PPO | Source: Ambulatory Visit | Attending: Oncology | Admitting: Oncology

## 2013-02-11 ENCOUNTER — Ambulatory Visit (HOSPITAL_BASED_OUTPATIENT_CLINIC_OR_DEPARTMENT_OTHER): Payer: BC Managed Care – PPO | Admitting: Nurse Practitioner

## 2013-02-11 ENCOUNTER — Other Ambulatory Visit (HOSPITAL_COMMUNITY): Payer: Self-pay | Admitting: Oncology

## 2013-02-11 VITALS — BP 100/68 | HR 72 | Temp 97.1°F | Resp 20 | Ht 68.0 in | Wt 155.5 lb

## 2013-02-11 DIAGNOSIS — M79609 Pain in unspecified limb: Secondary | ICD-10-CM | POA: Insufficient documentation

## 2013-02-11 DIAGNOSIS — M25521 Pain in right elbow: Secondary | ICD-10-CM

## 2013-02-11 DIAGNOSIS — R609 Edema, unspecified: Secondary | ICD-10-CM

## 2013-02-11 DIAGNOSIS — M542 Cervicalgia: Secondary | ICD-10-CM

## 2013-02-11 DIAGNOSIS — C187 Malignant neoplasm of sigmoid colon: Secondary | ICD-10-CM

## 2013-02-11 DIAGNOSIS — C189 Malignant neoplasm of colon, unspecified: Secondary | ICD-10-CM

## 2013-02-11 DIAGNOSIS — C787 Secondary malignant neoplasm of liver and intrahepatic bile duct: Secondary | ICD-10-CM

## 2013-02-11 NOTE — Progress Notes (Signed)
VASCULAR LAB, [facility], CALLED. VENOUS STUDY OF RIGHT ARM WAS NEGATIVE. NOTIFIED DR.SHERRILL AND LISA THOMAS,NP OF RESULTS. VERBAL ORDER AND READ BACK TO DR.SHERRILL- PT. MAY GO HOME. NOTIFIED [facility].

## 2013-02-11 NOTE — Progress Notes (Signed)
OFFICE PROGRESS NOTE  Interval history:  Natalie Mccarthy presents prior to scheduled followup. She has recently noted fullness of the right arm and right chest as well as intermittent pain at the right neck over the past 2 days. She denies fever. No chest pain. No shortness of breath.   Objective: Blood pressure 100/68, pulse 72, temperature 97.1 F (36.2 C), temperature source Oral, resp. rate 20, height 5\' 8"  (1.727 m), weight 155 lb 8 oz (70.534 kg).  Lungs are clear. Regular cardiac rhythm. Abdomen soft and nontender. Legs without edema. Slight full appearance of the right upper arm as compared to the left upper arm. Mild vein distention across the chest wall, more prominent on the right. Fullness over the right chest wall superior to the Port-A-Cath site. The Port-A-Cath site is nontender and without erythema.  Lab Results: Lab Results  Component Value Date   WBC 3.7* 01/06/2013   HGB 14.3 01/06/2013   HCT 41.7 01/06/2013   MCV 86.8 01/06/2013   PLT 80* 01/06/2013    Chemistry:    Chemistry      Component Value Date/Time   NA 141 01/06/2013 0942   NA 139 06/22/2012 1108   NA 143 01/27/2012 1354   K 4.1 01/06/2013 0942   K 3.8 06/22/2012 1108   K 3.8 01/27/2012 1354   CL 104 01/06/2013 0942   CL 103 06/22/2012 1108   CL 97* 01/27/2012 1354   CO2 27 01/06/2013 0942   CO2 31 06/22/2012 1108   CO2 29 01/27/2012 1354   BUN 16.9 01/06/2013 0942   BUN 19 06/22/2012 1108   BUN 11 01/27/2012 1354   CREATININE 0.8 01/06/2013 0942   CREATININE 0.75 06/22/2012 1108   CREATININE 0.9 01/27/2012 1354      Component Value Date/Time   CALCIUM 9.7 01/06/2013 0942   CALCIUM 9.7 06/22/2012 1108   CALCIUM 8.9 01/27/2012 1354   ALKPHOS 186* 01/06/2013 0942   ALKPHOS 185* 06/22/2012 1108   ALKPHOS 198* 01/27/2012 1354   AST 37* 01/06/2013 0942   AST 36 06/22/2012 1108   AST 34 01/27/2012 1354   ALT 31 01/06/2013 0942   ALT 36* 06/22/2012 1108   BILITOT 0.64 01/06/2013 0942   BILITOT 0.5 06/22/2012 1108   BILITOT 0.50 01/27/2012  1354       Studies/Results: No results found.  Medications: I have reviewed the patient's current medications.  Assessment/Plan:  1. Fullness over the right chest wall and right upper arm. Question Port-A-Cath associated DVT. 2. Metastatic colon cancer currently on a treatment break.  We are referring her for a venous Doppler of the right arm and neck. If a deep vein thrombosis is confirmed she will return to the office to initiate anticoagulation. If the study is negative she will return as scheduled on 03/11/2013.  Plan reviewed with Dr. Truett Perna.  Lonna Cobb ANP/GNP-BC

## 2013-02-11 NOTE — Progress Notes (Signed)
Right upper extremity venous duplex:  No evidence of DVT or superficial thrombosis.    

## 2013-03-11 ENCOUNTER — Encounter: Payer: Self-pay | Admitting: *Deleted

## 2013-03-11 ENCOUNTER — Ambulatory Visit (HOSPITAL_BASED_OUTPATIENT_CLINIC_OR_DEPARTMENT_OTHER): Payer: BC Managed Care – PPO | Admitting: Oncology

## 2013-03-11 ENCOUNTER — Ambulatory Visit: Payer: BC Managed Care – PPO

## 2013-03-11 ENCOUNTER — Telehealth: Payer: Self-pay | Admitting: Oncology

## 2013-03-11 ENCOUNTER — Other Ambulatory Visit (HOSPITAL_BASED_OUTPATIENT_CLINIC_OR_DEPARTMENT_OTHER): Payer: BC Managed Care – PPO | Admitting: Lab

## 2013-03-11 VITALS — BP 128/69 | HR 72 | Temp 97.8°F | Resp 18 | Ht 67.0 in | Wt 156.8 lb

## 2013-03-11 DIAGNOSIS — C189 Malignant neoplasm of colon, unspecified: Secondary | ICD-10-CM

## 2013-03-11 DIAGNOSIS — C187 Malignant neoplasm of sigmoid colon: Secondary | ICD-10-CM

## 2013-03-11 DIAGNOSIS — C787 Secondary malignant neoplasm of liver and intrahepatic bile duct: Secondary | ICD-10-CM

## 2013-03-11 DIAGNOSIS — R109 Unspecified abdominal pain: Secondary | ICD-10-CM

## 2013-03-11 LAB — CBC WITH DIFFERENTIAL/PLATELET
BASO%: 1 % (ref 0.0–2.0)
Eosinophils Absolute: 0.2 10*3/uL (ref 0.0–0.5)
HCT: 40.1 % (ref 34.8–46.6)
LYMPH%: 11.7 % — ABNORMAL LOW (ref 14.0–49.7)
MCHC: 34.1 g/dL (ref 31.5–36.0)
MONO#: 0.5 10*3/uL (ref 0.1–0.9)
NEUT#: 4.3 10*3/uL (ref 1.5–6.5)
NEUT%: 74.2 % (ref 38.4–76.8)
Platelets: 123 10*3/uL — ABNORMAL LOW (ref 145–400)
RBC: 4.9 10*6/uL (ref 3.70–5.45)
WBC: 5.8 10*3/uL (ref 3.9–10.3)
lymph#: 0.7 10*3/uL — ABNORMAL LOW (ref 0.9–3.3)

## 2013-03-11 LAB — COMPREHENSIVE METABOLIC PANEL (CC13)
ALT: 35 U/L (ref 0–55)
AST: 43 U/L — ABNORMAL HIGH (ref 5–34)
Albumin: 3.5 g/dL (ref 3.5–5.0)
CO2: 26 mEq/L (ref 22–29)
Calcium: 9.8 mg/dL (ref 8.4–10.4)
Chloride: 103 mEq/L (ref 98–107)
Creatinine: 0.9 mg/dL (ref 0.6–1.1)
Potassium: 4.1 mEq/L (ref 3.5–5.1)

## 2013-03-11 LAB — CEA: CEA: 71.9 ng/mL — ABNORMAL HIGH (ref 0.0–5.0)

## 2013-03-11 MED ORDER — HEPARIN SOD (PORK) LOCK FLUSH 100 UNIT/ML IV SOLN
500.0000 [IU] | Freq: Once | INTRAVENOUS | Status: AC
  Administered 2013-03-11: 500 [IU] via INTRAVENOUS
  Filled 2013-03-11: qty 5

## 2013-03-11 MED ORDER — SODIUM CHLORIDE 0.9 % IJ SOLN
10.0000 mL | INTRAMUSCULAR | Status: DC | PRN
  Administered 2013-03-11: 10 mL via INTRAVENOUS
  Filled 2013-03-11: qty 10

## 2013-03-11 NOTE — Telephone Encounter (Signed)
Gave pt appt for MD on 04/01/13 , gave pt oral contrast

## 2013-03-11 NOTE — Progress Notes (Signed)
[facility]    OFFICE PROGRESS NOTE   INTERVAL HISTORY:   She returns as scheduled. No more swelling in the right arm. Occasional nausea and abdominal pain. No difficulty with bowel function. She continues to work. He is not taking pain medication.  Objective:  Vital signs in last 24 hours:  Blood pressure 128/69, pulse 72, temperature 97.8 F (36.6 C), temperature source Oral, resp. rate 18, height 5\' 7"  (1.702 m), weight 156 lb 12.8 oz (71.124 kg).    HEENT: Neck without mass Lymphatics: No cervical or supraclavicular nodes Resp: Lungs clear bilaterally Cardio: Regular rate and rhythm GI: No hepatomegaly, nontender, no mass Vascular: No leg edema. No edema at the right arm.   Portacath/PICC-without erythema  Lab Results:  Lab Results  Component Value Date   WBC 5.8 03/11/2013   HGB 13.7 03/11/2013   HCT 40.1 03/11/2013   MCV 81.8 03/11/2013   PLT 123* 03/11/2013   ANC 4.3    Medications: I have reviewed the patient's current medications.  Assessment/Plan: 1.Metastatic colon cancer with multiple liver metastases noted on an MRI of the abdomen 12/02/2010 and on a CT 12/13/2010. A transverse colon mass was noted on the CT from 02/11/2011 and confirmed on a colonoscopy 12/17/2010. She began treatment with FOLFIRI/Avastin 12/31/2010. A restaging CT 06/20/2011 confirmed further improvement in the metastatic liver lesions and no evidence for progressive metastatic disease. A restaging CT 08/01/2011 showed stable liver lesions and no new lesions. Restaging CT 09/12/2011 showed stable liver lesions and no new lesions. A restaging CT 10/27/2011 showed stable liver lesions and no new lesions. Restaging CT 12/05/2011 showed mild decrease in size of hepatic metastases and no evidence of disease progression. Restaging CT 01/26/2012 showed stable hepatic metastases and no new lesions. Restaging CT may 5/8/ 2013 revealed a decreased size of hepatic metastases and no new  lesions. Restaging CT 04/23/2012 revealed no change in the measurable "target" lesions, a slight increase in the size of other lesions, and no new lesions. Restaging CT 06/04/2012 showed multiple hepatic metastases were grossly unchanged. Restaging CT 07/19/2012 with stable disease in the liver. Infusional 5-fluorouracil continued on a 2 week schedule. Treatment was held after the 08/03/2012 cycle due to thrombocytopenia. Restaging CT evaluation 08/30/2012 with mild increase in size of large hepatic metastases. Treatment was resumed with 5 fluorouracil and Avastin on 08/31/2012. Restaging CT 10/14/2012-stable hepatic metastases. Treatment continued with 5 fluorouracil and Avastin. Restaging CT 12/01/2012 revealed mild progression in the size of multiple hepatic lesions. Treatment was placed on hold.  2. Abdominal pain secondary to hepatomegaly/liver metastases-she now has mild intermittent abdominal pain 3. Anorexia, early satiety, and weight loss secondary to metastatic colon cancer-resolved.  4. Microcytic anemia. Resolved.  5. History of migraine headaches.  6. Status post uterine fibroid surgery.  7. Port-A-Cath placement 12/20/2010.  8. Delayed nausea following cycle 1 of FOLFIRI/Avastin. Improved with the addition of Aloxi and prophylactic Decadron beginning with cycle #2. The prophylactic Decadron was tapered. We increased the day 1 Decadron back to 20 mg.  9. Neutropenia secondary to chemotherapy. Cycle #4 was held on 02/11/2011. She subsequently received Neulasta support.  10. Intermittent fever with no apparent source for infection, likely "tumor fever" or fever related to chemotherapy. No recent fever.  11. Mucositis secondary to 5-FU, initially grade 2. Irinotecan and 5-FU were dose reduced per study guidelines.  12. History of abdominal cramping. ? Related to irinotecan, 5-FU, or potentially the colon tumor. No recent abdominal cramping.  13.  Thrombocytopenia secondary to chemotherapy.  Treatment has been held for thrombocytopenia per study guidelines. The thrombocytopenia may be related to Avastin, or chronic liver disease from chemotherapy. The CT on 04/23/2012 confirmed mild splenomegaly. The CT on 06/04/2012 again showed splenomegaly. The thrombocytopenia persisted despite being off of irinotecan and Avastin for several months. The thrombocytopenia has improved. 14. Acne-like rash on the face. Question related to steroids. Resolved.  15. Arthralgias-most likely unrelated to the colon cancer diagnosis and chemotherapy. She has been evaluated by orthopedics. The etiology of the arthralgias is unclear.  16. Persistent discomfort at the right shoulder. Negative CT of the right upper extremity on 07/19/2012. MRI on 08/06/2012 with a partial thickness bursal surface tear of the supraspinatus tendon, moderate rotator cuff tendinopathy/tendinosis. She is now followed by orthopedics. The pain is stable. The range of motion at the right shoulder has improved 17.? Swelling at the right upper extremity and chest wall April of thousand 14-negative right upper extremity Doppler   Disposition:  She appears stable. We obtained a chemistry panel and CEA today. She will be scheduled for a restaging CT evaluation on 03/31/2013. Ms. Aronov will return for an office visit on 04/01/2013. The Port-A-Cath was flushed today.   Thornton Papas, MD  03/11/2013  9:16 AM

## 2013-03-11 NOTE — Progress Notes (Signed)
03/11/13 [name] Post Treatment visit Natalie Mccarthy is in the cancer center today for lab work and physical exam 3 months after the decision was made to discontinue all treatment on the [name] clinical trial. She reports some continuing fatigue that does not prevent her from working part time and performing all her daily activities. She continues to have some pain in her right shoulder.   Review of medication: She reports that the only medicine she takes routinely is protonix. She still has all the other medications on the list printed this morning but uses them infrequently.    Lab results reviewed by Dr. Truett Perna. He finds abnormal lab results today to be clinically insignificant. He also stated the improving thrombocytopenia was probably related to Avastin.   No new treatment planned at this time.

## 2013-03-19 ENCOUNTER — Ambulatory Visit: Payer: BC Managed Care – PPO

## 2013-03-31 ENCOUNTER — Ambulatory Visit (HOSPITAL_COMMUNITY)
Admission: RE | Admit: 2013-03-31 | Discharge: 2013-03-31 | Disposition: A | Discharge status: Home / Self Care | Payer: BC Managed Care – PPO | Source: Ambulatory Visit | Attending: Oncology | Admitting: Oncology

## 2013-03-31 ENCOUNTER — Encounter (HOSPITAL_COMMUNITY): Payer: Self-pay

## 2013-03-31 DIAGNOSIS — Z79899 Other long term (current) drug therapy: Secondary | ICD-10-CM | POA: Insufficient documentation

## 2013-03-31 DIAGNOSIS — K838 Other specified diseases of biliary tract: Secondary | ICD-10-CM | POA: Insufficient documentation

## 2013-03-31 DIAGNOSIS — C189 Malignant neoplasm of colon, unspecified: Secondary | ICD-10-CM | POA: Insufficient documentation

## 2013-03-31 DIAGNOSIS — C787 Secondary malignant neoplasm of liver and intrahepatic bile duct: Secondary | ICD-10-CM | POA: Insufficient documentation

## 2013-03-31 MED ORDER — IOHEXOL 300 MG/ML  SOLN
100.0000 mL | Freq: Once | INTRAMUSCULAR | Status: AC | PRN
  Administered 2013-03-31: 100 mL via INTRAVENOUS

## 2013-04-01 ENCOUNTER — Telehealth: Payer: Self-pay | Admitting: *Deleted

## 2013-04-01 ENCOUNTER — Ambulatory Visit: Payer: BC Managed Care – PPO | Admitting: Oncology

## 2013-04-01 ENCOUNTER — Telehealth: Payer: Self-pay | Admitting: Oncology

## 2013-04-01 ENCOUNTER — Ambulatory Visit (HOSPITAL_BASED_OUTPATIENT_CLINIC_OR_DEPARTMENT_OTHER): Payer: BC Managed Care – PPO | Admitting: Nurse Practitioner

## 2013-04-01 VITALS — BP 121/82 | HR 82 | Temp 97.2°F | Resp 18 | Ht 67.0 in | Wt 156.3 lb

## 2013-04-01 DIAGNOSIS — R109 Unspecified abdominal pain: Secondary | ICD-10-CM

## 2013-04-01 DIAGNOSIS — C787 Secondary malignant neoplasm of liver and intrahepatic bile duct: Secondary | ICD-10-CM

## 2013-04-01 DIAGNOSIS — C187 Malignant neoplasm of sigmoid colon: Secondary | ICD-10-CM

## 2013-04-01 DIAGNOSIS — D696 Thrombocytopenia, unspecified: Secondary | ICD-10-CM

## 2013-04-01 DIAGNOSIS — C189 Malignant neoplasm of colon, unspecified: Secondary | ICD-10-CM

## 2013-04-01 MED ORDER — TRAMADOL HCL 50 MG PO TABS: 50.0000 mg | ORAL_TABLET | Freq: Four times a day (QID) | ORAL | Status: DC | PRN

## 2013-04-01 NOTE — Telephone Encounter (Signed)
Called pt and left message regarding chemo for June and July 2014

## 2013-04-01 NOTE — Telephone Encounter (Signed)
Per staff phone call and POF I have schedueld appts.  JMW  

## 2013-04-01 NOTE — Telephone Encounter (Signed)
Gave pt appt for November 2014 lab and MD °

## 2013-04-01 NOTE — Progress Notes (Signed)
OFFICE PROGRESS NOTE  Interval history:  Natalie Mccarthy returns as scheduled. Restaging CT evaluation 03/31/2013 showed interval progression of liver metastases. A target lesion in the central right hepatic lobe measured 58 mm as compared to 49 mm previously. A lesion in the medial segment of the left hepatic lobe measured 23 mm as compared to 20 mm previously. There was no evidence of extrahepatic metastatic disease.  She overall feels well. She has a good appetite and good energy level. No nausea or vomiting. Bowels moving regularly. Over the past month she has noted periodic abdominal pain in various locations lasting up to a few hours. She has taken Ultram on a few occasions.   Objective: Blood pressure 121/82, pulse 82, temperature 97.2 F (36.2 C), temperature source Oral, resp. rate 18, height 5\' 7"  (1.702 m), weight 156 lb 4.8 oz (70.897 kg).  Oropharynx is without thrush or ulceration. Lungs are clear. Regular cardiac rhythm. Port-A-Cath site is without erythema. Abdomen is soft and nontender. No hepatomegaly. No mass. Extremities without edema.  Lab Results: Lab Results  Component Value Date   WBC 5.8 03/11/2013   HGB 13.7 03/11/2013   HCT 40.1 03/11/2013   MCV 81.8 03/11/2013   PLT 123* 03/11/2013    Chemistry:    Chemistry      Component Value Date/Time   NA 140 03/11/2013 0817   NA 139 06/22/2012 1108   NA 143 01/27/2012 1354   K 4.1 03/11/2013 0817   K 3.8 06/22/2012 1108   K 3.8 01/27/2012 1354   CL 103 03/11/2013 0817   CL 103 06/22/2012 1108   CL 97* 01/27/2012 1354   CO2 26 03/11/2013 0817   CO2 31 06/22/2012 1108   CO2 29 01/27/2012 1354   BUN 14.4 03/11/2013 0817   BUN 19 06/22/2012 1108   BUN 11 01/27/2012 1354   CREATININE 0.9 03/11/2013 0817   CREATININE 0.75 06/22/2012 1108   CREATININE 0.9 01/27/2012 1354      Component Value Date/Time   CALCIUM 9.8 03/11/2013 0817   CALCIUM 9.7 06/22/2012 1108   CALCIUM 8.9 01/27/2012 1354   ALKPHOS 266* 03/11/2013 0817   ALKPHOS 185* 06/22/2012  1108   ALKPHOS 198* 01/27/2012 1354   AST 43* 03/11/2013 0817   AST 36 06/22/2012 1108   AST 34 01/27/2012 1354   ALT 35 03/11/2013 0817   ALT 36* 06/22/2012 1108   BILITOT 0.93 03/11/2013 0817   BILITOT 0.5 06/22/2012 1108   BILITOT 0.50 01/27/2012 1354       Studies/Results: Ct Abdomen Pelvis W Contrast  03/31/2013   *RADIOLOGY REPORT*  Clinical Data: Restaging of metastatic colon carcinoma. Chemotherapy in progress.  CT ABDOMEN AND PELVIS WITH CONTRAST  Technique:  Multidetector CT imaging of the abdomen and pelvis was performed following the standard protocol during bolus administration of intravenous contrast.  Contrast: OMNIPAQUE IOHEXOL 300 MG/ML  SOLN  Comparison: Abdomen CT only on 12/01/2012  RECIST protocol 1.1  Target Lesions:  1.Lesion in the central right hepatic lobe measures 58 mm on image 14 of series 2 compared to 49 mm previously 2.  Lesion in the medial segment of the left hepatic lobe measures 23 mm (image 16 of series 2) compared to 20 mm previously  Non Target lesions:  1. Lesion in the posterior segment of the right hepatic lobe on image 11 of series 2 measures 39 mm compared to 18 mm previously  Findings: Multiple liver metastases are seen which show progression in size since  prior. Those in the dome of the liver now have become more coalescent.  One index lesion in the dome of the right lobe currently measures 4.4 x 5.4 cm on image 8 compared to 3.0 x 4.3 cm previously.  There is now mild peripheral biliary ductal dilatation seen in the medial segment left hepatic lobe peripheral to large liver metastasis.  The pancreas, spleen, adrenal glands, and kidneys remain normal appearance.  No other soft tissue masses or lymphadenopathy identified within the abdomen or pelvis.  Uterus and adnexa are unremarkable.  No evidence of inflammatory process or abscess.  No evidence of bowel wall thickening or dilatation.  No suspicious bone lesions are identified.  IMPRESSION:  1.  Further  interval progression of the liver metastases. 2.  No evidence of extrahepatic metastatic or recurrent carcinoma.   Original Report Authenticated By: Myles Rosenthal, M.D.    Medications: I have reviewed the patient's current medications.  Assessment/Plan:  1.Metastatic colon cancer with multiple liver metastases noted on an MRI of the abdomen 12/02/2010 and on a CT 12/13/2010. A transverse colon mass was noted on the CT from 02/11/2011 and confirmed on a colonoscopy 12/17/2010. She began treatment with FOLFIRI/Avastin 12/31/2010. A restaging CT 06/20/2011 confirmed further improvement in the metastatic liver lesions and no evidence for progressive metastatic disease. A restaging CT 08/01/2011 showed stable liver lesions and no new lesions. Restaging CT 09/12/2011 showed stable liver lesions and no new lesions. A restaging CT 10/27/2011 showed stable liver lesions and no new lesions. Restaging CT 12/05/2011 showed mild decrease in size of hepatic metastases and no evidence of disease progression. Restaging CT 01/26/2012 showed stable hepatic metastases and no new lesions. Restaging CT may 5/8/ 2013 revealed a decreased size of hepatic metastases and no new lesions. Restaging CT 04/23/2012 revealed no change in the measurable "target" lesions, a slight increase in the size of other lesions, and no new lesions. Restaging CT 06/04/2012 showed multiple hepatic metastases were grossly unchanged. Restaging CT 07/19/2012 with stable disease in the liver. Infusional 5-fluorouracil continued on a 2 week schedule. Treatment was held after the 08/03/2012 cycle due to thrombocytopenia. Restaging CT evaluation 08/30/2012 with mild increase in size of large hepatic metastases. Treatment was resumed with 5 fluorouracil and Avastin on 08/31/2012. Restaging CT 10/14/2012-stable hepatic metastases. Treatment continued with 5 fluorouracil and Avastin. Restaging CT 12/01/2012 revealed mild progression in the size of multiple hepatic  lesions. Treatment was placed on hold. CEA 32.1 on 01/06/2013 and 71.9 on 03/11/2013. Restaging CT abdomen/pelvis 03/31/2013 with interval progression of liver metastases. 2. Abdominal pain secondary to hepatomegaly/liver metastases-she now has mild intermittent abdominal pain.  3. Anorexia, early satiety, and weight loss secondary to metastatic colon cancer-resolved.  4. Microcytic anemia. Resolved.  5. History of migraine headaches.  6. Status post uterine fibroid surgery.  7. Port-A-Cath placement 12/20/2010.  8. Delayed nausea following cycle 1 of FOLFIRI/Avastin. Improved with the addition of Aloxi and prophylactic Decadron beginning with cycle #2. The prophylactic Decadron was tapered. We increased the day 1 Decadron back to 20 mg.  9. Neutropenia secondary to chemotherapy. Cycle #4 was held on 02/11/2011. She subsequently received Neulasta support.  10. History of intermittent fever with no apparent source for infection, likely "tumor fever" or fever related to chemotherapy. No recent fever.  11. Mucositis secondary to 5-FU, initially grade 2. Irinotecan and 5-FU were dose reduced per study guidelines.  12. History of abdominal cramping. ? Related to irinotecan, 5-FU, or potentially the colon tumor. No recent abdominal  cramping.  13. Thrombocytopenia secondary to chemotherapy. Treatment has been held for thrombocytopenia per study guidelines. The thrombocytopenia may be related to Avastin, or chronic liver disease from chemotherapy. The CT on 04/23/2012 confirmed mild splenomegaly. The CT on 06/04/2012 again showed splenomegaly. The thrombocytopenia persisted despite being off of irinotecan and Avastin for several months. The thrombocytopenia has improved.  14. Acne-like rash on the face. Question related to steroids. Resolved.  15. Arthralgias-most likely unrelated to the colon cancer diagnosis and chemotherapy. She has been evaluated by orthopedics. The etiology of the arthralgias is unclear.   16. Persistent discomfort at the right shoulder. Negative CT of the right upper extremity on 07/19/2012. MRI on 08/06/2012 with a partial thickness bursal surface tear of the supraspinatus tendon, moderate rotator cuff tendinopathy/tendinosis. She is now followed by orthopedics. The pain is stable. The range of motion at the right shoulder has improved  17.? Swelling at the right upper extremity and chest wall April 2014-negative right upper extremity Doppler.  Disposition-Natalie Mccarthy has metastatic colon cancer. Recent restaging CT abdomen/pelvis showed evidence of progressive metastatic disease involving the liver. Dr. Truett Perna recommends initiation of an oxaliplatin-based regimen. We discussed the FOLFOX and CAPOX regimens. She prefers the CAPOX regimen. We reviewed potential toxicities associated with Xeloda including mouth sores, nausea, diarrhea, hand-foot syndrome, skin hyperpigmentation. We discussed potential toxicities associated with oxaliplatin including nausea, an allergic reaction, neurotoxicity including cold sensitivity, peripheral neuropathy, acute laryngopharyngeal dysesthesia. We also discussed the possibility of acute neurotoxicity with such occurrences as ataxia and diplopia. She is agreeable to proceed. Her son is graduating from high school on 04/18/2013. Following graduation they are going on vacation for one week. She would like to begin cycle 1 on 04/26/2013. We will see her in followup prior to treatment that day. She will contact the office in the interim with any problems. We specifically discussed worsening abdominal pain.  Patient was seen with Dr. Truett Perna.   Lonna Cobb ANP/GNP-BC

## 2013-04-01 NOTE — Progress Notes (Signed)
KRAS testing on Accession #: [id number], per order from Dr. Truett Perna, given to Linus Mako in pathology.

## 2013-04-05 ENCOUNTER — Telehealth: Payer: Self-pay | Admitting: *Deleted

## 2013-04-05 NOTE — Telephone Encounter (Signed)
Reports having about #5 doses of Tramadol and her hands started itching. No rash or dyspnea. Stopped taking med and itching almost resolved. Asking what to do? Suggested she wait until all itching is gone and try Tramadol again. If itching returns, then call office and we will document this as allergy and find another analgesic for her.

## 2013-04-06 ENCOUNTER — Telehealth: Payer: Self-pay | Admitting: *Deleted

## 2013-04-06 ENCOUNTER — Other Ambulatory Visit: Payer: Self-pay | Admitting: *Deleted

## 2013-04-06 ENCOUNTER — Ambulatory Visit (HOSPITAL_BASED_OUTPATIENT_CLINIC_OR_DEPARTMENT_OTHER): Payer: BC Managed Care – PPO | Admitting: Lab

## 2013-04-06 DIAGNOSIS — C189 Malignant neoplasm of colon, unspecified: Secondary | ICD-10-CM

## 2013-04-06 DIAGNOSIS — C187 Malignant neoplasm of sigmoid colon: Secondary | ICD-10-CM

## 2013-04-06 DIAGNOSIS — C787 Secondary malignant neoplasm of liver and intrahepatic bile duct: Secondary | ICD-10-CM

## 2013-04-06 LAB — URINALYSIS, MICROSCOPIC - CHCC
Glucose: NEGATIVE mg/dL
Leukocyte Esterase: NEGATIVE
Nitrite: NEGATIVE
Protein: NEGATIVE mg/dL
Specific Gravity, Urine: 1.005 (ref 1.003–1.035)
Urobilinogen, UR: 0.2 mg/dL (ref 0.2–1)
WBC, UA: NEGATIVE (ref 0–2)

## 2013-04-06 LAB — COMPREHENSIVE METABOLIC PANEL (CC13)
AST: 115 U/L — ABNORMAL HIGH (ref 5–34)
Albumin: 3.7 g/dL (ref 3.5–5.0)
Alkaline Phosphatase: 577 U/L — ABNORMAL HIGH (ref 40–150)
BUN: 14.4 mg/dL (ref 7.0–26.0)
Creatinine: 0.9 mg/dL (ref 0.6–1.1)
Glucose: 101 mg/dl — ABNORMAL HIGH (ref 70–99)
Total Bilirubin: 2.3 mg/dL — ABNORMAL HIGH (ref 0.20–1.20)

## 2013-04-06 NOTE — Telephone Encounter (Signed)
Pt aware of appt for 04/07/13 MD only

## 2013-04-06 NOTE — Telephone Encounter (Signed)
Message from [place] with Dr. Ewing Schlein. Dr. Ewing Schlein has reviewed lab and scan. Can see pt this week if needed, have Dr. Truett Perna call after office visit. Message to Dr. Truett Perna.

## 2013-04-06 NOTE — Telephone Encounter (Signed)
CMET reviewed with Dr. Truett Perna. Order received to schedule pt for office visit 6/5. Called pt with this info. She voiced understanding and is in agreement. Called Dr. Marlane Hatcher office, requested appt, they will contact pt.

## 2013-04-06 NOTE — Telephone Encounter (Signed)
Call from pt reporting itching since beginning Tramadol 6/1. Took Benadryl which helped her sleep. Noticed her urine is dark orange as well. Asking if Tramadol could be causing this? Reviewed with Dr. Truett Perna: order received to work pt in for lab today. Called pt with instructions to come in for lab, she agrees. Requested UA to be done as well.

## 2013-04-07 ENCOUNTER — Encounter (HOSPITAL_COMMUNITY): Payer: Self-pay | Admitting: *Deleted

## 2013-04-07 ENCOUNTER — Ambulatory Visit (HOSPITAL_BASED_OUTPATIENT_CLINIC_OR_DEPARTMENT_OTHER): Payer: BC Managed Care – PPO | Admitting: Oncology

## 2013-04-07 VITALS — BP 127/71 | HR 74 | Temp 97.9°F | Resp 18 | Ht 67.0 in | Wt 154.6 lb

## 2013-04-07 DIAGNOSIS — C189 Malignant neoplasm of colon, unspecified: Secondary | ICD-10-CM

## 2013-04-07 DIAGNOSIS — R17 Unspecified jaundice: Secondary | ICD-10-CM

## 2013-04-07 DIAGNOSIS — C184 Malignant neoplasm of transverse colon: Secondary | ICD-10-CM

## 2013-04-07 DIAGNOSIS — T451X5A Adverse effect of antineoplastic and immunosuppressive drugs, initial encounter: Secondary | ICD-10-CM

## 2013-04-07 DIAGNOSIS — C787 Secondary malignant neoplasm of liver and intrahepatic bile duct: Secondary | ICD-10-CM

## 2013-04-07 DIAGNOSIS — D702 Other drug-induced agranulocytosis: Secondary | ICD-10-CM

## 2013-04-07 HISTORY — DX: Adverse effect of antineoplastic and immunosuppressive drugs, initial encounter: T45.1X5A

## 2013-04-07 MED ORDER — ONDANSETRON HCL 8 MG PO TABS: 8.0000 mg | ORAL_TABLET | Freq: Two times a day (BID) | ORAL | Status: DC | PRN

## 2013-04-07 MED ORDER — HYDROXYZINE HCL 25 MG PO TABS: 25.0000 mg | ORAL_TABLET | Freq: Four times a day (QID) | ORAL | Status: DC | PRN

## 2013-04-07 NOTE — Progress Notes (Signed)
[facility]    OFFICE PROGRESS NOTE   INTERVAL HISTORY:   She returns prior to a scheduled visit. She noted the onset of dark urine 04/03/2013. She describes the urine as "orange ". On 04/04/2013 she she began to have diffuse pruritus. This has persisted. Benadryl caused "restless "legs. She otherwise feels well.  Objective:  Vital signs in last 24 hours:  Blood pressure 127/71, pulse 74, temperature 97.9 F (36.6 C), temperature source Oral, resp. rate 18, height 5\' 7"  (1.702 m), weight 154 lb 9.6 oz (70.126 kg), SpO2 100.00%.    HEENT: Sclera anicteric Resp: Lungs clear bilaterally Cardio: Regular rate and rhythm GI: No hepatomegaly, nontender Vascular: No leg edema  Skin: No apparent jaundice, multiple abrasions at sites of scratching   Portacath/PICC-without erythema  Lab Results:  Lab Results  Component Value Date   WBC 5.8 03/11/2013   HGB 13.7 03/11/2013   HCT 40.1 03/11/2013   MCV 81.8 03/11/2013   PLT 123* 03/11/2013   04/06/2013-bilirubin 2.3, alkaline phosphatase 577, AST 115, ALT 185   Medications: I have reviewed the patient's current medications.  Assessment/Plan: 1.Metastatic colon cancer with multiple liver metastases noted on an MRI of the abdomen 12/02/2010 and on a CT 12/13/2010. A transverse colon mass was noted on the CT from 02/11/2011 and confirmed on a colonoscopy 12/17/2010. She began treatment with FOLFIRI/Avastin 12/31/2010. A restaging CT 06/20/2011 confirmed further improvement in the metastatic liver lesions and no evidence for progressive metastatic disease. A restaging CT 08/01/2011 showed stable liver lesions and no new lesions. Restaging CT 09/12/2011 showed stable liver lesions and no new lesions. A restaging CT 10/27/2011 showed stable liver lesions and no new lesions. Restaging CT 12/05/2011 showed mild decrease in size of hepatic metastases and no evidence of disease progression. Restaging CT 01/26/2012 showed stable hepatic  metastases and no new lesions. Restaging CT may 5/8/ 2013 revealed a decreased size of hepatic metastases and no new lesions. Restaging CT 04/23/2012 revealed no change in the measurable "target" lesions, a slight increase in the size of other lesions, and no new lesions. Restaging CT 06/04/2012 showed multiple hepatic metastases were grossly unchanged. Restaging CT 07/19/2012 with stable disease in the liver. Infusional 5-fluorouracil continued on a 2 week schedule. Treatment was held after the 08/03/2012 cycle due to thrombocytopenia. Restaging CT evaluation 08/30/2012 with mild increase in size of large hepatic metastases. Treatment was resumed with 5 fluorouracil and Avastin on 08/31/2012. Restaging CT 10/14/2012-stable hepatic metastases. Treatment continued with 5 fluorouracil and Avastin. Restaging CT 12/01/2012 revealed mild progression in the size of multiple hepatic lesions. Treatment was placed on hold. CEA 32.1 on 01/06/2013 and 71.9 on 03/11/2013. Restaging CT abdomen/pelvis 03/31/2013 with interval progression of liver metastases.  2. Abdominal pain secondary to hepatomegaly/liver metastases-she now has mild intermittent abdominal pain.  3. Anorexia, early satiety, and weight loss secondary to metastatic colon cancer-resolved.  4. Microcytic anemia. Resolved.  5. History of migraine headaches.  6. Status post uterine fibroid surgery.  7. Port-A-Cath placement 12/20/2010.  8. Delayed nausea following cycle 1 of FOLFIRI/Avastin. Improved with the addition of Aloxi and prophylactic Decadron beginning with cycle #2. The prophylactic Decadron was tapered. We increased the day 1 Decadron back to 20 mg.  9. Neutropenia secondary to chemotherapy. Cycle #4 was held on 02/11/2011. She subsequently received Neulasta support.  10. History of intermittent fever with no apparent source for infection, likely "tumor fever" or fever related to chemotherapy. No recent fever.  11. Mucositis secondary  to 5-FU,  initially grade 2. Irinotecan and 5-FU were dose reduced per study guidelines.  12. History of abdominal cramping. ? Related to irinotecan, 5-FU, or potentially the colon tumor. No recent abdominal cramping.  13. Thrombocytopenia secondary to chemotherapy. Treatment has been held for thrombocytopenia per study guidelines. The thrombocytopenia may be related to Avastin, or chronic liver disease from chemotherapy. The CT on 04/23/2012 confirmed mild splenomegaly. The CT on 06/04/2012 again showed splenomegaly. The thrombocytopenia persisted despite being off of irinotecan and Avastin for several months. The thrombocytopenia has improved.  14. Acne-like rash on the face. Question related to steroids. Resolved.  15. Arthralgias-most likely unrelated to the colon cancer diagnosis and chemotherapy. She has been evaluated by orthopedics. The etiology of the arthralgias is unclear.  16. Persistent discomfort at the right shoulder. Negative CT of the right upper extremity on 07/19/2012. MRI on 08/06/2012 with a partial thickness bursal surface tear of the supraspinatus tendon, moderate rotator cuff tendinopathy/tendinosis. She is now followed by orthopedics. The pain is stable. The range of motion at the right shoulder has improved  17.? Swelling at the right upper extremity and chest wall April 2014-negative right upper extremity Doppler.  18. New onset pruritus 04/04/2013-hyperbilirubinemia and an elevated alkaline phosphatase noted on labs 04/06/2013, the CT 03/31/2013 revealed peripheral biliary ductal dilatation in the medial segment of the left hepatic lobe  Disposition:  She has developed hyperbilirubinemia and pruritus. I discussed the case with Dr. Ewing Schlein. He has reviewed the CT from 03/31/2013 and feels it may be difficult to relieve the biliary obstruction with either an ERCP or percutaneous approach. He will see Ms. Marban today. We will consider initiating FOLFOX or CAPOX if the biliary obstruction  cannot be stented. She will begin a trial of hydroxyzine for the pruritus.   Thornton Papas, MD  04/07/2013  12:38 PM

## 2013-04-08 ENCOUNTER — Other Ambulatory Visit: Payer: Self-pay | Admitting: Gastroenterology

## 2013-04-08 ENCOUNTER — Telehealth: Payer: Self-pay | Admitting: *Deleted

## 2013-04-08 NOTE — Addendum Note (Signed)
Addended byVida Rigger on: 04/08/2013 05:30 PM   Modules accepted: Orders

## 2013-04-08 NOTE — Telephone Encounter (Signed)
Reports that hydroxyzine works well for her itching but causes significant sedation. Asking if there is anything as effective that does not sedate so much? If so, please call to pharmacy.

## 2013-04-11 ENCOUNTER — Telehealth: Payer: Self-pay | Admitting: *Deleted

## 2013-04-11 NOTE — OR Nursing (Signed)
Called patient regarding time change for 04/12/2013 procedure per Dr. Ewing Schlein request.  Patient agreed to arrive at 7:00 for 8:30 procedure.

## 2013-04-11 NOTE — Telephone Encounter (Signed)
Left message on voicemail in response to pt's question from 6/6. Per Dr. Truett Perna: Try taking half dose of Hydroxyzine to see if this makes her less drowsy. Pt is scheduled for ERCP 04/12/13.

## 2013-04-12 ENCOUNTER — Ambulatory Visit (HOSPITAL_COMMUNITY): Payer: BC Managed Care – PPO

## 2013-04-12 ENCOUNTER — Observation Stay (HOSPITAL_COMMUNITY)
Admission: RE | Admit: 2013-04-12 | Discharge: 2013-04-13 | Disposition: A | Discharge status: Home / Self Care | Payer: BC Managed Care – PPO | Source: Ambulatory Visit | Attending: Gastroenterology | Admitting: Gastroenterology

## 2013-04-12 ENCOUNTER — Encounter (HOSPITAL_COMMUNITY)
Admission: RE | Disposition: A | Discharge status: Home / Self Care | Payer: Self-pay | Source: Ambulatory Visit | Attending: Gastroenterology

## 2013-04-12 ENCOUNTER — Encounter (HOSPITAL_COMMUNITY): Payer: Self-pay | Admitting: *Deleted

## 2013-04-12 ENCOUNTER — Encounter (HOSPITAL_COMMUNITY): Payer: Self-pay | Admitting: Anesthesiology

## 2013-04-12 ENCOUNTER — Ambulatory Visit (HOSPITAL_COMMUNITY): Payer: BC Managed Care – PPO | Admitting: Anesthesiology

## 2013-04-12 ENCOUNTER — Other Ambulatory Visit: Payer: Self-pay | Admitting: *Deleted

## 2013-04-12 ENCOUNTER — Telehealth: Payer: Self-pay | Admitting: *Deleted

## 2013-04-12 DIAGNOSIS — C787 Secondary malignant neoplasm of liver and intrahepatic bile duct: Secondary | ICD-10-CM | POA: Insufficient documentation

## 2013-04-12 DIAGNOSIS — Z79899 Other long term (current) drug therapy: Secondary | ICD-10-CM | POA: Insufficient documentation

## 2013-04-12 DIAGNOSIS — K838 Other specified diseases of biliary tract: Principal | ICD-10-CM | POA: Insufficient documentation

## 2013-04-12 DIAGNOSIS — C189 Malignant neoplasm of colon, unspecified: Secondary | ICD-10-CM | POA: Insufficient documentation

## 2013-04-12 DIAGNOSIS — R110 Nausea: Secondary | ICD-10-CM | POA: Insufficient documentation

## 2013-04-12 HISTORY — DX: Nausea with vomiting, unspecified: R11.2

## 2013-04-12 HISTORY — DX: Adverse effect of antineoplastic and immunosuppressive drugs, initial encounter: T45.1X5A

## 2013-04-12 HISTORY — PX: ERCP: SHX5425

## 2013-04-12 HISTORY — DX: Other specified postprocedural states: Z98.890

## 2013-04-12 LAB — CBC WITH DIFFERENTIAL/PLATELET
Basophils Absolute: 0 10*3/uL (ref 0.0–0.1)
Eosinophils Absolute: 0 10*3/uL (ref 0.0–0.7)
Eosinophils Relative: 0 % (ref 0–5)
HCT: 40.4 % (ref 36.0–46.0)
Lymphs Abs: 0.3 10*3/uL — ABNORMAL LOW (ref 0.7–4.0)
MCH: 28 pg (ref 26.0–34.0)
MCV: 83.1 fL (ref 78.0–100.0)
Monocytes Absolute: 0.2 10*3/uL (ref 0.1–1.0)
Platelets: 127 10*3/uL — ABNORMAL LOW (ref 150–400)
RDW: 15.1 % (ref 11.5–15.5)

## 2013-04-12 LAB — COMPREHENSIVE METABOLIC PANEL
ALT: 215 U/L — ABNORMAL HIGH (ref 0–35)
CO2: 25 mEq/L (ref 19–32)
Calcium: 9.5 mg/dL (ref 8.4–10.5)
Creatinine, Ser: 0.61 mg/dL (ref 0.50–1.10)
GFR calc Af Amer: >90 mL/min (ref >90)
GFR calc non Af Amer: >90 mL/min (ref >90)
Glucose, Bld: 183 mg/dL — ABNORMAL HIGH (ref 70–99)
Sodium: 134 mEq/L — ABNORMAL LOW (ref 135–145)
Total Protein: 7.9 g/dL (ref 6.0–8.3)

## 2013-04-12 LAB — TYPE AND SCREEN: Antibody Screen: NEGATIVE

## 2013-04-12 LAB — ABO/RH: ABO/RH(D): O NEG

## 2013-04-12 SURGERY — ERCP, WITH INTERVENTION IF INDICATED
Anesthesia: General

## 2013-04-12 MED ORDER — CAPECITABINE 500 MG PO TABS: 1500.0000 mg | ORAL_TABLET | Freq: Two times a day (BID) | ORAL | Status: DC

## 2013-04-12 MED ORDER — ENOXAPARIN SODIUM 40 MG/0.4ML ~~LOC~~ SOLN
40.0000 mg | SUBCUTANEOUS | Status: DC
  Filled 2013-04-12 (×2): qty 0.4

## 2013-04-12 MED ORDER — HYDROXYZINE HCL 25 MG PO TABS
25.0000 mg | ORAL_TABLET | Freq: Three times a day (TID) | ORAL | Status: DC | PRN
  Filled 2013-04-12: qty 1

## 2013-04-12 MED ORDER — LACTATED RINGERS IV SOLN
INTRAVENOUS | Status: DC
  Administered 2013-04-12: 08:00:00 via INTRAVENOUS

## 2013-04-12 MED ORDER — ACETAMINOPHEN 325 MG PO TABS
650.0000 mg | ORAL_TABLET | Freq: Four times a day (QID) | ORAL | Status: DC | PRN
  Filled 2013-04-12: qty 2

## 2013-04-12 MED ORDER — ENOXAPARIN SODIUM 40 MG/0.4ML ~~LOC~~ SOLN: 40.0000 mg | SUBCUTANEOUS | Status: DC

## 2013-04-12 MED ORDER — PROMETHAZINE HCL 25 MG/ML IJ SOLN
INTRAMUSCULAR | Status: DC | PRN
  Administered 2013-04-12: 6 mg via INTRAVENOUS

## 2013-04-12 MED ORDER — ACETAMINOPHEN 650 MG RE SUPP
650.0000 mg | Freq: Four times a day (QID) | RECTAL | Status: DC | PRN
  Filled 2013-04-12: qty 1

## 2013-04-12 MED ORDER — OXYCODONE HCL 5 MG PO TABS: 5.0000 mg | ORAL_TABLET | ORAL | Status: DC | PRN

## 2013-04-12 MED ORDER — CIPROFLOXACIN IN D5W 400 MG/200ML IV SOLN
400.0000 mg | Freq: Once | INTRAVENOUS | Status: AC
  Administered 2013-04-12 (×2): 400 mg via INTRAVENOUS

## 2013-04-12 MED ORDER — LIDOCAINE HCL (CARDIAC) 20 MG/ML IV SOLN
INTRAVENOUS | Status: DC | PRN
  Administered 2013-04-12: 50 mg via INTRAVENOUS

## 2013-04-12 MED ORDER — FENTANYL CITRATE 0.05 MG/ML IJ SOLN
INTRAMUSCULAR | Status: DC | PRN
  Administered 2013-04-12: 50 ug via INTRAVENOUS
  Administered 2013-04-12: 25 ug via INTRAVENOUS
  Administered 2013-04-12: 100 ug via INTRAVENOUS
  Administered 2013-04-12: 25 ug via INTRAVENOUS

## 2013-04-12 MED ORDER — DEXTROSE-NACL 5-0.45 % IV SOLN: INTRAVENOUS | Status: DC

## 2013-04-12 MED ORDER — PROPOFOL 10 MG/ML IV BOLUS
INTRAVENOUS | Status: DC | PRN
  Administered 2013-04-12: 150 mg via INTRAVENOUS

## 2013-04-12 MED ORDER — METOCLOPRAMIDE HCL 5 MG/ML IJ SOLN
INTRAMUSCULAR | Status: DC | PRN
  Administered 2013-04-12: 10 mg via INTRAVENOUS

## 2013-04-12 MED ORDER — LACTATED RINGERS IV SOLN: INTRAVENOUS | Status: DC

## 2013-04-12 MED ORDER — ONDANSETRON HCL 4 MG/2ML IJ SOLN
INTRAMUSCULAR | Status: DC | PRN
  Administered 2013-04-12 (×2): 4 mg via INTRAVENOUS

## 2013-04-12 MED ORDER — SODIUM CHLORIDE 0.9 % IV SOLN
INTRAVENOUS | Status: DC
  Administered 2013-04-12: 250 mL via INTRAVENOUS

## 2013-04-12 MED ORDER — SODIUM CHLORIDE 0.9 % IV SOLN
INTRAVENOUS | Status: DC | PRN
  Administered 2013-04-12: 10:00:00

## 2013-04-12 MED ORDER — PANTOPRAZOLE SODIUM 40 MG IV SOLR
40.0000 mg | Freq: Two times a day (BID) | INTRAVENOUS | Status: DC
  Administered 2013-04-12: 40 mg via INTRAVENOUS
  Filled 2013-04-12 (×3): qty 40

## 2013-04-12 MED ORDER — HEPARIN SOD (PORK) LOCK FLUSH 100 UNIT/ML IV SOLN: 500.0000 [IU] | INTRAVENOUS | Status: AC | PRN

## 2013-04-12 MED ORDER — DEXAMETHASONE SODIUM PHOSPHATE 10 MG/ML IJ SOLN
INTRAMUSCULAR | Status: DC | PRN
  Administered 2013-04-12: 4 mg via INTRAVENOUS

## 2013-04-12 MED ORDER — PROMETHAZINE HCL 25 MG/ML IJ SOLN
INTRAMUSCULAR | Status: AC
  Filled 2013-04-12: qty 1

## 2013-04-12 MED ORDER — SUCCINYLCHOLINE CHLORIDE 20 MG/ML IJ SOLN
INTRAMUSCULAR | Status: DC | PRN
  Administered 2013-04-12: 100 mg via INTRAVENOUS

## 2013-04-12 MED ORDER — CIPROFLOXACIN IN D5W 400 MG/200ML IV SOLN
INTRAVENOUS | Status: AC
  Filled 2013-04-12: qty 200

## 2013-04-12 MED ORDER — MEPERIDINE HCL 25 MG/ML IJ SOLN: 6.2500 mg | INTRAMUSCULAR | Status: DC | PRN

## 2013-04-12 MED ORDER — ONDANSETRON HCL 4 MG/2ML IJ SOLN
INTRAMUSCULAR | Status: AC
  Filled 2013-04-12: qty 2

## 2013-04-12 MED ORDER — MIDAZOLAM HCL 5 MG/5ML IJ SOLN
INTRAMUSCULAR | Status: DC | PRN
  Administered 2013-04-12: 2 mg via INTRAVENOUS

## 2013-04-12 MED ORDER — MORPHINE SULFATE 2 MG/ML IJ SOLN: 1.0000 mg | INTRAMUSCULAR | Status: DC | PRN

## 2013-04-12 NOTE — Telephone Encounter (Signed)
Per staff phone call and POF I have schedueld appts.  JMW  

## 2013-04-12 NOTE — Progress Notes (Signed)
Natalie Mccarthy 7:58 AM  Subjective: Patient says her hydroxyzine works but makes her to sleepy but she has no other complaints or problems since I saw her the end of last week in the office and her skin color is not obviously worse  Objective: Vital signs stable afebrile no acute distress exam please see pre-assessment evaluation  Assessment: Obstructive jaundice due to metastatic colon cancer  Plan: Okay to proceed with anesthesia and attempt at ERCP and stenting today but if unsuccessful trial of Colestid and then possibly Urso and had oncology start a different chemotherapy in an effort to shrink the tumor and decrease obstruction  Natalie Mccarthy E

## 2013-04-12 NOTE — Transfer of Care (Signed)
Immediate Anesthesia Transfer of Care Note  Patient: Natalie Mccarthy  Procedure(s) Performed: Procedure(s): ENDOSCOPIC RETROGRADE CHOLANGIOPANCREATOGRAPHY (ERCP) (N/A) BILIARY STENT PLACEMENT (N/A)  Patient Location: PACU and Endoscopy Unit  Anesthesia Type:General  Level of Consciousness: awake, alert , oriented and patient cooperative  Airway & Oxygen Therapy: Patient Spontanous Breathing and Patient connected to face mask oxygen  Post-op Assessment: Report given to PACU RN, Post -op Vital signs reviewed and stable and Patient moving all extremities  Post vital signs: Reviewed and stable  Complications: No apparent anesthesia complications

## 2013-04-12 NOTE — Anesthesia Postprocedure Evaluation (Signed)
  Anesthesia Post-op Note  Patient: Natalie Mccarthy  Procedure(s) Performed: Procedure(s) (LRB): ENDOSCOPIC RETROGRADE CHOLANGIOPANCREATOGRAPHY (ERCP) (N/A) BILIARY STENT PLACEMENT (N/A)  Patient Location: PACU  Anesthesia Type: General  Level of Consciousness: awake and alert   Airway and Oxygen Therapy: Patient Spontanous Breathing  Post-op Pain: mild  Post-op Assessment: Post-op Vital signs reviewed, Patient's Cardiovascular Status Stable, Respiratory Function Stable, Patent Airway and No signs of Nausea or vomiting  Last Vitals:  Filed Vitals:   04/12/13 1300  BP: 135/76  Pulse:   Temp:   Resp: 15    Post-op Vital Signs: stable   Complications: No apparent anesthesia complications

## 2013-04-12 NOTE — Anesthesia Preprocedure Evaluation (Addendum)
Anesthesia Evaluation  Patient identified by MRN, date of birth, ID band Patient awake    Reviewed: Allergy & Precautions, H&P , NPO status , Patient's Chart, lab work & pertinent test results  History of Anesthesia Complications (+) PONV  Airway Mallampati: II TM Distance: >3 FB Neck ROM: Full    Dental no notable dental hx.    Pulmonary neg pulmonary ROS,  breath sounds clear to auscultation  Pulmonary exam normal       Cardiovascular negative cardio ROS  Rhythm:Regular Rate:Normal     Neuro/Psych negative neurological ROS  negative psych ROS   GI/Hepatic negative GI ROS, Neg liver ROS,   Endo/Other  negative endocrine ROS  Renal/GU negative Renal ROS  negative genitourinary   Musculoskeletal negative musculoskeletal ROS (+)   Abdominal   Peds negative pediatric ROS (+)  Hematology negative hematology ROS (+)   Anesthesia Other Findings   Reproductive/Obstetrics negative OB ROS                           Anesthesia Physical Anesthesia Plan  ASA: II  Anesthesia Plan: General   Post-op Pain Management:    Induction:   Airway Management Planned: Oral ETT  Additional Equipment:   Intra-op Plan:   Post-operative Plan:   Informed Consent: I have reviewed the patients History and Physical, chart, labs and discussed the procedure including the risks, benefits and alternatives for the proposed anesthesia with the patient or authorized representative who has indicated his/her understanding and acceptance.   Dental advisory given  Plan Discussed with:   Anesthesia Plan Comments:        Anesthesia Quick Evaluation

## 2013-04-12 NOTE — H&P (Signed)
Natalie Mccarthy is an 57 y.o. female.   Chief Complaint: Nausea post procedure HPI: Patient with metastatic colon cancer to the liver was worsening obstructive jaundice who underwent ERCP today to see if any stent double stricture was seen unfortunately we did not find 1 but the procedure went smoothly however she has had persistent nausea and her underlying preprocedure pain is unchanged but we have elected to observe her overnight since she is still not ready to go home. She has no other new complaints  Past Medical History  Diagnosis Date  . History of migraine headaches   . History of mononucleosis   . History of recurrent miscarriages, not currently pregnant   . Microcytic anemia   . Colon cancer 12/2010    metastatic  adenocarcinoma w/liver masses  . PONV (postoperative nausea and vomiting)   . Chemotherapy adverse reaction 04-07-13    last tx. 1'14- did caused platelet to drop 120's, now climbing up    Past Surgical History  Procedure Laterality Date  . Shoulder surgery  2009    Left shoulder  . Tonsillectomy      as child  . Portacath placement  12/20/2010    remains on right chest    History reviewed. No pertinent family history. Social History:  does not have a smoking history on file. She has never used smokeless tobacco. She reports that she does not drink alcohol or use illicit drugs.  Allergies:  Allergies  Allergen Reactions  . Compazine Other (See Comments)    "unknown"--family H/O allergy per patient    Medications Prior to Admission  Medication Sig Dispense Refill  . ALPRAZolam (XANAX) 0.5 MG tablet Take 0.5 mg by mouth as needed.      . hydrOXYzine (ATARAX/VISTARIL) 25 MG tablet Take 1 tablet (25 mg total) by mouth every 6 (six) hours as needed for itching.  30 tablet  0  . lidocaine-prilocaine (EMLA) cream Apply topically as needed.  30 g  1  . pantoprazole (PROTONIX) 40 MG tablet Take 40 mg by mouth daily as needed.      . SUMAtriptan (IMITREX) 100 MG  tablet Take 100 mg by mouth as needed. Migranes       . traMADol (ULTRAM) 50 MG tablet Take 1 tablet (50 mg total) by mouth every 6 (six) hours as needed.  30 tablet  2  . ibuprofen (ADVIL,MOTRIN) 800 MG tablet Take 800 mg by mouth every 8 (eight) hours as needed. For headaches       . LORazepam (ATIVAN) 0.5 MG tablet Take 1 tablet (0.5 mg total) by mouth every 8 (eight) hours as needed. 0.5 to 1 mg  30 tablet  3  . ondansetron (ZOFRAN) 8 MG tablet Take 1 tablet (8 mg total) by mouth every 12 (twelve) hours as needed for nausea.  20 tablet  1  . oxyCODONE-acetaminophen (PERCOCET) 5-325 MG per tablet Take 1 tablet by mouth every 4 (four) hours as needed.          Results for orders placed during the hospital encounter of 04/12/13 (from the past 48 hour(s))  ABO/RH     Status: None   Collection Time    04/12/13  7:40 AM      Result Value Range   ABO/RH(D) O NEG    TYPE AND SCREEN     Status: None   Collection Time    04/12/13  7:45 AM      Result Value Range   ABO/RH(D) O NEG  Antibody Screen NEG     Sample Expiration 04/15/2013     Dg Ercp With Sphincterotomy  04/12/2013   *RADIOLOGY REPORT*  Clinical Data: History of metastatic colon cancer, now with obstructive jaundice  ERCP  Comparison: CT abdomen pelvis - 03/31/2013; 12/01/2012  Findings:  10 spot intraoperative radiographic images during ERCP are provided for review.  Images demonstrate an ERCP probe overlying the right upper abdominal quadrant.  There is selective cannulation of the common bile duct.  There is marked beaded irregular narrowing of the central aspect of the right intrahepatic biliary tree.  Several saccular dilated ducts are seen within the peripheral aspect of the anterior segment of the right lobe of the liver.  There is no significant right- sided intrahepatic biliary ductal dilatation.  There is irregular malignant narrowing/occlusion involving the central aspect of the left intrahepatic biliary tree with associated  mild to moderate intrahepatic ductal dilatation within the left biliary system.  A balloon is seen insufflated within the central aspect of the proper hepatic duct.  The pancreatic duct is not definitely opacified.  There is opacification of a normal appearing gallbladder.  The cystic duct appears patent.  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   Original Report Authenticated By: Tacey Ruiz, MD    ROS negative except above  Blood pressure 135/76, pulse 68, temperature 97.8 F (36.6 C), temperature source Oral, resp. rate 15, height 5\' 7"  (1.702 m), weight 69.854 kg (154 lb), SpO2 100.00%. Physical Exam no acute distress vital signs stable lungs are clear heart regular rate and rhythm abdomen is soft good bowel sounds. Minimal upper discomfort no guarding or rebound  Assessment/Plan Metastatic colon cancer to the liver with worsening obstructive jaundice Plan: Will observe for nausea and hopefully she'll be able to go home tomorrow and will notify oncology team as well and if a prolonged hospitalization is imminent we might need the hospitalist team to get involved  [facility] E 04/12/2013, 3:36 PM

## 2013-04-12 NOTE — Progress Notes (Signed)
Dr Ewing Schlein in to see patient ,after examination and talking with patient & husband decision made to revaluate after his next procedure. After  MD evaluation  Patient vomiting . Vomitus clear with scant amount of bright blood,amount  50  To 75cc.        1530 After talking with patient and husband Dr Ewing Schlein made decision to admit  Patient  For observation.

## 2013-04-12 NOTE — Op Note (Addendum)
[facility] [address] [place], [address]   ERCP PROCEDURE REPORT  PATIENT: Natalie Mccarthy, Natalie Mccarthy.  MR# :[id number] BIRTHDATE: 1956/01/10  GENDER: Female ENDOSCOPIST: Vida Rigger, MD REFERRED BY: Mardelle Matte, M.D. PROCEDURE DATE:  04/12/2013 PROCEDURE:   ERCP with sphincterotomy/papillotomy ASA CLASS:    2 INDICATIONS: obstructive jaundice MEDICATIONS:   general anesthesia TOPICAL ANESTHETIC:  no  DESCRIPTION OF PROCEDURE:   After the risks benefits and alternatives of the procedure were thoroughly explained, informed consent was obtained.  The Pentax ERCP [id number]  endoscope was introduced through the mouth and advanced to the second portion of the duodenum .a normal ampulla was brought into view and using the triple-lumen sphincterotome loaded with the JAG Jaguar deep selective cannulation was fairly easily obtained and there were no pancreatic duct wire advancement or any pancreatic duct injection and the CBD was normal and the cystic duct was patent and the gallbladder was filled and the intrahepatics were abnormal the we could not find an obvious distensible stricture and we went ahead and made a small sphincterotomy in the customary fashion to make insertion of our stent for future cannulation easier and using the triple-lumen sphincterotome and curving the head we were able to cannulate the left side selectively which based on the CAT scan was the side that seemingly needed decompression more and we exchanged the occlusion balloon and proceeded with multiple occlusion cholangiograms and we tried to advance the wire multiple times to the periphery but could not advance the wire into a possible stentable location and after our prolonged effort with multiple injections and fluoroscopy use we elected to stop the procedure without placing a stent and the scope was removed after the wire and the occlusion balloon was removed and the patient tolerated  the procedure well      COMPLICATIONS:   none  ENDOSCOPIC IMPRESSION:1. Normal ampulla 2. No pancreatic duct injections over wire advancedments 3.abnormal intrahepatics but unable to find an obvious stentable lesion  RECOMMENDATIONS:observe for delayed complications trial of Colestid and if not helpful would use Urso next and a new chemotherapy trial per oncology    _______________________________ eSigned:  Vida Rigger, MD 04/12/2013 10:27 AM   XB:[id number] Truett Perna, MD

## 2013-04-13 ENCOUNTER — Telehealth: Payer: Self-pay | Admitting: *Deleted

## 2013-04-13 ENCOUNTER — Encounter (HOSPITAL_COMMUNITY): Payer: Self-pay | Admitting: Gastroenterology

## 2013-04-13 LAB — COMPREHENSIVE METABOLIC PANEL
ALT: 220 U/L — ABNORMAL HIGH (ref 0–35)
AST: 214 U/L — ABNORMAL HIGH (ref 0–37)
Albumin: 2.7 g/dL — ABNORMAL LOW (ref 3.5–5.2)
Alkaline Phosphatase: 500 U/L — ABNORMAL HIGH (ref 39–117)
CO2: 25 mEq/L (ref 19–32)
Chloride: 108 mEq/L (ref 96–112)
Creatinine, Ser: 0.62 mg/dL (ref 0.50–1.10)
GFR calc non Af Amer: >90 mL/min (ref >90)
Potassium: 3.5 mEq/L (ref 3.5–5.1)
Total Bilirubin: 3.9 mg/dL — ABNORMAL HIGH (ref 0.3–1.2)

## 2013-04-13 LAB — CBC WITH DIFFERENTIAL/PLATELET
Lymphs Abs: 0.7 10*3/uL (ref 0.7–4.0)
MCH: 28.3 pg (ref 26.0–34.0)
MCHC: 33.5 g/dL (ref 30.0–36.0)
MCV: 84.3 fL (ref 78.0–100.0)
Monocytes Absolute: 0.7 10*3/uL (ref 0.1–1.0)
Monocytes Relative: 10 % (ref 3–12)
Neutrophils Relative %: 79 % — ABNORMAL HIGH (ref 43–77)
Platelets: 141 10*3/uL — ABNORMAL LOW (ref 150–400)
RDW: 15.5 % (ref 11.5–15.5)

## 2013-04-13 MED ORDER — HEPARIN SOD (PORK) LOCK FLUSH 100 UNIT/ML IV SOLN: 500.0000 [IU] | INTRAVENOUS | Status: DC | PRN

## 2013-04-13 MED ORDER — HEPARIN SOD (PORK) LOCK FLUSH 100 UNIT/ML IV SOLN
INTRAVENOUS | Status: AC
  Administered 2013-04-13: 500 [IU]
  Filled 2013-04-13: qty 5

## 2013-04-13 NOTE — Telephone Encounter (Signed)
Spoke with pt, Dr. Truett Perna plans to move chemo to 6/13 with hopes that chemo will shrink the areas in her liver. She states she would rather begin chemo after she returns from her family vacation if at all possible. Pt is afraid chemo will make her feel bad and right now she is only itching. Wants to be clear she will change plans if MD thinks this will benefit her in the long run. Informed her she will likely become more jaundiced and have pain. Reviewed with Dr. Truett Perna, OK. Call office if any problems arise. Pt voiced understanding. She will be back in town 6/23.

## 2013-04-13 NOTE — Progress Notes (Signed)
Pt D/C home alert and oriented , no new complains, D/C instructions done, pt verbalizes understanding

## 2013-04-13 NOTE — Care Management Note (Signed)
Cm spoke with patient prior to discharge. Pt dressed, ready for discharge home with spouse present to provide tx. Per pt no HH services or DME required. No other needs assessed.   Roxy Manns Belma Dyches,RN,BSN [phone]

## 2013-04-13 NOTE — Care Management Note (Signed)
CARE MANAGEMENT NOTE 04/13/2013  Patient:  Natalie Mccarthy, Natalie Mccarthy   Account Number:  [id number]  Date Initiated:  04/13/2013  Documentation initiated by:  Haydn Cush  Subjective/Objective Assessment:   57 yo female admitted s/p ERCP. PTA pt from home with spouse.     Action/Plan:   Home when stable   Anticipated DC Date:  04/13/2013   Anticipated DC Plan:  HOME/SELF CARE      DC Planning Services  CM consult      Choice offered to / List presented to:  NA   DME arranged  NA      DME agency  NA     HH arranged  NA      HH agency  NA   Status of service:  Completed, signed off Medicare Important Message given?   (If response is "NO", the following Medicare IM given date fields will be blank) Date Medicare IM given:   Date Additional Medicare IM given:    Discharge Disposition:    Per UR Regulation:  Reviewed for med. necessity/level of care/duration of stay  If discussed at Long Length of Stay Meetings, dates discussed:    Comments:  04/13/13 1213 Anthony Tamburo,RN,BSN [phone]

## 2013-04-13 NOTE — Discharge Summary (Signed)
  The patient came in as an outpatient and underwent ERCP and a limited sphincterotomy by Dr. Vida Mccarthy, during which no specific stentable lesion was identified.  The intent was for the patient to return home following the procedure, but she had a fair amount of nausea that did not resolve in the recovery area, so she was kept on overnight observation.   Fortunately, within hours after the procedure, the nausea had resolved and she tolerated a clear liquid diet and small amounts of solid food.  This morning, her labs show a normal white count, and slight improvement in her bilirubin compared to her value yesterday.  The patient is sitting up in bed, looking very chipper, no distress whatsoever, no complaint of nausea, asking for food. The abdomen has bowel sounds and is nontender. There is no evident jaundice.  Discharged home was therefore felt to be appropriate, as per prior plan. There is no change in her medical regimen apart from the fact that Dr. Ewing Mccarthy had already phoned in a prescription for Colestid to see if this would work for her itching, in place of hydroxyzine which is causing her sedation.  Followup will be as planned with her oncologist.  Condition on discharge (observation status): Improved  Natalie Mccarthy, M.D. [phone]

## 2013-04-14 ENCOUNTER — Other Ambulatory Visit: Payer: Self-pay | Admitting: *Deleted

## 2013-04-14 ENCOUNTER — Other Ambulatory Visit: Payer: Self-pay | Admitting: Oncology

## 2013-04-14 DIAGNOSIS — C189 Malignant neoplasm of colon, unspecified: Secondary | ICD-10-CM

## 2013-04-14 NOTE — Progress Notes (Signed)
Patient left message that she will be coming in for her chemo treatment on 04/15/13. Dr. Truett Perna notified.

## 2013-04-15 ENCOUNTER — Ambulatory Visit (HOSPITAL_BASED_OUTPATIENT_CLINIC_OR_DEPARTMENT_OTHER): Payer: BC Managed Care – PPO

## 2013-04-15 ENCOUNTER — Other Ambulatory Visit (HOSPITAL_BASED_OUTPATIENT_CLINIC_OR_DEPARTMENT_OTHER): Payer: BC Managed Care – PPO | Admitting: Lab

## 2013-04-15 VITALS — BP 127/83 | HR 83 | Temp 98.7°F | Resp 18

## 2013-04-15 DIAGNOSIS — C189 Malignant neoplasm of colon, unspecified: Secondary | ICD-10-CM

## 2013-04-15 DIAGNOSIS — Z5111 Encounter for antineoplastic chemotherapy: Secondary | ICD-10-CM

## 2013-04-15 DIAGNOSIS — C787 Secondary malignant neoplasm of liver and intrahepatic bile duct: Secondary | ICD-10-CM

## 2013-04-15 DIAGNOSIS — C187 Malignant neoplasm of sigmoid colon: Secondary | ICD-10-CM

## 2013-04-15 LAB — CBC WITH DIFFERENTIAL/PLATELET
BASO%: 0.9 % (ref 0.0–2.0)
EOS%: 3.6 % (ref 0.0–7.0)
HCT: 43 % (ref 34.8–46.6)
MCH: 27.9 pg (ref 25.1–34.0)
MCHC: 33.5 g/dL (ref 31.5–36.0)
MONO#: 0.7 10*3/uL (ref 0.1–0.9)
NEUT%: 74.8 % (ref 38.4–76.8)
RDW: 16.3 % — ABNORMAL HIGH (ref 11.2–14.5)
WBC: 6.2 10*3/uL (ref 3.9–10.3)
lymph#: 0.6 10*3/uL — ABNORMAL LOW (ref 0.9–3.3)

## 2013-04-15 LAB — COMPREHENSIVE METABOLIC PANEL (CC13)
ALT: 335 U/L — ABNORMAL HIGH (ref 0–55)
AST: 235 U/L — ABNORMAL HIGH (ref 5–34)
Albumin: 3.4 g/dL — ABNORMAL LOW (ref 3.5–5.0)
CO2: 26 mEq/L (ref 22–29)
Calcium: 10.1 mg/dL (ref 8.4–10.4)
Chloride: 101 mEq/L (ref 98–107)
Potassium: 4.5 mEq/L (ref 3.5–5.1)
Sodium: 138 mEq/L (ref 136–145)
Total Protein: 7.9 g/dL (ref 6.4–8.3)

## 2013-04-15 MED ORDER — HEPARIN SOD (PORK) LOCK FLUSH 100 UNIT/ML IV SOLN
500.0000 [IU] | Freq: Once | INTRAVENOUS | Status: AC | PRN
  Administered 2013-04-15: 500 [IU]
  Filled 2013-04-15: qty 5

## 2013-04-15 MED ORDER — DEXTROSE 5 % IV SOLN
Freq: Once | INTRAVENOUS | Status: AC
  Administered 2013-04-15: 13:00:00 via INTRAVENOUS

## 2013-04-15 MED ORDER — ONDANSETRON 8 MG/50ML IVPB (CHCC)
8.0000 mg | Freq: Once | INTRAVENOUS | Status: AC
  Administered 2013-04-15: 8 mg via INTRAVENOUS

## 2013-04-15 MED ORDER — SODIUM CHLORIDE 0.9 % IJ SOLN
10.0000 mL | INTRAMUSCULAR | Status: DC | PRN
  Administered 2013-04-15: 10 mL
  Filled 2013-04-15: qty 10

## 2013-04-15 MED ORDER — OXALIPLATIN CHEMO INJECTION 100 MG/20ML
100.0000 mg/m2 | Freq: Once | INTRAVENOUS | Status: AC
  Administered 2013-04-15: 185 mg via INTRAVENOUS
  Filled 2013-04-15: qty 37

## 2013-04-15 MED ORDER — DEXAMETHASONE SODIUM PHOSPHATE 10 MG/ML IJ SOLN
10.0000 mg | Freq: Once | INTRAMUSCULAR | Status: AC
  Administered 2013-04-15: 10 mg via INTRAVENOUS

## 2013-04-15 NOTE — Progress Notes (Signed)
1640 At time of treatment completion and getting ready to d/c patient,  patient reports to have slight numbness to tip of tongue that started 30 minutes and now her lips feel slightly numb, asking if this is normal. VSS. Reviewed with Lonna Cobb, NP, patient to stay for 15 minutes for monitoring. Patient informed.

## 2013-04-15 NOTE — Patient Instructions (Addendum)
[facility] Discharge Instructions for Patients Receiving Chemotherapy  Today you received the following chemotherapy agents Oxaliplatin To help prevent nausea and vomiting after your treatment, we encourage you to take your nausea medication as prescribed.  If you develop nausea and vomiting that is not controlled by your nausea medication, call the clinic.   BELOW ARE SYMPTOMS THAT SHOULD BE REPORTED IMMEDIATELY:  *FEVER GREATER THAN 100.5 F  *CHILLS WITH OR WITHOUT FEVER  NAUSEA AND VOMITING THAT IS NOT CONTROLLED WITH YOUR NAUSEA MEDICATION  *UNUSUAL SHORTNESS OF BREATH  *UNUSUAL BRUISING OR BLEEDING  TENDERNESS IN MOUTH AND THROAT WITH OR WITHOUT PRESENCE OF ULCERS  *URINARY PROBLEMS  *BOWEL PROBLEMS  UNUSUAL RASH Items with * indicate a potential emergency and should be followed up as soon as possible.  Feel free to call the clinic you have any questions or concerns. The clinic phone number is [phone].     Oxaliplatin Injection What is this medicine? OXALIPLATIN (ox AL i PLA tin) is a chemotherapy drug. It targets fast dividing cells, like cancer cells, and causes these cells to die. This medicine is used to treat cancers of the colon and rectum, and many other cancers. This medicine may be used for other purposes; ask your health care provider or pharmacist if you have questions. What should I tell my health care provider before I take this medicine? They need to know if you have any of these conditions: -kidney disease -an unusual or allergic reaction to oxaliplatin, other chemotherapy, other medicines, foods, dyes, or preservatives -pregnant or trying to get pregnant -breast-feeding How should I use this medicine? This drug is given as an infusion into a vein. It is administered in a hospital or clinic by a specially trained health care professional. Talk to your pediatrician regarding the use of this medicine in children. Special care may be  needed. Overdosage: If you think you have taken too much of this medicine contact a poison control center or emergency room at once. NOTE: This medicine is only for you. Do not share this medicine with others. What if I miss a dose? It is important not to miss a dose. Call your doctor or health care professional if you are unable to keep an appointment. What may interact with this medicine? -medicines to increase blood counts like filgrastim, pegfilgrastim, sargramostim -probenecid -some antibiotics like amikacin, gentamicin, neomycin, polymyxin B, streptomycin, tobramycin -zalcitabine Talk to your doctor or health care professional before taking any of these medicines: -acetaminophen -aspirin -ibuprofen -ketoprofen -naproxen This list may not describe all possible interactions. Give your health care provider a list of all the medicines, herbs, non-prescription drugs, or dietary supplements you use. Also tell them if you smoke, drink alcohol, or use illegal drugs. Some items may interact with your medicine. What should I watch for while using this medicine? Your condition will be monitored carefully while you are receiving this medicine. You will need important blood work done while you are taking this medicine. This medicine can make you more sensitive to cold. Do not drink cold drinks or use ice. Cover exposed skin before coming in contact with cold temperatures or cold objects. When out in cold weather wear warm clothing and cover your mouth and nose to warm the air that goes into your lungs. Tell your doctor if you get sensitive to the cold. This drug may make you feel generally unwell. This is not uncommon, as chemotherapy can affect healthy cells as well as cancer cells.  Report any side effects. Continue your course of treatment even though you feel ill unless your doctor tells you to stop. In some cases, you may be given additional medicines to help with side effects. Follow all  directions for their use. Call your doctor or health care professional for advice if you get a fever, chills or sore throat, or other symptoms of a cold or flu. Do not treat yourself. This drug decreases your body's ability to fight infections. Try to avoid being around people who are sick. This medicine may increase your risk to bruise or bleed. Call your doctor or health care professional if you notice any unusual bleeding. Be careful brushing and flossing your teeth or using a toothpick because you may get an infection or bleed more easily. If you have any dental work done, tell your dentist you are receiving this medicine. Avoid taking products that contain aspirin, acetaminophen, ibuprofen, naproxen, or ketoprofen unless instructed by your doctor. These medicines may hide a fever. Do not become pregnant while taking this medicine. Women should inform their doctor if they wish to become pregnant or think they might be pregnant. There is a potential for serious side effects to an unborn child. Talk to your health care professional or pharmacist for more information. Do not breast-feed an infant while taking this medicine. Call your doctor or health care professional if you get diarrhea. Do not treat yourself. What side effects may I notice from receiving this medicine? Side effects that you should report to your doctor or health care professional as soon as possible: -allergic reactions like skin rash, itching or hives, swelling of the face, lips, or tongue -low blood counts - This drug may decrease the number of white blood cells, red blood cells and platelets. You may be at increased risk for infections and bleeding. -signs of infection - fever or chills, cough, sore throat, pain or difficulty passing urine -signs of decreased platelets or bleeding - bruising, pinpoint red spots on the skin, black, tarry stools, nosebleeds -signs of decreased red blood cells - unusually weak or tired, fainting  spells, lightheadedness -breathing problems -chest pain, pressure -cough -diarrhea -jaw tightness -mouth sores -nausea and vomiting -pain, swelling, redness or irritation at the injection site -pain, tingling, numbness in the hands or feet -problems with balance, talking, walking -redness, blistering, peeling or loosening of the skin, including inside the mouth -trouble passing urine or change in the amount of urine Side effects that usually do not require medical attention (report to your doctor or health care professional if they continue or are bothersome): -changes in vision -constipation -hair loss -loss of appetite -metallic taste in the mouth or changes in taste -stomach pain This list may not describe all possible side effects. Call your doctor for medical advice about side effects. You may report side effects to FDA at 1-800-FDA-1088. Where should I keep my medicine? This drug is given in a hospital or clinic and will not be stored at home. NOTE: This sheet is a summary. It may not cover all possible information. If you have questions about this medicine, talk to your doctor, pharmacist, or health care provider.  2013, [employer]. (05/16/2008 5:22:47 PM)

## 2013-04-15 NOTE — Progress Notes (Signed)
Vitals 127/83 P-84 Temp 98.7 Pulse Ox 96%.  Pt states "tingling in fingers and palms still the same and my mouth & tongue seems to be getting better"  Report given to Dr. Darrold Span and per MD pt discharged with instruction to call if problems and oxaliplatin precautions reviewed regarding cold sensitivity.  Pt and husband verbalized understanding of instructions.

## 2013-04-18 ENCOUNTER — Telehealth: Payer: Self-pay | Admitting: *Deleted

## 2013-04-18 NOTE — Telephone Encounter (Signed)
Patient is experiencing nausea.  See today's note from collaborative nurse.  Reports she feels like she could throw up but hasn't since last night at dinner time.  Threw up her entire meal.  Today has no appetite but has eaten a little to stave off hunger.  Her son is to graduate tonight and she needs to leave at 5:30 to get him there.  Also family plans to go to [employer].  Hopes she will feel better.   Uses [facility] on [address].

## 2013-04-18 NOTE — Telephone Encounter (Signed)
Per Dr. Truett Perna, called and spoke with pt instructing to alternate Ativan and Zofran to control nausea and if any problems at the beach to call office.  Pt verbalized understanding.

## 2013-04-18 NOTE — Telephone Encounter (Signed)
Pt called stating "I started Xeloda Sat; have been nauseous, loss of appetite, vomited X1.  Have been taking my Zofran every 12 hours.  Today it's somewhat better but feel like Zofran not strong enough"  Pt states she is going to the beach tomorrow; reported she did eat breakfast this am.  Also reports loss of 2 1/2 lbs.  Note to MD.

## 2013-04-18 NOTE — Telephone Encounter (Signed)
Message copied by Augusto Garbe on Mon Apr 18, 2013  3:42 PM ------      Message from: [place], [place] E      Created: Sat Apr 16, 2013 12:54 PM      Regarding: chemo follow up       Call patient for first Oxali follow up, mild oral numbness at end of transfusion.      Not new to chemo though ------

## 2013-04-19 ENCOUNTER — Other Ambulatory Visit: Payer: Self-pay | Admitting: *Deleted

## 2013-04-19 DIAGNOSIS — C189 Malignant neoplasm of colon, unspecified: Secondary | ICD-10-CM

## 2013-04-19 MED ORDER — PANTOPRAZOLE SODIUM 40 MG PO TBEC: 40.0000 mg | DELAYED_RELEASE_TABLET | Freq: Every day | ORAL | Status: DC

## 2013-04-25 ENCOUNTER — Other Ambulatory Visit: Payer: Self-pay | Admitting: *Deleted

## 2013-04-25 NOTE — Progress Notes (Signed)
Last chemo 6/13-next due on 05/06/13. Patient prefers 05/09/13. POF to scheduler to move L/OV/tx to 05/09/13. Will see NP per Dr. Truett Perna.

## 2013-04-26 ENCOUNTER — Other Ambulatory Visit: Payer: BC Managed Care – PPO | Admitting: Lab

## 2013-04-26 ENCOUNTER — Telehealth: Payer: Self-pay | Admitting: Oncology

## 2013-04-26 ENCOUNTER — Ambulatory Visit: Payer: BC Managed Care – PPO

## 2013-04-26 ENCOUNTER — Ambulatory Visit: Payer: BC Managed Care – PPO | Admitting: Oncology

## 2013-04-26 ENCOUNTER — Telehealth: Payer: Self-pay | Admitting: *Deleted

## 2013-04-26 NOTE — Telephone Encounter (Signed)
Per staff phone call and POF I have schedueld appts.  JMW  

## 2013-04-26 NOTE — Telephone Encounter (Signed)
Gave pt appt for lab ML and chemo for 05/09/13

## 2013-04-26 NOTE — Telephone Encounter (Signed)
Today is day #11 of her Xeloda cycle. Has developed significant tenderness and redness in palms of her hands and balls of her feet. Painful to walk. No blisters. Asking what to do? Instructed her to stop the Xeloda (per MD). Will probably require dose reduction with next cycle.

## 2013-04-29 ENCOUNTER — Other Ambulatory Visit: Payer: Self-pay | Admitting: *Deleted

## 2013-04-29 NOTE — Telephone Encounter (Signed)
Refill request to MD desk for approval. May need dose reduction due to needing to hold med on day 11 for pain in hands/feet.

## 2013-05-02 ENCOUNTER — Other Ambulatory Visit: Payer: Self-pay | Admitting: *Deleted

## 2013-05-02 MED ORDER — MAGIC MOUTHWASH: 5.0000 mL | Freq: Four times a day (QID) | ORAL | Status: DC | PRN

## 2013-05-02 NOTE — Telephone Encounter (Signed)
THIS REFILL REQUEST FOR CAPECITABINE WAS GIVEN TO DR.SHERRILL'S NURSE, TANYA WHITLOCK,RN. 

## 2013-05-04 MED ORDER — CAPECITABINE 500 MG PO TABS: 1000.0000 mg | ORAL_TABLET | Freq: Two times a day (BID) | ORAL | Status: DC

## 2013-05-04 NOTE — Addendum Note (Signed)
Addended by: Wandalee Ferdinand on: 05/04/2013 12:40 PM   Modules accepted: Orders

## 2013-05-05 ENCOUNTER — Encounter: Payer: Self-pay | Admitting: Specialist

## 2013-05-08 ENCOUNTER — Other Ambulatory Visit: Payer: Self-pay | Admitting: Oncology

## 2013-05-09 ENCOUNTER — Telehealth: Payer: Self-pay | Admitting: *Deleted

## 2013-05-09 ENCOUNTER — Ambulatory Visit (HOSPITAL_BASED_OUTPATIENT_CLINIC_OR_DEPARTMENT_OTHER): Payer: BC Managed Care – PPO | Admitting: Nurse Practitioner

## 2013-05-09 ENCOUNTER — Ambulatory Visit: Payer: BC Managed Care – PPO

## 2013-05-09 ENCOUNTER — Telehealth: Payer: Self-pay | Admitting: Oncology

## 2013-05-09 ENCOUNTER — Other Ambulatory Visit (HOSPITAL_BASED_OUTPATIENT_CLINIC_OR_DEPARTMENT_OTHER): Payer: BC Managed Care – PPO | Admitting: Lab

## 2013-05-09 VITALS — BP 107/70 | HR 65 | Temp 97.2°F | Resp 18 | Ht 67.0 in | Wt 150.4 lb

## 2013-05-09 DIAGNOSIS — C189 Malignant neoplasm of colon, unspecified: Secondary | ICD-10-CM

## 2013-05-09 LAB — COMPREHENSIVE METABOLIC PANEL (CC13)
ALT: 69 U/L — ABNORMAL HIGH (ref 0–55)
Albumin: 3.3 g/dL — ABNORMAL LOW (ref 3.5–5.0)
CO2: 28 mEq/L (ref 22–29)
Calcium: 9.8 mg/dL (ref 8.4–10.4)
Chloride: 108 mEq/L (ref 98–109)
Glucose: 93 mg/dl (ref 70–140)
Sodium: 142 mEq/L (ref 136–145)
Total Protein: 7.2 g/dL (ref 6.4–8.3)

## 2013-05-09 LAB — CEA: CEA: 64.8 ng/mL — ABNORMAL HIGH (ref 0.0–5.0)

## 2013-05-09 LAB — CBC WITH DIFFERENTIAL/PLATELET
Basophils Absolute: 0.1 10*3/uL (ref 0.0–0.1)
Eosinophils Absolute: 0.2 10*3/uL (ref 0.0–0.5)
HGB: 13 g/dL (ref 11.6–15.9)
MONO#: 0.5 10*3/uL (ref 0.1–0.9)
NEUT#: 2.3 10*3/uL (ref 1.5–6.5)
Platelets: 66 10*3/uL — ABNORMAL LOW (ref 145–400)
RBC: 4.59 10*6/uL (ref 3.70–5.45)
RDW: 18.1 % — ABNORMAL HIGH (ref 11.2–14.5)
WBC: 3.6 10*3/uL — ABNORMAL LOW (ref 3.9–10.3)

## 2013-05-09 NOTE — Telephone Encounter (Signed)
Per staff message and POF I have scheduled appts.  JMW  

## 2013-05-09 NOTE — Telephone Encounter (Signed)
gv and printed appt sched and avs forlpt...emailed MW to add tx....  °

## 2013-05-09 NOTE — Progress Notes (Addendum)
OFFICE PROGRESS NOTE  Interval history:  Natalie Mccarthy returns as scheduled. She began cycle 1 CAPOX on 04/15/2013. She began the Xeloda portion of the treatment on 04/16/2013. She developed nausea/vomiting on 04/16/2013. The nausea persisted for about 1 week. She took Zofran and Ativan as needed. On 04/21/2013 she had multiple loose stools. She took Imodium. The loose stools resolved. She had a single loose stool a few days later. Bowels now moving regularly. Around 04/23/2013 her feet became "red and painful". She had difficulty walking. She discontinued the Xeloda on 04/25/2013. She also developed mouth sores. The pruritus and jaundice have resolved.   Objective: Blood pressure 107/70, pulse 65, temperature 97.2 F (36.2 C), temperature source Oral, resp. rate 18, height 5\' 7"  (1.702 m), weight 150 lb 6.4 oz (68.221 kg).  Oropharynx is without thrush or ulceration. Mucous membranes appear moist. Lungs are clear. Regular cardiac rhythm. Port-A-Cath site without erythema. Abdomen soft and nontender. No organomegaly. Extremities are without edema. She does not appear jaundiced. Palms and soles are without erythema. The soles appear mildly dry. No skin breakdown.  Lab Results: Lab Results  Component Value Date   WBC 3.6* 05/09/2013   HGB 13.0 05/09/2013   HCT 38.8 05/09/2013   MCV 84.7 05/09/2013   PLT 66* 05/09/2013    Chemistry:    Chemistry      Component Value Date/Time   NA 142 05/09/2013 1127   NA 140 04/13/2013 0600   NA 143 01/27/2012 1354   K 4.4 05/09/2013 1127   K 3.5 04/13/2013 0600   K 3.8 01/27/2012 1354   CL 101 04/15/2013 1230   CL 108 04/13/2013 0600   CL 97* 01/27/2012 1354   CO2 28 05/09/2013 1127   CO2 25 04/13/2013 0600   CO2 29 01/27/2012 1354   BUN 13.4 05/09/2013 1127   BUN 9 04/13/2013 0600   BUN 11 01/27/2012 1354   CREATININE 0.8 05/09/2013 1127   CREATININE 0.62 04/13/2013 0600   CREATININE 0.9 01/27/2012 1354      Component Value Date/Time   CALCIUM 9.8 05/09/2013 1127   CALCIUM  7.9* 04/13/2013 0600   CALCIUM 8.9 01/27/2012 1354   ALKPHOS 243* 05/09/2013 1127   ALKPHOS 500* 04/13/2013 0600   ALKPHOS 198* 01/27/2012 1354   AST 72* 05/09/2013 1127   AST 214* 04/13/2013 0600   AST 34 01/27/2012 1354   ALT 69* 05/09/2013 1127   ALT 220* 04/13/2013 0600   BILITOT 1.17 05/09/2013 1127   BILITOT 3.9* 04/13/2013 0600   BILITOT 0.50 01/27/2012 1354       Studies/Results: Dg Ercp With Sphincterotomy  04/12/2013   *RADIOLOGY REPORT*  Clinical Data: History of metastatic colon cancer, now with obstructive jaundice  ERCP  Comparison: CT abdomen pelvis - 03/31/2013; 12/01/2012  Findings:  10 spot intraoperative radiographic images during ERCP are provided for review.  Images demonstrate an ERCP probe overlying the right upper abdominal quadrant.  There is selective cannulation of the common bile duct.  There is marked beaded irregular narrowing of the central aspect of the right intrahepatic biliary tree.  Several saccular dilated ducts are seen within the peripheral aspect of the anterior segment of the right lobe of the liver.  There is no significant right- sided intrahepatic biliary ductal dilatation.  There is irregular malignant narrowing/occlusion involving the central aspect of the left intrahepatic biliary tree with associated mild to moderate intrahepatic ductal dilatation within the left biliary system.  A balloon is seen insufflated within the central  aspect of the proper hepatic duct.  The pancreatic duct is not definitely opacified.  There is opacification of a normal appearing gallbladder.  The cystic duct appears patent.  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   Original Report Authenticated By: Tacey Ruiz, MD    Medications: I have reviewed the patient's current medications.  Assessment/Plan:  1.Metastatic colon cancer with multiple  liver metastases noted on an MRI of the abdomen 12/02/2010 and on a CT 12/13/2010. A transverse colon mass was noted on the CT from 02/11/2011 and confirmed on a colonoscopy 12/17/2010. She began treatment with FOLFIRI/Avastin 12/31/2010. A restaging CT 06/20/2011 confirmed further improvement in the metastatic liver lesions and no evidence for progressive metastatic disease. A restaging CT 08/01/2011 showed stable liver lesions and no new lesions. Restaging CT 09/12/2011 showed stable liver lesions and no new lesions. A restaging CT 10/27/2011 showed stable liver lesions and no new lesions. Restaging CT 12/05/2011 showed mild decrease in size of hepatic metastases and no evidence of disease progression. Restaging CT 01/26/2012 showed stable hepatic metastases and no new lesions. Restaging CT may 5/8/ 2013 revealed a decreased size of hepatic metastases and no new lesions. Restaging CT 04/23/2012 revealed no change in the measurable "target" lesions, a slight increase in the size of other lesions, and no new lesions. Restaging CT 06/04/2012 showed multiple hepatic metastases were grossly unchanged. Restaging CT 07/19/2012 with stable disease in the liver. Infusional 5-fluorouracil continued on a 2 week schedule. Treatment was held after the 08/03/2012 cycle due to thrombocytopenia. Restaging CT evaluation 08/30/2012 with mild increase in size of large hepatic metastases. Treatment was resumed with 5 fluorouracil and Avastin on 08/31/2012. Restaging CT 10/14/2012-stable hepatic metastases. Treatment continued with 5 fluorouracil and Avastin. Restaging CT 12/01/2012 revealed mild progression in the size of multiple hepatic lesions. Treatment was placed on hold. CEA 32.1 on 01/06/2013 and 71.9 on 03/11/2013. Restaging CT abdomen/pelvis 03/31/2013 with interval progression of liver metastases. She began cycle 1 CAPOX on 04/15/2013. The Xeloda was prematurely discontinued on 04/25/2013 due to redness and pain over the  feet. 2. Abdominal pain secondary to hepatomegaly/liver metastases. She did not complain of abdominal pain at today's visit. 3. Anorexia, early satiety, and weight loss secondary to metastatic colon cancer-resolved.  4. Microcytic anemia. Resolved.  5. History of migraine headaches.  6. Status post uterine fibroid surgery.  7. Port-A-Cath placement 12/20/2010.  8. Delayed nausea following cycle 1 of FOLFIRI/Avastin. Improved with the addition of Aloxi and prophylactic Decadron beginning with cycle #2. The prophylactic Decadron was tapered. We increased the day 1 Decadron back to 20 mg.  9. Neutropenia secondary to chemotherapy. Cycle #4 was held on 02/11/2011. She subsequently received Neulasta support.  10. History of intermittent fever with no apparent source for infection, likely "tumor fever" or fever related to chemotherapy. No recent fever.  11. History of mucositis secondary to 5-FU, initially grade 2. Irinotecan and 5-FU were dose reduced per study guidelines.  12. History of abdominal cramping. ? Related to irinotecan, 5-FU, or potentially the colon tumor. No recent abdominal cramping.  13. Thrombocytopenia secondary to chemotherapy (while being treated with irinotecan and Avastin). Treatment was held for thrombocytopenia per study guidelines. The thrombocytopenia may be related to Avastin, or chronic liver disease from chemotherapy. The CT on 04/23/2012 confirmed mild splenomegaly. The CT on 06/04/2012 again showed splenomegaly. The thrombocytopenia persisted despite being off of irinotecan  and Avastin for several months. The thrombocytopenia improved. She developed thrombocytopenia following cycle 1 CAPOX. 14. Acne-like rash on the face. Question related to steroids. Resolved.  15. Arthralgias-most likely unrelated to the colon cancer diagnosis and chemotherapy. She has been evaluated by orthopedics. The etiology of the arthralgias is unclear.  16. Persistent discomfort at the right  shoulder. Negative CT of the right upper extremity on 07/19/2012. MRI on 08/06/2012 with a partial thickness bursal surface tear of the supraspinatus tendon, moderate rotator cuff tendinopathy/tendinosis. She is now followed by orthopedics. The pain is stable. The range of motion at the right shoulder has improved  17.? Swelling at the right upper extremity and chest wall April 2014-negative right upper extremity Doppler. 18. Delayed nausea following cycle 1 CAPOX. We will adjust the premedication regimen to include Emend and Aloxi. 19. Mucositis and hand-foot syndrome secondary to Xeloda. The Xeloda will be dose reduced to 1000 mg twice daily for 14 days with cycle 2. 20. Thrombocytopenia following cycle 1 CAPOX.  Disposition-Natalie Mccarthy appears stable. She has completed one cycle of CAPOX. The Xeloda was prematurely discontinued due to hand-foot syndrome. She also developed mucositis. The bilirubin has normalized. She has progressive thrombocytopenia on labs today.   We will hold today's treatment and reschedule for one week. Adjustments will be made to the premedication regimen as noted above for the delayed nausea and Xeloda will be dose reduced for hand-foot syndrome and mucositis.  We will see her in followup prior to cycle 3 on 06/07/2013. She will contact the office in the interim with any problems.   Lonna Cobb ANP/GNP-BC   I saw Natalie Mccarthy and discussed the first cycle of CAPOX and treatment options with her. She developed hand/foot syndrome with a first cycle of Xeloda. She may have been an increased risk for toxicity in the setting of hyperbilirubinemia. The elevated bilirubin/alkaline phosphatase have significantly improved. This may indicate a response to systemic therapy.  We decided to to dose reduce the Xeloda with cycle 2.  She has thrombocytopenia. This is likely related to oxaliplatin. Chemotherapy will be held today secondary to thrombocytopenia. She will return for a  second cycle of CAPOX on 05/17/2013.  Mancel Bale, M.D.

## 2013-05-10 NOTE — Telephone Encounter (Signed)
RECEIVED A FAX FROM [facility] CONCERNING A CONFIRMATION OF PRESCRIPTION SHIPMENT FOR CAPECITABINE ON 05/09/13.

## 2013-05-16 ENCOUNTER — Ambulatory Visit: Payer: BC Managed Care – PPO | Admitting: Nurse Practitioner

## 2013-05-17 ENCOUNTER — Ambulatory Visit: Payer: BC Managed Care – PPO | Admitting: Oncology

## 2013-05-17 ENCOUNTER — Other Ambulatory Visit: Payer: BC Managed Care – PPO | Admitting: Lab

## 2013-05-17 ENCOUNTER — Ambulatory Visit: Payer: BC Managed Care – PPO

## 2013-05-17 ENCOUNTER — Other Ambulatory Visit (HOSPITAL_BASED_OUTPATIENT_CLINIC_OR_DEPARTMENT_OTHER): Payer: BC Managed Care – PPO | Admitting: Lab

## 2013-05-17 DIAGNOSIS — C189 Malignant neoplasm of colon, unspecified: Secondary | ICD-10-CM

## 2013-05-17 LAB — CBC WITH DIFFERENTIAL/PLATELET
BASO%: 0.8 % (ref 0.0–2.0)
EOS%: 6.6 % (ref 0.0–7.0)
MCH: 28.4 pg (ref 25.1–34.0)
MCHC: 33.6 g/dL (ref 31.5–36.0)
MCV: 84.4 fL (ref 79.5–101.0)
MONO%: 12 % (ref 0.0–14.0)
RBC: 4.71 10*6/uL (ref 3.70–5.45)
RDW: 17.9 % — ABNORMAL HIGH (ref 11.2–14.5)
lymph#: 0.5 10*3/uL — ABNORMAL LOW (ref 0.9–3.3)

## 2013-05-17 LAB — COMPREHENSIVE METABOLIC PANEL (CC13)
ALT: 39 U/L (ref 0–55)
AST: 52 U/L — ABNORMAL HIGH (ref 5–34)
Albumin: 3.5 g/dL (ref 3.5–5.0)
Alkaline Phosphatase: 232 U/L — ABNORMAL HIGH (ref 40–150)
BUN: 15.1 mg/dL (ref 7.0–26.0)
Calcium: 9.8 mg/dL (ref 8.4–10.4)
Chloride: 105 mEq/L (ref 98–109)
Potassium: 4.2 mEq/L (ref 3.5–5.1)

## 2013-05-17 NOTE — Progress Notes (Signed)
Treatment held today due to platelets. Pt denies any problems

## 2013-05-19 ENCOUNTER — Telehealth: Payer: Self-pay | Admitting: *Deleted

## 2013-05-19 NOTE — Telephone Encounter (Signed)
Message from [facility] with [facility]. Pt has a 2 week supply of Xeloda on hand, due to treatment delays. They have a refill available, will hold until due.

## 2013-05-20 ENCOUNTER — Other Ambulatory Visit: Payer: Self-pay | Admitting: Nurse Practitioner

## 2013-05-20 DIAGNOSIS — C189 Malignant neoplasm of colon, unspecified: Secondary | ICD-10-CM

## 2013-05-23 ENCOUNTER — Other Ambulatory Visit: Payer: Self-pay | Admitting: *Deleted

## 2013-05-23 ENCOUNTER — Telehealth: Payer: Self-pay | Admitting: *Deleted

## 2013-05-23 ENCOUNTER — Telehealth: Payer: Self-pay | Admitting: Oncology

## 2013-05-23 NOTE — Telephone Encounter (Signed)
lvm for pt regarding to appts... °

## 2013-05-23 NOTE — Telephone Encounter (Signed)
Pt called for clarification regarding appt "I'm scheduled for lab only 05/23/13---if labs OK shouldn't I get treated as well?  Also doesn't the 06/07/13 appts needed to be canceled?"  Pt treatment held 05/17/13 due to platelets.  Note to MD.

## 2013-05-23 NOTE — Telephone Encounter (Signed)
Informed pt that 05/24/13 she has lab at 1:30 then chemo at 2p.  Also, scheduler will be moving the 06/07/13 appts to 06/14/13.  Pt verbalized understanding and expressed appreciation for call back.

## 2013-05-23 NOTE — Telephone Encounter (Signed)
Per staff message and POF I have scheduled appts.  JMW  

## 2013-05-24 ENCOUNTER — Ambulatory Visit (HOSPITAL_BASED_OUTPATIENT_CLINIC_OR_DEPARTMENT_OTHER): Payer: BC Managed Care – PPO

## 2013-05-24 ENCOUNTER — Other Ambulatory Visit (HOSPITAL_BASED_OUTPATIENT_CLINIC_OR_DEPARTMENT_OTHER): Payer: BC Managed Care – PPO | Admitting: Lab

## 2013-05-24 ENCOUNTER — Other Ambulatory Visit: Payer: Self-pay | Admitting: Oncology

## 2013-05-24 VITALS — BP 131/72 | HR 67 | Temp 97.8°F

## 2013-05-24 DIAGNOSIS — C787 Secondary malignant neoplasm of liver and intrahepatic bile duct: Secondary | ICD-10-CM

## 2013-05-24 DIAGNOSIS — C189 Malignant neoplasm of colon, unspecified: Secondary | ICD-10-CM

## 2013-05-24 DIAGNOSIS — C184 Malignant neoplasm of transverse colon: Secondary | ICD-10-CM

## 2013-05-24 DIAGNOSIS — Z5111 Encounter for antineoplastic chemotherapy: Secondary | ICD-10-CM

## 2013-05-24 LAB — CBC WITH DIFFERENTIAL/PLATELET
BASO%: 0.8 % (ref 0.0–2.0)
Basophils Absolute: 0 10*3/uL (ref 0.0–0.1)
EOS%: 5.1 % (ref 0.0–7.0)
Eosinophils Absolute: 0.2 10*3/uL (ref 0.0–0.5)
HCT: 41.7 % (ref 34.8–46.6)
HGB: 13.9 g/dL (ref 11.6–15.9)
LYMPH%: 12.5 % — ABNORMAL LOW (ref 14.0–49.7)
MCH: 28.2 pg (ref 25.1–34.0)
MCHC: 33.4 g/dL (ref 31.5–36.0)
MCV: 84.4 fL (ref 79.5–101.0)
MONO#: 0.4 10*3/uL (ref 0.1–0.9)
MONO%: 9.5 % (ref 0.0–14.0)
NEUT#: 3.2 10*3/uL (ref 1.5–6.5)
NEUT%: 72.1 % (ref 38.4–76.8)
Platelets: 89 10*3/uL — ABNORMAL LOW (ref 145–400)
RBC: 4.94 10*6/uL (ref 3.70–5.45)
RDW: 18.1 % — ABNORMAL HIGH (ref 11.2–14.5)
WBC: 4.4 10*3/uL (ref 3.9–10.3)
lymph#: 0.6 10*3/uL — ABNORMAL LOW (ref 0.9–3.3)

## 2013-05-24 LAB — COMPREHENSIVE METABOLIC PANEL (CC13)
ALT: 39 U/L (ref 0–55)
AST: 56 U/L — ABNORMAL HIGH (ref 5–34)
Alkaline Phosphatase: 258 U/L — ABNORMAL HIGH (ref 40–150)
BUN: 15.6 mg/dL (ref 7.0–26.0)
Creatinine: 0.8 mg/dL (ref 0.6–1.1)
Total Bilirubin: 1.04 mg/dL (ref 0.20–1.20)

## 2013-05-24 MED ORDER — OXALIPLATIN CHEMO INJECTION 100 MG/20ML
85.0000 mg/m2 | Freq: Once | INTRAVENOUS | Status: AC
  Administered 2013-05-24: 155 mg via INTRAVENOUS
  Filled 2013-05-24: qty 31

## 2013-05-24 MED ORDER — DEXTROSE 5 % IV SOLN
Freq: Once | INTRAVENOUS | Status: AC
  Administered 2013-05-24: 14:00:00 via INTRAVENOUS

## 2013-05-24 MED ORDER — HEPARIN SOD (PORK) LOCK FLUSH 100 UNIT/ML IV SOLN
500.0000 [IU] | Freq: Once | INTRAVENOUS | Status: AC | PRN
  Administered 2013-05-24: 500 [IU]
  Filled 2013-05-24: qty 5

## 2013-05-24 MED ORDER — SODIUM CHLORIDE 0.9 % IJ SOLN
10.0000 mL | INTRAMUSCULAR | Status: DC | PRN
  Administered 2013-05-24: 10 mL
  Filled 2013-05-24: qty 10

## 2013-05-24 MED ORDER — SODIUM CHLORIDE 0.9 % IV SOLN
150.0000 mg | Freq: Once | INTRAVENOUS | Status: AC
  Administered 2013-05-24: 150 mg via INTRAVENOUS
  Filled 2013-05-24: qty 5

## 2013-05-24 MED ORDER — PALONOSETRON HCL INJECTION 0.25 MG/5ML
0.2500 mg | Freq: Once | INTRAVENOUS | Status: AC
  Administered 2013-05-24: 0.25 mg via INTRAVENOUS

## 2013-05-24 MED ORDER — DEXAMETHASONE SODIUM PHOSPHATE 10 MG/ML IJ SOLN
10.0000 mg | Freq: Once | INTRAMUSCULAR | Status: AC
  Administered 2013-05-24: 10 mg via INTRAVENOUS

## 2013-05-24 NOTE — Patient Instructions (Addendum)
[facility] Discharge Instructions for Patients Receiving Chemotherapy  Today you received the following chemotherapy agents oxaliplatin  To help prevent nausea and vomiting after your treatment, we encourage you to take your nausea medication as needed   If you develop nausea and vomiting that is not controlled by your nausea medication, call the clinic.   BELOW ARE SYMPTOMS THAT SHOULD BE REPORTED IMMEDIATELY:  *FEVER GREATER THAN 100.5 F  *CHILLS WITH OR WITHOUT FEVER  NAUSEA AND VOMITING THAT IS NOT CONTROLLED WITH YOUR NAUSEA MEDICATION  *UNUSUAL SHORTNESS OF BREATH  *UNUSUAL BRUISING OR BLEEDING  TENDERNESS IN MOUTH AND THROAT WITH OR WITHOUT PRESENCE OF ULCERS  *URINARY PROBLEMS  *BOWEL PROBLEMS  UNUSUAL RASH Items with * indicate a potential emergency and should be followed up as soon as possible.  Feel free to call the clinic you have any questions or concerns. The clinic phone number is [phone].    

## 2013-05-24 NOTE — Progress Notes (Signed)
Per Dr Truett Perna, okay to tx with plts 89,000.  SLJ

## 2013-05-31 ENCOUNTER — Telehealth: Payer: Self-pay | Admitting: Oncology

## 2013-05-31 NOTE — Telephone Encounter (Signed)
pt called and wants to move chemo to 8/14 due to conflict in  pm appt. Called nurse and left message

## 2013-06-06 ENCOUNTER — Telehealth: Payer: Self-pay | Admitting: *Deleted

## 2013-06-06 NOTE — Telephone Encounter (Signed)
Per Dr. Truett Perna: OK to move chemo appt to following day (8/14). Request sent to schedulers.

## 2013-06-06 NOTE — Telephone Encounter (Signed)
Message from pt requesting to reschedule 8/13 chemo to following day. Unable to keep appt at that time. Can leave MD appt as is, or move to earlier in the week if MD thinks 23 days is too long without treatment (CapeOx Q 21 days.)

## 2013-06-07 ENCOUNTER — Ambulatory Visit: Payer: BC Managed Care – PPO

## 2013-06-07 ENCOUNTER — Other Ambulatory Visit: Payer: BC Managed Care – PPO | Admitting: Lab

## 2013-06-07 ENCOUNTER — Telehealth: Payer: Self-pay | Admitting: *Deleted

## 2013-06-07 ENCOUNTER — Ambulatory Visit: Payer: BC Managed Care – PPO | Admitting: Oncology

## 2013-06-07 NOTE — Telephone Encounter (Signed)
Spoke with pt, appts have been rescheduled. She reports she missed one dose of Xeloda this cycle because she forgot. Asks if she should take one dose tomorrow morning to "make up" for missed dose. Instructed her to complete cycle tonight as scheduled. Do not add another dose so as not to decrease her "break" period. She voiced understanding.

## 2013-06-12 ENCOUNTER — Other Ambulatory Visit: Payer: Self-pay | Admitting: Oncology

## 2013-06-13 ENCOUNTER — Encounter: Payer: Self-pay | Admitting: *Deleted

## 2013-06-13 NOTE — Progress Notes (Signed)
RECEIVED A FAX FROM [facility] CONCERNING A CONFIRMATION OF PRESCRIPTION SHIPMENT FOR CAPECITABINE ON 06/10/13.

## 2013-06-15 ENCOUNTER — Telehealth: Payer: Self-pay | Admitting: *Deleted

## 2013-06-15 ENCOUNTER — Other Ambulatory Visit (HOSPITAL_BASED_OUTPATIENT_CLINIC_OR_DEPARTMENT_OTHER): Payer: BC Managed Care – PPO | Admitting: Lab

## 2013-06-15 ENCOUNTER — Telehealth: Payer: Self-pay | Admitting: Oncology

## 2013-06-15 ENCOUNTER — Ambulatory Visit: Payer: BC Managed Care – PPO

## 2013-06-15 ENCOUNTER — Ambulatory Visit (HOSPITAL_BASED_OUTPATIENT_CLINIC_OR_DEPARTMENT_OTHER): Payer: BC Managed Care – PPO | Admitting: Nurse Practitioner

## 2013-06-15 VITALS — BP 121/76 | HR 72 | Temp 97.0°F | Resp 18 | Ht 67.0 in | Wt 153.3 lb

## 2013-06-15 DIAGNOSIS — C189 Malignant neoplasm of colon, unspecified: Secondary | ICD-10-CM

## 2013-06-15 DIAGNOSIS — G43909 Migraine, unspecified, not intractable, without status migrainosus: Secondary | ICD-10-CM

## 2013-06-15 DIAGNOSIS — D696 Thrombocytopenia, unspecified: Secondary | ICD-10-CM

## 2013-06-15 DIAGNOSIS — C787 Secondary malignant neoplasm of liver and intrahepatic bile duct: Secondary | ICD-10-CM

## 2013-06-15 DIAGNOSIS — R110 Nausea: Secondary | ICD-10-CM

## 2013-06-15 DIAGNOSIS — R109 Unspecified abdominal pain: Secondary | ICD-10-CM

## 2013-06-15 DIAGNOSIS — C187 Malignant neoplasm of sigmoid colon: Secondary | ICD-10-CM

## 2013-06-15 LAB — CBC WITH DIFFERENTIAL/PLATELET
EOS%: 2.8 % (ref 0.0–7.0)
Eosinophils Absolute: 0.1 10*3/uL (ref 0.0–0.5)
MCH: 29.3 pg (ref 25.1–34.0)
MCV: 86.9 fL (ref 79.5–101.0)
MONO%: 13.7 % (ref 0.0–14.0)
NEUT#: 1.9 10*3/uL (ref 1.5–6.5)
RBC: 4.54 10*6/uL (ref 3.70–5.45)
RDW: 21.4 % — ABNORMAL HIGH (ref 11.2–14.5)
lymph#: 0.4 10*3/uL — ABNORMAL LOW (ref 0.9–3.3)

## 2013-06-15 LAB — COMPREHENSIVE METABOLIC PANEL (CC13)
AST: 70 U/L — ABNORMAL HIGH (ref 5–34)
Albumin: 3.4 g/dL — ABNORMAL LOW (ref 3.5–5.0)
Alkaline Phosphatase: 297 U/L — ABNORMAL HIGH (ref 40–150)
Potassium: 4.8 mEq/L (ref 3.5–5.1)
Sodium: 140 mEq/L (ref 136–145)
Total Protein: 7.5 g/dL (ref 6.4–8.3)

## 2013-06-15 MED ORDER — OXYCODONE-ACETAMINOPHEN 5-325 MG PO TABS: 1.0000 | ORAL_TABLET | ORAL | Status: DC | PRN

## 2013-06-15 MED ORDER — SUMATRIPTAN SUCCINATE 100 MG PO TABS: 100.0000 mg | ORAL_TABLET | ORAL | Status: DC | PRN

## 2013-06-15 NOTE — Progress Notes (Addendum)
OFFICE PROGRESS NOTE  Interval history:  Natalie Mccarthy returns as scheduled. She completed cycle 2 CAPOX beginning 05/24/2013. Following the oxaliplatin she had mild nausea. No vomiting. She was quite tired" for a few days. No diarrhea. No mouth sores. She denies hand or foot pain or redness.  Over the past 3 days she has had persistent discomfort/pain at the upper abdomen. The "severity" varies. She has not taken any pain medication for fear of masking the cause of the pain. Bowels overall have been moving regularly. She has noted increased frequency of bowel movements recently. She is intermittently mildly nauseated. She denies back pain.   Objective: Blood pressure 121/76, pulse 72, temperature 97 F (36.1 C), temperature source Oral, resp. rate 18, height 5\' 7"  (1.702 m), weight 153 lb 4.8 oz (69.536 kg).  No thrush or ulcerations. Lungs clear. Regular cardiac rhythm. Port-A-Cath site without erythema. Abdomen is soft and nontender. No hepatomegaly. Question palpable spleen tip. No apparent ascites. No mass. Extremities without edema.  Lab Results: Lab Results  Component Value Date   WBC 2.8* 06/15/2013   HGB 13.3 06/15/2013   HCT 39.5 06/15/2013   MCV 86.9 06/15/2013   PLT 65* 06/15/2013    Chemistry:    Chemistry      Component Value Date/Time   NA 140 06/15/2013 0944   NA 140 04/13/2013 0600   NA 143 01/27/2012 1354   K 4.8 06/15/2013 0944   K 3.5 04/13/2013 0600   K 3.8 01/27/2012 1354   CL 101 04/15/2013 1230   CL 108 04/13/2013 0600   CL 97* 01/27/2012 1354   CO2 27 06/15/2013 0944   CO2 25 04/13/2013 0600   CO2 29 01/27/2012 1354   BUN 13.2 06/15/2013 0944   BUN 9 04/13/2013 0600   BUN 11 01/27/2012 1354   CREATININE 0.8 06/15/2013 0944   CREATININE 0.62 04/13/2013 0600   CREATININE 0.9 01/27/2012 1354      Component Value Date/Time   CALCIUM 9.8 06/15/2013 0944   CALCIUM 7.9* 04/13/2013 0600   CALCIUM 8.9 01/27/2012 1354   ALKPHOS 297* 06/15/2013 0944   ALKPHOS 500* 04/13/2013 0600    ALKPHOS 198* 01/27/2012 1354   AST 70* 06/15/2013 0944   AST 214* 04/13/2013 0600   AST 34 01/27/2012 1354   ALT 59* 06/15/2013 0944   ALT 220* 04/13/2013 0600   ALT 41 01/27/2012 1354   BILITOT 0.76 06/15/2013 0944   BILITOT 3.9* 04/13/2013 0600   BILITOT 0.50 01/27/2012 1354       Studies/Results: No results found.  Medications: I have reviewed the patient's current medications.  Assessment/Plan:  1.Metastatic colon cancer with multiple liver metastases noted on an MRI of the abdomen 12/02/2010 and on a CT 12/13/2010. A transverse colon mass was noted on the CT from 02/11/2011 and confirmed on a colonoscopy 12/17/2010. She began treatment with FOLFIRI/Avastin 12/31/2010. A restaging CT 06/20/2011 confirmed further improvement in the metastatic liver lesions and no evidence for progressive metastatic disease. A restaging CT 08/01/2011 showed stable liver lesions and no new lesions. Restaging CT 09/12/2011 showed stable liver lesions and no new lesions. A restaging CT 10/27/2011 showed stable liver lesions and no new lesions. Restaging CT 12/05/2011 showed mild decrease in size of hepatic metastases and no evidence of disease progression. Restaging CT 01/26/2012 showed stable hepatic metastases and no new lesions. Restaging CT may 5/8/ 2013 revealed a decreased size of hepatic metastases and no new lesions. Restaging CT 04/23/2012 revealed no change in the measurable "  target" lesions, a slight increase in the size of other lesions, and no new lesions. Restaging CT 06/04/2012 showed multiple hepatic metastases were grossly unchanged. Restaging CT 07/19/2012 with stable disease in the liver. Infusional 5-fluorouracil continued on a 2 week schedule. Treatment was held after the 08/03/2012 cycle due to thrombocytopenia. Restaging CT evaluation 08/30/2012 with mild increase in size of large hepatic metastases. Treatment was resumed with 5 fluorouracil and Avastin on 08/31/2012. Restaging CT  10/14/2012-stable hepatic metastases. Treatment continued with 5 fluorouracil and Avastin. Restaging CT 12/01/2012 revealed mild progression in the size of multiple hepatic lesions. Treatment was placed on hold. CEA 32.1 on 01/06/2013 and 71.9 on 03/11/2013. Restaging CT abdomen/pelvis 03/31/2013 with interval progression of liver metastases. She began cycle 1 CAPOX on 04/15/2013. The Xeloda was prematurely discontinued on 04/25/2013 due to redness and pain over the feet. She completed cycle 2 beginning 05/24/2013. 2. Abdominal pain secondary to hepatomegaly/liver metastases.  3. Anorexia, early satiety, and weight loss secondary to metastatic colon cancer-resolved.  4. Microcytic anemia. Resolved.  5. History of migraine headaches.  6. Status post uterine fibroid surgery.  7. Port-A-Cath placement 12/20/2010.  8. Delayed nausea following cycle 1 of FOLFIRI/Avastin. Improved with the addition of Aloxi and prophylactic Decadron beginning with cycle #2. The prophylactic Decadron was tapered. We increased the day 1 Decadron back to 20 mg.  9. Neutropenia secondary to chemotherapy. Cycle #4 was held on 02/11/2011. She subsequently received Neulasta support.  10. History of intermittent fever with no apparent source for infection, likely "tumor fever" or fever related to chemotherapy. No recent fever.  11. History of mucositis secondary to 5-FU, initially grade 2. Irinotecan and 5-FU were dose reduced per study guidelines.  12. History of abdominal cramping. ? Related to irinotecan, 5-FU, or potentially the colon tumor. No recent abdominal cramping.  13. Thrombocytopenia secondary to chemotherapy (while being treated with irinotecan and Avastin). Treatment was held for thrombocytopenia per study guidelines. The thrombocytopenia may be related to Avastin, or chronic liver disease from chemotherapy. The CT on 04/23/2012 confirmed mild splenomegaly. The CT on 06/04/2012 again showed splenomegaly. The  thrombocytopenia persisted despite being off of irinotecan and Avastin for several months. The thrombocytopenia improved. She developed thrombocytopenia following cycle 1 CAPOX.  14. Acne-like rash on the face. Question related to steroids. Resolved.  15. Arthralgias-most likely unrelated to the colon cancer diagnosis and chemotherapy. She has been evaluated by orthopedics. The etiology of the arthralgias is unclear.  16. Persistent discomfort at the right shoulder. Negative CT of the right upper extremity on 07/19/2012. MRI on 08/06/2012 with a partial thickness bursal surface tear of the supraspinatus tendon, moderate rotator cuff tendinopathy/tendinosis. She is now followed by orthopedics. The pain is stable. The range of motion at the right shoulder has improved  17.? Swelling at the right upper extremity and chest wall April 2014-negative right upper extremity Doppler.  18. Delayed nausea following cycle 1 CAPOX. We will adjust the premedication regimen to include Emend and Aloxi.  19. Mucositis and hand-foot syndrome secondary to Xeloda. The Xeloda will be dose reduced to 1000 mg twice daily for 14 days with cycle 2.  20. Thrombocytopenia following cycle 1 CAPOX. Cycle 2 was delayed due to thrombocytopenia. She has progressive thrombocytopenia on labs today. 21.New onset pruritus 04/04/2013-hyperbilirubinemia and an elevated alkaline phosphatase noted on labs 04/06/2013, the CT 03/31/2013 revealed peripheral biliary ductal dilatation in the medial segment of the left hepatic lobe,no options for intervention after evaluation by Dr. Ewing Schlein. The hyperbilirubinemia has  resolved. 22. Three-day history of abdominal discomfort/pain.  Disposition-Natalie Mccarthy appears stable. She has now completed 2 cycles of CAPOX chemotherapy. She has progressive thrombocytopenia. Treatment will be held tomorrow and rescheduled to 06/21/2013. Treatment schedule will subsequently be changed to a 4 week interval.  The  etiology of the abdominal discomfort/pain is unclear. She will try Ultram. We also gave her a new prescription for Percocet. She knows to contact the office if the pain persists, worsens or she develops any new concerning symptoms.  We will see her in followup prior to cycle 4 CAPOX on 07/19/2013.  Of note, we will obtain a followup CEA when she returns for cycle 3 in one week.  Patient seen with Dr. Truett Perna.  Natalie Mccarthy ANP/GNP-BC   Natalie Mccarthy was interviewed and examined. This was a shared visit with Natalie Mccarthy.  She has completed 2 cycles of CAPOX.cycle 2 was delayed secondary to thrombocytopenia. She presents today for cycle 3. She again has moderate thrombocytopenia and the chemotherapy will be held. The thrombocytopenia is most likely secondary to heavy pretreatment with chemotherapy and potentially liver toxicity from chemotherapy.  Today's chemotherapy will be held and she will return in one week for cycle 3.  She has developed upper abdominal pain. The pain could be related to the primary colon tumor, progressive liver metastases, or carcinomatosis. She will contact us for persistent pain.  Natalie Mccarthy will return for the next cycle of CAPOX in one week.  Mancel Bale, M.D.

## 2013-06-15 NOTE — Telephone Encounter (Signed)
Per staff message and POF I have scheduled appts.  JMW  

## 2013-06-15 NOTE — Telephone Encounter (Signed)
gv and printed appt sched and avs for pt...emailed MW to add tx... °

## 2013-06-16 ENCOUNTER — Encounter: Payer: Self-pay | Admitting: Oncology

## 2013-06-16 ENCOUNTER — Ambulatory Visit: Payer: BC Managed Care – PPO

## 2013-06-16 NOTE — Progress Notes (Signed)
Faxed disability form to [place] @ [phone].

## 2013-06-20 ENCOUNTER — Telehealth: Payer: Self-pay | Admitting: *Deleted

## 2013-06-20 NOTE — Telephone Encounter (Signed)
Message from pt reporting persistent abdominal pain. Pain does seem to "move around". Taking Maalox, Ultram and Protonix. Mentioned this to NP with last visit was told to call if this persists. Reviewed with Dr. Truett Perna: If pain is constant,may be related to the cancer, should improve with treatment. Pt denies need for stronger pain med. She has Rx for Oxycodone, has not picked up yet.

## 2013-06-21 ENCOUNTER — Telehealth: Payer: Self-pay | Admitting: *Deleted

## 2013-06-21 ENCOUNTER — Ambulatory Visit: Payer: BC Managed Care – PPO

## 2013-06-21 ENCOUNTER — Other Ambulatory Visit (HOSPITAL_BASED_OUTPATIENT_CLINIC_OR_DEPARTMENT_OTHER): Payer: BC Managed Care – PPO | Admitting: Lab

## 2013-06-21 DIAGNOSIS — C189 Malignant neoplasm of colon, unspecified: Secondary | ICD-10-CM

## 2013-06-21 DIAGNOSIS — C187 Malignant neoplasm of sigmoid colon: Secondary | ICD-10-CM

## 2013-06-21 LAB — CBC WITH DIFFERENTIAL/PLATELET
BASO%: 0.6 % (ref 0.0–2.0)
EOS%: 3.1 % (ref 0.0–7.0)
MCH: 29.2 pg (ref 25.1–34.0)
MCHC: 33.5 g/dL (ref 31.5–36.0)
MCV: 87.2 fL (ref 79.5–101.0)
MONO%: 13.3 % (ref 0.0–14.0)
NEUT#: 2.5 10*3/uL (ref 1.5–6.5)
RBC: 4.56 10*6/uL (ref 3.70–5.45)
RDW: 20.1 % — ABNORMAL HIGH (ref 11.2–14.5)

## 2013-06-21 LAB — COMPREHENSIVE METABOLIC PANEL (CC13)
AST: 65 U/L — ABNORMAL HIGH (ref 5–34)
Albumin: 3.4 g/dL — ABNORMAL LOW (ref 3.5–5.0)
Alkaline Phosphatase: 335 U/L — ABNORMAL HIGH (ref 40–150)
Potassium: 4.4 mEq/L (ref 3.5–5.1)
Sodium: 140 mEq/L (ref 136–145)
Total Protein: 7.6 g/dL (ref 6.4–8.3)

## 2013-06-21 NOTE — Telephone Encounter (Signed)
Per staff message and POF I have scheduled appts.  JMW  

## 2013-06-21 NOTE — Progress Notes (Signed)
CBC reviewed by Dr. Truett Perna: Reschedule chemo for one week. Orders sent to schedulers to adjust future appts as well.

## 2013-06-27 ENCOUNTER — Other Ambulatory Visit: Payer: Self-pay | Admitting: *Deleted

## 2013-06-27 DIAGNOSIS — C189 Malignant neoplasm of colon, unspecified: Secondary | ICD-10-CM

## 2013-06-27 DIAGNOSIS — C787 Secondary malignant neoplasm of liver and intrahepatic bile duct: Secondary | ICD-10-CM

## 2013-06-27 MED ORDER — TRAMADOL HCL 50 MG PO TABS: 50.0000 mg | ORAL_TABLET | Freq: Four times a day (QID) | ORAL | Status: DC | PRN

## 2013-06-28 ENCOUNTER — Ambulatory Visit: Payer: BC Managed Care – PPO

## 2013-06-28 ENCOUNTER — Other Ambulatory Visit (HOSPITAL_BASED_OUTPATIENT_CLINIC_OR_DEPARTMENT_OTHER): Payer: BC Managed Care – PPO | Admitting: Lab

## 2013-06-28 DIAGNOSIS — C189 Malignant neoplasm of colon, unspecified: Secondary | ICD-10-CM

## 2013-06-28 LAB — CBC WITH DIFFERENTIAL/PLATELET
Eosinophils Absolute: 0.1 10*3/uL (ref 0.0–0.5)
HCT: 39.3 % (ref 34.8–46.6)
LYMPH%: 7.6 % — ABNORMAL LOW (ref 14.0–49.7)
MCHC: 34 g/dL (ref 31.5–36.0)
MCV: 86.4 fL (ref 79.5–101.0)
MONO%: 13.9 % (ref 0.0–14.0)
NEUT%: 75.8 % (ref 38.4–76.8)
Platelets: 67 10*3/uL — ABNORMAL LOW (ref 145–400)
RBC: 4.54 10*6/uL (ref 3.70–5.45)

## 2013-06-28 NOTE — Progress Notes (Signed)
Pt pltz count was 67. Per Dr. Truett Perna pt will be held this week for tx (would like her counts to be above 80), and will come in next week for another lab appointment, to see him and possible infusion. Pt is aware of the upcoming tx plans and verbalized her understanding.

## 2013-07-05 ENCOUNTER — Telehealth: Payer: Self-pay | Admitting: *Deleted

## 2013-07-05 ENCOUNTER — Ambulatory Visit (HOSPITAL_BASED_OUTPATIENT_CLINIC_OR_DEPARTMENT_OTHER): Payer: BC Managed Care – PPO

## 2013-07-05 ENCOUNTER — Other Ambulatory Visit (HOSPITAL_BASED_OUTPATIENT_CLINIC_OR_DEPARTMENT_OTHER): Payer: BC Managed Care – PPO | Admitting: Lab

## 2013-07-05 ENCOUNTER — Ambulatory Visit (HOSPITAL_BASED_OUTPATIENT_CLINIC_OR_DEPARTMENT_OTHER): Payer: BC Managed Care – PPO | Admitting: Oncology

## 2013-07-05 ENCOUNTER — Telehealth: Payer: Self-pay | Admitting: Oncology

## 2013-07-05 VITALS — BP 134/74 | HR 76 | Temp 97.8°F | Resp 20 | Ht 67.0 in | Wt 150.7 lb

## 2013-07-05 DIAGNOSIS — R109 Unspecified abdominal pain: Secondary | ICD-10-CM

## 2013-07-05 DIAGNOSIS — C187 Malignant neoplasm of sigmoid colon: Secondary | ICD-10-CM

## 2013-07-05 DIAGNOSIS — C787 Secondary malignant neoplasm of liver and intrahepatic bile duct: Secondary | ICD-10-CM

## 2013-07-05 DIAGNOSIS — C189 Malignant neoplasm of colon, unspecified: Secondary | ICD-10-CM

## 2013-07-05 DIAGNOSIS — Z5111 Encounter for antineoplastic chemotherapy: Secondary | ICD-10-CM

## 2013-07-05 DIAGNOSIS — L299 Pruritus, unspecified: Secondary | ICD-10-CM

## 2013-07-05 DIAGNOSIS — D696 Thrombocytopenia, unspecified: Secondary | ICD-10-CM

## 2013-07-05 LAB — COMPREHENSIVE METABOLIC PANEL (CC13)
ALT: 92 U/L — ABNORMAL HIGH (ref 0–55)
AST: 99 U/L — ABNORMAL HIGH (ref 5–34)
Alkaline Phosphatase: 454 U/L — ABNORMAL HIGH (ref 40–150)
CO2: 26 mEq/L (ref 22–29)
Creatinine: 0.8 mg/dL (ref 0.6–1.1)
Sodium: 141 mEq/L (ref 136–145)
Total Bilirubin: 0.85 mg/dL (ref 0.20–1.20)
Total Protein: 7.8 g/dL (ref 6.4–8.3)

## 2013-07-05 LAB — CBC WITH DIFFERENTIAL/PLATELET
BASO%: 0.9 % (ref 0.0–2.0)
EOS%: 3.3 % (ref 0.0–7.0)
LYMPH%: 9.3 % — ABNORMAL LOW (ref 14.0–49.7)
MCH: 29.1 pg (ref 25.1–34.0)
MCHC: 33.6 g/dL (ref 31.5–36.0)
MONO#: 0.5 10*3/uL (ref 0.1–0.9)
MONO%: 11.1 % (ref 0.0–14.0)
Platelets: 90 10*3/uL — ABNORMAL LOW (ref 145–400)
RBC: 4.6 10*6/uL (ref 3.70–5.45)
WBC: 4.1 10*3/uL (ref 3.9–10.3)

## 2013-07-05 MED ORDER — DEXAMETHASONE SODIUM PHOSPHATE 10 MG/ML IJ SOLN
10.0000 mg | Freq: Once | INTRAMUSCULAR | Status: AC
  Administered 2013-07-05: 10 mg via INTRAVENOUS

## 2013-07-05 MED ORDER — DEXTROSE 5 % IV SOLN: Freq: Once | INTRAVENOUS | Status: DC

## 2013-07-05 MED ORDER — OXALIPLATIN CHEMO INJECTION 100 MG/20ML
65.0000 mg/m2 | Freq: Once | INTRAVENOUS | Status: AC
  Administered 2013-07-05: 120 mg via INTRAVENOUS
  Filled 2013-07-05: qty 24

## 2013-07-05 MED ORDER — PALONOSETRON HCL INJECTION 0.25 MG/5ML
0.2500 mg | Freq: Once | INTRAVENOUS | Status: AC
  Administered 2013-07-05: 0.25 mg via INTRAVENOUS

## 2013-07-05 MED ORDER — SODIUM CHLORIDE 0.9 % IJ SOLN
10.0000 mL | INTRAMUSCULAR | Status: DC | PRN
  Administered 2013-07-05: 10 mL
  Filled 2013-07-05: qty 10

## 2013-07-05 MED ORDER — HEPARIN SOD (PORK) LOCK FLUSH 100 UNIT/ML IV SOLN
500.0000 [IU] | Freq: Once | INTRAVENOUS | Status: AC | PRN
  Administered 2013-07-05: 500 [IU]
  Filled 2013-07-05: qty 5

## 2013-07-05 MED ORDER — SODIUM CHLORIDE 0.9 % IV SOLN
150.0000 mg | Freq: Once | INTRAVENOUS | Status: AC
  Administered 2013-07-05: 150 mg via INTRAVENOUS
  Filled 2013-07-05: qty 5

## 2013-07-05 NOTE — Telephone Encounter (Signed)
Call from pt reporting she should have been scheduled for L/MD/chemo today. Per last week's note, treatment was held for thrombocytopenia-- should have been rescheduled for this week. Pt will be worked in for lab/MD will evaluate CBC for chemo.

## 2013-07-05 NOTE — Progress Notes (Signed)
Ok to treat with platelets 90 per Dr. Truett Perna note dtd 07/05/13.

## 2013-07-05 NOTE — Telephone Encounter (Signed)
gv pt husband appt schedule for September. Per 9/2 pof lb/LT/tx 9/23. Per husband 9/16 lb.fu.tx should be cx'd. Message to [facility] to confirm.

## 2013-07-05 NOTE — Progress Notes (Signed)
[facility]    OFFICE PROGRESS NOTE   INTERVAL HISTORY:   She completed a second cycle of CAPOX beginning on 05/24/2013. Cycle 3 has been delayed for the past several weeks secondary to thrombocytopenia.  She continues to have mid and upper abdominal pain. The pain is controlled with tramadol. She takes tramadol 3 times per day. She occasionally has pain in the lower bilateral anterior chest. No difficulty with bowel or bladder function. Mild intermittent nausea. No emesis. Maalox did not help the pain. No neuropathy symptoms.  Objective:  Vital signs in last 24 hours:  Blood pressure 134/74, pulse 76, temperature 97.8 F (36.6 C), temperature source Oral, resp. rate 20, height 5\' 7"  (1.702 m), weight 150 lb 11.2 oz (68.357 kg).    HEENT:  No thrush or ulcers Resp: Lungs clear bilaterally Cardio: Regular rate and rhythm GI: Nontender, no hepatosplenomegaly, no mass Vascular: No leg edema   Lab Results:  Lab Results  Component Value Date   WBC 4.1 07/05/2013   HGB 13.4 07/05/2013   HCT 39.9 07/05/2013   MCV 86.7 07/05/2013   PLT 90* 07/05/2013   ANC 3.1    Medications: I have reviewed the patient's current medications.  Assessment/Plan: 1.Metastatic colon cancer with multiple liver metastases noted on an MRI of the abdomen 12/02/2010 and on a CT 12/13/2010. A transverse colon mass was noted on the CT from 02/11/2011 and confirmed on a colonoscopy 12/17/2010. She began treatment with FOLFIRI/Avastin 12/31/2010. A restaging CT 06/20/2011 confirmed further improvement in the metastatic liver lesions and no evidence for progressive metastatic disease. A restaging CT 08/01/2011 showed stable liver lesions and no new lesions. Restaging CT 09/12/2011 showed stable liver lesions and no new lesions. A restaging CT 10/27/2011 showed stable liver lesions and no new lesions. Restaging CT 12/05/2011 showed mild decrease in size of hepatic metastases and no evidence of disease  progression. Restaging CT 01/26/2012 showed stable hepatic metastases and no new lesions. Restaging CT may 5/8/ 2013 revealed a decreased size of hepatic metastases and no new lesions. Restaging CT 04/23/2012 revealed no change in the measurable "target" lesions, a slight increase in the size of other lesions, and no new lesions. Restaging CT 06/04/2012 showed multiple hepatic metastases were grossly unchanged. Restaging CT 07/19/2012 with stable disease in the liver. Infusional 5-fluorouracil continued on a 2 week schedule. Treatment was held after the 08/03/2012 cycle due to thrombocytopenia. Restaging CT evaluation 08/30/2012 with mild increase in size of large hepatic metastases. Treatment was resumed with 5 fluorouracil and Avastin on 08/31/2012. Restaging CT 10/14/2012-stable hepatic metastases. Treatment continued with 5 fluorouracil and Avastin. Restaging CT 12/01/2012 revealed mild progression in the size of multiple hepatic lesions. Treatment was placed on hold. CEA 32.1 on 01/06/2013 and 71.9 on 03/11/2013. Restaging CT abdomen/pelvis 03/31/2013 with interval progression of liver metastases. She began cycle 1 CAPOX on 04/15/2013. The Xeloda was prematurely discontinued on 04/25/2013 due to redness and pain over the feet. She completed cycle 2 beginning 05/24/2013.  2. Abdominal pain-? Secondary to liver metastases, metastatic adenopathy, or the primary colon tumor. Less likely peptic ulcer disease 3. Anorexia, early satiety, and weight loss secondary to metastatic colon cancer-resolved.  4. Microcytic anemia. Resolved.  5. History of migraine headaches.  6. Status post uterine fibroid surgery.  7. Port-A-Cath placement 12/20/2010.  8. Delayed nausea following cycle 1 of FOLFIRI/Avastin. Improved with the addition of Aloxi and prophylactic Decadron beginning with cycle #2. The prophylactic Decadron was tapered. We increased the  day 1 Decadron back to 20 mg.  9. Neutropenia secondary to  chemotherapy. Cycle #4 was held on 02/11/2011. She subsequently received Neulasta support.  10. History of intermittent fever with no apparent source for infection, likely "tumor fever" or fever related to chemotherapy. No recent fever.  11. History of mucositis secondary to 5-FU, initially grade 2. Irinotecan and 5-FU were dose reduced per study guidelines.  12. History of abdominal cramping. ? Related to irinotecan, 5-FU, or potentially the colon tumor. No recent abdominal cramping.  13. Thrombocytopenia secondary to chemotherapy (while being treated with irinotecan and Avastin). Treatment was held for thrombocytopenia per study guidelines. The thrombocytopenia may be related to Avastin, or chronic liver disease from chemotherapy. The CT on 04/23/2012 confirmed mild splenomegaly. The CT on 06/04/2012 again showed splenomegaly. The thrombocytopenia persisted despite being off of irinotecan and Avastin for several months. The thrombocytopenia improved. She developed thrombocytopenia following cycle 1 and cycle 2 CAPOX.  14. Acne-like rash on the face. Question related to steroids. Resolved.  15. Arthralgias-most likely unrelated to the colon cancer diagnosis and chemotherapy. She has been evaluated by orthopedics. The etiology of the arthralgias is unclear.  16. Persistent discomfort at the right shoulder. Negative CT of the right upper extremity on 07/19/2012. MRI on 08/06/2012 with a partial thickness bursal surface tear of the supraspinatus tendon, moderate rotator cuff tendinopathy/tendinosis. She is now followed by orthopedics. The pain is stable. The range of motion at the right shoulder has improved  17.? Swelling at the right upper extremity and chest wall April 2014-negative right upper extremity Doppler.  18. Delayed nausea following cycle 1 CAPOX. Emend and Aloxi were added with cycle 2. 19. Mucositis and hand-foot syndrome secondary to Xeloda. The Xeloda was dose reduced to 1000 mg twice daily  for 14 days with cycle 2.  20. Thrombocytopenia following cycle 1 CAPOX. Cycle 2 and cycle 3 were delayed due to thrombocytopenia. We will further dose reduce the oxaliplatin with cycle 3 today.  21.New onset pruritus 04/04/2013-hyperbilirubinemia and an elevated alkaline phosphatase noted on labs 04/06/2013, the CT 03/31/2013 revealed peripheral biliary ductal dilatation in the medial segment of the left hepatic lobe,no options for intervention after evaluation by Dr. Ewing Schlein. The hyperbilirubinemia has resolved.    Disposition:  The platelets have partially recovered. The plan is to proceed with cycle 3 of CAPOX today. We will dose reduce the oxaliplatin with cycle 3. She will return for an office visit and cycle 4 in 3 weeks.  The pain is most likely related to liver metastases, metastatic adenopathy, or the primary colon tumor. She will contact us for increased pain. We will check a CEA when she returns in 3 weeks with the plan to obtain a restaging CT after cycle 4.   Thornton Papas, MD  07/05/2013  1:11 PM

## 2013-07-06 ENCOUNTER — Other Ambulatory Visit: Payer: Self-pay | Admitting: *Deleted

## 2013-07-06 ENCOUNTER — Ambulatory Visit: Payer: BC Managed Care – PPO

## 2013-07-06 DIAGNOSIS — C189 Malignant neoplasm of colon, unspecified: Secondary | ICD-10-CM

## 2013-07-06 MED ORDER — ALPRAZOLAM 0.5 MG PO TABS: 0.5000 mg | ORAL_TABLET | Freq: Two times a day (BID) | ORAL | Status: DC | PRN

## 2013-07-19 ENCOUNTER — Ambulatory Visit: Payer: BC Managed Care – PPO | Admitting: Nurse Practitioner

## 2013-07-19 ENCOUNTER — Other Ambulatory Visit: Payer: BC Managed Care – PPO | Admitting: Lab

## 2013-07-19 ENCOUNTER — Ambulatory Visit: Payer: BC Managed Care – PPO

## 2013-07-22 ENCOUNTER — Encounter: Payer: Self-pay | Admitting: *Deleted

## 2013-07-22 NOTE — Progress Notes (Signed)
RECEIVED A FAX FROM [facility] CONCERNING A CONFIRMATION OF PRESCRIPTION SHIPMENT FOR CAPECITABINE ON 07/21/13.

## 2013-07-24 ENCOUNTER — Other Ambulatory Visit: Payer: Self-pay | Admitting: Oncology

## 2013-07-25 ENCOUNTER — Emergency Department (HOSPITAL_COMMUNITY): Payer: BC Managed Care – PPO

## 2013-07-25 ENCOUNTER — Encounter (HOSPITAL_COMMUNITY): Payer: Self-pay | Admitting: Emergency Medicine

## 2013-07-25 ENCOUNTER — Emergency Department (HOSPITAL_COMMUNITY)
Admission: EM | Admit: 2013-07-25 | Discharge: 2013-07-25 | Disposition: A | Discharge status: Home / Self Care | Payer: BC Managed Care – PPO | Attending: Emergency Medicine | Admitting: Emergency Medicine

## 2013-07-25 DIAGNOSIS — R071 Chest pain on breathing: Secondary | ICD-10-CM | POA: Insufficient documentation

## 2013-07-25 DIAGNOSIS — K2970 Gastritis, unspecified, without bleeding: Secondary | ICD-10-CM

## 2013-07-25 DIAGNOSIS — Z85038 Personal history of other malignant neoplasm of large intestine: Secondary | ICD-10-CM | POA: Insufficient documentation

## 2013-07-25 DIAGNOSIS — Z8619 Personal history of other infectious and parasitic diseases: Secondary | ICD-10-CM | POA: Insufficient documentation

## 2013-07-25 DIAGNOSIS — G43909 Migraine, unspecified, not intractable, without status migrainosus: Secondary | ICD-10-CM | POA: Insufficient documentation

## 2013-07-25 DIAGNOSIS — R0781 Pleurodynia: Secondary | ICD-10-CM

## 2013-07-25 DIAGNOSIS — Z862 Personal history of diseases of the blood and blood-forming organs and certain disorders involving the immune mechanism: Secondary | ICD-10-CM | POA: Insufficient documentation

## 2013-07-25 DIAGNOSIS — Z79899 Other long term (current) drug therapy: Secondary | ICD-10-CM | POA: Insufficient documentation

## 2013-07-25 LAB — BASIC METABOLIC PANEL
GFR calc Af Amer: >90 mL/min (ref >90)
GFR calc non Af Amer: >90 mL/min (ref >90)
Potassium: 4.3 mEq/L (ref 3.5–5.1)
Sodium: 136 mEq/L (ref 135–145)

## 2013-07-25 LAB — CBC
MCHC: 34.1 g/dL (ref 30.0–36.0)
Platelets: DECREASED 10*3/uL (ref 150–400)
RDW: 18.2 % — ABNORMAL HIGH (ref 11.5–15.5)

## 2013-07-25 LAB — LIPASE, BLOOD: Lipase: 28 U/L (ref 11–59)

## 2013-07-25 LAB — POCT I-STAT TROPONIN I

## 2013-07-25 LAB — TROPONIN I: Troponin I: <0.3 ng/mL (ref <0.30)

## 2013-07-25 MED ORDER — HYDROMORPHONE HCL PF 1 MG/ML IJ SOLN
1.0000 mg | Freq: Once | INTRAMUSCULAR | Status: AC
  Administered 2013-07-25: 1 mg via INTRAVENOUS
  Filled 2013-07-25: qty 1

## 2013-07-25 MED ORDER — IOHEXOL 350 MG/ML SOLN
100.0000 mL | Freq: Once | INTRAVENOUS | Status: AC | PRN
  Administered 2013-07-25: 100 mL via INTRAVENOUS

## 2013-07-25 MED ORDER — PANTOPRAZOLE SODIUM 40 MG IV SOLR
40.0000 mg | Freq: Once | INTRAVENOUS | Status: AC
  Administered 2013-07-25: 40 mg via INTRAVENOUS
  Filled 2013-07-25: qty 40

## 2013-07-25 MED ORDER — ONDANSETRON HCL 4 MG/2ML IJ SOLN
4.0000 mg | Freq: Once | INTRAMUSCULAR | Status: AC
  Administered 2013-07-25: 4 mg via INTRAVENOUS
  Filled 2013-07-25: qty 2

## 2013-07-25 MED ORDER — SODIUM CHLORIDE 0.9 % IV SOLN
INTRAVENOUS | Status: DC
  Administered 2013-07-25: 14:00:00 via INTRAVENOUS

## 2013-07-25 MED ORDER — OMEPRAZOLE 20 MG PO CPDR: 20.0000 mg | DELAYED_RELEASE_CAPSULE | Freq: Every day | ORAL | Status: DC

## 2013-07-25 NOTE — ED Provider Notes (Signed)
CSN: [id number]     Arrival date & time 07/25/13  1327 History   First MD Initiated Contact with Patient 07/25/13 1344     Chief Complaint  Patient presents with  . Chest Pain   (Consider location/radiation/quality/duration/timing/severity/associated sxs/prior Treatment) Patient is a 57 y.o. female presenting with chest pain. The history is provided by the spouse and the patient.  Chest Pain  patient here complaining of right-sided chest pain that began this morning acutely and characterized as sharp and radiating to her right neck. Pain is characterized as pleuritic and without fever or cough.: Denies any leg pain or swelling. No recent URI symptoms. Does have a history of metastatic colon CA and also has a Port-A-Cath on the right side of her chest. Used her home medications without relief. No prior history of pulmonary embolism.  Past Medical History  Diagnosis Date  . History of migraine headaches   . History of mononucleosis   . History of recurrent miscarriages, not currently pregnant   . Microcytic anemia   . Colon cancer 12/2010    metastatic  adenocarcinoma w/liver masses  . PONV (postoperative nausea and vomiting)   . Chemotherapy adverse reaction 04-07-13    last tx. 1'14- did caused platelet to drop 120's, now climbing up   Past Surgical History  Procedure Laterality Date  . Shoulder surgery  2009    Left shoulder  . Tonsillectomy      as child  . Portacath placement  12/20/2010    remains on right chest  . Ercp N/A 04/12/2013    Procedure: ENDOSCOPIC RETROGRADE CHOLANGIOPANCREATOGRAPHY (ERCP);  Surgeon: Petra Kuba, MD;  Location: [facility];  Service: Endoscopy;  Laterality: N/A;   No family history on file. History  Substance Use Topics  . Smoking status: Never Smoker   . Smokeless tobacco: Never Used  . Alcohol Use: No   OB History   Grav Para Term Preterm Abortions TAB SAB Ect Mult Living                 Review of Systems  Cardiovascular: Positive for  chest pain.  All other systems reviewed and are negative.    Allergies  Compazine  Home Medications   Current Outpatient Rx  Name  Route  Sig  Dispense  Refill  . ALPRAZolam (XANAX) 0.5 MG tablet   Oral   Take 0.5-1 mg by mouth 2 (two) times daily as needed for sleep or anxiety.         Marland Kitchen ibuprofen (ADVIL,MOTRIN) 800 MG tablet   Oral   Take 800 mg by mouth every 8 (eight) hours as needed (For headaches.).          Marland Kitchen LORazepam (ATIVAN) 0.5 MG tablet   Oral   Take 0.5-1 mg by mouth every 8 (eight) hours as needed (For nausea.).         Marland Kitchen ondansetron (ZOFRAN) 8 MG tablet   Oral   Take 1 tablet (8 mg total) by mouth every 12 (twelve) hours as needed for nausea.   20 tablet   1   . oxyCODONE-acetaminophen (PERCOCET/ROXICET) 5-325 MG per tablet   Oral   Take 1 tablet by mouth every 4 (four) hours as needed for pain.         . pantoprazole (PROTONIX) 40 MG tablet   Oral   Take 40 mg by mouth every morning.         . traMADol (ULTRAM) 50 MG tablet   Oral  Take 50 mg by mouth every 6 (six) hours as needed for pain.         . capecitabine (XELODA) 500 MG tablet   Oral   Take 2 tablets (1,000 mg total) by mouth 2 (two) times daily after a meal.   56 tablet   3     Faxed to [facility] [phone]   . SUMAtriptan (IMITREX) 100 MG tablet   Oral   Take 1 tablet (100 mg total) by mouth as needed. 1 tablet by mouth as needed for migraine limit to once a week   10 tablet   11    BP 145/77  Pulse 79  Temp(Src) 98.1 F (36.7 C) (Oral)  Resp 20  SpO2 100% Physical Exam  Nursing note and vitals reviewed. Constitutional: She is oriented to person, place, and time. She appears well-developed and well-nourished.  Non-toxic appearance. No distress.  HENT:  Head: Normocephalic and atraumatic.  Eyes: Conjunctivae, EOM and lids are normal. Pupils are equal, round, and reactive to light.  Neck: Normal range of motion. Neck supple. No tracheal deviation present. No  mass present.  Cardiovascular: Normal rate, regular rhythm and normal heart sounds.  Exam reveals no gallop.   No murmur heard. Pulmonary/Chest: Effort normal and breath sounds normal. No stridor. No respiratory distress. She has no decreased breath sounds. She has no wheezes. She has no rhonchi. She has no rales. She exhibits no bony tenderness and no laceration.    Abdominal: Soft. Normal appearance and bowel sounds are normal. She exhibits no distension. There is no tenderness. There is no rebound and no CVA tenderness.  Musculoskeletal: Normal range of motion. She exhibits no edema and no tenderness.  Neurological: She is alert and oriented to person, place, and time. She has normal strength. No cranial nerve deficit or sensory deficit. GCS eye subscore is 4. GCS verbal subscore is 5. GCS motor subscore is 6.  Skin: Skin is warm and dry. No abrasion and no rash noted.  Psychiatric: She has a normal mood and affect. Her speech is normal and behavior is normal.    ED Course  Procedures (including critical care time) Labs Review Labs Reviewed  CBC  BASIC METABOLIC PANEL   Imaging Review No results found.  MDM  No diagnosis found.  Date: 07/25/2013  Rate: 81  Rhythm: normal sinus rhythm  QRS Axis: normal  Intervals: normal  ST/T Wave abnormalities: normal  Conduction Disutrbances:none  Narrative Interpretation:   Old EKG Reviewed: none available    Chest ct pending, dr. Rhunette Croft to f/u  Toy Baker, MD 07/25/13 1451

## 2013-07-25 NOTE — ED Provider Notes (Signed)
  Physical Exam  BP 131/79  Pulse 84  Temp(Src) 98.1 F (36.7 C) (Oral)  Resp 20  SpO2 97%  Physical Exam  ED Course  Procedures  MDM  Assuming care of patient from Dr. Freida Busman Patient in the ED for pleuritic type chest pain. CT PE is negative.      During my eval, pt was chest pain free. She did have some epigastric type abd pain. TROP and LIPASE neg. Has liver mets, with primary colon CA. Oncology monitoring closely, and there is plan to get a body scan after she is done with her chemo rounds. Return precautions discussed, but given she is tolerating pain quite well, and no peritoneal signs, not getting a CT abd with contrast at this time.    Derwood Kaplan, MD 07/25/13 1946

## 2013-07-25 NOTE — ED Notes (Signed)
Pt reports generalized chest pain that began three hours ago with radiation to her neck and right shoulder. Pt reports the pain as sharp, which worsens with breathing. Pt denies shortness of breath, however reports lightheadedness, nausea, and vomiting. Pt reports having colon cancer that has metastasized to the liver. Pt reports getting her last chemotherapy three weeks ago. Oxygen saturation is 100% on room air.

## 2013-07-26 ENCOUNTER — Ambulatory Visit: Payer: BC Managed Care – PPO | Admitting: Nurse Practitioner

## 2013-07-26 ENCOUNTER — Ambulatory Visit (HOSPITAL_COMMUNITY)
Admission: RE | Admit: 2013-07-26 | Discharge: 2013-07-26 | Disposition: A | Discharge status: Home / Self Care | Payer: BC Managed Care – PPO | Source: Ambulatory Visit | Attending: Oncology | Admitting: Oncology

## 2013-07-26 ENCOUNTER — Other Ambulatory Visit (HOSPITAL_BASED_OUTPATIENT_CLINIC_OR_DEPARTMENT_OTHER): Payer: BC Managed Care – PPO | Admitting: Lab

## 2013-07-26 ENCOUNTER — Encounter (HOSPITAL_COMMUNITY): Payer: Self-pay

## 2013-07-26 ENCOUNTER — Ambulatory Visit (HOSPITAL_BASED_OUTPATIENT_CLINIC_OR_DEPARTMENT_OTHER): Payer: BC Managed Care – PPO

## 2013-07-26 ENCOUNTER — Other Ambulatory Visit: Payer: Self-pay | Admitting: *Deleted

## 2013-07-26 ENCOUNTER — Telehealth: Payer: Self-pay | Admitting: *Deleted

## 2013-07-26 ENCOUNTER — Ambulatory Visit (HOSPITAL_BASED_OUTPATIENT_CLINIC_OR_DEPARTMENT_OTHER): Payer: BC Managed Care – PPO | Admitting: Oncology

## 2013-07-26 VITALS — BP 132/75 | HR 78 | Resp 18

## 2013-07-26 VITALS — BP 118/80 | HR 88 | Temp 97.1°F | Resp 18 | Ht 67.0 in | Wt 148.1 lb

## 2013-07-26 DIAGNOSIS — K831 Obstruction of bile duct: Secondary | ICD-10-CM | POA: Insufficient documentation

## 2013-07-26 DIAGNOSIS — K829 Disease of gallbladder, unspecified: Secondary | ICD-10-CM | POA: Insufficient documentation

## 2013-07-26 DIAGNOSIS — C187 Malignant neoplasm of sigmoid colon: Secondary | ICD-10-CM

## 2013-07-26 DIAGNOSIS — C189 Malignant neoplasm of colon, unspecified: Secondary | ICD-10-CM

## 2013-07-26 DIAGNOSIS — Z79899 Other long term (current) drug therapy: Secondary | ICD-10-CM | POA: Insufficient documentation

## 2013-07-26 DIAGNOSIS — R109 Unspecified abdominal pain: Secondary | ICD-10-CM

## 2013-07-26 DIAGNOSIS — R071 Chest pain on breathing: Secondary | ICD-10-CM

## 2013-07-26 DIAGNOSIS — K838 Other specified diseases of biliary tract: Secondary | ICD-10-CM | POA: Insufficient documentation

## 2013-07-26 DIAGNOSIS — C787 Secondary malignant neoplasm of liver and intrahepatic bile duct: Secondary | ICD-10-CM

## 2013-07-26 DIAGNOSIS — R110 Nausea: Secondary | ICD-10-CM

## 2013-07-26 DIAGNOSIS — R161 Splenomegaly, not elsewhere classified: Secondary | ICD-10-CM | POA: Insufficient documentation

## 2013-07-26 LAB — CBC WITH DIFFERENTIAL/PLATELET
BASO%: 0.5 % (ref 0.0–2.0)
Basophils Absolute: 0 10*3/uL (ref 0.0–0.1)
EOS%: 1.4 % (ref 0.0–7.0)
HGB: 13.9 g/dL (ref 11.6–15.9)
MCH: 30.1 pg (ref 25.1–34.0)
MCHC: 33.7 g/dL (ref 31.5–36.0)
MCV: 89.3 fL (ref 79.5–101.0)
MONO%: 12.5 % (ref 0.0–14.0)
RBC: 4.64 10*6/uL (ref 3.70–5.45)
RDW: 19.9 % — ABNORMAL HIGH (ref 11.2–14.5)
WBC: 5.4 10*3/uL (ref 3.9–10.3)

## 2013-07-26 LAB — COMPREHENSIVE METABOLIC PANEL (CC13)
AST: 89 U/L — ABNORMAL HIGH (ref 5–34)
Albumin: 3.4 g/dL — ABNORMAL LOW (ref 3.5–5.0)
Alkaline Phosphatase: 419 U/L — ABNORMAL HIGH (ref 40–150)
BUN: 10.6 mg/dL (ref 7.0–26.0)
Calcium: 9.8 mg/dL (ref 8.4–10.4)
Chloride: 104 mEq/L (ref 98–109)
Potassium: 4 mEq/L (ref 3.5–5.1)
Total Bilirubin: 1.6 mg/dL — ABNORMAL HIGH (ref 0.20–1.20)

## 2013-07-26 MED ORDER — KETOROLAC TROMETHAMINE 30 MG/ML IJ SOLN
30.0000 mg | Freq: Once | INTRAMUSCULAR | Status: AC
  Administered 2013-07-26: 30 mg via INTRAVENOUS

## 2013-07-26 MED ORDER — IOHEXOL 300 MG/ML  SOLN
100.0000 mL | Freq: Once | INTRAMUSCULAR | Status: AC | PRN
  Administered 2013-07-26: 80 mL via INTRAVENOUS

## 2013-07-26 MED ORDER — KETOROLAC TROMETHAMINE 10 MG PO TABS: 10.0000 mg | ORAL_TABLET | Freq: Four times a day (QID) | ORAL | Status: DC | PRN

## 2013-07-26 MED ORDER — IOHEXOL 300 MG/ML  SOLN
50.0000 mL | Freq: Once | INTRAMUSCULAR | Status: AC | PRN
  Administered 2013-07-26: 50 mL via ORAL

## 2013-07-26 MED ORDER — MORPHINE SULFATE 4 MG/ML IJ SOLN
INTRAMUSCULAR | Status: AC
  Filled 2013-07-26: qty 1

## 2013-07-26 MED ORDER — MORPHINE SULFATE 4 MG/ML IJ SOLN
2.0000 mg | INTRAMUSCULAR | Status: DC | PRN
  Administered 2013-07-26: 2 mg via INTRAVENOUS

## 2013-07-26 MED ORDER — ONDANSETRON 8 MG/50ML IVPB (CHCC)
8.0000 mg | Freq: Once | INTRAVENOUS | Status: AC
  Administered 2013-07-26: 8 mg via INTRAVENOUS

## 2013-07-26 MED ORDER — HEPARIN SOD (PORK) LOCK FLUSH 100 UNIT/ML IV SOLN
500.0000 [IU] | Freq: Once | INTRAVENOUS | Status: AC
  Administered 2013-07-26: 500 [IU] via INTRAVENOUS
  Filled 2013-07-26: qty 5

## 2013-07-26 MED ORDER — SODIUM CHLORIDE 0.9 % IV SOLN
INTRAVENOUS | Status: DC
  Administered 2013-07-26: 12:00:00 via INTRAVENOUS

## 2013-07-26 MED ORDER — ONDANSETRON 8 MG/NS 50 ML IVPB
INTRAVENOUS | Status: AC
  Filled 2013-07-26: qty 8

## 2013-07-26 MED ORDER — KETOROLAC TROMETHAMINE 30 MG/ML IJ SOLN
INTRAMUSCULAR | Status: AC
  Filled 2013-07-26: qty 1

## 2013-07-26 MED ORDER — SODIUM CHLORIDE 0.9 % IJ SOLN
10.0000 mL | Freq: Once | INTRAMUSCULAR | Status: AC
  Administered 2013-07-26: 10 mL via INTRAVENOUS
  Filled 2013-07-26: qty 10

## 2013-07-26 NOTE — Patient Instructions (Addendum)
[facility] Discharge Instructions for Patients Receiving Chemotherapy  Today you received the following: Normal Saline IV fluids, Morphine, Zofran, Toradol   To help prevent nausea and vomiting after your treatment, we encourage you to take your nausea medication as prescribed.   If you develop nausea and vomiting that is not controlled by your nausea medication, call the clinic.   BELOW ARE SYMPTOMS THAT SHOULD BE REPORTED IMMEDIATELY:  *FEVER GREATER THAN 100.5 F  *CHILLS WITH OR WITHOUT FEVER  NAUSEA AND VOMITING THAT IS NOT CONTROLLED WITH YOUR NAUSEA MEDICATION  *UNUSUAL SHORTNESS OF BREATH  *UNUSUAL BRUISING OR BLEEDING  TENDERNESS IN MOUTH AND THROAT WITH OR WITHOUT PRESENCE OF ULCERS  *URINARY PROBLEMS  *BOWEL PROBLEMS  UNUSUAL RASH Items with * indicate a potential emergency and should be followed up as soon as possible.  Feel free to call the clinic you have any questions or concerns. The clinic phone number is [phone].

## 2013-07-26 NOTE — Telephone Encounter (Signed)
Called pt with appt changes. Will need to move office visit to 9/24. Husband reports pt really needs to see MD today. Went to ED yesterday and needs to discuss things. Pt given work in appt at [employer].

## 2013-07-26 NOTE — Telephone Encounter (Signed)
Gave pt appt for ML tomorrow

## 2013-07-26 NOTE — Progress Notes (Signed)
[facility]    OFFICE PROGRESS NOTE   INTERVAL HISTORY:   She returns for scheduled followup of colon cancer.  She completed another cycle of CAPOX beginning on 07/05/2013. No mouth sores, diarrhea, or hand/foot pain.  Ms. Hollifield developed acute mid upper abdominal pain/low anterior chest pain yesterday. The pain is pleuritic and radiates to the upper chest and neck. The pain was severe and she presented to the emergency room on 07/25/2013. A CT the chest revealed no pulmonary embolism or other acute finding. She was given Dilaudid in the emergency room and reports this caused nausea and presyncope. Oxycodone partially relieve the pain. She has been nauseated yesterday and today.  No fever, bleeding, dysuria, or difficulty with bowel function. She is not sure whether she has associated dyspnea, but it is difficult to take a deep breath secondary to pain. She reports difficulty sleeping secondary to pain.  Objective:  Vital signs in last 24 hours:  Blood pressure 118/80, pulse 88, temperature 97.1 F (36.2 C), temperature source Oral, resp. rate 18, height 5\' 7"  (1.702 m), weight 148 lb 1.6 oz (67.178 kg).    HEENT: No thrush or ulcers,? Mild scleral icterus Resp: Lungs clear bilaterally Cardio: Regular rate and rhythm GI: The liver edge is palpable in the mid upper abdomen in the subxiphoid region. There is associated tenderness. Diminished bowel sounds, the abdomen is soft. No apparent ascites. Vascular: No leg edema Neuro: Alert and oriented  Skin: Palms and soles without erythema   Portacath/PICC-without erythema  Lab Results:  Lab Results  Component Value Date   WBC 5.4 07/26/2013   HGB 13.9 07/26/2013   HCT 41.4 07/26/2013   MCV 89.3 07/26/2013   PLT 82* 07/26/2013   ANC 4.3 07/25/2013-lipase 28, troponin less than 0.3, bilirubin 1.6   Medications: I have reviewed the patient's current medications.  Assessment/Plan: 1.Metastatic colon cancer with  multiple liver metastases noted on an MRI of the abdomen 12/02/2010 and on a CT 12/13/2010. A transverse colon mass was noted on the CT from 02/11/2011 and confirmed on a colonoscopy 12/17/2010. She began treatment with FOLFIRI/Avastin 12/31/2010. A restaging CT 06/20/2011 confirmed further improvement in the metastatic liver lesions and no evidence for progressive metastatic disease. A restaging CT 08/01/2011 showed stable liver lesions and no new lesions. Restaging CT 09/12/2011 showed stable liver lesions and no new lesions. A restaging CT 10/27/2011 showed stable liver lesions and no new lesions. Restaging CT 12/05/2011 showed mild decrease in size of hepatic metastases and no evidence of disease progression. Restaging CT 01/26/2012 showed stable hepatic metastases and no new lesions. Restaging CT may 5/8/ 2013 revealed a decreased size of hepatic metastases and no new lesions. Restaging CT 04/23/2012 revealed no change in the measurable "target" lesions, a slight increase in the size of other lesions, and no new lesions. Restaging CT 06/04/2012 showed multiple hepatic metastases were grossly unchanged. Restaging CT 07/19/2012 with stable disease in the liver. Infusional 5-fluorouracil continued on a 2 week schedule. Treatment was held after the 08/03/2012 cycle due to thrombocytopenia. Restaging CT evaluation 08/30/2012 with mild increase in size of large hepatic metastases. Treatment was resumed with 5 fluorouracil and Avastin on 08/31/2012. Restaging CT 10/14/2012-stable hepatic metastases. Treatment continued with 5 fluorouracil and Avastin. Restaging CT 12/01/2012 revealed mild progression in the size of multiple hepatic lesions. Treatment was placed on hold. CEA 32.1 on 01/06/2013 and 71.9 on 03/11/2013. Restaging CT abdomen/pelvis 03/31/2013 with interval progression of liver metastases. She began cycle 1  CAPOX on 04/15/2013. The Xeloda was prematurely discontinued on 04/25/2013 due to redness and pain  over the feet. She completed cycle 2 beginning 05/24/2013 and cycle 3 beginning on 07/05/2013.  2. Abdominal pain-? Secondary to liver metastases, metastatic adenopathy, or the primary colon tumor. Less likely peptic ulcer disease  3. Anorexia, early satiety, and weight loss secondary to metastatic colon cancer-resolved.  4. Microcytic anemia. Resolved.  5. History of migraine headaches.  6. Status post uterine fibroid surgery.  7. Port-A-Cath placement 12/20/2010.  8. Delayed nausea following cycle 1 of FOLFIRI/Avastin. Improved with the addition of Aloxi and prophylactic Decadron beginning with cycle #2. The prophylactic Decadron was tapered. We increased the day 1 Decadron back to 20 mg.  9. Neutropenia secondary to chemotherapy. Cycle #4 was held on 02/11/2011. She subsequently received Neulasta support.  10. History of intermittent fever with no apparent source for infection, likely "tumor fever" or fever related to chemotherapy. No recent fever.  11. History of mucositis secondary to 5-FU, initially grade 2. Irinotecan and 5-FU were dose reduced per study guidelines.  12. History of abdominal cramping. ? Related to irinotecan, 5-FU, or potentially the colon tumor. No recent abdominal cramping.  13. Thrombocytopenia secondary to chemotherapy (while being treated with irinotecan and Avastin). Treatment was held for thrombocytopenia per study guidelines. The thrombocytopenia may be related to Avastin, or chronic liver disease from chemotherapy. The CT on 04/23/2012 confirmed mild splenomegaly. The CT on 06/04/2012 again showed splenomegaly. The thrombocytopenia persisted despite being off of irinotecan and Avastin for several months. The thrombocytopenia improved. She developed thrombocytopenia following cycle 1 and cycle 2 CAPOX.  14. Acne-like rash on the face. Question related to steroids. Resolved.  15. Arthralgias-most likely unrelated to the colon cancer diagnosis and chemotherapy. She has  been evaluated by orthopedics. The etiology of the arthralgias is unclear.  16. Persistent discomfort at the right shoulder. Negative CT of the right upper extremity on 07/19/2012. MRI on 08/06/2012 with a partial thickness bursal surface tear of the supraspinatus tendon, moderate rotator cuff tendinopathy/tendinosis. She is now followed by orthopedics. The pain is stable. The range of motion at the right shoulder has improved  17.? Swelling at the right upper extremity and chest wall April 2014-negative right upper extremity Doppler.  18. Delayed nausea following cycle 1 CAPOX. Emend and Aloxi were added with cycle 2.  19. Mucositis and hand-foot syndrome secondary to Xeloda. The Xeloda was dose reduced to 1000 mg twice daily for 14 days with cycle 2.  20. Thrombocytopenia following cycle 1 CAPOX. Cycle 2 and cycle 3 were delayed due to thrombocytopenia. The oxaliplatin was further dose reduced with cycle 3  21.New onset pruritus 04/04/2013-hyperbilirubinemia and an elevated alkaline phosphatase noted on labs 04/06/2013, the CT 03/31/2013 revealed peripheral biliary ductal dilatation in the medial segment of the left hepatic lobe,no options for intervention after evaluation by Dr. Ewing Schlein. The hyperbilirubinemia initially resolved and is now mildly elevated 22. Acute severe upper abdominal pain, pleuritic, with radiation to the chest/shoulders beginning 07/25/2013-etiology unclear   Disposition:  Ms. Zeiter presents for a scheduled followup visit and chemotherapy. She developed severe upper abdominal pain beginning yesterday. The pain appears to localize to the subxiphoid region where there is palpable fullness. She is very uncomfortable today. She will be referred to the chemotherapy treatment room to receive intravenous fluids, anti-emetics and narcotic analgesics. We will schedule an urgent CT of the abdomen to further evaluate the pain. The differential diagnosis includes pain related to a hepatic  metastasis (? Hemorrhage), a biliary process, or other sources of abdominal pain.  She will be scheduled for an office visit on 07/26/2013.  Addendum: She was treated with morphine sulfate in the chemotherapy room. This caused nausea, a presyncope feeling, and did not relieve the pain. She was subsequently treated with Toradol with good relief of the pain. She will be prescribed Toradol for home use. We will followup on the CT later today.  Approximately 45 minutes were spent with the patient today. The majority of the time was used for counseling and coordination of care.   Thornton Papas, MD  07/26/2013  5:25 PM

## 2013-07-26 NOTE — Progress Notes (Signed)
Dr. Truett Perna informed that patient now rating chest pain 9/10; previously 5/10 but worse after 2 mg morphine. Patient has increased work of breathing, O2 sat 100% on RA. Toradol infusing now, will monitor and reassess pain and keep MD informed of patient status. Awaiting CT scan. Clayborn Heron, RN  Dr. Truett Perna assessed patient's status and pain while in infusion room. Patient has completed IVF and has received pain and nausea medication see MAR. Patient's chest pain on inspiration is 4/10, down from 9/10 while receiving IVF. She is ambulating independently. She states she feels much better and is "feeling more like my self".  Patient's port has been accessed with power needle for CT scan, flushed and hep locked. Patient discharged to CT scan, refusing wheelchair and states she feels fine walking. VSS, denies lightheadedness or dizziness. Clayborn Heron, RN

## 2013-07-27 ENCOUNTER — Telehealth: Payer: Self-pay | Admitting: Oncology

## 2013-07-27 ENCOUNTER — Ambulatory Visit (HOSPITAL_BASED_OUTPATIENT_CLINIC_OR_DEPARTMENT_OTHER): Payer: BC Managed Care – PPO | Admitting: Nurse Practitioner

## 2013-07-27 VITALS — BP 134/85 | HR 73 | Temp 97.4°F | Resp 18 | Ht 67.0 in | Wt 151.0 lb

## 2013-07-27 DIAGNOSIS — C787 Secondary malignant neoplasm of liver and intrahepatic bile duct: Secondary | ICD-10-CM

## 2013-07-27 DIAGNOSIS — C187 Malignant neoplasm of sigmoid colon: Secondary | ICD-10-CM

## 2013-07-27 DIAGNOSIS — C189 Malignant neoplasm of colon, unspecified: Secondary | ICD-10-CM

## 2013-07-27 DIAGNOSIS — R52 Pain, unspecified: Secondary | ICD-10-CM

## 2013-07-27 DIAGNOSIS — R109 Unspecified abdominal pain: Secondary | ICD-10-CM

## 2013-07-27 MED ORDER — OXYCODONE-ACETAMINOPHEN 5-325 MG PO TABS: 1.0000 | ORAL_TABLET | Freq: Four times a day (QID) | ORAL | Status: DC | PRN

## 2013-07-27 NOTE — Progress Notes (Addendum)
OFFICE PROGRESS NOTE  Interval history:  Natalie Mccarthy is a 57 year old woman with metastatic colon cancer. She completed cycle 3 CAPOX beginning 07/05/2013. She was seen by Dr. Truett Perna on 07/26/2013 for evaluation of abdominal and chest pain. She was given Toradol in the office with good relief.  CT abdomen/pelvis on 07/26/2013 showed interval slight progression of metastatic disease to the liver; no new hepatic metastases; slight progression of intrahepatic biliary ductal dilatation; interval marked increase in the size of the spleen; interval increase in the size of a portacaval lymph node and gastrohepatic ligament lymph nodes in the upper abdomen; contracted thickwalled gallbladder possibly containing sludge.  She presents today for followup.  The abdominal and chest pain are better with Toradol. She did not take any Toradol during the night and workup this morning with recurrent severe pain. She took a Toradol tablet with improvement in the pain. The pain continues to be located in the upper abdomen with radiation into the chest when she lays flat or takes a deep breath. She denies shortness of breath. No fever. She denies bleeding. She is intermittently nauseated and has had a few episodes of vomiting.   Objective: Blood pressure 134/85, pulse 73, temperature 97.4 F (36.3 C), temperature source Oral, resp. rate 18, height 5\' 7"  (1.702 m), weight 151 lb (68.493 kg).  Oropharynx is without thrush or ulceration. Lungs are clear. Regular cardiac rhythm. Port-A-Cath site is without erythema. Abdomen is soft. The liver is palpable in the medial right upper quadrant with marked associated tenderness. The abdomen is nondistended. Bowel sounds are active. No leg edema. Calves are soft and nontender. She is alert and oriented. Moving all extremities. Follows commands.  Lab Results: Lab Results  Component Value Date   WBC 5.4 07/26/2013   HGB 13.9 07/26/2013   HCT 41.4 07/26/2013   MCV 89.3  07/26/2013   PLT 82* 07/26/2013    Chemistry:    Chemistry      Component Value Date/Time   NA 139 07/26/2013 1015   NA 136 07/25/2013 1345   NA 143 01/27/2012 1354   K 4.0 07/26/2013 1015   K 4.3 07/25/2013 1345   K 3.8 01/27/2012 1354   CL 99 07/25/2013 1345   CL 101 04/15/2013 1230   CL 97* 01/27/2012 1354   CO2 24 07/26/2013 1015   CO2 27 07/25/2013 1345   CO2 29 01/27/2012 1354   BUN 10.6 07/26/2013 1015   BUN 12 07/25/2013 1345   BUN 11 01/27/2012 1354   CREATININE 0.8 07/26/2013 1015   CREATININE 0.71 07/25/2013 1345   CREATININE 0.9 01/27/2012 1354      Component Value Date/Time   CALCIUM 9.8 07/26/2013 1015   CALCIUM 9.9 07/25/2013 1345   CALCIUM 8.9 01/27/2012 1354   ALKPHOS 419* 07/26/2013 1015   ALKPHOS 500* 04/13/2013 0600   ALKPHOS 198* 01/27/2012 1354   AST 89* 07/26/2013 1015   AST 214* 04/13/2013 0600   AST 34 01/27/2012 1354   ALT 82* 07/26/2013 1015   ALT 220* 04/13/2013 0600   ALT 41 01/27/2012 1354   BILITOT 1.60* 07/26/2013 1015   BILITOT 3.9* 04/13/2013 0600   BILITOT 0.50 01/27/2012 1354       Studies/Results: Dg Chest 2 View  07/25/2013   CLINICAL DATA:  Chest pain  EXAM: CHEST  2 VIEW  COMPARISON:  None.  FINDINGS: AP and lateral views of the chest obtained. The lungs are clear without focal infiltrate, edema, pneumothorax or pleural effusion. The cardiopericardial silhouette  is within normal limits for size. Right Port-A-Cath tip overlies the mid SVC. Imaged bony structures of the thorax are intact. Telemetry leads overlie the chest.  IMPRESSION: No active cardiopulmonary disease.   Electronically Signed   By: Kennith Center M.D.   On: 07/25/2013 14:51   Ct Angio Chest Pe W/cm &/or Wo Cm  07/25/2013   CLINICAL DATA:  58 year old female chest pain radiating to the neck and shoulder with nausea and vomiting. History of metastatic colon cancer with chemotherapy ongoing.  EXAM: CT ANGIOGRAPHY CHEST WITH CONTRAST  TECHNIQUE: Multidetector CT imaging of the chest was performed  using the standard protocol during bolus administration of intravenous contrast. Multiplanar CT image reconstructions including MIPs were obtained to evaluate the vascular anatomy.  CONTRAST:  OMNIPAQUE IOHEXOL 350 MG/ML SOLN  COMPARISON:  CT Abdomen and Pelvis 03/31/2013.  FINDINGS: Good contrast bolus timing in the pulmonary arterial tree.  No focal filling defect identified in the pulmonary arterial tree to suggest the presence of acute pulmonary embolism.  Major airways are patent. Mild dependent pulmonary atelectasis. Right chest porta cath. No pericardial or pleural effusion. No mediastinal lymphadenopathy. No axillary lymphadenopathy.  No acute osseous abnormality identified.  Abnormal appearance of the liver, with heterogeneous enhancement and areas of dystrophic calcification suspected. Overall, the visible liver appears less abnormal than 03/31/2013.  Review of the MIP images confirms the above findings.  IMPRESSION: 1. No evidence of acute pulmonary embolus.  2. No acute or metastatic findings in the chest.  3. Metastatic disease to the liver, incompletely visible the may have mildly regressed since 03/31/2013.   Electronically Signed   By: Augusto Gamble M.D.   On: 07/25/2013 16:20   Ct Abdomen Pelvis W Contrast  07/26/2013   CLINICAL DATA:  Current history of metastatic colon cancer for which the patient is undergoing chemotherapy. Patient presented for chemotherapy today but had severe upper abdominal pain.  EXAM: CT ABDOMEN AND PELVIS WITH CONTRAST  TECHNIQUE: Multidetector CT imaging of the abdomen and pelvis was performed using the standard protocol following bolus administration of intravenous contrast.  CONTRAST:  80mL OMNIPAQUE IOHEXOL 300 MG/ML IV. Oral contrast was also administered.  COMPARISON:  03/31/2013, 12/01/2012, 10/14/2012, 08/30/2012, 12/05/2011.  FINDINGS: Extensive metastatic disease involving both lobes of the liver, with calcification of several of the metastases likely due to  prior treatment. An index lesion in the anterior segment right lobe near the dome with calcification in its center measures approximately 6.3 x 7.3 cm (series 2, image 12), previously approximately 5.0 x 6.7 cm at the same level on the most recent prior CT. Metastases in the central portion of the liver with moderate biliary ductal dilation in the left lobe and in the right lobe (predominantly anterior segment), slightly increased since the prior examination. No discrete new hepatic metastases.  Contracted, thick-walled gallbladder, likely containing echogenic sludge; no calcified gallstones. Moderate to marked splenomegaly, the spleen measuring approximately 14.2 x 5.7 x 19.7 cm, yielding a volume of approximately 797 cc, significantly increased in size since the 03/31/2013 examination where it measured 12.5 x 4.3 x 15.6 cm; no focal splenic parenchymal abnormality. Normal appearing pancreas, adrenal glands, and kidneys. No visible aortoiliofemoral atherosclerosis. Patent visceral arteries.  Enlarged portacaval lymph node measuring approximately 1.9 x 2.3 cm (image 26), previously 1.0 x 1.4 cm. Mildly enlarged gastrohepatic ligament lymph nodes, increased in size since the prior examination, the largest approximating 1.0 x 1.4 cm (image 19). No significant lymphadenopathy elsewhere in the abdomen or pelvis.  Normal appearing stomach and small bowel. With the exception of the cecum common the entire colon is relatively decompressed and unremarkable. Upper normal caliber appendix in the right mid abdomen, unchanged in appearance dating back to the 12/05/2011 examination; no evidence of acute appendicitis.  Urinary bladder decompressed and unremarkable. Pelvic phleboliths. Normal-appearing uterus and ovaries.  Bone window images unremarkable. Visualized lung bases clear. Heart size normal.  IMPRESSION: 1. Interval slight progression of metastatic disease to the liver since the most recent prior examination 03/31/2013.  An index right lobe liver lesion as measured above. No new hepatic metastases. 2. Slight progression of intrahepatic biliary ductal dilation due to obstruction by a central metastases in both lobes of the liver. 3. Interval marked increase in size of the spleen, measurements given above. No focal splenic parenchymal abnormalities. Is the abdominal pain in the left upper quadrant? 4. Interval increase in size of a portacaval lymph node and gastrohepatic ligament lymph nodes in the upper abdomen. Measurements are given above. 5. Contracted, thick-walled gallbladder which may contain sludge. No calcified gallstones and no CT evidence of acute cholecystitis.   Electronically Signed   By: Hulan Saas   On: 07/26/2013 18:17    Medications: I have reviewed the patient's current medications.  Assessment/Plan:  1.Metastatic colon cancer with multiple liver metastases noted on an MRI of the abdomen 12/02/2010 and on a CT 12/13/2010. A transverse colon mass was noted on the CT from 02/11/2011 and confirmed on a colonoscopy 12/17/2010. She began treatment with FOLFIRI/Avastin 12/31/2010. A restaging CT 06/20/2011 confirmed further improvement in the metastatic liver lesions and no evidence for progressive metastatic disease. A restaging CT 08/01/2011 showed stable liver lesions and no new lesions. Restaging CT 09/12/2011 showed stable liver lesions and no new lesions. A restaging CT 10/27/2011 showed stable liver lesions and no new lesions. Restaging CT 12/05/2011 showed mild decrease in size of hepatic metastases and no evidence of disease progression. Restaging CT 01/26/2012 showed stable hepatic metastases and no new lesions. Restaging CT may 5/8/ 2013 revealed a decreased size of hepatic metastases and no new lesions. Restaging CT 04/23/2012 revealed no change in the measurable "target" lesions, a slight increase in the size of other lesions, and no new lesions. Restaging CT 06/04/2012 showed multiple hepatic  metastases were grossly unchanged. Restaging CT 07/19/2012 with stable disease in the liver. Infusional 5-fluorouracil continued on a 2 week schedule. Treatment was held after the 08/03/2012 cycle due to thrombocytopenia. Restaging CT evaluation 08/30/2012 with mild increase in size of large hepatic metastases. Treatment was resumed with 5 fluorouracil and Avastin on 08/31/2012. Restaging CT 10/14/2012-stable hepatic metastases. Treatment continued with 5 fluorouracil and Avastin. Restaging CT 12/01/2012 revealed mild progression in the size of multiple hepatic lesions. Treatment was placed on hold. CEA 32.1 on 01/06/2013 and 71.9 on 03/11/2013. Restaging CT abdomen/pelvis 03/31/2013 with interval progression of liver metastases. She began cycle 1 CAPOX on 04/15/2013. The Xeloda was prematurely discontinued on 04/25/2013 due to redness and pain over the feet. She completed cycle 2 beginning 05/24/2013 and cycle 3 beginning on 07/05/2013.  2. Abdominal pain-? Secondary to liver metastases, metastatic adenopathy, or the primary colon tumor. Less likely peptic ulcer disease.  3. Anorexia, early satiety, and weight loss secondary to metastatic colon cancer-resolved.  4. Microcytic anemia. Resolved.  5. History of migraine headaches.  6. Status post uterine fibroid surgery.  7. Port-A-Cath placement 12/20/2010.  8. Delayed nausea following cycle 1 of FOLFIRI/Avastin. Improved with the addition of Aloxi and prophylactic Decadron  beginning with cycle #2. The prophylactic Decadron was tapered. We increased the day 1 Decadron back to 20 mg.  9. Neutropenia secondary to chemotherapy. Cycle #4 was held on 02/11/2011. She subsequently received Neulasta support.  10. History of intermittent fever with no apparent source for infection, likely "tumor fever" or fever related to chemotherapy. No recent fever.  11. History of mucositis secondary to 5-FU, initially grade 2. Irinotecan and 5-FU were dose reduced per study  guidelines.  12. History of abdominal cramping. ? Related to irinotecan, 5-FU, or potentially the colon tumor. No recent abdominal cramping.  13. Thrombocytopenia secondary to chemotherapy (while being treated with irinotecan and Avastin). Treatment was held for thrombocytopenia per study guidelines. The thrombocytopenia may be related to Avastin, or chronic liver disease from chemotherapy. The CT on 04/23/2012 confirmed mild splenomegaly. The CT on 06/04/2012 again showed splenomegaly. The thrombocytopenia persisted despite being off of irinotecan and Avastin for several months. The thrombocytopenia improved. She developed thrombocytopenia following cycle 1 and cycle 2 CAPOX.  14. Acne-like rash on the face. Question related to steroids. Resolved.  15. Arthralgias-most likely unrelated to the colon cancer diagnosis and chemotherapy. She has been evaluated by orthopedics. The etiology of the arthralgias is unclear.  16. Persistent discomfort at the right shoulder. Negative CT of the right upper extremity on 07/19/2012. MRI on 08/06/2012 with a partial thickness bursal surface tear of the supraspinatus tendon, moderate rotator cuff tendinopathy/tendinosis. She is now followed by orthopedics. The pain is stable. The range of motion at the right shoulder has improved  17.? Swelling at the right upper extremity and chest wall April 2014-negative right upper extremity Doppler.  18. Delayed nausea following cycle 1 CAPOX. Emend and Aloxi were added with cycle 2.  19. Mucositis and hand-foot syndrome secondary to Xeloda. The Xeloda was dose reduced to 1000 mg twice daily for 14 days with cycle 2.  20. Thrombocytopenia following cycle 1 CAPOX. Cycle 2 and cycle 3 were delayed due to thrombocytopenia. The oxaliplatin was further dose reduced with cycle 3  21.New onset pruritus 04/04/2013-hyperbilirubinemia and an elevated alkaline phosphatase noted on labs 04/06/2013, the CT 03/31/2013 revealed peripheral biliary  ductal dilatation in the medial segment of the left hepatic lobe,no options for intervention after evaluation by Dr. Ewing Schlein. The hyperbilirubinemia initially resolved and was mildly elevated on 07/26/2013. 22. Acute severe upper abdominal pain, pleuritic, with radiation to the chest/shoulders beginning 07/25/2013-etiology unclear. Chest CT 07/25/2013 showed no evidence of an acute pulmonary embolus and no acute or metastatic findings in the chest. CT abdomen/pelvis on 07/26/2013 showed interval slight progression of metastatic disease to the liver; no new hepatic metastases; slight progression of intrahepatic biliary ductal dilatation; interval marked increase in the size of the spleen; interval increase in the size of a portacaval lymph node and gastrohepatic ligament lymph nodes; contracted thickwalled gallbladder possibly containing sludge.  Disposition-Dr. Truett Perna reviewed the recent CT scan with a radiologist. There is no obvious etiology for the pain. Dr. Truett Perna suspects the pain is related to progressive metastatic disease involving the liver possibly causing irritation of the diaphragm.  Dr. Truett Perna discussed the scan findings with Ms. Sarin and her husband.  Toradol has been effective for the pain and she will continue taking it as needed for the next 4 days. She understands Toradol can only be taken for a maximum of 5 days. We gave her a new prescription for Percocet 5/325 one to 2 tablets every 6 hours as needed. She will try ibuprofen as needed  as well.  Dr. Truett Perna recommends discontinuation of CAPOX chemotherapy she appears to have disease progression. K-ras mutation was not detected (wild-type) off the needle core biopsy of the liver on 12/10/2010. We will request extended K-ras testing to determine if she is a candidate for anti-EGFR therapy. Discussion regarding possible treatment with Panitumumab was initiated. We reviewed potential toxicities including an allergic reaction, skin  rash, diarrhea.  She will return for a followup visit on 08/04/2013 for additional discussion once we have the extended K-ras testing available. She will continue pain medication as outlined above in the interim and will contact the office with any problems.  Patient seen with Dr. Truett Perna.   Lonna Cobb ANP/GNP-BC  This was a shared visit with Lonna Cobb. I discussed the CT findings with Ms. Lichter and her husband. There is clinical and x-ray evidence of disease progression. I reviewed the CT of the chest and abdomen in radiology. The pain is most likely related to progressive metastatic disease in the liver.  We decided to discontinue CAPOX. Her tumor will be submitted for extended K-ras testing. If the tumor is K-ras wild-type the plan is to proceed with irinotecan/panitumumab.  Toradol has helped the pain. She will continue Toradol for the next few days and then try Percocet. Ms. Salah will return for an office visit on 08/04/2013.  Mancel Bale, M.D.

## 2013-07-27 NOTE — Telephone Encounter (Signed)
gv andprinted appt sched and avs for pt for 10.2.14

## 2013-07-28 ENCOUNTER — Telehealth: Payer: Self-pay | Admitting: *Deleted

## 2013-07-28 NOTE — Telephone Encounter (Signed)
Request for extended KRAS testing submitted to and confirmed with [facility].  Spoke with Tammy.

## 2013-08-02 ENCOUNTER — Telehealth: Payer: Self-pay | Admitting: *Deleted

## 2013-08-02 NOTE — Telephone Encounter (Signed)
Received notification from [facility] that the sample (Accession #: [id number] ) does not meet minimum test requirements for size or tumor content to perform the extended KRAS testing. Per Dr. Frederica Kuster, will send out tissue on Accession #: [id number].  Dr. Truett Perna aware.

## 2013-08-03 ENCOUNTER — Telehealth: Payer: Self-pay | Admitting: *Deleted

## 2013-08-03 NOTE — Telephone Encounter (Signed)
Spoke with the patient per request of Dr. Truett Perna.  Informed her that there was not enough tissue available to perform the extended KRAS testing.  Patient has an appointment on 08/04/13 with Dr. Truett Perna and would like to discuss biopsy options at that time, including re-drawing her bloodwork and checking her platelets.  MD informed.

## 2013-08-04 ENCOUNTER — Telehealth: Payer: Self-pay | Admitting: Oncology

## 2013-08-04 ENCOUNTER — Ambulatory Visit (HOSPITAL_BASED_OUTPATIENT_CLINIC_OR_DEPARTMENT_OTHER): Payer: BC Managed Care – PPO | Admitting: Lab

## 2013-08-04 ENCOUNTER — Ambulatory Visit (HOSPITAL_BASED_OUTPATIENT_CLINIC_OR_DEPARTMENT_OTHER): Payer: BC Managed Care – PPO | Admitting: Nurse Practitioner

## 2013-08-04 VITALS — BP 136/82 | HR 71 | Temp 97.9°F | Resp 18 | Ht 67.0 in | Wt 151.8 lb

## 2013-08-04 DIAGNOSIS — C189 Malignant neoplasm of colon, unspecified: Secondary | ICD-10-CM

## 2013-08-04 DIAGNOSIS — C787 Secondary malignant neoplasm of liver and intrahepatic bile duct: Secondary | ICD-10-CM

## 2013-08-04 DIAGNOSIS — G893 Neoplasm related pain (acute) (chronic): Secondary | ICD-10-CM

## 2013-08-04 DIAGNOSIS — C187 Malignant neoplasm of sigmoid colon: Secondary | ICD-10-CM

## 2013-08-04 DIAGNOSIS — C184 Malignant neoplasm of transverse colon: Secondary | ICD-10-CM

## 2013-08-04 LAB — CBC WITH DIFFERENTIAL/PLATELET
BASO%: 0.8 % (ref 0.0–2.0)
Basophils Absolute: 0 10*3/uL (ref 0.0–0.1)
EOS%: 2.6 % (ref 0.0–7.0)
HCT: 38.2 % (ref 34.8–46.6)
LYMPH%: 10.6 % — ABNORMAL LOW (ref 14.0–49.7)
MCH: 29.7 pg (ref 25.1–34.0)
MCHC: 33 g/dL (ref 31.5–36.0)
MCV: 90 fL (ref 79.5–101.0)
MONO%: 15.6 % — ABNORMAL HIGH (ref 0.0–14.0)
NEUT%: 70.4 % (ref 38.4–76.8)
RDW: 18.7 % — ABNORMAL HIGH (ref 11.2–14.5)
lymph#: 0.4 10*3/uL — ABNORMAL LOW (ref 0.9–3.3)

## 2013-08-04 NOTE — Progress Notes (Signed)
OFFICE PROGRESS NOTE  Interval history:  Natalie Mccarthy is a 57 year old woman with metastatic colon cancer. She completed cycle 3 CAPOX beginning 07/05/2013. She is seen today for scheduled followup.  She reports overall good pain control. She is alternating Percocet and Motrin. She has had a single episode of nausea/vomiting since her last visit. Bowels moving regularly. No constipation. No skin rash. She denies shortness of breath and cough. She denies any unusual headaches or vision change.   Objective: Blood pressure 136/82, pulse 71, temperature 97.9 F (36.6 C), temperature source Oral, resp. rate 18, height 5\' 7"  (1.702 m), weight 151 lb 12.8 oz (68.856 kg).  Oropharynx is without thrush or ulceration. No palpable cervical or supraclavicular lymph nodes. Lungs are clear. Regular cardiac rhythm. Port-A-Cath site is without erythema. Abdomen is soft and nontender. Bowel sounds present. No leg edema. Calves soft and nontender. No skin rash. She is alert and oriented.  Lab Results: Lab Results  Component Value Date   WBC 3.5* 08/04/2013   HGB 12.6 08/04/2013   HCT 38.2 08/04/2013   MCV 90.0 08/04/2013   PLT 64* 08/04/2013    Chemistry:    Chemistry      Component Value Date/Time   NA 139 07/26/2013 1015   NA 136 07/25/2013 1345   NA 143 01/27/2012 1354   K 4.0 07/26/2013 1015   K 4.3 07/25/2013 1345   K 3.8 01/27/2012 1354   CL 99 07/25/2013 1345   CL 101 04/15/2013 1230   CL 97* 01/27/2012 1354   CO2 24 07/26/2013 1015   CO2 27 07/25/2013 1345   CO2 29 01/27/2012 1354   BUN 10.6 07/26/2013 1015   BUN 12 07/25/2013 1345   BUN 11 01/27/2012 1354   CREATININE 0.8 07/26/2013 1015   CREATININE 0.71 07/25/2013 1345   CREATININE 0.9 01/27/2012 1354      Component Value Date/Time   CALCIUM 9.8 07/26/2013 1015   CALCIUM 9.9 07/25/2013 1345   CALCIUM 8.9 01/27/2012 1354   ALKPHOS 419* 07/26/2013 1015   ALKPHOS 500* 04/13/2013 0600   ALKPHOS 198* 01/27/2012 1354   AST 89* 07/26/2013 1015   AST 214*  04/13/2013 0600   AST 34 01/27/2012 1354   ALT 82* 07/26/2013 1015   ALT 220* 04/13/2013 0600   ALT 41 01/27/2012 1354   BILITOT 1.60* 07/26/2013 1015   BILITOT 3.9* 04/13/2013 0600   BILITOT 0.50 01/27/2012 1354       Studies/Results: Dg Chest 2 View  07/25/2013   CLINICAL DATA:  Chest pain  EXAM: CHEST  2 VIEW  COMPARISON:  None.  FINDINGS: AP and lateral views of the chest obtained. The lungs are clear without focal infiltrate, edema, pneumothorax or pleural effusion. The cardiopericardial silhouette is within normal limits for size. Right Port-A-Cath tip overlies the mid SVC. Imaged bony structures of the thorax are intact. Telemetry leads overlie the chest.  IMPRESSION: No active cardiopulmonary disease.   Electronically Signed   By: Kennith Center M.D.   On: 07/25/2013 14:51   Ct Angio Chest Pe W/cm &/or Wo Cm  07/25/2013   CLINICAL DATA:  57 year old female chest pain radiating to the neck and shoulder with nausea and vomiting. History of metastatic colon cancer with chemotherapy ongoing.  EXAM: CT ANGIOGRAPHY CHEST WITH CONTRAST  TECHNIQUE: Multidetector CT imaging of the chest was performed using the standard protocol during bolus administration of intravenous contrast. Multiplanar CT image reconstructions including MIPs were obtained to evaluate the vascular anatomy.  CONTRAST:  OMNIPAQUE IOHEXOL 350 MG/ML SOLN  COMPARISON:  CT Abdomen and Pelvis 03/31/2013.  FINDINGS: Good contrast bolus timing in the pulmonary arterial tree.  No focal filling defect identified in the pulmonary arterial tree to suggest the presence of acute pulmonary embolism.  Major airways are patent. Mild dependent pulmonary atelectasis. Right chest porta cath. No pericardial or pleural effusion. No mediastinal lymphadenopathy. No axillary lymphadenopathy.  No acute osseous abnormality identified.  Abnormal appearance of the liver, with heterogeneous enhancement and areas of dystrophic calcification suspected. Overall,  the visible liver appears less abnormal than 03/31/2013.  Review of the MIP images confirms the above findings.  IMPRESSION: 1. No evidence of acute pulmonary embolus.  2. No acute or metastatic findings in the chest.  3. Metastatic disease to the liver, incompletely visible the may have mildly regressed since 03/31/2013.   Electronically Signed   By: Augusto Gamble M.D.   On: 07/25/2013 16:20   Ct Abdomen Pelvis W Contrast  07/26/2013   CLINICAL DATA:  Current history of metastatic colon cancer for which the patient is undergoing chemotherapy. Patient presented for chemotherapy today but had severe upper abdominal pain.  EXAM: CT ABDOMEN AND PELVIS WITH CONTRAST  TECHNIQUE: Multidetector CT imaging of the abdomen and pelvis was performed using the standard protocol following bolus administration of intravenous contrast.  CONTRAST:  80mL OMNIPAQUE IOHEXOL 300 MG/ML IV. Oral contrast was also administered.  COMPARISON:  03/31/2013, 12/01/2012, 10/14/2012, 08/30/2012, 12/05/2011.  FINDINGS: Extensive metastatic disease involving both lobes of the liver, with calcification of several of the metastases likely due to prior treatment. An index lesion in the anterior segment right lobe near the dome with calcification in its center measures approximately 6.3 x 7.3 cm (series 2, image 12), previously approximately 5.0 x 6.7 cm at the same level on the most recent prior CT. Metastases in the central portion of the liver with moderate biliary ductal dilation in the left lobe and in the right lobe (predominantly anterior segment), slightly increased since the prior examination. No discrete new hepatic metastases.  Contracted, thick-walled gallbladder, likely containing echogenic sludge; no calcified gallstones. Moderate to marked splenomegaly, the spleen measuring approximately 14.2 x 5.7 x 19.7 cm, yielding a volume of approximately 797 cc, significantly increased in size since the 03/31/2013 examination where it measured 12.5  x 4.3 x 15.6 cm; no focal splenic parenchymal abnormality. Normal appearing pancreas, adrenal glands, and kidneys. No visible aortoiliofemoral atherosclerosis. Patent visceral arteries.  Enlarged portacaval lymph node measuring approximately 1.9 x 2.3 cm (image 26), previously 1.0 x 1.4 cm. Mildly enlarged gastrohepatic ligament lymph nodes, increased in size since the prior examination, the largest approximating 1.0 x 1.4 cm (image 19). No significant lymphadenopathy elsewhere in the abdomen or pelvis.  Normal appearing stomach and small bowel. With the exception of the cecum common the entire colon is relatively decompressed and unremarkable. Upper normal caliber appendix in the right mid abdomen, unchanged in appearance dating back to the 12/05/2011 examination; no evidence of acute appendicitis.  Urinary bladder decompressed and unremarkable. Pelvic phleboliths. Normal-appearing uterus and ovaries.  Bone window images unremarkable. Visualized lung bases clear. Heart size normal.  IMPRESSION: 1. Interval slight progression of metastatic disease to the liver since the most recent prior examination 03/31/2013. An index right lobe liver lesion as measured above. No new hepatic metastases. 2. Slight progression of intrahepatic biliary ductal dilation due to obstruction by a central metastases in both lobes of the liver. 3. Interval marked increase in size of the  spleen, measurements given above. No focal splenic parenchymal abnormalities. Is the abdominal pain in the left upper quadrant? 4. Interval increase in size of a portacaval lymph node and gastrohepatic ligament lymph nodes in the upper abdomen. Measurements are given above. 5. Contracted, thick-walled gallbladder which may contain sludge. No calcified gallstones and no CT evidence of acute cholecystitis.   Electronically Signed   By: Hulan Saas   On: 07/26/2013 18:17    Medications: I have reviewed the patient's current  medications.  Assessment/Plan:  1.Metastatic colon cancer with multiple liver metastases noted on an MRI of the abdomen 12/02/2010 and on a CT 12/13/2010. A transverse colon mass was noted on the CT from 02/11/2011 and confirmed on a colonoscopy 12/17/2010. She began treatment with FOLFIRI/Avastin 12/31/2010. A restaging CT 06/20/2011 confirmed further improvement in the metastatic liver lesions and no evidence for progressive metastatic disease. A restaging CT 08/01/2011 showed stable liver lesions and no new lesions. Restaging CT 09/12/2011 showed stable liver lesions and no new lesions. A restaging CT 10/27/2011 showed stable liver lesions and no new lesions. Restaging CT 12/05/2011 showed mild decrease in size of hepatic metastases and no evidence of disease progression. Restaging CT 01/26/2012 showed stable hepatic metastases and no new lesions. Restaging CT may 5/8/ 2013 revealed a decreased size of hepatic metastases and no new lesions. Restaging CT 04/23/2012 revealed no change in the measurable "target" lesions, a slight increase in the size of other lesions, and no new lesions. Restaging CT 06/04/2012 showed multiple hepatic metastases were grossly unchanged. Restaging CT 07/19/2012 with stable disease in the liver. Infusional 5-fluorouracil continued on a 2 week schedule. Treatment was held after the 08/03/2012 cycle due to thrombocytopenia. Restaging CT evaluation 08/30/2012 with mild increase in size of large hepatic metastases. Treatment was resumed with 5 fluorouracil and Avastin on 08/31/2012. Restaging CT 10/14/2012-stable hepatic metastases. Treatment continued with 5 fluorouracil and Avastin. Restaging CT 12/01/2012 revealed mild progression in the size of multiple hepatic lesions. Treatment was placed on hold. CEA 32.1 on 01/06/2013 and 71.9 on 03/11/2013. Restaging CT abdomen/pelvis 03/31/2013 with interval progression of liver metastases. She began cycle 1 CAPOX on 04/15/2013. The Xeloda  was prematurely discontinued on 04/25/2013 due to redness and pain over the feet. She completed cycle 2 beginning 05/24/2013 and cycle 3 beginning on 07/05/2013.  2. Abdominal pain-? Secondary to liver metastases, metastatic adenopathy, or the primary colon tumor. Less likely peptic ulcer disease.  3. Anorexia, early satiety, and weight loss secondary to metastatic colon cancer-resolved.  4. Microcytic anemia. Resolved.  5. History of migraine headaches.  6. Status post uterine fibroid surgery.  7. Port-A-Cath placement 12/20/2010.  8. Delayed nausea following cycle 1 of FOLFIRI/Avastin. Improved with the addition of Aloxi and prophylactic Decadron beginning with cycle #2. The prophylactic Decadron was tapered. We increased the day 1 Decadron back to 20 mg.  9. Neutropenia secondary to chemotherapy. Cycle #4 was held on 02/11/2011. She subsequently received Neulasta support.  10. History of intermittent fever with no apparent source for infection, likely "tumor fever" or fever related to chemotherapy. No recent fever.  11. History of mucositis secondary to 5-FU, initially grade 2. Irinotecan and 5-FU were dose reduced per study guidelines.  12. History of abdominal cramping. ? Related to irinotecan, 5-FU, or potentially the colon tumor. No recent abdominal cramping.  13. Thrombocytopenia secondary to chemotherapy (while being treated with irinotecan and Avastin). Treatment was held for thrombocytopenia per study guidelines. The thrombocytopenia may be related to Avastin, or chronic  liver disease from chemotherapy. The CT on 04/23/2012 confirmed mild splenomegaly. The CT on 06/04/2012 again showed splenomegaly. The thrombocytopenia persisted despite being off of irinotecan and Avastin for several months. The thrombocytopenia improved. She developed thrombocytopenia following cycle 1 and cycle 2 CAPOX.  14. Acne-like rash on the face. Question related to steroids. Resolved.  15. Arthralgias-most likely  unrelated to the colon cancer diagnosis and chemotherapy. She has been evaluated by orthopedics. The etiology of the arthralgias is unclear.  16. Persistent discomfort at the right shoulder. Negative CT of the right upper extremity on 07/19/2012. MRI on 08/06/2012 with a partial thickness bursal surface tear of the supraspinatus tendon, moderate rotator cuff tendinopathy/tendinosis. She is now followed by orthopedics. The pain is stable. The range of motion at the right shoulder has improved  17.? Swelling at the right upper extremity and chest wall April 2014-negative right upper extremity Doppler.  18. Delayed nausea following cycle 1 CAPOX. Emend and Aloxi were added with cycle 2.  19. Mucositis and hand-foot syndrome secondary to Xeloda. The Xeloda was dose reduced to 1000 mg twice daily for 14 days with cycle 2.  20. Thrombocytopenia following cycle 1 CAPOX. Cycle 2 and cycle 3 were delayed due to thrombocytopenia. The oxaliplatin was further dose reduced with cycle 3.  21.New onset pruritus 04/04/2013-hyperbilirubinemia and an elevated alkaline phosphatase noted on labs 04/06/2013, the CT 03/31/2013 revealed peripheral biliary ductal dilatation in the medial segment of the left hepatic lobe,no options for intervention after evaluation by Dr. Ewing Schlein. The hyperbilirubinemia initially resolved and was mildly elevated on 07/26/2013.  22. Acute severe upper abdominal pain, pleuritic, with radiation to the chest/shoulders beginning 07/25/2013-etiology unclear. Chest CT 07/25/2013 showed no evidence of an acute pulmonary embolus and no acute or metastatic findings in the chest. CT abdomen/pelvis on 07/26/2013 showed interval slight progression of metastatic disease to the liver; no new hepatic metastases; slight progression of intrahepatic biliary ductal dilatation; interval marked increase in the size of the spleen; interval increase in the size of a portacaval lymph node and gastrohepatic ligament lymph  nodes; contracted thickwalled gallbladder possibly containing sludge. The pain is likely related to metastatic disease involving the liver with possible irritation of the diaphragm. The pain is better/well-controlled with a combination of Percocet and Motrin.  Disposition-Natalie Mccarthy appears stable. Following her last visit on 07/27/2013 we requested extended K-ras testing. Unfortunately there was not enough sample available to perform the test. She understands a biopsy for additional tissue will be needed.  We made a referral to the radiology department at [facility] for a liver biopsy to obtain additional tissue for extended K-ras testing.  She will return for a followup visit on 08/17/2013 to review the results. She will continue Percocet and Motrin as needed for the pain. She will contact the office with any problems prior to her next visit.  Plan reviewed with Dr. Truett Perna.  Lonna Cobb ANP/GNP-BC

## 2013-08-04 NOTE — Telephone Encounter (Signed)
Gave pt appt for ML on october 2014 pt sent to labs today and inform  pt she will be call by Radiology scheduling

## 2013-08-05 ENCOUNTER — Other Ambulatory Visit: Payer: Self-pay | Admitting: Radiology

## 2013-08-05 ENCOUNTER — Telehealth: Payer: Self-pay | Admitting: *Deleted

## 2013-08-05 ENCOUNTER — Encounter (HOSPITAL_COMMUNITY): Payer: Self-pay | Admitting: Pharmacy Technician

## 2013-08-05 NOTE — Telephone Encounter (Signed)
Wanted Dr. Richmond Campbell aware that biopsy staff will check her counts on day of procedure. Does she still need to come to office day prior for labs? Bx for 08/09/13 Per Lonna Cobb, No.

## 2013-08-08 ENCOUNTER — Other Ambulatory Visit: Payer: Self-pay | Admitting: *Deleted

## 2013-08-08 MED ORDER — IBUPROFEN 600 MG PO TABS: 600.0000 mg | ORAL_TABLET | Freq: Four times a day (QID) | ORAL | Status: DC | PRN

## 2013-08-08 NOTE — Telephone Encounter (Signed)
Call from pt requesting refill on Ibuprofen 800 mg tabs. Reviewed with Misty Stanley, NP. Order received to decrease Ibuprofen to 600 mg Q6 hours PRN. Called pt with instructions, she voiced understanding.

## 2013-08-09 ENCOUNTER — Other Ambulatory Visit: Payer: Self-pay | Admitting: Radiology

## 2013-08-09 ENCOUNTER — Encounter (HOSPITAL_COMMUNITY): Payer: Self-pay

## 2013-08-09 ENCOUNTER — Ambulatory Visit (HOSPITAL_COMMUNITY)
Admission: RE | Admit: 2013-08-09 | Discharge: 2013-08-09 | Disposition: A | Discharge status: Home / Self Care | Payer: BC Managed Care – PPO | Source: Ambulatory Visit | Attending: Nurse Practitioner | Admitting: Nurse Practitioner

## 2013-08-09 DIAGNOSIS — C189 Malignant neoplasm of colon, unspecified: Secondary | ICD-10-CM

## 2013-08-09 DIAGNOSIS — Z9221 Personal history of antineoplastic chemotherapy: Secondary | ICD-10-CM | POA: Insufficient documentation

## 2013-08-09 DIAGNOSIS — Z01812 Encounter for preprocedural laboratory examination: Secondary | ICD-10-CM | POA: Insufficient documentation

## 2013-08-09 DIAGNOSIS — Z79899 Other long term (current) drug therapy: Secondary | ICD-10-CM | POA: Insufficient documentation

## 2013-08-09 DIAGNOSIS — Z5309 Procedure and treatment not carried out because of other contraindication: Secondary | ICD-10-CM | POA: Insufficient documentation

## 2013-08-09 DIAGNOSIS — C801 Malignant (primary) neoplasm, unspecified: Secondary | ICD-10-CM | POA: Insufficient documentation

## 2013-08-09 DIAGNOSIS — K7689 Other specified diseases of liver: Secondary | ICD-10-CM | POA: Insufficient documentation

## 2013-08-09 LAB — CBC
HCT: 36.6 % (ref 36.0–46.0)
MCH: 29.6 pg (ref 26.0–34.0)
MCHC: 32.8 g/dL (ref 30.0–36.0)
Platelets: 67 10*3/uL — ABNORMAL LOW (ref 150–400)
RBC: 4.06 MIL/uL (ref 3.87–5.11)
RDW: 16.8 % — ABNORMAL HIGH (ref 11.5–15.5)
WBC: 3.3 10*3/uL — ABNORMAL LOW (ref 4.0–10.5)

## 2013-08-09 LAB — PROTIME-INR: Prothrombin Time: 13 seconds (ref 11.6–15.2)

## 2013-08-09 MED ORDER — MIDAZOLAM HCL 2 MG/2ML IJ SOLN
INTRAMUSCULAR | Status: AC
  Filled 2013-08-09: qty 6

## 2013-08-09 MED ORDER — FENTANYL CITRATE 0.05 MG/ML IJ SOLN
INTRAMUSCULAR | Status: AC
  Filled 2013-08-09: qty 6

## 2013-08-09 MED ORDER — HEPARIN SOD (PORK) LOCK FLUSH 100 UNIT/ML IV SOLN
500.0000 [IU] | INTRAVENOUS | Status: AC | PRN
  Administered 2013-08-09: 500 [IU]

## 2013-08-09 MED ORDER — SODIUM CHLORIDE 0.9 % IV SOLN
INTRAVENOUS | Status: DC
  Administered 2013-08-09: 20 mL/h via INTRAVENOUS

## 2013-08-09 NOTE — H&P (Signed)
Natalie Mccarthy is an 57 y.o. female.   Chief Complaint: "I'm having a liver biopsy" HPI: Patient with history of metastatic colon carcinoma and multiple liver lesions presents today for US guided liver lesion biopsy for Kras testing.  Past Medical History  Diagnosis Date  . History of migraine headaches   . History of mononucleosis   . History of recurrent miscarriages, not currently pregnant   . Microcytic anemia   . Colon cancer 12/2010    metastatic  adenocarcinoma w/liver masses  . PONV (postoperative nausea and vomiting)   . Chemotherapy adverse reaction 04-07-13    last tx. 1'14- did caused platelet to drop 120's, now climbing up    Past Surgical History  Procedure Laterality Date  . Shoulder surgery  2009    Left shoulder  . Tonsillectomy      as child  . Portacath placement  12/20/2010    remains on right chest  . Ercp N/A 04/12/2013    Procedure: ENDOSCOPIC RETROGRADE CHOLANGIOPANCREATOGRAPHY (ERCP);  Surgeon: Petra Kuba, MD;  Location: [facility];  Service: Endoscopy;  Laterality: N/A;    History reviewed. No pertinent family history. Social History:  reports that she has never smoked. She has never used smokeless tobacco. She reports that she does not drink alcohol or use illicit drugs.  Allergies:  Allergies  Allergen Reactions  . Compazine Other (See Comments)    "unknown"--family H/O allergy per patient    Current outpatient prescriptions:ibuprofen (ADVIL,MOTRIN) 600 MG tablet, Take 1 tablet (600 mg total) by mouth every 6 (six) hours as needed for pain., Disp: 90 tablet, Rfl: 0;  ondansetron (ZOFRAN) 8 MG tablet, Take 1 tablet (8 mg total) by mouth every 12 (twelve) hours as needed for nausea., Disp: 20 tablet, Rfl: 1 oxyCODONE-acetaminophen (PERCOCET/ROXICET) 5-325 MG per tablet, Take 1-2 tablets by mouth every 6 (six) hours as needed for pain., Disp: 75 tablet, Rfl: 0;  pantoprazole (PROTONIX) 40 MG tablet, Take 40 mg by mouth every morning., Disp: , Rfl: ;   ALPRAZolam (XANAX) 0.5 MG tablet, Take 0.5-1 mg by mouth 2 (two) times daily as needed for sleep or anxiety., Disp: , Rfl:  SUMAtriptan (IMITREX) 100 MG tablet, Take 100 mg by mouth daily as needed for migraine. May repeat in 2 hours if headache persists or recurs., Disp: , Rfl:  Current facility-administered medications:0.9 %  sodium chloride infusion, , Intravenous, Continuous, Brayton El, PA-C   Results for orders placed during the hospital encounter of 08/09/13 (from the past 48 hour(s))  APTT     Status: None   Collection Time    08/09/13 11:45 AM      Result Value Range   aPTT 36  24 - 37 seconds  CBC     Status: Abnormal (Preliminary result)   Collection Time    08/09/13 11:45 AM      Result Value Range   WBC 3.3 (*) 4.0 - 10.5 K/uL   RBC 4.06  3.87 - 5.11 MIL/uL   Hemoglobin 12.0  12.0 - 15.0 g/dL   HCT 16.1  09.6 - 04.5 %   MCV 90.1  78.0 - 100.0 fL   MCH 29.6  26.0 - 34.0 pg   MCHC 32.8  30.0 - 36.0 g/dL   RDW 40.9 (*) 81.1 - 91.4 %   Platelets PENDING  150 - 400 K/uL  PROTIME-INR     Status: None   Collection Time    08/09/13 11:45 AM      Result  Value Range   Prothrombin Time 13.0  11.6 - 15.2 seconds   INR 1.00  0.00 - 1.49   No results found.  Review of Systems  Constitutional: Negative for fever and chills.  HENT:       Occ migraine HA's  Respiratory: Negative for cough and shortness of breath.   Cardiovascular: Negative for chest pain.  Gastrointestinal: Positive for abdominal pain. Negative for vomiting and blood in stool.       Occ nausea  Genitourinary: Negative for dysuria and hematuria.  Musculoskeletal: Positive for back pain.  Endo/Heme/Allergies: Does not bruise/bleed easily.    Blood pressure 113/81, pulse 75, temperature 98.2 F (36.8 C), temperature source Oral, resp. rate 18, SpO2 98.00%. Physical Exam  Constitutional: She is oriented to person, place, and time. She appears well-developed and well-nourished.  Cardiovascular: Normal  rate and regular rhythm.   Respiratory: Effort normal and breath sounds normal.  GI: Soft. Bowel sounds are normal. There is tenderness.  Musculoskeletal: Normal range of motion. She exhibits no edema.  Neurological: She is alert and oriented to person, place, and time.     Assessment/Plan Pt with hx of metastatic colon carcinoma and multiple liver lesions. Tent plan is for US guided liver lesion biopsy today for Kras testing. Details/risks of procedure d/w pt/husband with their understanding and consent. Pt also with hx of thrombocytopenia- current plt count is 67k. Following d/w Dr. Lorenda Cahill, decision made to postpone biopsy for now due to thrombocytopenia and reschedule later with possible platelet transfusion preprocedure.   Yeshua Stryker,D KEVIN 08/09/2013, 12:26 PM

## 2013-08-09 NOTE — Progress Notes (Signed)
Pt.'s platelets too low for procedure per Dr. Miles Costain, pt. To be rescheduled.

## 2013-08-10 ENCOUNTER — Encounter (HOSPITAL_COMMUNITY): Payer: Self-pay

## 2013-08-10 ENCOUNTER — Ambulatory Visit (HOSPITAL_COMMUNITY)
Admission: RE | Admit: 2013-08-10 | Discharge: 2013-08-10 | Disposition: A | Discharge status: Home / Self Care | Payer: BC Managed Care – PPO | Source: Ambulatory Visit | Attending: Nurse Practitioner | Admitting: Nurse Practitioner

## 2013-08-10 ENCOUNTER — Telehealth: Payer: Self-pay | Admitting: *Deleted

## 2013-08-10 VITALS — BP 125/74 | HR 77 | Temp 98.2°F | Resp 16

## 2013-08-10 DIAGNOSIS — C189 Malignant neoplasm of colon, unspecified: Secondary | ICD-10-CM | POA: Insufficient documentation

## 2013-08-10 DIAGNOSIS — R112 Nausea with vomiting, unspecified: Secondary | ICD-10-CM | POA: Insufficient documentation

## 2013-08-10 DIAGNOSIS — Z79899 Other long term (current) drug therapy: Secondary | ICD-10-CM | POA: Insufficient documentation

## 2013-08-10 DIAGNOSIS — C787 Secondary malignant neoplasm of liver and intrahepatic bile duct: Secondary | ICD-10-CM | POA: Insufficient documentation

## 2013-08-10 DIAGNOSIS — D696 Thrombocytopenia, unspecified: Secondary | ICD-10-CM | POA: Insufficient documentation

## 2013-08-10 LAB — CBC
HCT: 38.2 % (ref 36.0–46.0)
MCH: 29.4 pg (ref 26.0–34.0)
MCV: 89.9 fL (ref 78.0–100.0)
Platelets: 64 10*3/uL — ABNORMAL LOW (ref 150–400)
RBC: 4.25 MIL/uL (ref 3.87–5.11)
WBC: 4 10*3/uL (ref 4.0–10.5)

## 2013-08-10 MED ORDER — MIDAZOLAM HCL 2 MG/2ML IJ SOLN
INTRAMUSCULAR | Status: AC | PRN
  Administered 2013-08-10 (×2): 1 mg via INTRAVENOUS

## 2013-08-10 MED ORDER — MIDAZOLAM HCL 2 MG/2ML IJ SOLN
INTRAMUSCULAR | Status: AC
  Filled 2013-08-10: qty 6

## 2013-08-10 MED ORDER — FENTANYL CITRATE 0.05 MG/ML IJ SOLN
INTRAMUSCULAR | Status: AC
  Filled 2013-08-10: qty 6

## 2013-08-10 MED ORDER — FENTANYL CITRATE 0.05 MG/ML IJ SOLN
INTRAMUSCULAR | Status: AC | PRN
  Administered 2013-08-10 (×2): 50 ug via INTRAVENOUS

## 2013-08-10 MED ORDER — HEPARIN SOD (PORK) LOCK FLUSH 100 UNIT/ML IV SOLN
500.0000 [IU] | INTRAVENOUS | Status: AC | PRN
  Administered 2013-08-10: 500 [IU]
  Filled 2013-08-10: qty 5

## 2013-08-10 MED ORDER — SODIUM CHLORIDE 0.9 % IV SOLN
INTRAVENOUS | Status: DC
  Administered 2013-08-10: 10:00:00 via INTRAVENOUS

## 2013-08-10 MED ORDER — HYDROCODONE-ACETAMINOPHEN 5-325 MG PO TABS
1.0000 | ORAL_TABLET | ORAL | Status: DC | PRN
  Administered 2013-08-10: 1 via ORAL
  Filled 2013-08-10 (×2): qty 2

## 2013-08-10 MED ORDER — ONDANSETRON HCL 4 MG/2ML IJ SOLN
4.0000 mg | Freq: Once | INTRAMUSCULAR | Status: AC
  Administered 2013-08-10: 4 mg via INTRAVENOUS
  Filled 2013-08-10 (×2): qty 2

## 2013-08-10 NOTE — Procedures (Signed)
Successful RT LIVER MASS 18G CORE BX NO COMP STABLE FULL REPORT IN PACS

## 2013-08-10 NOTE — Telephone Encounter (Signed)
Call from [place] with [facility] [phone] # [phone]) requesting additional material for extended KRAS testing. Informed her pt is having a biopsy to obtain additional tissue. Pathology will send material when obtained.

## 2013-08-10 NOTE — H&P (Signed)
HPI: Natalie Mccarthy is an 57 y.o. female who presented 08/09/13 for an image guided liver lesion biopsy, see full H&P. Patient denies any interval changes. Platelet count today 64K, will receive 1 unit of platelet pre-procedure and 1 unit post-procedure. She denies any chest pain or shortness of breath. She does admit to (1) episode of emesis this am at 9:15 which she took zofran for. She does have chronic nausea, no episodes since this morning. She denies any blood in her stool or urine.   Past Medical History:  Past Medical History  Diagnosis Date  . History of migraine headaches   . History of mononucleosis   . History of recurrent miscarriages, not currently pregnant   . Microcytic anemia   . Colon cancer 12/2010    metastatic  adenocarcinoma w/liver masses  . PONV (postoperative nausea and vomiting)   . Chemotherapy adverse reaction 04-07-13    last tx. 1'14- did caused platelet to drop 120's, now climbing up    Past Surgical History:  Past Surgical History  Procedure Laterality Date  . Shoulder surgery  2009    Left shoulder  . Tonsillectomy      as child  . Portacath placement  12/20/2010    remains on right chest  . Ercp N/A 04/12/2013    Procedure: ENDOSCOPIC RETROGRADE CHOLANGIOPANCREATOGRAPHY (ERCP);  Surgeon: Petra Kuba, MD;  Location: [facility];  Service: Endoscopy;  Laterality: N/A;    Family History: History reviewed. No pertinent family history.  Social History:  reports that she has never smoked. She has never used smokeless tobacco. She reports that she does not drink alcohol or use illicit drugs.  Allergies:  Allergies  Allergen Reactions  . Compazine Other (See Comments)    "unknown"--family H/O allergy per patient    Medications:   Medication List    ASK your doctor about these medications       ALPRAZolam 0.5 MG tablet  Commonly known as:  XANAX  Take 0.5-1 mg by mouth 2 (two) times daily as needed for sleep or anxiety.     ibuprofen 600 MG  tablet  Commonly known as:  ADVIL,MOTRIN  Take 1 tablet (600 mg total) by mouth every 6 (six) hours as needed for pain.     ondansetron 8 MG tablet  Commonly known as:  ZOFRAN  Take 1 tablet (8 mg total) by mouth every 12 (twelve) hours as needed for nausea.     oxyCODONE-acetaminophen 5-325 MG per tablet  Commonly known as:  PERCOCET/ROXICET  Take 1-2 tablets by mouth every 6 (six) hours as needed for pain.     pantoprazole 40 MG tablet  Commonly known as:  PROTONIX  Take 40 mg by mouth every morning.     SUMAtriptan 100 MG tablet  Commonly known as:  IMITREX  Take 100 mg by mouth daily as needed for migraine. May repeat in 2 hours if headache persists or recurs.       Please HPI for pertinent positives, otherwise complete 10 system ROS negative.  Physical Exam: BP 138/76  Pulse 76  Temp(Src) 98 F (36.7 C) (Oral)  Resp 20  SpO2 98% There is no weight on file to calculate BMI.   General Appearance:  Alert, cooperative, no distress  Head:  Normocephalic, without obvious abnormality, atraumatic  Neck: Supple, symmetrical, trachea midline  Lungs:   Clear to auscultation bilaterally, no w/r/r, respirations unlabored without use of accessory muscles.  Chest Wall:  No tenderness or deformity  Heart:  Regular rate and rhythm, S1, S2 normal, no murmur, rub or gallop.  Abdomen:   Soft, tender, non distended, (+) BS  Extremities: Extremities normal, atraumatic, no cyanosis or edema  Pulses: 2+ and symmetric  Neurologic: Normal affect, no gross deficits.   Results for orders placed during the hospital encounter of 08/10/13 (from the past 48 hour(s))  PREPARE PLATELET PHERESIS     Status: None   Collection Time    08/10/13  9:00 AM      Result Value Range   Unit Number [id number]     Blood Component Type PLTPHER LR1     Unit division 00     Status of Unit ISSUED     Transfusion Status OK TO TRANSFUSE     Unit Number [id number]     Blood Component Type PLTPHER LR2      Unit division 00     Status of Unit ALLOCATED     Transfusion Status OK TO TRANSFUSE    CBC     Status: Abnormal   Collection Time    08/10/13  9:52 AM      Result Value Range   WBC 4.0  4.0 - 10.5 K/uL   RBC 4.25  3.87 - 5.11 MIL/uL   Hemoglobin 12.5  12.0 - 15.0 g/dL   HCT 30.8  65.7 - 84.6 %   MCV 89.9  78.0 - 100.0 fL   MCH 29.4  26.0 - 34.0 pg   MCHC 32.7  30.0 - 36.0 g/dL   RDW 96.2 (*) 95.2 - 84.1 %   Platelets 64 (*) 150 - 400 K/uL   Comment: CONSISTENT WITH PREVIOUS RESULT   No results found.  Assessment/Plan History of metastatic colon cancer with multiple liver lesions. Scheduled for image guided liver lesion biopsy 10/8 platelets low, rescheduled for today with platelet transfusion pre and post procedure, platelet count today 65k, Dr. Miles Costain aware and willing to proceed with transfusion. Labs reviewed, patient has been NPO. Risks and Benefits discussed with the patient. All of the patient's questions were answered, patient is agreeable to proceed. Consent signed and in chart.   Pattricia Boss D PA-C 08/10/2013, 10:56 AM

## 2013-08-11 ENCOUNTER — Telehealth: Payer: Self-pay | Admitting: *Deleted

## 2013-08-11 LAB — PREPARE PLATELET PHERESIS
Unit division: 0
Unit division: 0

## 2013-08-11 NOTE — Telephone Encounter (Signed)
Request for extended KRAS testing (on liver biopsy from 08-10-13 Accession # [id number]) submitted to and confirmed with [facility]. Spoke with Javatta Epps.  [facility] form signed by Dr. Truett Perna and faxed to [facility].

## 2013-08-12 ENCOUNTER — Other Ambulatory Visit (HOSPITAL_COMMUNITY)
Admission: RE | Admit: 2013-08-12 | Discharge: 2013-08-12 | Disposition: A | Discharge status: Home / Self Care | Payer: BC Managed Care – PPO | Source: Ambulatory Visit | Attending: Oncology | Admitting: Oncology

## 2013-08-12 ENCOUNTER — Telehealth: Payer: Self-pay | Admitting: *Deleted

## 2013-08-12 DIAGNOSIS — C189 Malignant neoplasm of colon, unspecified: Secondary | ICD-10-CM | POA: Insufficient documentation

## 2013-08-12 NOTE — Telephone Encounter (Signed)
Call from pt requesting stronger pain med to be called in. She reports she is having increased abdominal pain. Unsure if it is related to biopsy, just wants to have something on hand in case pain worsens over the weekend. Currently taking Percocet 5/325 Q 6 PRN. Informed her she would need to pick up Rx in the office. She has not tried taking #2 tablets. Rx is for 1-2 as needed. She stated she will try increasing to 2 tabs if needed over the weekend. Will call on Monday if this were ineffective.

## 2013-08-17 ENCOUNTER — Ambulatory Visit: Payer: BC Managed Care – PPO | Admitting: Nurse Practitioner

## 2013-08-17 ENCOUNTER — Other Ambulatory Visit: Payer: Self-pay | Admitting: *Deleted

## 2013-08-17 ENCOUNTER — Telehealth: Payer: Self-pay | Admitting: *Deleted

## 2013-08-17 DIAGNOSIS — C189 Malignant neoplasm of colon, unspecified: Secondary | ICD-10-CM

## 2013-08-17 MED ORDER — ONDANSETRON HCL 8 MG PO TABS: 8.0000 mg | ORAL_TABLET | Freq: Two times a day (BID) | ORAL | Status: DC | PRN

## 2013-08-17 NOTE — Telephone Encounter (Signed)
Spoke with patient by phone.  Per Dr. Truett Perna, no need to come in for appointment today because the KRAS testing results are not back.  Received a notice from [facility] informing MD that the results will not be available until 09/02/13.  This RN called the pathology department and requested if Dr. Frederica Kuster can contact [facility] and expedite the testing.  Patient aware of above.  Agreed with the plan.  Will be re-scheduled as soon as it is known when results will be available.

## 2013-08-17 NOTE — Telephone Encounter (Signed)
POF sent to scheduler to re-schedule patient for 09/01/13.  Patient aware.

## 2013-08-18 ENCOUNTER — Telehealth: Payer: Self-pay | Admitting: *Deleted

## 2013-08-18 ENCOUNTER — Telehealth: Payer: Self-pay | Admitting: Oncology

## 2013-08-18 DIAGNOSIS — C189 Malignant neoplasm of colon, unspecified: Secondary | ICD-10-CM

## 2013-08-18 MED ORDER — SENNOSIDES-DOCUSATE SODIUM 8.6-50 MG PO TABS: 2.0000 | ORAL_TABLET | Freq: Every day | ORAL | Status: DC

## 2013-08-18 MED ORDER — POLYETHYLENE GLYCOL 3350 17 GM/SCOOP PO POWD: 17.0000 g | Freq: Every day | ORAL | Status: DC

## 2013-08-18 NOTE — Telephone Encounter (Signed)
lmonvm adviisng the pt of her appt with dr Truett Perna on 09/01/2013

## 2013-08-18 NOTE — Telephone Encounter (Signed)
Natalie Mccarthy with [facility] called reporting they called patient to set up capecitabine refill.  Natalie Mccarthy informed them she is no longer on this drug.  Would like to know why.  Per Dr. Truett Perna, patient has had disease progression and will be started on something else.  Natalie Mccarthy given this information at this time.

## 2013-08-18 NOTE — Telephone Encounter (Signed)
Taking Senokot-S #2 at bedtime past 5 nights and getting some movement, but not much. Asking what else to try. Instructed her to add MiraLax 17 grams daily and call back with report.

## 2013-08-22 ENCOUNTER — Telehealth: Payer: Self-pay | Admitting: *Deleted

## 2013-08-22 NOTE — Telephone Encounter (Signed)
Taking Senna-S #2 nightly and MiraLax daily with minimal improvement in her bowel movements. Asking if it is time for something stronger?

## 2013-08-24 ENCOUNTER — Telehealth: Payer: Self-pay | Admitting: *Deleted

## 2013-08-24 ENCOUNTER — Telehealth: Payer: Self-pay | Admitting: Oncology

## 2013-08-24 NOTE — Telephone Encounter (Signed)
Called pt. In response to her constipation, spoke with husband [place]. Patient instructed to increase senna to 2 tablets twice a day and miralax to twice a day per Dr. Truett Perna.  Also instructed pt. To drink warm drink after miralax.  Glen voiced understanding, instructed them to call if constipation does not improve.

## 2013-08-24 NOTE — Telephone Encounter (Signed)
Talked to pt and gave her appt for 08/31/13 ML visit r/s from 10/30 per MD

## 2013-08-31 ENCOUNTER — Telehealth: Payer: Self-pay | Admitting: *Deleted

## 2013-08-31 ENCOUNTER — Telehealth: Payer: Self-pay | Admitting: Oncology

## 2013-08-31 ENCOUNTER — Ambulatory Visit (HOSPITAL_BASED_OUTPATIENT_CLINIC_OR_DEPARTMENT_OTHER): Payer: BC Managed Care – PPO | Admitting: Nurse Practitioner

## 2013-08-31 VITALS — BP 115/71 | HR 73 | Temp 97.4°F | Resp 19 | Ht 67.0 in | Wt 154.1 lb

## 2013-08-31 DIAGNOSIS — C184 Malignant neoplasm of transverse colon: Secondary | ICD-10-CM

## 2013-08-31 DIAGNOSIS — C787 Secondary malignant neoplasm of liver and intrahepatic bile duct: Secondary | ICD-10-CM

## 2013-08-31 DIAGNOSIS — G893 Neoplasm related pain (acute) (chronic): Secondary | ICD-10-CM

## 2013-08-31 DIAGNOSIS — C189 Malignant neoplasm of colon, unspecified: Secondary | ICD-10-CM

## 2013-08-31 DIAGNOSIS — K5900 Constipation, unspecified: Secondary | ICD-10-CM

## 2013-08-31 MED ORDER — MINOCYCLINE HCL 100 MG PO CAPS: 100.0000 mg | ORAL_CAPSULE | Freq: Two times a day (BID) | ORAL | Status: DC

## 2013-08-31 MED ORDER — OXYCODONE-ACETAMINOPHEN 5-325 MG PO TABS: 1.0000 | ORAL_TABLET | Freq: Four times a day (QID) | ORAL | Status: DC | PRN

## 2013-08-31 MED ORDER — SORBITOL 70 % PO SOLN: 15.0000 mL | Freq: Every day | ORAL | Status: DC | PRN

## 2013-08-31 NOTE — Telephone Encounter (Signed)
gv and printed appt sched and avs for pt for NOV and DEC...MW added tx  °

## 2013-08-31 NOTE — Progress Notes (Addendum)
OFFICE PROGRESS NOTE  Interval history:  Natalie Mccarthy is a 57 year old woman with metastatic colon cancer. She underwent biopsy of a right liver mass on 08/10/2013 to obtain additional tissue for extended K-ras testing which returned negative for the K-ras mutation.  She is seen today for scheduled followup.  She continues to have low-grade abdominal pain. The pain is fairly well-controlled with Percocet every 6 hours as needed. She estimates taking Percocet 3 times a day. Occasionally she takes Percocet during the night as well. She has intermittent nausea. She has been constipated. She is currently taking Senokot-S and MiraLAX. Appetite is diminished.   Objective: There were no vitals taken for this visit.  Oropharynx is without thrush or ulceration. Lungs are clear. Regular cardiac rhythm. Port-A-Cath site is without erythema. Abdomen is soft with firmness and associated tenderness at the right upper medial abdomen. Bowel sounds hypoactive. Extremities without edema. Calves soft and nontender. No skin rash.  Lab Results: Lab Results  Component Value Date   WBC 4.0 08/10/2013   HGB 12.5 08/10/2013   HCT 38.2 08/10/2013   MCV 89.9 08/10/2013   PLT 64* 08/10/2013    Chemistry:    Chemistry      Component Value Date/Time   NA 139 07/26/2013 1015   NA 136 07/25/2013 1345   NA 143 01/27/2012 1354   K 4.0 07/26/2013 1015   K 4.3 07/25/2013 1345   K 3.8 01/27/2012 1354   CL 99 07/25/2013 1345   CL 101 04/15/2013 1230   CL 97* 01/27/2012 1354   CO2 24 07/26/2013 1015   CO2 27 07/25/2013 1345   CO2 29 01/27/2012 1354   BUN 10.6 07/26/2013 1015   BUN 12 07/25/2013 1345   BUN 11 01/27/2012 1354   CREATININE 0.8 07/26/2013 1015   CREATININE 0.71 07/25/2013 1345   CREATININE 0.9 01/27/2012 1354      Component Value Date/Time   CALCIUM 9.8 07/26/2013 1015   CALCIUM 9.9 07/25/2013 1345   CALCIUM 8.9 01/27/2012 1354   ALKPHOS 419* 07/26/2013 1015   ALKPHOS 500* 04/13/2013 0600   ALKPHOS 198* 01/27/2012  1354   AST 89* 07/26/2013 1015   AST 214* 04/13/2013 0600   AST 34 01/27/2012 1354   ALT 82* 07/26/2013 1015   ALT 220* 04/13/2013 0600   ALT 41 01/27/2012 1354   BILITOT 1.60* 07/26/2013 1015   BILITOT 3.9* 04/13/2013 0600   BILITOT 0.50 01/27/2012 1354       Studies/Results: US Biopsy  08/10/2013   *RADIOLOGY REPORT*  Clinical Data: Metastatic colon cancer to the liver, additional core biopsies necessary for KRAS testing  ULTRASOUND RIGHT LIVER MASS 18 GAUGE CORE BIOPSY  Date:  08/10/2013 11:00:00  Radiologist:  M. Ruel Favors, M.D.  Medications:  2 mg Versed, 100 mcg Fentanyl, the patient was transfused with 2 units of platelets because of chronic thrombocytopenia (platelets 64).  Guidance:  Ultrasound  Fluoroscopy time:  None.  Sedation time:  10 minutes  Contrast volume:  None.  Complications:  No immediate  PROCEDURE/FINDINGS:  Informed consent was obtained from the patient following explanation of the procedure, risks, benefits and alternatives. The patient understands, agrees and consents for the procedure. All questions were addressed.  A time out was performed.  Maximal barrier sterile technique utilized including caps, mask, sterile gowns, sterile gloves, large sterile drape, hand hygiene, and betadine  Previous imaging reviewed.  Preliminary ultrasound performed. There is a well visualized central right hepatic lesion in the mid axillary line through  a lower intercostal space.  Under sterile conditions and local anesthesia, a 17 gauge 6.8 cm access needle was advanced percutaneously under direct ultrasound into the lesion.  Needle position confirmed with ultrasound.  Images obtained for documentation.  2  18 gauge core biopsies obtained and placed in formalin.  Needle tract was embolized with Gelfoam.  Post procedure imaging demonstrates no evidence of hemorrhage or hematoma.  The patient tolerated the biopsy well.  IMPRESSION: Successful ultrasound right liver mass 18 gauge core biopsy   Original  Report Authenticated By: Judie Petit. Miles Costain, M.D.    Medications: I have reviewed the patient's current medications.  Assessment/Plan:  1.Metastatic colon cancer with multiple liver metastases noted on an MRI of the abdomen 12/02/2010 and on a CT 12/13/2010. A transverse colon mass was noted on the CT from 02/11/2011 and confirmed on a colonoscopy 12/17/2010. She began treatment with FOLFIRI/Avastin 12/31/2010. A restaging CT 06/20/2011 confirmed further improvement in the metastatic liver lesions and no evidence for progressive metastatic disease. A restaging CT 08/01/2011 showed stable liver lesions and no new lesions. Restaging CT 09/12/2011 showed stable liver lesions and no new lesions. A restaging CT 10/27/2011 showed stable liver lesions and no new lesions. Restaging CT 12/05/2011 showed mild decrease in size of hepatic metastases and no evidence of disease progression. Restaging CT 01/26/2012 showed stable hepatic metastases and no new lesions. Restaging CT may 5/8/ 2013 revealed a decreased size of hepatic metastases and no new lesions. Restaging CT 04/23/2012 revealed no change in the measurable "target" lesions, a slight increase in the size of other lesions, and no new lesions. Restaging CT 06/04/2012 showed multiple hepatic metastases were grossly unchanged. Restaging CT 07/19/2012 with stable disease in the liver. Infusional 5-fluorouracil continued on a 2 week schedule. Treatment was held after the 08/03/2012 cycle due to thrombocytopenia. Restaging CT evaluation 08/30/2012 with mild increase in size of large hepatic metastases. Treatment was resumed with 5 fluorouracil and Avastin on 08/31/2012. Restaging CT 10/14/2012-stable hepatic metastases. Treatment continued with 5 fluorouracil and Avastin. Restaging CT 12/01/2012 revealed mild progression in the size of multiple hepatic lesions. Treatment was placed on hold. CEA 32.1 on 01/06/2013 and 71.9 on 03/11/2013. Restaging CT abdomen/pelvis 03/31/2013  with interval progression of liver metastases. She began cycle 1 CAPOX on 04/15/2013. The Xeloda was prematurely discontinued on 04/25/2013 due to redness and pain over the feet. She completed cycle 2 beginning 05/24/2013 and cycle 3 beginning on 07/05/2013.  2. Abdominal pain-? Secondary to liver metastases, metastatic adenopathy, or the primary colon tumor. Less likely peptic ulcer disease.  3. Anorexia, early satiety, and weight loss secondary to metastatic colon cancer-resolved.  4. Microcytic anemia. Resolved.  5. History of migraine headaches.  6. Status post uterine fibroid surgery.  7. Port-A-Cath placement 12/20/2010.  8. Delayed nausea following cycle 1 of FOLFIRI/Avastin. Improved with the addition of Aloxi and prophylactic Decadron beginning with cycle #2. The prophylactic Decadron was tapered. We increased the day 1 Decadron back to 20 mg.  9. Neutropenia secondary to chemotherapy. Cycle #4 was held on 02/11/2011. She subsequently received Neulasta support.  10. History of intermittent fever with no apparent source for infection, likely "tumor fever" or fever related to chemotherapy. No recent fever.  11. History of mucositis secondary to 5-FU, initially grade 2. Irinotecan and 5-FU were dose reduced per study guidelines.  12. History of abdominal cramping. ? Related to irinotecan, 5-FU, or potentially the colon tumor. No recent abdominal cramping.  13. Thrombocytopenia secondary to chemotherapy (while being treated  with irinotecan and Avastin). Treatment was held for thrombocytopenia per study guidelines. The thrombocytopenia may be related to Avastin, or chronic liver disease from chemotherapy. The CT on 04/23/2012 confirmed mild splenomegaly. The CT on 06/04/2012 again showed splenomegaly. The thrombocytopenia persisted despite being off of irinotecan and Avastin for several months. The thrombocytopenia improved. She developed thrombocytopenia following cycle 1 and cycle 2 CAPOX.  14.  Acne-like rash on the face. Question related to steroids. Resolved.  15. Arthralgias-most likely unrelated to the colon cancer diagnosis and chemotherapy. She has been evaluated by orthopedics. The etiology of the arthralgias is unclear.  16. Persistent discomfort at the right shoulder. Negative CT of the right upper extremity on 07/19/2012. MRI on 08/06/2012 with a partial thickness bursal surface tear of the supraspinatus tendon, moderate rotator cuff tendinopathy/tendinosis. She is now followed by orthopedics. The pain is stable. The range of motion at the right shoulder has improved  17.? Swelling at the right upper extremity and chest wall April 2014-negative right upper extremity Doppler.  18. Delayed nausea following cycle 1 CAPOX. Emend and Aloxi were added with cycle 2.  19. Mucositis and hand-foot syndrome secondary to Xeloda. The Xeloda was dose reduced to 1000 mg twice daily for 14 days with cycle 2.  20. Thrombocytopenia following cycle 1 CAPOX. Cycle 2 and cycle 3 were delayed due to thrombocytopenia. The oxaliplatin was further dose reduced with cycle 3.  21.New onset pruritus 04/04/2013-hyperbilirubinemia and an elevated alkaline phosphatase noted on labs 04/06/2013, the CT 03/31/2013 revealed peripheral biliary ductal dilatation in the medial segment of the left hepatic lobe,no options for intervention after evaluation by Dr. Ewing Schlein. The hyperbilirubinemia initially resolved and was mildly elevated on 07/26/2013.  22. Acute severe upper abdominal pain, pleuritic, with radiation to the chest/shoulders beginning 07/25/2013-etiology unclear. Chest CT 07/25/2013 showed no evidence of an acute pulmonary embolus and no acute or metastatic findings in the chest. CT abdomen/pelvis on 07/26/2013 showed interval slight progression of metastatic disease to the liver; no new hepatic metastases; slight progression of intrahepatic biliary ductal dilatation; interval marked increase in the size of the  spleen; interval increase in the size of a portacaval lymph node and gastrohepatic ligament lymph nodes; contracted thickwalled gallbladder possibly containing sludge. The pain is likely related to metastatic disease involving the liver with possible irritation of the diaphragm. The pain is better/well-controlled with a combination of Percocet and Motrin. 23. Status post biopsy of a liver lesion 08/10/2013. Pathology confirmed metastatic adenocarcinoma. Extended K-ras testing returned negative for the K-ras mutation. 24. Constipation. Likely related to narcotics. She will increase MiraLAX and Senokot-S to twice daily. She was given a prescription for sorbitol 15 cc daily as needed. She will contact the office if these measures are not effective.  Disposition-Natalie Mccarthy appears stable. Dr. Truett Perna reviewed the extended K-ras testing results with Natalie Mccarthy and her husband.  Dr. Truett Perna recommends irinotecan and Panitumumab on a 2 week schedule. She has had irinotecan in the past and is familiar with the potential toxicities including myelosuppression, nausea, diarrhea, possibly severe. We also reviewed potential toxicities associated with Panitumumab including skin rash, diarrhea, allergic reaction, wasting of potassium and magnesium.  She will begin minocycline 100 mg twice daily.  She will return to begin cycle 1 on 09/05/2013. We will obtain baseline labs to include a CBC, chemistry panel and CEA prior to treatment. We will see her prior to cycle 2 on 09/20/2013.  Approximately 30 minutes were spent face-to-face at today's visit with the majority of  that time involved in counseling/coordination of care.  Patient seen with Dr. Truett Perna. Lonna Cobb ANP/GNP-BC   This was a shared visit with Lonna Cobb. The extended K-ras testing did not reveal a mutation. The plan is to proceed with irinotecan and panitumumab on a 2 week schedule. I reviewed the potential toxicities associated with these  agents. She agrees to proceed. Hopefully the abdominal pain will improve with the salvage therapy.   Mancel Bale, M.D.

## 2013-08-31 NOTE — Telephone Encounter (Signed)
Per staff message and POF I have scheduled appts.  JMW  

## 2013-09-01 ENCOUNTER — Ambulatory Visit: Payer: BC Managed Care – PPO | Admitting: Oncology

## 2013-09-01 ENCOUNTER — Other Ambulatory Visit: Payer: Self-pay | Admitting: *Deleted

## 2013-09-01 DIAGNOSIS — C189 Malignant neoplasm of colon, unspecified: Secondary | ICD-10-CM

## 2013-09-01 MED ORDER — INFLUENZA VAC SPLIT QUAD 0.5 ML IM SUSP: 0.5000 mL | INTRAMUSCULAR | Status: DC

## 2013-09-02 ENCOUNTER — Ambulatory Visit (HOSPITAL_BASED_OUTPATIENT_CLINIC_OR_DEPARTMENT_OTHER): Payer: BC Managed Care – PPO

## 2013-09-02 ENCOUNTER — Other Ambulatory Visit: Payer: Self-pay | Admitting: *Deleted

## 2013-09-02 ENCOUNTER — Telehealth: Payer: Self-pay | Admitting: Dietician

## 2013-09-02 VITALS — BP 136/75 | HR 74 | Temp 98.1°F

## 2013-09-02 DIAGNOSIS — C189 Malignant neoplasm of colon, unspecified: Secondary | ICD-10-CM

## 2013-09-02 DIAGNOSIS — Z23 Encounter for immunization: Secondary | ICD-10-CM

## 2013-09-02 DIAGNOSIS — C787 Secondary malignant neoplasm of liver and intrahepatic bile duct: Secondary | ICD-10-CM

## 2013-09-02 DIAGNOSIS — C184 Malignant neoplasm of transverse colon: Secondary | ICD-10-CM

## 2013-09-02 MED ORDER — PANTOPRAZOLE SODIUM 40 MG PO TBEC: 40.0000 mg | DELAYED_RELEASE_TABLET | Freq: Every morning | ORAL | Status: DC

## 2013-09-02 MED ORDER — INFLUENZA VAC SPLIT QUAD 0.5 ML IM SUSP
0.5000 mL | Freq: Once | INTRAMUSCULAR | Status: AC
  Administered 2013-09-02: 0.5 mL via INTRAMUSCULAR
  Filled 2013-09-02: qty 0.5

## 2013-09-02 NOTE — Telephone Encounter (Signed)
Brief Outpatient Oncology Nutrition Note  Patient has been identified to be at risk on malnutrition screen.  Wt Readings from Last 10 Encounters:  08/31/13 154 lb 1.6 oz (69.899 kg)  08/04/13 151 lb 12.8 oz (68.856 kg)  07/27/13 151 lb (68.493 kg)  07/26/13 148 lb 1.6 oz (67.178 kg)  07/05/13 150 lb 11.2 oz (68.357 kg)  06/15/13 153 lb 4.8 oz (69.536 kg)  05/09/13 150 lb 6.4 oz (68.221 kg)  04/12/13 155 lb 13.8 oz (70.7 kg)  04/12/13 155 lb 13.8 oz (70.7 kg)  04/07/13 154 lb 9.6 oz (70.126 kg)    Called patient who reported decreased appetite, nausea, increased pain, and constipation which have interfered with her oral intake.  States that she tries to eat even though she is not hungry.  Is drinking [employer].  Patient feels that she is not eating well or enough.    Will mail patient information on diet and decreased appetite, constipation, making the most of each bite, and nausea with [facility]'s contact information and coupons for the instant breakfast.    Patient to call with questions or concerns.  Oran Rein, RD, LDN

## 2013-09-05 ENCOUNTER — Other Ambulatory Visit: Payer: Self-pay | Admitting: Oncology

## 2013-09-05 ENCOUNTER — Ambulatory Visit (HOSPITAL_BASED_OUTPATIENT_CLINIC_OR_DEPARTMENT_OTHER): Payer: BC Managed Care – PPO

## 2013-09-05 ENCOUNTER — Other Ambulatory Visit: Payer: BC Managed Care – PPO | Admitting: Lab

## 2013-09-05 ENCOUNTER — Other Ambulatory Visit (HOSPITAL_BASED_OUTPATIENT_CLINIC_OR_DEPARTMENT_OTHER): Payer: BC Managed Care – PPO | Admitting: Lab

## 2013-09-05 VITALS — BP 127/72 | HR 77 | Temp 97.7°F | Resp 18

## 2013-09-05 DIAGNOSIS — Z5111 Encounter for antineoplastic chemotherapy: Secondary | ICD-10-CM

## 2013-09-05 DIAGNOSIS — C187 Malignant neoplasm of sigmoid colon: Secondary | ICD-10-CM

## 2013-09-05 DIAGNOSIS — C189 Malignant neoplasm of colon, unspecified: Secondary | ICD-10-CM

## 2013-09-05 DIAGNOSIS — Z5112 Encounter for antineoplastic immunotherapy: Secondary | ICD-10-CM

## 2013-09-05 DIAGNOSIS — C787 Secondary malignant neoplasm of liver and intrahepatic bile duct: Secondary | ICD-10-CM

## 2013-09-05 LAB — CBC WITH DIFFERENTIAL/PLATELET
Basophils Absolute: 0 10*3/uL (ref 0.0–0.1)
EOS%: 3.6 % (ref 0.0–7.0)
Eosinophils Absolute: 0.1 10*3/uL (ref 0.0–0.5)
HCT: 37.5 % (ref 34.8–46.6)
HGB: 12.1 g/dL (ref 11.6–15.9)
LYMPH%: 9.9 % — ABNORMAL LOW (ref 14.0–49.7)
MCH: 28 pg (ref 25.1–34.0)
MCV: 86.5 fL (ref 79.5–101.0)
NEUT#: 3 10*3/uL (ref 1.5–6.5)
NEUT%: 73.9 % (ref 38.4–76.8)
RDW: 16.8 % — ABNORMAL HIGH (ref 11.2–14.5)
lymph#: 0.4 10*3/uL — ABNORMAL LOW (ref 0.9–3.3)

## 2013-09-05 LAB — COMPREHENSIVE METABOLIC PANEL (CC13)
ALT: 42 U/L (ref 0–55)
Albumin: 3.5 g/dL (ref 3.5–5.0)
Alkaline Phosphatase: 640 U/L — ABNORMAL HIGH (ref 40–150)
Anion Gap: 10 mEq/L (ref 3–11)
BUN: 15.3 mg/dL (ref 7.0–26.0)
CO2: 26 mEq/L (ref 22–29)
Calcium: 9.9 mg/dL (ref 8.4–10.4)
Chloride: 104 mEq/L (ref 98–109)
Glucose: 97 mg/dl (ref 70–140)
Potassium: 4.6 mEq/L (ref 3.5–5.1)
Total Bilirubin: 0.91 mg/dL (ref 0.20–1.20)

## 2013-09-05 MED ORDER — LORAZEPAM 1 MG PO TABS: ORAL_TABLET | ORAL | Status: DC

## 2013-09-05 MED ORDER — SODIUM CHLORIDE 0.9 % IJ SOLN
10.0000 mL | INTRAMUSCULAR | Status: DC | PRN
  Administered 2013-09-05: 10 mL
  Filled 2013-09-05: qty 10

## 2013-09-05 MED ORDER — SODIUM CHLORIDE 0.9 % IV SOLN
Freq: Once | INTRAVENOUS | Status: AC
  Administered 2013-09-05: 14:00:00 via INTRAVENOUS

## 2013-09-05 MED ORDER — DEXAMETHASONE SODIUM PHOSPHATE 10 MG/ML IJ SOLN
10.0000 mg | Freq: Once | INTRAMUSCULAR | Status: AC
  Administered 2013-09-05: 10 mg via INTRAVENOUS

## 2013-09-05 MED ORDER — PALONOSETRON HCL INJECTION 0.25 MG/5ML
0.2500 mg | Freq: Once | INTRAVENOUS | Status: AC
  Administered 2013-09-05: 0.25 mg via INTRAVENOUS

## 2013-09-05 MED ORDER — DEXAMETHASONE SODIUM PHOSPHATE 10 MG/ML IJ SOLN
INTRAMUSCULAR | Status: AC
  Filled 2013-09-05: qty 1

## 2013-09-05 MED ORDER — HEPARIN SOD (PORK) LOCK FLUSH 100 UNIT/ML IV SOLN
500.0000 [IU] | Freq: Once | INTRAVENOUS | Status: AC | PRN
  Administered 2013-09-05: 500 [IU]
  Filled 2013-09-05: qty 5

## 2013-09-05 MED ORDER — PALONOSETRON HCL INJECTION 0.25 MG/5ML
INTRAVENOUS | Status: AC
  Filled 2013-09-05: qty 5

## 2013-09-05 MED ORDER — LORAZEPAM 2 MG/ML IJ SOLN
INTRAMUSCULAR | Status: AC
  Filled 2013-09-05: qty 1

## 2013-09-05 MED ORDER — IRINOTECAN HCL CHEMO INJECTION 100 MG/5ML
125.0000 mg/m2 | Freq: Once | INTRAVENOUS | Status: AC
  Administered 2013-09-05: 228 mg via INTRAVENOUS
  Filled 2013-09-05: qty 11.4

## 2013-09-05 MED ORDER — LORAZEPAM 2 MG/ML IJ SOLN
0.5000 mg | Freq: Once | INTRAMUSCULAR | Status: AC
  Administered 2013-09-05: 0.5 mg via INTRAVENOUS

## 2013-09-05 MED ORDER — SODIUM CHLORIDE 0.9 % IV SOLN
6.0000 mg/kg | Freq: Once | INTRAVENOUS | Status: AC
  Administered 2013-09-05: 420 mg via INTRAVENOUS
  Filled 2013-09-05: qty 21

## 2013-09-05 NOTE — Progress Notes (Signed)
1600-Reports her " head is spinning" when she tips her head left or right or lies flat. VSS. Dr Truett Perna notified and will monitor for now.Pt laughing and interacting with other pts and staff.

## 2013-09-05 NOTE — Patient Instructions (Signed)
[facility] Discharge Instructions for Patients Receiving Chemotherapy  Today you received the following chemotherapy agents Irinotecan, vectibix To help prevent nausea and vomiting after your treatment, we encourage you to take your nausea medication  Zofran   If you develop nausea and vomiting that is not controlled by your nausea medication, call the clinic.   BELOW ARE SYMPTOMS THAT SHOULD BE REPORTED IMMEDIATELY:  *FEVER GREATER THAN 100.5 F  *CHILLS WITH OR WITHOUT FEVER  NAUSEA AND VOMITING THAT IS NOT CONTROLLED WITH YOUR NAUSEA MEDICATION  *UNUSUAL SHORTNESS OF BREATH  *UNUSUAL BRUISING OR BLEEDING  TENDERNESS IN MOUTH AND THROAT WITH OR WITHOUT PRESENCE OF ULCERS  *URINARY PROBLEMS  *BOWEL PROBLEMS  UNUSUAL RASH Items with * indicate a potential emergency and should be followed up as soon as possible.  Feel free to call the clinic you have any questions or concerns. The clinic phone number is [phone].

## 2013-09-06 ENCOUNTER — Telehealth: Payer: Self-pay | Admitting: Dietician

## 2013-09-06 LAB — CEA: CEA: 256.2 ng/mL — ABNORMAL HIGH (ref 0.0–5.0)

## 2013-09-09 ENCOUNTER — Telehealth: Payer: Self-pay | Admitting: *Deleted

## 2013-09-09 NOTE — Telephone Encounter (Signed)
Call from pt reporting dizziness and lightheadedness. Also having restlessness in her "entire body that started out as restless legs." Had nausea and vomiting Mon-Wed, seems to be resolving now. Taking Minocycline after meals now to decrease nausea. Pt denies diarrhea. Reviewed with Dr. Truett Perna: Push fluids. Pt voiced understanding. Also recommended pt take half dose of Ativan at night since restlessness is keeping her awake. She will try this.

## 2013-09-12 ENCOUNTER — Telehealth: Payer: Self-pay | Admitting: *Deleted

## 2013-09-12 NOTE — Telephone Encounter (Signed)
Thinks the reason she has been restless/dizzy of recent is due to stopping her Percocet "cold [place]". Asking for Dr. Truett Perna to advise her how to taper off the medication so she won't have such a severe reaction to not being on it any longer.

## 2013-09-13 NOTE — Telephone Encounter (Signed)
Per Dr. Truett Perna : Take Percocet every 8 hours X 3 days, then bid x 3 days, then daily X 3 days, then 1/2 tab daily X 3 days, then stop. Patient notified via Teach Back method.

## 2013-09-18 ENCOUNTER — Other Ambulatory Visit: Payer: Self-pay | Admitting: Oncology

## 2013-09-20 ENCOUNTER — Ambulatory Visit (HOSPITAL_BASED_OUTPATIENT_CLINIC_OR_DEPARTMENT_OTHER): Payer: BC Managed Care – PPO | Admitting: Nurse Practitioner

## 2013-09-20 ENCOUNTER — Telehealth: Payer: Self-pay | Admitting: Oncology

## 2013-09-20 ENCOUNTER — Other Ambulatory Visit (HOSPITAL_BASED_OUTPATIENT_CLINIC_OR_DEPARTMENT_OTHER): Payer: BC Managed Care – PPO | Admitting: Lab

## 2013-09-20 ENCOUNTER — Telehealth: Payer: Self-pay | Admitting: *Deleted

## 2013-09-20 ENCOUNTER — Ambulatory Visit (HOSPITAL_BASED_OUTPATIENT_CLINIC_OR_DEPARTMENT_OTHER): Payer: BC Managed Care – PPO

## 2013-09-20 VITALS — BP 128/69 | HR 76 | Temp 97.8°F | Resp 18 | Ht 67.0 in | Wt 148.1 lb

## 2013-09-20 DIAGNOSIS — C787 Secondary malignant neoplasm of liver and intrahepatic bile duct: Secondary | ICD-10-CM

## 2013-09-20 DIAGNOSIS — C189 Malignant neoplasm of colon, unspecified: Secondary | ICD-10-CM

## 2013-09-20 DIAGNOSIS — R110 Nausea: Secondary | ICD-10-CM

## 2013-09-20 DIAGNOSIS — C187 Malignant neoplasm of sigmoid colon: Secondary | ICD-10-CM

## 2013-09-20 DIAGNOSIS — Z5111 Encounter for antineoplastic chemotherapy: Secondary | ICD-10-CM

## 2013-09-20 DIAGNOSIS — Z5112 Encounter for antineoplastic immunotherapy: Secondary | ICD-10-CM

## 2013-09-20 DIAGNOSIS — R109 Unspecified abdominal pain: Secondary | ICD-10-CM

## 2013-09-20 LAB — COMPREHENSIVE METABOLIC PANEL (CC13)
Albumin: 3.8 g/dL (ref 3.5–5.0)
Alkaline Phosphatase: 544 U/L — ABNORMAL HIGH (ref 40–150)
Anion Gap: 9 mEq/L (ref 3–11)
BUN: 14.2 mg/dL (ref 7.0–26.0)
CO2: 28 mEq/L (ref 22–29)
Calcium: 9.9 mg/dL (ref 8.4–10.4)
Chloride: 105 mEq/L (ref 98–109)
Glucose: 101 mg/dl (ref 70–140)
Potassium: 4.1 mEq/L (ref 3.5–5.1)
Sodium: 142 mEq/L (ref 136–145)
Total Protein: 7.8 g/dL (ref 6.4–8.3)

## 2013-09-20 LAB — CBC WITH DIFFERENTIAL/PLATELET
Basophils Absolute: 0 10*3/uL (ref 0.0–0.1)
EOS%: 3.3 % (ref 0.0–7.0)
Eosinophils Absolute: 0.1 10*3/uL (ref 0.0–0.5)
HGB: 12.7 g/dL (ref 11.6–15.9)
MCH: 27.2 pg (ref 25.1–34.0)
MCHC: 32.2 g/dL (ref 31.5–36.0)
MCV: 84.5 fL (ref 79.5–101.0)
MONO#: 0.3 10*3/uL (ref 0.1–0.9)
MONO%: 11.9 % (ref 0.0–14.0)
NEUT#: 2 10*3/uL (ref 1.5–6.5)
RDW: 16.6 % — ABNORMAL HIGH (ref 11.2–14.5)
WBC: 2.9 10*3/uL — ABNORMAL LOW (ref 3.9–10.3)
lymph#: 0.4 10*3/uL — ABNORMAL LOW (ref 0.9–3.3)

## 2013-09-20 LAB — MAGNESIUM (CC13): Magnesium: 2.2 mg/dl (ref 1.5–2.5)

## 2013-09-20 MED ORDER — DEXAMETHASONE SODIUM PHOSPHATE 10 MG/ML IJ SOLN
10.0000 mg | Freq: Once | INTRAMUSCULAR | Status: AC
  Administered 2013-09-20: 10 mg via INTRAVENOUS

## 2013-09-20 MED ORDER — SODIUM CHLORIDE 0.9 % IV SOLN
6.0000 mg/kg | Freq: Once | INTRAVENOUS | Status: AC
  Administered 2013-09-20: 420 mg via INTRAVENOUS
  Filled 2013-09-20: qty 21

## 2013-09-20 MED ORDER — PALONOSETRON HCL INJECTION 0.25 MG/5ML
0.2500 mg | Freq: Once | INTRAVENOUS | Status: AC
  Administered 2013-09-20: 0.25 mg via INTRAVENOUS

## 2013-09-20 MED ORDER — DEXAMETHASONE SODIUM PHOSPHATE 10 MG/ML IJ SOLN
INTRAMUSCULAR | Status: AC
  Filled 2013-09-20: qty 1

## 2013-09-20 MED ORDER — IRINOTECAN HCL CHEMO INJECTION 100 MG/5ML
125.0000 mg/m2 | Freq: Once | INTRAVENOUS | Status: AC
  Administered 2013-09-20: 228 mg via INTRAVENOUS
  Filled 2013-09-20: qty 11.4

## 2013-09-20 MED ORDER — SODIUM CHLORIDE 0.9 % IV SOLN
Freq: Once | INTRAVENOUS | Status: AC
  Administered 2013-09-20: 13:00:00 via INTRAVENOUS

## 2013-09-20 MED ORDER — PALONOSETRON HCL INJECTION 0.25 MG/5ML
INTRAVENOUS | Status: AC
  Filled 2013-09-20: qty 5

## 2013-09-20 MED ORDER — HEPARIN SOD (PORK) LOCK FLUSH 100 UNIT/ML IV SOLN
500.0000 [IU] | Freq: Once | INTRAVENOUS | Status: AC | PRN
  Administered 2013-09-20: 500 [IU]
  Filled 2013-09-20: qty 5

## 2013-09-20 MED ORDER — SODIUM CHLORIDE 0.9 % IJ SOLN
10.0000 mL | INTRAMUSCULAR | Status: DC | PRN
  Administered 2013-09-20: 10 mL
  Filled 2013-09-20: qty 10

## 2013-09-20 MED ORDER — FLUCONAZOLE 150 MG PO TABS: 150.0000 mg | ORAL_TABLET | Freq: Every day | ORAL | Status: DC

## 2013-09-20 NOTE — Telephone Encounter (Signed)
Gave pt appt for lab,md and chemo for December 2014

## 2013-09-20 NOTE — Progress Notes (Signed)
OFFICE PROGRESS NOTE  Interval history:  Natalie Mccarthy returns for followup of metastatic colon cancer. She completed cycle 1 irinotecan/Panitumumab on 09/05/2013. She had acute nausea/vomiting while in the office. She had a single episode of nausea/vomiting the following day. She denies mouth sores. She recently had a few loose stools. Bowel habits have since returned to normal. Abdominal pain has significantly improved. She abruptly discontinued Percocet. She subsequently developed insomnia, anxiety and restless legs. She is now tapering off of the Percocet. No skin rash. She does note pruritus at the posterior scalp.   She is concerned she has a vaginal yeast infection. She is having significant pruritus. She has started Monistat cream.   Objective: Blood pressure 128/69, pulse 76, temperature 97.8 F (36.6 C), temperature source Oral, resp. rate 18, height 5\' 7"  (1.702 m), weight 148 lb 1.6 oz (67.178 kg).  Oropharynx is without thrush or ulceration. Lungs are clear. Regular cardiac rhythm. Port-A-Cath site is without erythema. Abdomen is soft with mild tenderness at the right upper quadrant. No hepatomegaly. Extremities without edema. Calves soft and nontender. Posterior lower scalp is erythematous. Single pustular skin lesion at the left abdomen.  Lab Results: Lab Results  Component Value Date   WBC 2.9* 09/20/2013   HGB 12.7 09/20/2013   HCT 39.4 09/20/2013   MCV 84.5 09/20/2013   PLT 90* 09/20/2013    Chemistry:    Chemistry      Component Value Date/Time   NA 142 09/20/2013 1052   NA 136 07/25/2013 1345   NA 143 01/27/2012 1354   K 4.1 09/20/2013 1052   K 4.3 07/25/2013 1345   K 3.8 01/27/2012 1354   CL 99 07/25/2013 1345   CL 101 04/15/2013 1230   CL 97* 01/27/2012 1354   CO2 28 09/20/2013 1052   CO2 27 07/25/2013 1345   CO2 29 01/27/2012 1354   BUN 14.2 09/20/2013 1052   BUN 12 07/25/2013 1345   BUN 11 01/27/2012 1354   CREATININE 0.8 09/20/2013 1052   CREATININE 0.71  07/25/2013 1345   CREATININE 0.9 01/27/2012 1354      Component Value Date/Time   CALCIUM 9.9 09/20/2013 1052   CALCIUM 9.9 07/25/2013 1345   CALCIUM 8.9 01/27/2012 1354   ALKPHOS 544* 09/20/2013 1052   ALKPHOS 500* 04/13/2013 0600   ALKPHOS 198* 01/27/2012 1354   AST 55* 09/20/2013 1052   AST 214* 04/13/2013 0600   AST 34 01/27/2012 1354   ALT 48 09/20/2013 1052   ALT 220* 04/13/2013 0600   ALT 41 01/27/2012 1354   BILITOT 0.81 09/20/2013 1052   BILITOT 3.9* 04/13/2013 0600   BILITOT 0.50 01/27/2012 1354       Studies/Results: No results found.  Medications: I have reviewed the patient's current medications.  Assessment/Plan:  1.Metastatic colon cancer with multiple liver metastases noted on an MRI of the abdomen 12/02/2010 and on a CT 12/13/2010. A transverse colon mass was noted on the CT from 02/11/2011 and confirmed on a colonoscopy 12/17/2010. She began treatment with FOLFIRI/Avastin 12/31/2010. A restaging CT 06/20/2011 confirmed further improvement in the metastatic liver lesions and no evidence for progressive metastatic disease. A restaging CT 08/01/2011 showed stable liver lesions and no new lesions. Restaging CT 09/12/2011 showed stable liver lesions and no new lesions. A restaging CT 10/27/2011 showed stable liver lesions and no new lesions. Restaging CT 12/05/2011 showed mild decrease in size of hepatic metastases and no evidence of disease progression. Restaging CT 01/26/2012 showed stable hepatic metastases and no  new lesions. Restaging CT may 5/8/ 2013 revealed a decreased size of hepatic metastases and no new lesions. Restaging CT 04/23/2012 revealed no change in the measurable "target" lesions, a slight increase in the size of other lesions, and no new lesions. Restaging CT 06/04/2012 showed multiple hepatic metastases were grossly unchanged. Restaging CT 07/19/2012 with stable disease in the liver. Infusional 5-fluorouracil continued on a 2 week schedule. Treatment was held  after the 08/03/2012 cycle due to thrombocytopenia. Restaging CT evaluation 08/30/2012 with mild increase in size of large hepatic metastases. Treatment was resumed with 5 fluorouracil and Avastin on 08/31/2012. Restaging CT 10/14/2012-stable hepatic metastases. Treatment continued with 5 fluorouracil and Avastin. Restaging CT 12/01/2012 revealed mild progression in the size of multiple hepatic lesions. Treatment was placed on hold. CEA 32.1 on 01/06/2013 and 71.9 on 03/11/2013. Restaging CT abdomen/pelvis 03/31/2013 with interval progression of liver metastases. She began cycle 1 CAPOX on 04/15/2013. The Xeloda was prematurely discontinued on 04/25/2013 due to redness and pain over the feet. She completed cycle 2 beginning 05/24/2013 and cycle 3 beginning on 07/05/2013. Disease progression presenting with severe abdominal pain. She began irinotecan/Panitumumab 09/05/2013. 2. Abdominal pain-? Secondary to liver metastases, metastatic adenopathy, or the primary colon tumor. Less likely peptic ulcer disease.  3. Anorexia, early satiety, and weight loss secondary to metastatic colon cancer-resolved.  4. Microcytic anemia. Resolved.  5. History of migraine headaches.  6. Status post uterine fibroid surgery.  7. Port-A-Cath placement 12/20/2010.  8. Delayed nausea following cycle 1 of FOLFIRI/Avastin. Improved with the addition of Aloxi and prophylactic Decadron beginning with cycle #2. The prophylactic Decadron was tapered. We increased the day 1 Decadron back to 20 mg.  9. Neutropenia secondary to chemotherapy. Cycle #4 was held on 02/11/2011. She subsequently received Neulasta support.  10. History of intermittent fever with no apparent source for infection, likely "tumor fever" or fever related to chemotherapy. No recent fever.  11. History of mucositis secondary to 5-FU, initially grade 2. Irinotecan and 5-FU were dose reduced per study guidelines.  12. History of abdominal cramping. ? Related to  irinotecan, 5-FU, or potentially the colon tumor. No recent abdominal cramping.  13. Thrombocytopenia secondary to chemotherapy (while being treated with irinotecan and Avastin). Treatment was held for thrombocytopenia per study guidelines. The thrombocytopenia may be related to Avastin, or chronic liver disease from chemotherapy. The CT on 04/23/2012 confirmed mild splenomegaly. The CT on 06/04/2012 again showed splenomegaly. The thrombocytopenia persisted despite being off of irinotecan and Avastin for several months. The thrombocytopenia improved. She developed thrombocytopenia following cycle 1 and cycle 2 CAPOX.  14. Acne-like rash on the face. Question related to steroids. Resolved.  15. Arthralgias-most likely unrelated to the colon cancer diagnosis and chemotherapy. She has been evaluated by orthopedics. The etiology of the arthralgias is unclear.  16. Persistent discomfort at the right shoulder. Negative CT of the right upper extremity on 07/19/2012. MRI on 08/06/2012 with a partial thickness bursal surface tear of the supraspinatus tendon, moderate rotator cuff tendinopathy/tendinosis. She is now followed by orthopedics. The pain is stable. The range of motion at the right shoulder has improved  17.? Swelling at the right upper extremity and chest wall April 2014-negative right upper extremity Doppler.  18. Delayed nausea following cycle 1 CAPOX. Emend and Aloxi were added with cycle 2.  19. Mucositis and hand-foot syndrome secondary to Xeloda. The Xeloda was dose reduced to 1000 mg twice daily for 14 days with cycle 2.  20. Thrombocytopenia following cycle 1 CAPOX.  Cycle 2 and cycle 3 were delayed due to thrombocytopenia. The oxaliplatin was further dose reduced with cycle 3.  21.New onset pruritus 04/04/2013-hyperbilirubinemia and an elevated alkaline phosphatase noted on labs 04/06/2013, the CT 03/31/2013 revealed peripheral biliary ductal dilatation in the medial segment of the left hepatic  lobe,no options for intervention after evaluation by Dr. Ewing Schlein. The hyperbilirubinemia initially resolved and was mildly elevated on 07/26/2013.  22. Acute severe upper abdominal pain, pleuritic, with radiation to the chest/shoulders beginning 07/25/2013-etiology unclear. Chest CT 07/25/2013 showed no evidence of an acute pulmonary embolus and no acute or metastatic findings in the chest. CT abdomen/pelvis on 07/26/2013 showed interval slight progression of metastatic disease to the liver; no new hepatic metastases; slight progression of intrahepatic biliary ductal dilatation; interval marked increase in the size of the spleen; interval increase in the size of a portacaval lymph node and gastrohepatic ligament lymph nodes; contracted thickwalled gallbladder possibly containing sludge. The pain is likely related to metastatic disease involving the liver with possible irritation of the diaphragm. The pain is better/well-controlled with a combination of Percocet and Motrin. The pain is significantly improved. She is tapering off of Percocet. 23. Status post biopsy of a liver lesion 08/10/2013. Pathology confirmed metastatic adenocarcinoma. Extended K-ras testing returned negative for the K-ras mutation.  24. Constipation. Likely related to narcotics. She will increase MiraLAX and Senokot-S to twice daily. She was given a prescription for sorbitol 15 cc daily as needed. Bowels now moving more regularly. 25. Probable vaginal yeast infection. She was prescribed Diflucan 150 mg x1 with instructions to repeat the dose in one week if she has persistent symptoms. 26. Insomnia, anxiety and restlessness following abrupt discontinuation of Percocet. She is now tapering off of Percocet.  Disposition-she appears improved. Abdominal pain is significantly better. Plan to proceed with cycle 2 irinotecan/Panitumumab today as scheduled. She will return for a followup visit and cycle 3 in 2 weeks. We will obtain a followup CEA  prior to cycle 4. She will contact the office before her next visit with any problems.  Plan reviewed with Dr. Truett Perna.  Lonna Cobb ANP/GNP-BC

## 2013-09-20 NOTE — Telephone Encounter (Signed)
Per staff message and POF I have scheduled appts.  JMW  

## 2013-09-20 NOTE — Patient Instructions (Addendum)
[facility] Discharge Instructions for Patients Receiving Chemotherapy  Today you received the following chemotherapy agents: camptosar and vectibix.  To help prevent nausea and vomiting after your treatment, we encourage you to take your nausea medication zofran (after 3 days) or ativan.   If you develop nausea and vomiting that is not controlled by your nausea medication, call the clinic.   BELOW ARE SYMPTOMS THAT SHOULD BE REPORTED IMMEDIATELY:  *FEVER GREATER THAN 100.5 F  *CHILLS WITH OR WITHOUT FEVER  NAUSEA AND VOMITING THAT IS NOT CONTROLLED WITH YOUR NAUSEA MEDICATION  *UNUSUAL SHORTNESS OF BREATH  *UNUSUAL BRUISING OR BLEEDING  TENDERNESS IN MOUTH AND THROAT WITH OR WITHOUT PRESENCE OF ULCERS  *URINARY PROBLEMS  *BOWEL PROBLEMS  UNUSUAL RASH Items with * indicate a potential emergency and should be followed up as soon as possible.  Feel free to call the clinic you have any questions or concerns. The clinic phone number is [phone].    

## 2013-09-27 ENCOUNTER — Telehealth: Payer: Self-pay | Admitting: *Deleted

## 2013-09-27 ENCOUNTER — Ambulatory Visit: Payer: BC Managed Care – PPO

## 2013-09-27 NOTE — Telephone Encounter (Signed)
Face is dry and flaking no matter what she uses OTC. Asking for assistance,guidance. Education nurse had sample packet provided by [employer] with CeraVe moisturizer and cleansers to try and she can then purchase them OTC if helpful. She will pick up the sample kit today to try.

## 2013-09-28 ENCOUNTER — Telehealth: Payer: Self-pay | Admitting: Oncology

## 2013-09-28 ENCOUNTER — Other Ambulatory Visit: Payer: Self-pay | Admitting: *Deleted

## 2013-09-28 NOTE — Telephone Encounter (Signed)
Gave pt appt for chemo class on 12/2

## 2013-09-28 NOTE — Progress Notes (Signed)
Patient called to request attending the chemo class again for refresher, especially since she is on new meds now.

## 2013-09-30 ENCOUNTER — Other Ambulatory Visit: Payer: Self-pay | Admitting: Oncology

## 2013-10-04 ENCOUNTER — Telehealth: Payer: Self-pay | Admitting: *Deleted

## 2013-10-04 ENCOUNTER — Telehealth: Payer: Self-pay | Admitting: Oncology

## 2013-10-04 ENCOUNTER — Ambulatory Visit (HOSPITAL_BASED_OUTPATIENT_CLINIC_OR_DEPARTMENT_OTHER): Payer: BC Managed Care – PPO

## 2013-10-04 ENCOUNTER — Other Ambulatory Visit (HOSPITAL_BASED_OUTPATIENT_CLINIC_OR_DEPARTMENT_OTHER): Payer: BC Managed Care – PPO | Admitting: Lab

## 2013-10-04 ENCOUNTER — Encounter: Payer: Self-pay | Admitting: *Deleted

## 2013-10-04 ENCOUNTER — Other Ambulatory Visit: Payer: BC Managed Care – PPO

## 2013-10-04 ENCOUNTER — Ambulatory Visit (HOSPITAL_BASED_OUTPATIENT_CLINIC_OR_DEPARTMENT_OTHER): Payer: BC Managed Care – PPO | Admitting: Oncology

## 2013-10-04 VITALS — BP 119/69 | HR 75 | Temp 96.8°F | Resp 20 | Ht 67.0 in | Wt 147.6 lb

## 2013-10-04 DIAGNOSIS — D696 Thrombocytopenia, unspecified: Secondary | ICD-10-CM

## 2013-10-04 DIAGNOSIS — C187 Malignant neoplasm of sigmoid colon: Secondary | ICD-10-CM

## 2013-10-04 DIAGNOSIS — Z5112 Encounter for antineoplastic immunotherapy: Secondary | ICD-10-CM

## 2013-10-04 DIAGNOSIS — C787 Secondary malignant neoplasm of liver and intrahepatic bile duct: Secondary | ICD-10-CM

## 2013-10-04 DIAGNOSIS — C189 Malignant neoplasm of colon, unspecified: Secondary | ICD-10-CM

## 2013-10-04 DIAGNOSIS — D709 Neutropenia, unspecified: Secondary | ICD-10-CM

## 2013-10-04 DIAGNOSIS — R109 Unspecified abdominal pain: Secondary | ICD-10-CM

## 2013-10-04 DIAGNOSIS — R21 Rash and other nonspecific skin eruption: Secondary | ICD-10-CM

## 2013-10-04 LAB — CBC WITH DIFFERENTIAL/PLATELET
BASO%: 1 % (ref 0.0–2.0)
Basophils Absolute: 0 10*3/uL (ref 0.0–0.1)
EOS%: 4.8 % (ref 0.0–7.0)
Eosinophils Absolute: 0.1 10*3/uL (ref 0.0–0.5)
HGB: 12.3 g/dL (ref 11.6–15.9)
MCH: 27.3 pg (ref 25.1–34.0)
MCHC: 32.3 g/dL (ref 31.5–36.0)
MCV: 84.7 fL (ref 79.5–101.0)
MONO%: 13 % (ref 0.0–14.0)
NEUT#: 1.3 10*3/uL — ABNORMAL LOW (ref 1.5–6.5)
RDW: 15.8 % — ABNORMAL HIGH (ref 11.2–14.5)
lymph#: 0.4 10*3/uL — ABNORMAL LOW (ref 0.9–3.3)

## 2013-10-04 LAB — COMPREHENSIVE METABOLIC PANEL (CC13)
AST: 47 U/L — ABNORMAL HIGH (ref 5–34)
Albumin: 3.8 g/dL (ref 3.5–5.0)
Alkaline Phosphatase: 387 U/L — ABNORMAL HIGH (ref 40–150)
BUN: 14.6 mg/dL (ref 7.0–26.0)
Calcium: 9.6 mg/dL (ref 8.4–10.4)
Glucose: 92 mg/dl (ref 70–140)
Potassium: 4.4 mEq/L (ref 3.5–5.1)
Sodium: 142 mEq/L (ref 136–145)
Total Bilirubin: 0.72 mg/dL (ref 0.20–1.20)

## 2013-10-04 LAB — MAGNESIUM (CC13): Magnesium: 2.1 mg/dl (ref 1.5–2.5)

## 2013-10-04 MED ORDER — SODIUM CHLORIDE 0.9 % IV SOLN
Freq: Once | INTRAVENOUS | Status: AC
  Administered 2013-10-04: 12:00:00 via INTRAVENOUS

## 2013-10-04 MED ORDER — SODIUM CHLORIDE 0.9 % IJ SOLN
10.0000 mL | INTRAMUSCULAR | Status: DC | PRN
  Administered 2013-10-04: 10 mL
  Filled 2013-10-04: qty 10

## 2013-10-04 MED ORDER — HEPARIN SOD (PORK) LOCK FLUSH 100 UNIT/ML IV SOLN
500.0000 [IU] | Freq: Once | INTRAVENOUS | Status: AC | PRN
  Administered 2013-10-04: 500 [IU]
  Filled 2013-10-04: qty 5

## 2013-10-04 MED ORDER — SODIUM CHLORIDE 0.9 % IV SOLN
400.0000 mg | Freq: Once | INTRAVENOUS | Status: AC
  Administered 2013-10-04: 400 mg via INTRAVENOUS
  Filled 2013-10-04: qty 20

## 2013-10-04 NOTE — Patient Instructions (Addendum)
[facility] Discharge Instructions for Patients Receiving Chemotherapy  Today you received the following chemotherapy agent Vectibix.  To help prevent nausea and vomiting after your treatment, we encourage you to take your nausea medication.   If you develop nausea and vomiting that is not controlled by your nausea medication, call the clinic.   BELOW ARE SYMPTOMS THAT SHOULD BE REPORTED IMMEDIATELY:  *FEVER GREATER THAN 100.5 F  *CHILLS WITH OR WITHOUT FEVER  NAUSEA AND VOMITING THAT IS NOT CONTROLLED WITH YOUR NAUSEA MEDICATION  *UNUSUAL SHORTNESS OF BREATH  *UNUSUAL BRUISING OR BLEEDING  TENDERNESS IN MOUTH AND THROAT WITH OR WITHOUT PRESENCE OF ULCERS  *URINARY PROBLEMS  *BOWEL PROBLEMS  UNUSUAL RASH Items with * indicate a potential emergency and should be followed up as soon as possible.  Feel free to call the clinic you have any questions or concerns. The clinic phone number is [phone].    

## 2013-10-04 NOTE — Progress Notes (Signed)
[facility]    OFFICE PROGRESS NOTE   INTERVAL HISTORY:   She returns for scheduled followup of metastatic colon cancer. She completed a second treatment with irinotecan/panitumumab on 09/20/2013. She developed nausea during the irinotecan infusion. She reports 1 ulcer at the left buccal mucosa. This is healing. No diarrhea. No abdominal pain. She is not taking pain medication. She complains of "dryness "of the skin, chiefly over the face and surrounding the eyes. No significant skin rash.  Objective:  Vital signs in last 24 hours:  Blood pressure 119/69, pulse 75, temperature 96.8 F (36 C), temperature source Oral, resp. rate 20, height 5\' 7"  (1.702 m), weight 147 lb 9.6 oz (66.951 kg).    HEENT: Healing ulcer at the left buccal mucosa, no thrush Resp: Lungs clear bilaterally Cardio: Regular rate and rhythm GI: No hepatomegaly, the spleen tip is palpable in the left subcostal region. Mild tenderness in the right medial subcostal area Vascular: No leg edema  Skin: Few 1-2 mm erythematous skin lesions over the face, arms, and back. Dryness over the face and arms   Portacath/PICC-without erythema  Lab Results:  Lab Results  Component Value Date   WBC 2.1* 10/04/2013   HGB 12.3 10/04/2013   HCT 38.1 10/04/2013   MCV 84.7 10/04/2013   PLT 65* 10/04/2013   ANC 1.3    Medications: I have reviewed the patient's current medications.  Assessment/Plan: .Metastatic colon cancer with multiple liver metastases noted on an MRI of the abdomen 12/02/2010 and on a CT 12/13/2010. A transverse colon mass was noted on the CT from 02/11/2011 and confirmed on a colonoscopy 12/17/2010. She began treatment with FOLFIRI/Avastin 12/31/2010. A restaging CT 06/20/2011 confirmed further improvement in the metastatic liver lesions and no evidence for progressive metastatic disease. A restaging CT 08/01/2011 showed stable liver lesions and no new lesions. Restaging CT 09/12/2011 showed  stable liver lesions and no new lesions. A restaging CT 10/27/2011 showed stable liver lesions and no new lesions. Restaging CT 12/05/2011 showed mild decrease in size of hepatic metastases and no evidence of disease progression. Restaging CT 01/26/2012 showed stable hepatic metastases and no new lesions. Restaging CT may 5/8/ 2013 revealed a decreased size of hepatic metastases and no new lesions. Restaging CT 04/23/2012 revealed no change in the measurable "target" lesions, a slight increase in the size of other lesions, and no new lesions. Restaging CT 06/04/2012 showed multiple hepatic metastases were grossly unchanged. Restaging CT 07/19/2012 with stable disease in the liver. Infusional 5-fluorouracil continued on a 2 week schedule. Treatment was held after the 08/03/2012 cycle due to thrombocytopenia. Restaging CT evaluation 08/30/2012 with mild increase in size of large hepatic metastases. Treatment was resumed with 5 fluorouracil and Avastin on 08/31/2012. Restaging CT 10/14/2012-stable hepatic metastases. Treatment continued with 5 fluorouracil and Avastin. Restaging CT 12/01/2012 revealed mild progression in the size of multiple hepatic lesions. Treatment was placed on hold. CEA 32.1 on 01/06/2013 and 71.9 on 03/11/2013. Restaging CT abdomen/pelvis 03/31/2013 with interval progression of liver metastases. She began cycle 1 CAPOX on 04/15/2013. The Xeloda was prematurely discontinued on 04/25/2013 due to redness and pain over the feet. She completed cycle 2 beginning 05/24/2013 and cycle 3 beginning on 07/05/2013. Disease progression presenting with severe abdominal pain. She began irinotecan/Panitumumab 09/05/2013.  2. Abdominal pain-? Secondary to liver metastases, metastatic adenopathy, or the primary colon tumor. Less likely peptic ulcer disease.  3. Anorexia, early satiety, and weight loss secondary to metastatic colon cancer-resolved.  4. Microcytic  anemia. Resolved.  5. History of migraine  headaches.  6. Status post uterine fibroid surgery.  7. Port-A-Cath placement 12/20/2010.  8. Delayed nausea following cycle 1 of FOLFIRI/Avastin. Improved with the addition of Aloxi and prophylactic Decadron beginning with cycle #2. The prophylactic Decadron was tapered. We increased the day 1 Decadron back to 20 mg.  9. Neutropenia secondary to chemotherapy. Cycle #4 was held on 02/11/2011. She subsequently received Neulasta support.  10. History of intermittent fever with no apparent source for infection, likely "tumor fever" or fever related to chemotherapy. No recent fever.  11. History of mucositis secondary to 5-FU, initially grade 2. Irinotecan and 5-FU were dose reduced per study guidelines.  12. History of abdominal cramping. ? Related to irinotecan, 5-FU, or potentially the colon tumor. No recent abdominal cramping.  13. Thrombocytopenia secondary to chemotherapy (while being treated with irinotecan and Avastin). Treatment was held for thrombocytopenia per study guidelines. The thrombocytopenia may be related to Avastin, or chronic liver disease from chemotherapy. The CT on 04/23/2012 confirmed mild splenomegaly. The CT on 06/04/2012 again showed splenomegaly. The thrombocytopenia persisted despite being off of irinotecan and Avastin for several months. The thrombocytopenia improved. She developed thrombocytopenia following cycle 1 and cycle 2 CAPOX. She again has thrombocytopenia. 14. Acne-like rash on the face. Question related to steroids. Resolved.  15. Arthralgias-most likely unrelated to the colon cancer diagnosis and chemotherapy. She has been evaluated by orthopedics. The etiology of the arthralgias is unclear.  16. Persistent discomfort at the right shoulder. Negative CT of the right upper extremity on 07/19/2012. MRI on 08/06/2012 with a partial thickness bursal surface tear of the supraspinatus tendon, moderate rotator cuff tendinopathy/tendinosis. She is now followed by orthopedics.  The pain is stable. The range of motion at the right shoulder has improved  17.? Swelling at the right upper extremity and chest wall April 2014-negative right upper extremity Doppler.  18. Delayed nausea following cycle 1 CAPOX. Emend and Aloxi were added with cycle 2.  19. Mucositis and hand-foot syndrome secondary to Xeloda. The Xeloda was dose reduced to 1000 mg twice daily for 14 days with cycle 2.  20. Thrombocytopenia following cycle 1 CAPOX. Cycle 2 and cycle 3 were delayed due to thrombocytopenia. The oxaliplatin was further dose reduced with cycle 3.  21.New onset pruritus 04/04/2013-hyperbilirubinemia and an elevated alkaline phosphatase noted on labs 04/06/2013, the CT 03/31/2013 revealed peripheral biliary ductal dilatation in the medial segment of the left hepatic lobe,no options for intervention after evaluation by Dr. Ewing Schlein. The hyperbilirubinemia initially resolved and was mildly elevated on 07/26/2013.  22. Acute severe upper abdominal pain, pleuritic, with radiation to the chest/shoulders beginning 07/25/2013-etiology unclear. Chest CT 07/25/2013 showed no evidence of an acute pulmonary embolus and no acute or metastatic findings in the chest. CT abdomen/pelvis on 07/26/2013 showed interval slight progression of metastatic disease to the liver; no new hepatic metastases; slight progression of intrahepatic biliary ductal dilatation; interval marked increase in the size of the spleen; interval increase in the size of a portacaval lymph node and gastrohepatic ligament lymph nodes; contracted thickwalled gallbladder possibly containing sludge. The pain is likely related to metastatic disease involving the liver with possible irritation of the diaphragm. The pain has resolved. 23. Status post biopsy of a liver lesion 08/10/2013. Pathology confirmed metastatic adenocarcinoma. Extended K-ras testing returned negative for the K-ras mutation.  24. Constipation. Likely related to narcotics. She will  increase MiraLAX and Senokot-S to twice daily. She was given a prescription for sorbitol 15  cc daily as needed. Bowels now moving more regularly.  25. Panitumumab skin rash-mild, she will continue minocycline and moisturizer 26. Insomnia, anxiety and restlessness following abrupt discontinuation of Percocet. Resolved after a slow Percocet taper.   Disposition:  She appears to be tolerating the irinotecan and panitumumab well. Her abdominal pain has resolved. She has developed mild neutropenia and progressive thrombocytopenia. The irinotecan will be held with treatment today. She will complete a third treatment with panitumumab. Ms. Bow will return for an office visit and chemotherapy in 2 weeks. We will check a CEA after 4 treatments.   Thornton Papas, MD  10/04/2013  11:45 AM

## 2013-10-04 NOTE — Telephone Encounter (Signed)
Gave pt appt for labs,md and chemo for December 2014

## 2013-10-04 NOTE — Telephone Encounter (Signed)
Per staff message and POF I have scheduled appts.  JMW  

## 2013-10-16 ENCOUNTER — Other Ambulatory Visit: Payer: Self-pay | Admitting: Oncology

## 2013-10-18 ENCOUNTER — Telehealth: Payer: Self-pay | Admitting: Oncology

## 2013-10-18 ENCOUNTER — Ambulatory Visit (HOSPITAL_BASED_OUTPATIENT_CLINIC_OR_DEPARTMENT_OTHER): Payer: BC Managed Care – PPO | Admitting: Oncology

## 2013-10-18 ENCOUNTER — Ambulatory Visit (HOSPITAL_BASED_OUTPATIENT_CLINIC_OR_DEPARTMENT_OTHER): Payer: BC Managed Care – PPO

## 2013-10-18 ENCOUNTER — Other Ambulatory Visit (HOSPITAL_BASED_OUTPATIENT_CLINIC_OR_DEPARTMENT_OTHER): Payer: BC Managed Care – PPO

## 2013-10-18 ENCOUNTER — Encounter: Payer: Self-pay | Admitting: *Deleted

## 2013-10-18 VITALS — BP 129/70 | HR 76 | Temp 98.0°F | Resp 18 | Ht 67.0 in | Wt 147.8 lb

## 2013-10-18 DIAGNOSIS — C787 Secondary malignant neoplasm of liver and intrahepatic bile duct: Secondary | ICD-10-CM

## 2013-10-18 DIAGNOSIS — C189 Malignant neoplasm of colon, unspecified: Secondary | ICD-10-CM

## 2013-10-18 DIAGNOSIS — R109 Unspecified abdominal pain: Secondary | ICD-10-CM

## 2013-10-18 DIAGNOSIS — Z5112 Encounter for antineoplastic immunotherapy: Secondary | ICD-10-CM

## 2013-10-18 DIAGNOSIS — C187 Malignant neoplasm of sigmoid colon: Secondary | ICD-10-CM

## 2013-10-18 DIAGNOSIS — Z5111 Encounter for antineoplastic chemotherapy: Secondary | ICD-10-CM

## 2013-10-18 DIAGNOSIS — M2550 Pain in unspecified joint: Secondary | ICD-10-CM

## 2013-10-18 DIAGNOSIS — K1370 Unspecified lesions of oral mucosa: Secondary | ICD-10-CM

## 2013-10-18 DIAGNOSIS — R161 Splenomegaly, not elsewhere classified: Secondary | ICD-10-CM

## 2013-10-18 DIAGNOSIS — R21 Rash and other nonspecific skin eruption: Secondary | ICD-10-CM

## 2013-10-18 DIAGNOSIS — D696 Thrombocytopenia, unspecified: Secondary | ICD-10-CM

## 2013-10-18 LAB — CBC WITH DIFFERENTIAL/PLATELET
Eosinophils Absolute: 0.1 10*3/uL (ref 0.0–0.5)
HCT: 39.8 % (ref 34.8–46.6)
HGB: 13.1 g/dL (ref 11.6–15.9)
MCV: 83.3 fL (ref 79.5–101.0)
MONO#: 0.3 10*3/uL (ref 0.1–0.9)
NEUT#: 1.8 10*3/uL (ref 1.5–6.5)
NEUT%: 67.4 % (ref 38.4–76.8)
Platelets: 69 10*3/uL — ABNORMAL LOW (ref 145–400)
RBC: 4.77 10*6/uL (ref 3.70–5.45)
RDW: 18.1 % — ABNORMAL HIGH (ref 11.2–14.5)
WBC: 2.7 10*3/uL — ABNORMAL LOW (ref 3.9–10.3)
lymph#: 0.4 10*3/uL — ABNORMAL LOW (ref 0.9–3.3)

## 2013-10-18 LAB — COMPREHENSIVE METABOLIC PANEL (CC13)
ALT: 31 U/L (ref 0–55)
Albumin: 3.6 g/dL (ref 3.5–5.0)
Alkaline Phosphatase: 326 U/L — ABNORMAL HIGH (ref 40–150)
Anion Gap: 8 mEq/L (ref 3–11)
CO2: 25 mEq/L (ref 22–29)
Creatinine: 0.7 mg/dL (ref 0.6–1.1)
Glucose: 124 mg/dl (ref 70–140)
Potassium: 4 mEq/L (ref 3.5–5.1)
Sodium: 142 mEq/L (ref 136–145)
Total Bilirubin: 0.81 mg/dL (ref 0.20–1.20)
Total Protein: 7.1 g/dL (ref 6.4–8.3)

## 2013-10-18 LAB — MAGNESIUM (CC13): Magnesium: 1.9 mg/dl (ref 1.5–2.5)

## 2013-10-18 MED ORDER — SODIUM CHLORIDE 0.9 % IV SOLN
5.8000 mg/kg | Freq: Once | INTRAVENOUS | Status: AC
  Administered 2013-10-18: 400 mg via INTRAVENOUS
  Filled 2013-10-18: qty 20

## 2013-10-18 MED ORDER — PALONOSETRON HCL INJECTION 0.25 MG/5ML
0.2500 mg | Freq: Once | INTRAVENOUS | Status: AC
  Administered 2013-10-18: 0.25 mg via INTRAVENOUS

## 2013-10-18 MED ORDER — SODIUM CHLORIDE 0.9 % IJ SOLN
10.0000 mL | INTRAMUSCULAR | Status: DC | PRN
  Administered 2013-10-18: 10 mL
  Filled 2013-10-18: qty 10

## 2013-10-18 MED ORDER — IRINOTECAN HCL CHEMO INJECTION 100 MG/5ML
99.0000 mg/m2 | Freq: Once | INTRAVENOUS | Status: AC
  Administered 2013-10-18: 180 mg via INTRAVENOUS
  Filled 2013-10-18: qty 9

## 2013-10-18 MED ORDER — SODIUM CHLORIDE 0.9 % IV SOLN
Freq: Once | INTRAVENOUS | Status: AC
  Administered 2013-10-18: 11:00:00 via INTRAVENOUS

## 2013-10-18 MED ORDER — DEXAMETHASONE SODIUM PHOSPHATE 10 MG/ML IJ SOLN
INTRAMUSCULAR | Status: AC
  Filled 2013-10-18: qty 1

## 2013-10-18 MED ORDER — HEPARIN SOD (PORK) LOCK FLUSH 100 UNIT/ML IV SOLN
500.0000 [IU] | Freq: Once | INTRAVENOUS | Status: AC | PRN
  Administered 2013-10-18: 500 [IU]
  Filled 2013-10-18: qty 5

## 2013-10-18 MED ORDER — DEXAMETHASONE SODIUM PHOSPHATE 10 MG/ML IJ SOLN
10.0000 mg | Freq: Once | INTRAMUSCULAR | Status: AC
  Administered 2013-10-18: 10 mg via INTRAVENOUS

## 2013-10-18 MED ORDER — PALONOSETRON HCL INJECTION 0.25 MG/5ML
INTRAVENOUS | Status: AC
  Filled 2013-10-18: qty 5

## 2013-10-18 NOTE — Progress Notes (Signed)
[facility]    OFFICE PROGRESS NOTE   INTERVAL HISTORY:   She returns for scheduled followup of metastatic colon cancer. She completed a treatment with panitumumab on 09/20/2013. She reports skin dryness and a mild rash. No abnormal pain. She has developed a "sore "at the left side of her mouth.  Objective:  Vital signs in last 24 hours:  Blood pressure 129/70, pulse 76, temperature 98 F (36.7 C), temperature source Oral, resp. rate 18, height 5\' 7"  (1.702 m), weight 147 lb 12.8 oz (67.042 kg).    HEENT: No thrush. Left buccal ulcer Resp: Lungs clear bilaterally Cardio: Regular rate and rhythm GI: No hepatomegaly, nontender Vascular: No leg edema  Skin: Few acne-type lesions at the upper chest and back   Portacath/PICC-without erythema  Lab Results:  Lab Results  Component Value Date   WBC 2.7* 10/18/2013   HGB 13.1 10/18/2013   HCT 39.8 10/18/2013   MCV 83.3 10/18/2013   PLT 69* 10/18/2013   ANC 1.8    Medications: I have reviewed the patient's current medications.  Assessment/Plan: 1.Metastatic colon cancer with multiple liver metastases noted on an MRI of the abdomen 12/02/2010 and on a CT 12/13/2010. A transverse colon mass was noted on the CT from 02/11/2011 and confirmed on a colonoscopy 12/17/2010. She began treatment with FOLFIRI/Avastin 12/31/2010. A restaging CT 06/20/2011 confirmed further improvement in the metastatic liver lesions and no evidence for progressive metastatic disease. A restaging CT 08/01/2011 showed stable liver lesions and no new lesions. Restaging CT 09/12/2011 showed stable liver lesions and no new lesions. A restaging CT 10/27/2011 showed stable liver lesions and no new lesions. Restaging CT 12/05/2011 showed mild decrease in size of hepatic metastases and no evidence of disease progression. Restaging CT 01/26/2012 showed stable hepatic metastases and no new lesions. Restaging CT may 5/8/ 2013 revealed a decreased size of  hepatic metastases and no new lesions. Restaging CT 04/23/2012 revealed no change in the measurable "target" lesions, a slight increase in the size of other lesions, and no new lesions. Restaging CT 06/04/2012 showed multiple hepatic metastases were grossly unchanged. Restaging CT 07/19/2012 with stable disease in the liver. Infusional 5-fluorouracil continued on a 2 week schedule. Treatment was held after the 08/03/2012 cycle due to thrombocytopenia. Restaging CT evaluation 08/30/2012 with mild increase in size of large hepatic metastases. Treatment was resumed with 5 fluorouracil and Avastin on 08/31/2012. Restaging CT 10/14/2012-stable hepatic metastases. Treatment continued with 5 fluorouracil and Avastin. Restaging CT 12/01/2012 revealed mild progression in the size of multiple hepatic lesions. Treatment was placed on hold. CEA 32.1 on 01/06/2013 and 71.9 on 03/11/2013. Restaging CT abdomen/pelvis 03/31/2013 with interval progression of liver metastases. She began cycle 1 CAPOX on 04/15/2013. The Xeloda was prematurely discontinued on 04/25/2013 due to redness and pain over the feet. She completed cycle 2 beginning 05/24/2013 and cycle 3 beginning on 07/05/2013. Disease progression presenting with severe abdominal pain. She began irinotecan/Panitumumab 09/05/2013.  2. Abdominal pain-? Secondary to liver metastases, metastatic adenopathy, or the primary colon tumor. Less likely peptic ulcer disease.  3. Anorexia, early satiety, and weight loss secondary to metastatic colon cancer-resolved.  4. Microcytic anemia. Resolved.  5. History of migraine headaches.  6. Status post uterine fibroid surgery.  7. Port-A-Cath placement 12/20/2010.  8. Delayed nausea following cycle 1 of FOLFIRI/Avastin. Improved with the addition of Aloxi and prophylactic Decadron beginning with cycle #2. The prophylactic Decadron was tapered. We increased the day 1 Decadron back to 20 mg.  9. Neutropenia secondary to chemotherapy.  Cycle #4 was held on 02/11/2011. She subsequently received Neulasta support.  10. History of intermittent fever with no apparent source for infection, likely "tumor fever" or fever related to chemotherapy. No recent fever.  11. History of mucositis secondary to 5-FU, initially grade 2. Irinotecan and 5-FU were dose reduced per study guidelines.  12. History of abdominal cramping. ? Related to irinotecan, 5-FU, or potentially the colon tumor. No recent abdominal cramping.  13. Thrombocytopenia secondary to chemotherapy (while being treated with irinotecan and Avastin). Treatment was held for thrombocytopenia per study guidelines. The thrombocytopenia may be related to Avastin, or chronic liver disease from chemotherapy. The CT on 04/23/2012 confirmed mild splenomegaly. The CT on 06/04/2012 again showed splenomegaly. The thrombocytopenia persisted despite being off of irinotecan and Avastin for several months. The thrombocytopenia improved. She developed thrombocytopenia following cycle 1 and cycle 2 CAPOX. She again has thrombocytopenia.  14. Acne-like rash on the face. Question related to steroids. Resolved.  15. Arthralgias-most likely unrelated to the colon cancer diagnosis and chemotherapy. She has been evaluated by orthopedics. The etiology of the arthralgias is unclear.  16. Persistent discomfort at the right shoulder. Negative CT of the right upper extremity on 07/19/2012. MRI on 08/06/2012 with a partial thickness bursal surface tear of the supraspinatus tendon, moderate rotator cuff tendinopathy/tendinosis. She is now followed by orthopedics. The pain is stable. The range of motion at the right shoulder has improved  17.? Swelling at the right upper extremity and chest wall April 2014-negative right upper extremity Doppler.  18. Delayed nausea following cycle 1 CAPOX. Emend and Aloxi were added with cycle 2.  19. Mucositis and hand-foot syndrome secondary to Xeloda. The Xeloda was dose reduced to  1000 mg twice daily for 14 days with cycle 2.  20. Thrombocytopenia following cycle 1 CAPOX. Cycle 2 and cycle 3 were delayed due to thrombocytopenia. The oxaliplatin was further dose reduced with cycle 3.  21.New onset pruritus 04/04/2013-hyperbilirubinemia and an elevated alkaline phosphatase noted on labs 04/06/2013, the CT 03/31/2013 revealed peripheral biliary ductal dilatation in the medial segment of the left hepatic lobe,no options for intervention after evaluation by Dr. Ewing Schlein. The hyperbilirubinemia initially resolved and was mildly elevated on 07/26/2013.  22. Acute severe upper abdominal pain, pleuritic, with radiation to the chest/shoulders beginning 07/25/2013-etiology unclear. Chest CT 07/25/2013 showed no evidence of an acute pulmonary embolus and no acute or metastatic findings in the chest. CT abdomen/pelvis on 07/26/2013 showed interval slight progression of metastatic disease to the liver; no new hepatic metastases; slight progression of intrahepatic biliary ductal dilatation; interval marked increase in the size of the spleen; interval increase in the size of a portacaval lymph node and gastrohepatic ligament lymph nodes; contracted thickwalled gallbladder possibly containing sludge. The pain is likely related to metastatic disease involving the liver with possible irritation of the diaphragm. The pain has resolved.  23. Status post biopsy of a liver lesion 08/10/2013. Pathology confirmed metastatic adenocarcinoma. Extended K-ras testing returned negative for the K-ras mutation.  24. Constipation. Likely related to narcotics. She will increase MiraLAX and Senokot-S to twice daily. She was given a prescription for sorbitol 15 cc daily as needed. Bowels now moving more regularly.  25. Panitumumab skin rash-mild, she will continue minocycline and moisturizer  26. Insomnia, anxiety and restlessness following abrupt discontinuation of Percocet. Resolved after a slow Percocet taper.  27. Left  buccal ulcer-likely secondary to chemotherapy    Disposition:  Her overall performance status is stable.  The platelets are slightly higher today. The thrombocytopenia is most likely related to chronic liver disease/splenomegaly from chemotherapy and ongoing chemotherapy. We decided to proceed with irinotecan/panitumumab today. We will check a CEA when she returns in 2 weeks. She knows to contact us for spontaneous bleeding or bruising. She will return for a nadir CBC next week.   Thornton Papas, MD  10/18/2013  3:48 PM

## 2013-10-18 NOTE — Progress Notes (Signed)
Per Dr. Truett Perna: OK to treat despite PLT 69.

## 2013-10-18 NOTE — Patient Instructions (Signed)
[facility] Discharge Instructions for Patients Receiving Chemotherapy  Today you received the following chemotherapy agents: camptosar and vectibix.  To help prevent nausea and vomiting after your treatment, we encourage you to take your nausea medication zofran (after 3 days) or ativan.   If you develop nausea and vomiting that is not controlled by your nausea medication, call the clinic.   BELOW ARE SYMPTOMS THAT SHOULD BE REPORTED IMMEDIATELY:  *FEVER GREATER THAN 100.5 F  *CHILLS WITH OR WITHOUT FEVER  NAUSEA AND VOMITING THAT IS NOT CONTROLLED WITH YOUR NAUSEA MEDICATION  *UNUSUAL SHORTNESS OF BREATH  *UNUSUAL BRUISING OR BLEEDING  TENDERNESS IN MOUTH AND THROAT WITH OR WITHOUT PRESENCE OF ULCERS  *URINARY PROBLEMS  *BOWEL PROBLEMS  UNUSUAL RASH Items with * indicate a potential emergency and should be followed up as soon as possible.  Feel free to call the clinic you have any questions or concerns. The clinic phone number is [phone].

## 2013-10-18 NOTE — Progress Notes (Signed)
Chaplain made initial visit. Pt was crocheting a baby blanket and shared about [employer], a charity to which she donates the blankets. Pt stated that making them helps her feel productive even when she's having chemo. Pt said that she feels bad for two days after chemo, and that she has chemo every two weeks. Pt stated that "some people have it worse," so she's grateful to just have two hard days. Chaplain affirmed her positive attitude. Chaplain will follow up as necessary.

## 2013-10-18 NOTE — Telephone Encounter (Signed)
gv pt appt schedule for december thru january.

## 2013-10-19 ENCOUNTER — Other Ambulatory Visit: Payer: Self-pay | Admitting: *Deleted

## 2013-10-19 DIAGNOSIS — C189 Malignant neoplasm of colon, unspecified: Secondary | ICD-10-CM

## 2013-10-19 MED ORDER — LIDOCAINE-PRILOCAINE 2.5-2.5 % EX CREA: TOPICAL_CREAM | CUTANEOUS | Status: DC

## 2013-10-25 ENCOUNTER — Other Ambulatory Visit (HOSPITAL_BASED_OUTPATIENT_CLINIC_OR_DEPARTMENT_OTHER): Payer: BC Managed Care – PPO

## 2013-10-25 ENCOUNTER — Telehealth: Payer: Self-pay | Admitting: *Deleted

## 2013-10-25 DIAGNOSIS — C187 Malignant neoplasm of sigmoid colon: Secondary | ICD-10-CM

## 2013-10-25 DIAGNOSIS — C189 Malignant neoplasm of colon, unspecified: Secondary | ICD-10-CM

## 2013-10-25 DIAGNOSIS — C787 Secondary malignant neoplasm of liver and intrahepatic bile duct: Secondary | ICD-10-CM

## 2013-10-25 LAB — CBC WITH DIFFERENTIAL/PLATELET
Basophils Absolute: 0 10*3/uL (ref 0.0–0.1)
EOS%: 3.8 % (ref 0.0–7.0)
HCT: 36.2 % (ref 34.8–46.6)
HGB: 11.8 g/dL (ref 11.6–15.9)
MCH: 27 pg (ref 25.1–34.0)
MCHC: 32.6 g/dL (ref 31.5–36.0)
MCV: 82.9 fL (ref 79.5–101.0)
NEUT%: 62.3 % (ref 38.4–76.8)
lymph#: 0.4 10*3/uL — ABNORMAL LOW (ref 0.9–3.3)

## 2013-10-25 NOTE — Telephone Encounter (Signed)
Notified patient of CBC results per her request.

## 2013-10-29 ENCOUNTER — Other Ambulatory Visit: Payer: Self-pay | Admitting: Oncology

## 2013-11-01 ENCOUNTER — Ambulatory Visit (HOSPITAL_BASED_OUTPATIENT_CLINIC_OR_DEPARTMENT_OTHER): Payer: BC Managed Care – PPO

## 2013-11-01 ENCOUNTER — Ambulatory Visit (HOSPITAL_BASED_OUTPATIENT_CLINIC_OR_DEPARTMENT_OTHER): Payer: BC Managed Care – PPO | Admitting: Nurse Practitioner

## 2013-11-01 ENCOUNTER — Other Ambulatory Visit (HOSPITAL_BASED_OUTPATIENT_CLINIC_OR_DEPARTMENT_OTHER): Payer: BC Managed Care – PPO

## 2013-11-01 ENCOUNTER — Telehealth: Payer: Self-pay | Admitting: Oncology

## 2013-11-01 VITALS — BP 120/67 | HR 79 | Temp 97.0°F | Resp 18 | Ht 67.0 in | Wt 147.8 lb

## 2013-11-01 DIAGNOSIS — C787 Secondary malignant neoplasm of liver and intrahepatic bile duct: Secondary | ICD-10-CM

## 2013-11-01 DIAGNOSIS — Z5111 Encounter for antineoplastic chemotherapy: Secondary | ICD-10-CM

## 2013-11-01 DIAGNOSIS — R109 Unspecified abdominal pain: Secondary | ICD-10-CM

## 2013-11-01 DIAGNOSIS — Z5112 Encounter for antineoplastic immunotherapy: Secondary | ICD-10-CM

## 2013-11-01 DIAGNOSIS — C187 Malignant neoplasm of sigmoid colon: Secondary | ICD-10-CM

## 2013-11-01 DIAGNOSIS — C189 Malignant neoplasm of colon, unspecified: Secondary | ICD-10-CM

## 2013-11-01 DIAGNOSIS — M2550 Pain in unspecified joint: Secondary | ICD-10-CM

## 2013-11-01 DIAGNOSIS — L539 Erythematous condition, unspecified: Secondary | ICD-10-CM

## 2013-11-01 LAB — CBC WITH DIFFERENTIAL/PLATELET
Basophils Absolute: 0 10*3/uL (ref 0.0–0.1)
EOS%: 3.1 % (ref 0.0–7.0)
HGB: 11.9 g/dL (ref 11.6–15.9)
LYMPH%: 19.5 % (ref 14.0–49.7)
MCH: 26.6 pg (ref 25.1–34.0)
MCV: 82.7 fL (ref 79.5–101.0)
MONO%: 12 % (ref 0.0–14.0)
NEUT#: 1.3 10*3/uL — ABNORMAL LOW (ref 1.5–6.5)
NEUT%: 64.4 % (ref 38.4–76.8)
Platelets: 87 10*3/uL — ABNORMAL LOW (ref 145–400)
RDW: 17.8 % — ABNORMAL HIGH (ref 11.2–14.5)

## 2013-11-01 LAB — COMPREHENSIVE METABOLIC PANEL (CC13)
ALT: 34 U/L (ref 0–55)
AST: 33 U/L (ref 5–34)
Albumin: 3.5 g/dL (ref 3.5–5.0)
Alkaline Phosphatase: 395 U/L — ABNORMAL HIGH (ref 40–150)
BUN: 13.1 mg/dL (ref 7.0–26.0)
Chloride: 106 mEq/L (ref 98–109)
Creatinine: 0.7 mg/dL (ref 0.6–1.1)
Potassium: 4.4 mEq/L (ref 3.5–5.1)
Total Bilirubin: 0.49 mg/dL (ref 0.20–1.20)

## 2013-11-01 LAB — MAGNESIUM (CC13): Magnesium: 1.9 mg/dl (ref 1.5–2.5)

## 2013-11-01 MED ORDER — PALONOSETRON HCL INJECTION 0.25 MG/5ML
0.2500 mg | Freq: Once | INTRAVENOUS | Status: AC
  Administered 2013-11-01: 0.25 mg via INTRAVENOUS

## 2013-11-01 MED ORDER — SODIUM CHLORIDE 0.9 % IV SOLN
5.8000 mg/kg | Freq: Once | INTRAVENOUS | Status: AC
  Administered 2013-11-01: 400 mg via INTRAVENOUS
  Filled 2013-11-01: qty 20

## 2013-11-01 MED ORDER — SODIUM CHLORIDE 0.9 % IJ SOLN
10.0000 mL | INTRAMUSCULAR | Status: DC | PRN
  Administered 2013-11-01: 10 mL
  Filled 2013-11-01: qty 10

## 2013-11-01 MED ORDER — HEPARIN SOD (PORK) LOCK FLUSH 100 UNIT/ML IV SOLN
500.0000 [IU] | Freq: Once | INTRAVENOUS | Status: AC | PRN
  Administered 2013-11-01: 500 [IU]
  Filled 2013-11-01: qty 5

## 2013-11-01 MED ORDER — LORAZEPAM 2 MG/ML IJ SOLN
0.5000 mg | Freq: Once | INTRAMUSCULAR | Status: AC
  Administered 2013-11-01: 0.5 mg via INTRAVENOUS

## 2013-11-01 MED ORDER — DEXTROSE 5 % IV SOLN
99.0000 mg/m2 | Freq: Once | INTRAVENOUS | Status: AC
  Administered 2013-11-01: 180 mg via INTRAVENOUS
  Filled 2013-11-01: qty 9

## 2013-11-01 MED ORDER — PALONOSETRON HCL INJECTION 0.25 MG/5ML
INTRAVENOUS | Status: AC
  Filled 2013-11-01: qty 5

## 2013-11-01 MED ORDER — DEXAMETHASONE SODIUM PHOSPHATE 10 MG/ML IJ SOLN
INTRAMUSCULAR | Status: AC
  Filled 2013-11-01: qty 1

## 2013-11-01 MED ORDER — SODIUM CHLORIDE 0.9 % IV SOLN
Freq: Once | INTRAVENOUS | Status: AC
  Administered 2013-11-01: 14:00:00 via INTRAVENOUS

## 2013-11-01 MED ORDER — DEXAMETHASONE SODIUM PHOSPHATE 10 MG/ML IJ SOLN
10.0000 mg | Freq: Once | INTRAMUSCULAR | Status: AC
  Administered 2013-11-01: 10 mg via INTRAVENOUS

## 2013-11-01 MED ORDER — LORAZEPAM 2 MG/ML IJ SOLN
INTRAMUSCULAR | Status: AC
  Filled 2013-11-01: qty 1

## 2013-11-01 NOTE — Progress Notes (Addendum)
OFFICE PROGRESS NOTE  Interval history:  Natalie Mccarthy returns for followup of metastatic colon cancer. She completed cycle 3 irinotecan/Panitumumab on 10/18/2013. She consistently develops significant nausea during the irinotecan infusion. The nausea resolves over about 24 hours. She denies diarrhea. No sweats. No abdominal cramping. She has a single mouth sore that "comes and goes". She notes her skin is dry. No skin rash. She has noted redness over the Port-A-Cath site the past 2 times she has applied the numbing cream.   Objective: Filed Vitals:   11/01/13 1223  BP: 120/67  Pulse: 79  Temp: 97 F (36.1 C)  Resp: 18    Ulceration left buccal mucosa. No thrush. Lungs clear. Regular cardiac rhythm. Skin overlying the Port-A-Cath site is erythematous, nontender. Abdomen soft and nontender. No hepatomegaly. No leg edema. No skin rash.  Lab Results: Lab Results  Component Value Date   WBC 2.0* 11/01/2013   HGB 11.9 11/01/2013   HCT 37.0 11/01/2013   MCV 82.7 11/01/2013   PLT 87* 11/01/2013   NEUTROABS 1.3* 11/01/2013    Chemistry:    Chemistry      Component Value Date/Time   NA 142 11/01/2013 1207   NA 136 07/25/2013 1345   NA 143 01/27/2012 1354   K 4.4 11/01/2013 1207   K 4.3 07/25/2013 1345   K 3.8 01/27/2012 1354   CL 99 07/25/2013 1345   CL 101 04/15/2013 1230   CL 97* 01/27/2012 1354   CO2 26 11/01/2013 1207   CO2 27 07/25/2013 1345   CO2 29 01/27/2012 1354   BUN 13.1 11/01/2013 1207   BUN 12 07/25/2013 1345   BUN 11 01/27/2012 1354   CREATININE 0.7 11/01/2013 1207   CREATININE 0.71 07/25/2013 1345   CREATININE 0.9 01/27/2012 1354      Component Value Date/Time   CALCIUM 9.4 11/01/2013 1207   CALCIUM 9.9 07/25/2013 1345   CALCIUM 8.9 01/27/2012 1354   ALKPHOS 395* 11/01/2013 1207   ALKPHOS 500* 04/13/2013 0600   ALKPHOS 198* 01/27/2012 1354   AST 33 11/01/2013 1207   AST 214* 04/13/2013 0600   AST 34 01/27/2012 1354   ALT 34 11/01/2013 1207   ALT 220* 04/13/2013 0600    ALT 41 01/27/2012 1354   BILITOT 0.49 11/01/2013 1207   BILITOT 3.9* 04/13/2013 0600   BILITOT 0.50 01/27/2012 1354       Studies/Results: No results found.  Medications: I have reviewed the patient's current medications.  Assessment/Plan: 1.Metastatic colon cancer with multiple liver metastases noted on an MRI of the abdomen 12/02/2010 and on a CT 12/13/2010. A transverse colon mass was noted on the CT from 02/11/2011 and confirmed on a colonoscopy 12/17/2010. She began treatment with FOLFIRI/Avastin 12/31/2010. A restaging CT 06/20/2011 confirmed further improvement in the metastatic liver lesions and no evidence for progressive metastatic disease. A restaging CT 08/01/2011 showed stable liver lesions and no new lesions. Restaging CT 09/12/2011 showed stable liver lesions and no new lesions. A restaging CT 10/27/2011 showed stable liver lesions and no new lesions. Restaging CT 12/05/2011 showed mild decrease in size of hepatic metastases and no evidence of disease progression. Restaging CT 01/26/2012 showed stable hepatic metastases and no new lesions. Restaging CT may 5/8/ 2013 revealed a decreased size of hepatic metastases and no new lesions. Restaging CT 04/23/2012 revealed no change in the measurable "target" lesions, a slight increase in the size of other lesions, and no new lesions. Restaging CT 06/04/2012 showed multiple hepatic metastases were grossly unchanged.  Restaging CT 07/19/2012 with stable disease in the liver. Infusional 5-fluorouracil continued on a 2 week schedule. Treatment was held after the 08/03/2012 cycle due to thrombocytopenia. Restaging CT evaluation 08/30/2012 with mild increase in size of large hepatic metastases. Treatment was resumed with 5 fluorouracil and Avastin on 08/31/2012. Restaging CT 10/14/2012-stable hepatic metastases. Treatment continued with 5 fluorouracil and Avastin. Restaging CT 12/01/2012 revealed mild progression in the size of multiple hepatic lesions.  Treatment was placed on hold. CEA 32.1 on 01/06/2013 and 71.9 on 03/11/2013. Restaging CT abdomen/pelvis 03/31/2013 with interval progression of liver metastases. She began cycle 1 CAPOX on 04/15/2013. The Xeloda was prematurely discontinued on 04/25/2013 due to redness and pain over the feet. She completed cycle 2 beginning 05/24/2013 and cycle 3 beginning on 07/05/2013. Disease progression presenting with severe abdominal pain. She began irinotecan/Panitumumab 09/05/2013.  2. Abdominal pain-? Secondary to liver metastases, metastatic adenopathy, or the primary colon tumor. Less likely peptic ulcer disease.  3. Anorexia, early satiety, and weight loss secondary to metastatic colon cancer-resolved.  4. Microcytic anemia. Resolved.  5. History of migraine headaches.  6. Status post uterine fibroid surgery.  7. Port-A-Cath placement 12/20/2010.  8. Delayed nausea following cycle 1 of FOLFIRI/Avastin. Improved with the addition of Aloxi and prophylactic Decadron beginning with cycle #2. The prophylactic Decadron was tapered. We increased the day 1 Decadron back to 20 mg.  9. Neutropenia secondary to chemotherapy. Cycle #4 was held on 02/11/2011. She subsequently received Neulasta support.  10. History of intermittent fever with no apparent source for infection, likely "tumor fever" or fever related to chemotherapy. No recent fever.  11. History of mucositis secondary to 5-FU, initially grade 2. Irinotecan and 5-FU were dose reduced per study guidelines.  12. History of abdominal cramping. ? Related to irinotecan, 5-FU, or potentially the colon tumor. No recent abdominal cramping.  13. Thrombocytopenia secondary to chemotherapy (while being treated with irinotecan and Avastin). Treatment was held for thrombocytopenia per study guidelines. The thrombocytopenia may be related to Avastin, or chronic liver disease from chemotherapy. The CT on 04/23/2012 confirmed mild splenomegaly. The CT on 06/04/2012 again  showed splenomegaly. The thrombocytopenia persisted despite being off of irinotecan and Avastin for several months. The thrombocytopenia improved. She developed thrombocytopenia following cycle 1 and cycle 2 CAPOX. She again has thrombocytopenia.  14. Acne-like rash on the face. Question related to steroids. Resolved.  15. Arthralgias-most likely unrelated to the colon cancer diagnosis and chemotherapy. She has been evaluated by orthopedics. The etiology of the arthralgias is unclear.  16. Persistent discomfort at the right shoulder. Negative CT of the right upper extremity on 07/19/2012. MRI on 08/06/2012 with a partial thickness bursal surface tear of the supraspinatus tendon, moderate rotator cuff tendinopathy/tendinosis. She is now followed by orthopedics. The pain is stable. The range of motion at the right shoulder has improved  17.? Swelling at the right upper extremity and chest wall April 2014-negative right upper extremity Doppler.  18. Delayed nausea following cycle 1 CAPOX. Emend and Aloxi were added with cycle 2.  19. Mucositis and hand-foot syndrome secondary to Xeloda. The Xeloda was dose reduced to 1000 mg twice daily for 14 days with cycle 2.  20. Thrombocytopenia following cycle 1 CAPOX. Cycle 2 and cycle 3 were delayed due to thrombocytopenia. The oxaliplatin was further dose reduced with cycle 3.  21.New onset pruritus 04/04/2013-hyperbilirubinemia and an elevated alkaline phosphatase noted on labs 04/06/2013, the CT 03/31/2013 revealed peripheral biliary ductal dilatation in the medial segment of the  left hepatic lobe,no options for intervention after evaluation by Dr. Ewing Schlein. The hyperbilirubinemia initially resolved and was mildly elevated on 07/26/2013.  22. Acute severe upper abdominal pain, pleuritic, with radiation to the chest/shoulders beginning 07/25/2013-etiology unclear. Chest CT 07/25/2013 showed no evidence of an acute pulmonary embolus and no acute or metastatic findings in  the chest. CT abdomen/pelvis on 07/26/2013 showed interval slight progression of metastatic disease to the liver; no new hepatic metastases; slight progression of intrahepatic biliary ductal dilatation; interval marked increase in the size of the spleen; interval increase in the size of a portacaval lymph node and gastrohepatic ligament lymph nodes; contracted thickwalled gallbladder possibly containing sludge. The pain is likely related to metastatic disease involving the liver with possible irritation of the diaphragm. The pain has resolved.  23. Status post biopsy of a liver lesion 08/10/2013. Pathology confirmed metastatic adenocarcinoma. Extended K-ras testing returned negative for the K-ras mutation.  24. Constipation. Likely related to narcotics. She will increase MiraLAX and Senokot-S to twice daily. She was given a prescription for sorbitol 15 cc daily as needed. Bowels now moving more regularly.  25. Panitumumab skin rash-mild, she will continue minocycline and moisturizer.  26. Insomnia, anxiety and restlessness following abrupt discontinuation of Percocet. Resolved after a slow Percocet taper.  27. Left buccal ulcer-likely secondary to chemotherapy. Stable. 28. Acute nausea during the irinotecan infusion, lasting for approximately 24 hours. We will add Ativan to the premedication regimen. 29. Erythema over Port-A-Cath site following application of numbing cream.    Dispositon-she appears stable. Neutrophils are mildly decreased on labs today. Plan to proceed with cycle 4 irinotecan/Panitumumab as scheduled. As noted above we will add Ativan as a premedication due to acute nausea during the irinotecan infusion.  We will followup on the CEA from today.  She will return for a followup visit and cycle 5 irinotecan/Panitumumab in 2 weeks. She will contact the office in the interim with any problems. We specifically discussed worsening redness over the Port-A-Cath site, fever, other signs of  infection.  Patient seen with Dr. Truett Perna.  25 minutes were spent face-to-face at today's visit with the majority of that time involved in counseling/coordination of care.   Natalie Mccarthy ANP/GNP-BC   This was a shared visit with Natalie Mccarthy. The erythema overlying the Port-A-Cath is most likely related to contact dermatitis. She will contact us for increased erythema or fever.  Mancel Bale, M.D.

## 2013-11-01 NOTE — Patient Instructions (Signed)
[facility] Discharge Instructions for Patients Receiving Chemotherapy  Today you received the following chemotherapy agents: Camptosar and Vectibix  To help prevent nausea and vomiting after your treatment, we encourage you to take your nausea medication as needed.   If you develop nausea and vomiting that is not controlled by your nausea medication, call the clinic.   BELOW ARE SYMPTOMS THAT SHOULD BE REPORTED IMMEDIATELY:  *FEVER GREATER THAN 100.5 F  *CHILLS WITH OR WITHOUT FEVER  NAUSEA AND VOMITING THAT IS NOT CONTROLLED WITH YOUR NAUSEA MEDICATION  *UNUSUAL SHORTNESS OF BREATH  *UNUSUAL BRUISING OR BLEEDING  TENDERNESS IN MOUTH AND THROAT WITH OR WITHOUT PRESENCE OF ULCERS  *URINARY PROBLEMS  *BOWEL PROBLEMS  UNUSUAL RASH Items with * indicate a potential emergency and should be followed up as soon as possible.  Feel free to call the clinic you have any questions or concerns. The clinic phone number is [phone].

## 2013-11-01 NOTE — Telephone Encounter (Signed)
gv and printed aptp sched and avs for pt for Jan 2015....sed added tx. °

## 2013-11-01 NOTE — Progress Notes (Signed)
This RN called [employer] on behalf of patient to inquire about insurance denial of extended KRAS testing.  Per [employer] representative, the patient's case is being appealed and will continue to be appealed until exhausted.  The representative gave this RN the toll free number and the reference number to the case and asked that the patient call back in 1 month to check real-time status of the case.  The above information was given to the patient.  Patient was also told, as requested by [employer], that a payment plan or financial assistance will be set up for patient if it does not work out from the appeal standpoint.  Patient given information- phone [phone], ref. # [id number].  She was very Adult nurse.

## 2013-11-02 ENCOUNTER — Telehealth: Payer: Self-pay | Admitting: *Deleted

## 2013-11-02 LAB — CEA: CEA: 6.6 ng/mL — ABNORMAL HIGH (ref 0.0–5.0)

## 2013-11-02 NOTE — Telephone Encounter (Signed)
Informed of CEA result of 6.6. Very excited!

## 2013-11-13 ENCOUNTER — Other Ambulatory Visit: Payer: Self-pay | Admitting: Oncology

## 2013-11-15 ENCOUNTER — Other Ambulatory Visit (HOSPITAL_BASED_OUTPATIENT_CLINIC_OR_DEPARTMENT_OTHER): Payer: BC Managed Care – PPO

## 2013-11-15 ENCOUNTER — Ambulatory Visit (HOSPITAL_BASED_OUTPATIENT_CLINIC_OR_DEPARTMENT_OTHER): Payer: BC Managed Care – PPO | Admitting: Nurse Practitioner

## 2013-11-15 ENCOUNTER — Ambulatory Visit (HOSPITAL_BASED_OUTPATIENT_CLINIC_OR_DEPARTMENT_OTHER): Payer: BC Managed Care – PPO

## 2013-11-15 ENCOUNTER — Telehealth: Payer: Self-pay | Admitting: *Deleted

## 2013-11-15 ENCOUNTER — Telehealth: Payer: Self-pay | Admitting: Oncology

## 2013-11-15 VITALS — BP 127/56 | HR 77 | Temp 98.1°F | Resp 18 | Ht 67.0 in | Wt 148.0 lb

## 2013-11-15 DIAGNOSIS — C189 Malignant neoplasm of colon, unspecified: Secondary | ICD-10-CM

## 2013-11-15 DIAGNOSIS — L89309 Pressure ulcer of unspecified buttock, unspecified stage: Secondary | ICD-10-CM

## 2013-11-15 DIAGNOSIS — D696 Thrombocytopenia, unspecified: Secondary | ICD-10-CM

## 2013-11-15 DIAGNOSIS — R110 Nausea: Secondary | ICD-10-CM

## 2013-11-15 DIAGNOSIS — C184 Malignant neoplasm of transverse colon: Secondary | ICD-10-CM

## 2013-11-15 DIAGNOSIS — K5900 Constipation, unspecified: Secondary | ICD-10-CM

## 2013-11-15 DIAGNOSIS — L8990 Pressure ulcer of unspecified site, unspecified stage: Secondary | ICD-10-CM

## 2013-11-15 DIAGNOSIS — Z5111 Encounter for antineoplastic chemotherapy: Secondary | ICD-10-CM

## 2013-11-15 DIAGNOSIS — C787 Secondary malignant neoplasm of liver and intrahepatic bile duct: Secondary | ICD-10-CM

## 2013-11-15 DIAGNOSIS — Z5112 Encounter for antineoplastic immunotherapy: Secondary | ICD-10-CM

## 2013-11-15 DIAGNOSIS — R109 Unspecified abdominal pain: Secondary | ICD-10-CM

## 2013-11-15 DIAGNOSIS — R21 Rash and other nonspecific skin eruption: Secondary | ICD-10-CM

## 2013-11-15 LAB — CBC WITH DIFFERENTIAL/PLATELET
BASO%: 1.1 % (ref 0.0–2.0)
Basophils Absolute: 0 10*3/uL (ref 0.0–0.1)
EOS%: 3.7 % (ref 0.0–7.0)
Eosinophils Absolute: 0.1 10*3/uL (ref 0.0–0.5)
HEMATOCRIT: 37.8 % (ref 34.8–46.6)
HGB: 12.2 g/dL (ref 11.6–15.9)
LYMPH%: 16.9 % (ref 14.0–49.7)
MCH: 26.8 pg (ref 25.1–34.0)
MCHC: 32.4 g/dL (ref 31.5–36.0)
MCV: 82.6 fL (ref 79.5–101.0)
MONO#: 0.3 10*3/uL (ref 0.1–0.9)
MONO%: 12.2 % (ref 0.0–14.0)
NEUT#: 1.5 10*3/uL (ref 1.5–6.5)
NEUT%: 66.1 % (ref 38.4–76.8)
PLATELETS: 64 10*3/uL — AB (ref 145–400)
RBC: 4.57 10*6/uL (ref 3.70–5.45)
RDW: 18.8 % — ABNORMAL HIGH (ref 11.2–14.5)
WBC: 2.2 10*3/uL — ABNORMAL LOW (ref 3.9–10.3)
lymph#: 0.4 10*3/uL — ABNORMAL LOW (ref 0.9–3.3)

## 2013-11-15 LAB — COMPREHENSIVE METABOLIC PANEL (CC13)
ALK PHOS: 362 U/L — AB (ref 40–150)
ALT: 41 U/L (ref 0–55)
AST: 44 U/L — AB (ref 5–34)
Albumin: 3.6 g/dL (ref 3.5–5.0)
Anion Gap: 9 mEq/L (ref 3–11)
BILIRUBIN TOTAL: 0.64 mg/dL (ref 0.20–1.20)
BUN: 11.5 mg/dL (ref 7.0–26.0)
CO2: 26 mEq/L (ref 22–29)
CREATININE: 0.7 mg/dL (ref 0.6–1.1)
Calcium: 9 mg/dL (ref 8.4–10.4)
Chloride: 108 mEq/L (ref 98–109)
Glucose: 89 mg/dl (ref 70–140)
Potassium: 4.2 mEq/L (ref 3.5–5.1)
SODIUM: 144 meq/L (ref 136–145)
TOTAL PROTEIN: 6.7 g/dL (ref 6.4–8.3)

## 2013-11-15 LAB — MAGNESIUM (CC13): Magnesium: 1.6 mg/dl (ref 1.5–2.5)

## 2013-11-15 LAB — CEA: CEA: 5 ng/mL (ref 0.0–5.0)

## 2013-11-15 MED ORDER — ATROPINE SULFATE 1 MG/ML IJ SOLN: 0.5000 mg | Freq: Once | INTRAMUSCULAR | Status: AC | PRN

## 2013-11-15 MED ORDER — SODIUM CHLORIDE 0.9 % IV SOLN
6.0000 mg/kg | Freq: Once | INTRAVENOUS | Status: AC
  Administered 2013-11-15: 420 mg via INTRAVENOUS
  Filled 2013-11-15: qty 21

## 2013-11-15 MED ORDER — SODIUM CHLORIDE 0.9 % IV SOLN
Freq: Once | INTRAVENOUS | Status: AC
  Administered 2013-11-15: 12:00:00 via INTRAVENOUS

## 2013-11-15 MED ORDER — DEXAMETHASONE SODIUM PHOSPHATE 10 MG/ML IJ SOLN
10.0000 mg | Freq: Once | INTRAMUSCULAR | Status: AC
  Administered 2013-11-15: 10 mg via INTRAVENOUS

## 2013-11-15 MED ORDER — LORAZEPAM 2 MG/ML IJ SOLN
0.5000 mg | Freq: Once | INTRAMUSCULAR | Status: AC
  Administered 2013-11-15: 0.5 mg via INTRAVENOUS

## 2013-11-15 MED ORDER — PALONOSETRON HCL INJECTION 0.25 MG/5ML
0.2500 mg | Freq: Once | INTRAVENOUS | Status: AC
Start: 2013-11-15 — End: 2013-11-15
  Administered 2013-11-15: 0.25 mg via INTRAVENOUS

## 2013-11-15 MED ORDER — HEPARIN SOD (PORK) LOCK FLUSH 100 UNIT/ML IV SOLN
500.0000 [IU] | Freq: Once | INTRAVENOUS | Status: AC | PRN
  Administered 2013-11-15: 500 [IU]
  Filled 2013-11-15: qty 5

## 2013-11-15 MED ORDER — SODIUM CHLORIDE 0.9 % IJ SOLN
10.0000 mL | INTRAMUSCULAR | Status: DC | PRN
Start: 2013-11-15 — End: 2015-12-08
  Administered 2013-11-15: 10 mL
  Filled 2013-11-15: qty 10

## 2013-11-15 MED ORDER — IRINOTECAN HCL CHEMO INJECTION 100 MG/5ML
99.0000 mg/m2 | Freq: Once | INTRAVENOUS | Status: AC
  Administered 2013-11-15: 180 mg via INTRAVENOUS
  Filled 2013-11-15: qty 9

## 2013-11-15 MED ORDER — PALONOSETRON HCL INJECTION 0.25 MG/5ML
INTRAVENOUS | Status: AC
  Filled 2013-11-15: qty 5

## 2013-11-15 MED ORDER — LORAZEPAM 2 MG/ML IJ SOLN
INTRAMUSCULAR | Status: AC
Start: 2013-11-15 — End: 2013-11-15
  Filled 2013-11-15: qty 1

## 2013-11-15 MED ORDER — DEXAMETHASONE SODIUM PHOSPHATE 10 MG/ML IJ SOLN
INTRAMUSCULAR | Status: AC
  Filled 2013-11-15: qty 1

## 2013-11-15 NOTE — Telephone Encounter (Signed)
Per staff message and POF I have scheduled appts.  JMW  

## 2013-11-15 NOTE — Telephone Encounter (Signed)
gave pt appt for lab and MD, emailed michelle regarding chemo on February 2015

## 2013-11-15 NOTE — Patient Instructions (Signed)
Pt did not want a copy of AVS

## 2013-11-15 NOTE — Progress Notes (Signed)
OFFICE PROGRESS NOTE  Interval history:  Natalie Mccarthy returns for followup of metastatic colon cancer. She completed cycle 4 irinotecan/cycle 5 Panitumumab on 11/01/2013. She noted less nausea during the irinotecan infusion. No diarrhea. She continues to have a single mouth sore that fluctuates in size. She has a good appetite. No abdominal pain. At the time of her last visit on 11/01/2013 the skin overlying the Port-A-Cath site was noted to be erythematous. She reports the erythema persisted for about 3-4 days and then resolved. She is concerned the redness was related to the numbing cream. She did not apply numbing cream today. Skin continues to be dry. She denies a skin rash. No bleeding.   Objective: Filed Vitals:   11/15/13 0959  BP: 127/56  Pulse: 77  Temp: 98.1 F (36.7 C)  Resp: 18   Single ulceration at the left buccal mucosa. No thrush. Lungs are clear. Regular cardiac rhythm. Port-A-Cath site is without erythema. Abdomen soft and nontender. No hepatomegaly. No leg swelling. Calves soft and nontender. Skin is dry. No rash.   Lab Results: Lab Results  Component Value Date   WBC 2.2* 11/15/2013   HGB 12.2 11/15/2013   HCT 37.8 11/15/2013   MCV 82.6 11/15/2013   PLT 64* 11/15/2013   NEUTROABS 1.5 11/15/2013    Chemistry:    Chemistry      Component Value Date/Time   NA 144 11/15/2013 0947   NA 136 07/25/2013 1345   NA 143 01/27/2012 1354   K 4.2 11/15/2013 0947   K 4.3 07/25/2013 1345   K 3.8 01/27/2012 1354   CL 99 07/25/2013 1345   CL 101 04/15/2013 1230   CL 97* 01/27/2012 1354   CO2 26 11/15/2013 0947   CO2 27 07/25/2013 1345   CO2 29 01/27/2012 1354   BUN 11.5 11/15/2013 0947   BUN 12 07/25/2013 1345   BUN 11 01/27/2012 1354   CREATININE 0.7 11/15/2013 0947   CREATININE 0.71 07/25/2013 1345   CREATININE 0.9 01/27/2012 1354      Component Value Date/Time   CALCIUM 9.0 11/15/2013 0947   CALCIUM 9.9 07/25/2013 1345   CALCIUM 8.9 01/27/2012 1354   ALKPHOS 362* 11/15/2013 0947    ALKPHOS 500* 04/13/2013 0600   ALKPHOS 198* 01/27/2012 1354   AST 44* 11/15/2013 0947   AST 214* 04/13/2013 0600   AST 34 01/27/2012 1354   ALT 41 11/15/2013 0947   ALT 220* 04/13/2013 0600   ALT 41 01/27/2012 1354   BILITOT 0.64 11/15/2013 0947   BILITOT 3.9* 04/13/2013 0600   BILITOT 0.50 01/27/2012 1354       Studies/Results: No results found.  Medications: I have reviewed the patient's current medications.  Assessment/Plan: 1.Metastatic colon cancer with multiple liver metastases noted on an MRI of the abdomen 12/02/2010 and on a CT 12/13/2010. A transverse colon mass was noted on the CT from 02/11/2011 and confirmed on a colonoscopy 12/17/2010. She began treatment with FOLFIRI/Avastin 12/31/2010. A restaging CT 06/20/2011 confirmed further improvement in the metastatic liver lesions and no evidence for progressive metastatic disease. A restaging CT 08/01/2011 showed stable liver lesions and no new lesions. Restaging CT 09/12/2011 showed stable liver lesions and no new lesions. A restaging CT 10/27/2011 showed stable liver lesions and no new lesions. Restaging CT 12/05/2011 showed mild decrease in size of hepatic metastases and no evidence of disease progression. Restaging CT 01/26/2012 showed stable hepatic metastases and no new lesions. Restaging CT may 5/8/ 2013 revealed a decreased size of  hepatic metastases and no new lesions. Restaging CT 04/23/2012 revealed no change in the measurable "target" lesions, a slight increase in the size of other lesions, and no new lesions. Restaging CT 06/04/2012 showed multiple hepatic metastases were grossly unchanged. Restaging CT 07/19/2012 with stable disease in the liver. Infusional 5-fluorouracil continued on a 2 week schedule. Treatment was held after the 08/03/2012 cycle due to thrombocytopenia. Restaging CT evaluation 08/30/2012 with mild increase in size of large hepatic metastases. Treatment was resumed with 5 fluorouracil and Avastin on 08/31/2012.  Restaging CT 10/14/2012-stable hepatic metastases. Treatment continued with 5 fluorouracil and Avastin. Restaging CT 12/01/2012 revealed mild progression in the size of multiple hepatic lesions. Treatment was placed on hold. CEA 32.1 on 01/06/2013 and 71.9 on 03/11/2013. Restaging CT abdomen/pelvis 03/31/2013 with interval progression of liver metastases. She began cycle 1 CAPOX on 04/15/2013. The Xeloda was prematurely discontinued on 04/25/2013 due to redness and pain over the feet. She completed cycle 2 beginning 05/24/2013 and cycle 3 beginning on 07/05/2013. Disease progression presenting with severe abdominal pain. She began irinotecan/Panitumumab 09/05/2013. CEA markedly improved at 6.6 on 11/01/2013. 2. Abdominal pain-? Secondary to liver metastases, metastatic adenopathy, or the primary colon tumor. Less likely peptic ulcer disease.  3. Anorexia, early satiety, and weight loss secondary to metastatic colon cancer-resolved.  4. Microcytic anemia. Resolved.  5. History of migraine headaches.  6. Status post uterine fibroid surgery.  7. Port-A-Cath placement 12/20/2010.  8. Delayed nausea following cycle 1 of FOLFIRI/Avastin. Improved with the addition of Aloxi and prophylactic Decadron beginning with cycle #2. The prophylactic Decadron was tapered. We increased the day 1 Decadron back to 20 mg.  9. Neutropenia secondary to chemotherapy. Cycle #4 was held on 02/11/2011. She subsequently received Neulasta support.  10. History of intermittent fever with no apparent source for infection, likely "tumor fever" or fever related to chemotherapy. No recent fever.  11. History of mucositis secondary to 5-FU, initially grade 2. Irinotecan and 5-FU were dose reduced per study guidelines.  12. History of abdominal cramping. ? Related to irinotecan, 5-FU, or potentially the colon tumor. No recent abdominal cramping.  13. Thrombocytopenia secondary to chemotherapy (while being treated with irinotecan and  Avastin). Treatment was held for thrombocytopenia per study guidelines. The thrombocytopenia may be related to Avastin, or chronic liver disease from chemotherapy. The CT on 04/23/2012 confirmed mild splenomegaly. The CT on 06/04/2012 again showed splenomegaly. The thrombocytopenia persisted despite being off of irinotecan and Avastin for several months. The thrombocytopenia improved. She developed thrombocytopenia following cycle 1 and cycle 2 CAPOX. She again has thrombocytopenia.  14. Acne-like rash on the face. Question related to steroids. Resolved.  57. Arthralgias-most likely unrelated to the colon cancer diagnosis and chemotherapy. She has been evaluated by orthopedics. The etiology of the arthralgias is unclear.  16. Persistent discomfort at the right shoulder. Negative CT of the right upper extremity on 07/19/2012. MRI on 08/06/2012 with a partial thickness bursal surface tear of the supraspinatus tendon, moderate rotator cuff tendinopathy/tendinosis. She is now followed by orthopedics. The pain is stable. The range of motion at the right shoulder has improved  17.? Swelling at the right upper extremity and chest wall April 2014-negative right upper extremity Doppler.  18. Delayed nausea following cycle 1 CAPOX. Emend and Aloxi were added with cycle 2.  19. Mucositis and hand-foot syndrome secondary to Xeloda. The Xeloda was dose reduced to 1000 mg twice daily for 14 days with cycle 2.  20. Thrombocytopenia following cycle 1 CAPOX. Cycle  2 and cycle 3 were delayed due to thrombocytopenia. The oxaliplatin was further dose reduced with cycle 3.  21.New onset pruritus 04/04/2013-hyperbilirubinemia and an elevated alkaline phosphatase noted on labs 04/06/2013, the CT 03/31/2013 revealed peripheral biliary ductal dilatation in the medial segment of the left hepatic lobe,no options for intervention after evaluation by Dr. Watt Climes. The hyperbilirubinemia initially resolved and was mildly elevated on  07/26/2013.  22. Acute severe upper abdominal pain, pleuritic, with radiation to the chest/shoulders beginning 07/25/2013-etiology unclear. Chest CT 07/25/2013 showed no evidence of an acute pulmonary embolus and no acute or metastatic findings in the chest. CT abdomen/pelvis on 07/26/2013 showed interval slight progression of metastatic disease to the liver; no new hepatic metastases; slight progression of intrahepatic biliary ductal dilatation; interval marked increase in the size of the spleen; interval increase in the size of a portacaval lymph node and gastrohepatic ligament lymph nodes; contracted thickwalled gallbladder possibly containing sludge. The pain is likely related to metastatic disease involving the liver with possible irritation of the diaphragm. The pain has resolved.  23. Status post biopsy of a liver lesion 08/10/2013. Pathology confirmed metastatic adenocarcinoma. Extended K-ras testing returned negative for the K-ras mutation.  24. Constipation.  25. Panitumumab skin rash. Mild. She continues minocycline and moisturizer.  26. Insomnia, anxiety and restlessness following abrupt discontinuation of Percocet. Resolved after a slow Percocet taper.  27. Left buccal ulcer-likely secondary to chemotherapy. Stable.  28. Acute nausea during the irinotecan infusion, lasting for approximately 24 hours. Improved with addition of Ativan to the premedication regimen.  29. Erythema over Port-A-Cath site following application of numbing cream 11/01/2013. She did not apply the numbing cream today.   Dispositon-she appears stable. She has completed 4 cycles of irinotecan and 5 cycles of Panitumumab. The CEA was significantly improved on 11/01/2013.  She has thrombocytopenia on labs today. She has been treated at a similar platelet count in the past (10/18/2013). Plan to proceed with cycle 5 irinotecan/cycle 6 Panitumumab today as scheduled. We will have her return for a followup CBC in one week. She  knows to contact the office with any bleeding.  We are scheduling her for a restaging CT evaluation on 11/25/2013. She will return for a followup visit in 2 weeks. She will contact the office in the interim as outlined above or with any other problems.  Plan reviewed with Dr. Benay Spice.   Ned Card ANP/GNP-BC

## 2013-11-17 ENCOUNTER — Telehealth: Payer: Self-pay | Admitting: Oncology

## 2013-11-17 NOTE — Telephone Encounter (Signed)
returned call re lab appt for 1/20. lmonvm for pt re lab appt time for 1/20 @ 2:45pm. schedule mailed.

## 2013-11-19 ENCOUNTER — Telehealth: Payer: Self-pay | Admitting: Oncology

## 2013-11-19 NOTE — Telephone Encounter (Signed)
Talked to pt, she already have appt calendar for the month of January 2015

## 2013-11-22 ENCOUNTER — Other Ambulatory Visit (HOSPITAL_BASED_OUTPATIENT_CLINIC_OR_DEPARTMENT_OTHER): Payer: BC Managed Care – PPO

## 2013-11-22 ENCOUNTER — Telehealth: Payer: Self-pay | Admitting: *Deleted

## 2013-11-22 DIAGNOSIS — C189 Malignant neoplasm of colon, unspecified: Secondary | ICD-10-CM

## 2013-11-22 DIAGNOSIS — C187 Malignant neoplasm of sigmoid colon: Secondary | ICD-10-CM

## 2013-11-22 DIAGNOSIS — C787 Secondary malignant neoplasm of liver and intrahepatic bile duct: Secondary | ICD-10-CM

## 2013-11-22 LAB — COMPREHENSIVE METABOLIC PANEL (CC13)
ALBUMIN: 3.7 g/dL (ref 3.5–5.0)
ALK PHOS: 335 U/L — AB (ref 40–150)
ALT: 49 U/L (ref 0–55)
AST: 44 U/L — ABNORMAL HIGH (ref 5–34)
Anion Gap: 7 mEq/L (ref 3–11)
BUN: 10.7 mg/dL (ref 7.0–26.0)
CALCIUM: 9.5 mg/dL (ref 8.4–10.4)
CO2: 31 meq/L — AB (ref 22–29)
Chloride: 104 mEq/L (ref 98–109)
Creatinine: 0.6 mg/dL (ref 0.6–1.1)
GLUCOSE: 102 mg/dL (ref 70–140)
POTASSIUM: 4.6 meq/L (ref 3.5–5.1)
Sodium: 143 mEq/L (ref 136–145)
Total Bilirubin: 0.52 mg/dL (ref 0.20–1.20)
Total Protein: 6.8 g/dL (ref 6.4–8.3)

## 2013-11-22 LAB — CBC WITH DIFFERENTIAL/PLATELET
BASO%: 1.1 % (ref 0.0–2.0)
Basophils Absolute: 0 10*3/uL (ref 0.0–0.1)
EOS ABS: 0.1 10*3/uL (ref 0.0–0.5)
EOS%: 4.6 % (ref 0.0–7.0)
HCT: 35.8 % (ref 34.8–46.6)
HEMOGLOBIN: 11.6 g/dL (ref 11.6–15.9)
LYMPH%: 26.9 % (ref 14.0–49.7)
MCH: 26.6 pg (ref 25.1–34.0)
MCHC: 32.5 g/dL (ref 31.5–36.0)
MCV: 81.9 fL (ref 79.5–101.0)
MONO#: 0.2 10*3/uL (ref 0.1–0.9)
MONO%: 14.5 % — AB (ref 0.0–14.0)
NEUT%: 52.9 % (ref 38.4–76.8)
NEUTROS ABS: 0.8 10*3/uL — AB (ref 1.5–6.5)
Platelets: 74 10*3/uL — ABNORMAL LOW (ref 145–400)
RBC: 4.37 10*6/uL (ref 3.70–5.45)
RDW: 19.3 % — AB (ref 11.2–14.5)
WBC: 1.5 10*3/uL — ABNORMAL LOW (ref 3.9–10.3)
lymph#: 0.4 10*3/uL — ABNORMAL LOW (ref 0.9–3.3)

## 2013-11-22 NOTE — Telephone Encounter (Signed)
Call from pt requesting CBC results. Reviewed by Dr. Benay Spice. Platelet count is OK. Pt is neutropenic. Discussed neutropenic precautions, pt voiced understanding.

## 2013-11-25 ENCOUNTER — Ambulatory Visit (HOSPITAL_COMMUNITY)
Admission: RE | Admit: 2013-11-25 | Discharge: 2013-11-25 | Disposition: A | Discharge status: Home / Self Care | Payer: BC Managed Care – PPO | Source: Ambulatory Visit | Attending: Nurse Practitioner | Admitting: Nurse Practitioner

## 2013-11-25 DIAGNOSIS — C189 Malignant neoplasm of colon, unspecified: Secondary | ICD-10-CM | POA: Insufficient documentation

## 2013-11-25 DIAGNOSIS — Z9221 Personal history of antineoplastic chemotherapy: Secondary | ICD-10-CM | POA: Insufficient documentation

## 2013-11-25 DIAGNOSIS — R599 Enlarged lymph nodes, unspecified: Secondary | ICD-10-CM | POA: Insufficient documentation

## 2013-11-25 DIAGNOSIS — K389 Disease of appendix, unspecified: Secondary | ICD-10-CM | POA: Insufficient documentation

## 2013-11-25 DIAGNOSIS — R161 Splenomegaly, not elsewhere classified: Secondary | ICD-10-CM | POA: Insufficient documentation

## 2013-11-25 DIAGNOSIS — C787 Secondary malignant neoplasm of liver and intrahepatic bile duct: Secondary | ICD-10-CM | POA: Insufficient documentation

## 2013-11-25 MED ORDER — IOHEXOL 300 MG/ML  SOLN
50.0000 mL | Freq: Once | INTRAMUSCULAR | Status: AC | PRN
  Administered 2013-11-25: 50 mL via ORAL

## 2013-11-25 MED ORDER — IOHEXOL 300 MG/ML  SOLN
100.0000 mL | Freq: Once | INTRAMUSCULAR | Status: AC | PRN
  Administered 2013-11-25: 100 mL via INTRAVENOUS

## 2013-11-27 ENCOUNTER — Other Ambulatory Visit: Payer: Self-pay | Admitting: Oncology

## 2013-11-29 ENCOUNTER — Ambulatory Visit (HOSPITAL_BASED_OUTPATIENT_CLINIC_OR_DEPARTMENT_OTHER): Payer: BC Managed Care – PPO | Admitting: Oncology

## 2013-11-29 ENCOUNTER — Ambulatory Visit (HOSPITAL_BASED_OUTPATIENT_CLINIC_OR_DEPARTMENT_OTHER): Payer: BC Managed Care – PPO

## 2013-11-29 ENCOUNTER — Telehealth: Payer: Self-pay | Admitting: Oncology

## 2013-11-29 ENCOUNTER — Other Ambulatory Visit (HOSPITAL_BASED_OUTPATIENT_CLINIC_OR_DEPARTMENT_OTHER): Payer: BC Managed Care – PPO

## 2013-11-29 VITALS — BP 136/72 | HR 79 | Temp 97.6°F | Resp 18 | Ht 67.0 in | Wt 146.7 lb

## 2013-11-29 DIAGNOSIS — R109 Unspecified abdominal pain: Secondary | ICD-10-CM

## 2013-11-29 DIAGNOSIS — C184 Malignant neoplasm of transverse colon: Secondary | ICD-10-CM

## 2013-11-29 DIAGNOSIS — K5900 Constipation, unspecified: Secondary | ICD-10-CM

## 2013-11-29 DIAGNOSIS — C189 Malignant neoplasm of colon, unspecified: Secondary | ICD-10-CM

## 2013-11-29 DIAGNOSIS — C787 Secondary malignant neoplasm of liver and intrahepatic bile duct: Secondary | ICD-10-CM

## 2013-11-29 DIAGNOSIS — Z5112 Encounter for antineoplastic immunotherapy: Secondary | ICD-10-CM

## 2013-11-29 DIAGNOSIS — R21 Rash and other nonspecific skin eruption: Secondary | ICD-10-CM

## 2013-11-29 DIAGNOSIS — Z5111 Encounter for antineoplastic chemotherapy: Secondary | ICD-10-CM

## 2013-11-29 DIAGNOSIS — D696 Thrombocytopenia, unspecified: Secondary | ICD-10-CM

## 2013-11-29 LAB — CBC WITH DIFFERENTIAL/PLATELET
BASO%: 1.3 % (ref 0.0–2.0)
Basophils Absolute: 0 10*3/uL (ref 0.0–0.1)
EOS ABS: 0.1 10*3/uL (ref 0.0–0.5)
EOS%: 4.2 % (ref 0.0–7.0)
HCT: 37 % (ref 34.8–46.6)
HGB: 12 g/dL (ref 11.6–15.9)
LYMPH%: 16.7 % (ref 14.0–49.7)
MCH: 26.9 pg (ref 25.1–34.0)
MCHC: 32.4 g/dL (ref 31.5–36.0)
MCV: 82.8 fL (ref 79.5–101.0)
MONO#: 0.3 10*3/uL (ref 0.1–0.9)
MONO%: 13.5 % (ref 0.0–14.0)
NEUT#: 1.4 10*3/uL — ABNORMAL LOW (ref 1.5–6.5)
NEUT%: 64.3 % (ref 38.4–76.8)
Platelets: 65 10*3/uL — ABNORMAL LOW (ref 145–400)
RBC: 4.47 10*6/uL (ref 3.70–5.45)
RDW: 20.1 % — AB (ref 11.2–14.5)
WBC: 2.1 10*3/uL — AB (ref 3.9–10.3)
lymph#: 0.4 10*3/uL — ABNORMAL LOW (ref 0.9–3.3)

## 2013-11-29 MED ORDER — LORAZEPAM 2 MG/ML IJ SOLN
INTRAMUSCULAR | Status: AC
  Filled 2013-11-29: qty 1

## 2013-11-29 MED ORDER — HEPARIN SOD (PORK) LOCK FLUSH 100 UNIT/ML IV SOLN
500.0000 [IU] | Freq: Once | INTRAVENOUS | Status: AC | PRN
  Administered 2013-11-29: 500 [IU]
  Filled 2013-11-29: qty 5

## 2013-11-29 MED ORDER — DEXAMETHASONE SODIUM PHOSPHATE 10 MG/ML IJ SOLN
10.0000 mg | Freq: Once | INTRAMUSCULAR | Status: AC
  Administered 2013-11-29: 10 mg via INTRAVENOUS

## 2013-11-29 MED ORDER — IRINOTECAN HCL CHEMO INJECTION 100 MG/5ML
100.0000 mg/m2 | Freq: Once | INTRAVENOUS | Status: AC
  Administered 2013-11-29: 182 mg via INTRAVENOUS
  Filled 2013-11-29: qty 9.1

## 2013-11-29 MED ORDER — PALONOSETRON HCL INJECTION 0.25 MG/5ML
0.2500 mg | Freq: Once | INTRAVENOUS | Status: AC
  Administered 2013-11-29: 0.25 mg via INTRAVENOUS

## 2013-11-29 MED ORDER — SODIUM CHLORIDE 0.9 % IV SOLN: Freq: Once | INTRAVENOUS | Status: DC

## 2013-11-29 MED ORDER — LORAZEPAM 2 MG/ML IJ SOLN
0.5000 mg | Freq: Once | INTRAMUSCULAR | Status: AC
  Administered 2013-11-29: 0.5 mg via INTRAVENOUS

## 2013-11-29 MED ORDER — SODIUM CHLORIDE 0.9 % IV SOLN
Freq: Once | INTRAVENOUS | Status: AC
  Administered 2013-11-29: 13:00:00 via INTRAVENOUS

## 2013-11-29 MED ORDER — SODIUM CHLORIDE 0.9 % IJ SOLN
10.0000 mL | INTRAMUSCULAR | Status: DC | PRN
  Administered 2013-11-29: 10 mL
  Filled 2013-11-29: qty 10

## 2013-11-29 MED ORDER — PALONOSETRON HCL INJECTION 0.25 MG/5ML
INTRAVENOUS | Status: AC
  Filled 2013-11-29: qty 5

## 2013-11-29 MED ORDER — DEXAMETHASONE SODIUM PHOSPHATE 10 MG/ML IJ SOLN
INTRAMUSCULAR | Status: AC
  Filled 2013-11-29: qty 1

## 2013-11-29 MED ORDER — SODIUM CHLORIDE 0.9 % IV SOLN
6.0000 mg/kg | Freq: Once | INTRAVENOUS | Status: AC
  Administered 2013-11-29: 420 mg via INTRAVENOUS
  Filled 2013-11-29: qty 21

## 2013-11-29 NOTE — Progress Notes (Signed)
Natalie Mccarthy    OFFICE PROGRESS NOTE   INTERVAL HISTORY:   Natalie Mccarthy returns for scheduled followup of metastatic colon cancer. Natalie Mccarthy was last treated with irinotecan/panitumumab on 11/15/2013. Natalie Mccarthy tolerated chemotherapy well. Natalie Mccarthy no longer has abdominal pain. Natalie Mccarthy complains of dryness of the skin. Minimal rash. Dry cracking around the fingertips.  Objective:  Vital signs in last 24 hours:  Blood pressure 136/72, pulse 79, temperature 97.6 F (36.4 C), temperature source Oral, resp. rate 18, height $RemoveBe'5\' 7"'CwtRpfihV$  (1.702 m), weight 146 lb 11.2 oz (66.543 kg), SpO2 100.00%.    HEENT: No thrush or ulcers Resp: Lungs clear bilaterally Cardio: Regular rate and rhythm GI: No hepatosplenomegaly, nontender, no mass Vascular: No leg edema  Skin: Trying is over the hands, minimal erythematous rash over the trunk   Portacath/PICC-without erythema  Lab Results:  Lab Results  Component Value Date   WBC 2.1* 11/29/2013   HGB 12.0 11/29/2013   HCT 37.0 11/29/2013   MCV 82.8 11/29/2013   PLT 65* 11/29/2013   NEUTROABS 1.4* 11/29/2013   CEA on 11/15/2013-5.0  X-rays: CT of the abdomen and pelvis on 11/25/2013, compared to 07/26/2013: Decrease in size of hepatic metastases and abdominal lymphadenopathy. Stable splenomegaly, increased in diameter of the appendix   Medications: I have reviewed the patient's current medications.  Assessment/Plan: 1.Metastatic colon cancer with multiple liver metastases noted on an MRI of the abdomen 12/02/2010 and on a CT 12/13/2010. A transverse colon mass was noted on the CT from 02/11/2011 and confirmed on a colonoscopy 12/17/2010. Natalie Mccarthy began treatment with FOLFIRI/Avastin 12/31/2010. A restaging CT 06/20/2011 confirmed further improvement in the metastatic liver lesions and no evidence for progressive metastatic disease. A restaging CT 08/01/2011 showed stable liver lesions and no new lesions. Restaging CT 09/12/2011 showed stable liver lesions and no new  lesions. A restaging CT 10/27/2011 showed stable liver lesions and no new lesions. Restaging CT 12/05/2011 showed mild decrease in size of hepatic metastases and no evidence of disease progression. Restaging CT 01/26/2012 showed stable hepatic metastases and no new lesions. Restaging CT may 5/8/ 2013 revealed a decreased size of hepatic metastases and no new lesions. Restaging CT 04/23/2012 revealed no change in the measurable "target" lesions, a slight increase in the size of other lesions, and no new lesions. Restaging CT 06/04/2012 showed multiple hepatic metastases were grossly unchanged. Restaging CT 07/19/2012 with stable disease in the liver. Infusional 5-fluorouracil continued on a 2 week schedule. Treatment was held after the 08/03/2012 cycle due to thrombocytopenia. Restaging CT evaluation 08/30/2012 with mild increase in size of large hepatic metastases. Treatment was resumed with 5 fluorouracil and Avastin on 08/31/2012. Restaging CT 10/14/2012-stable hepatic metastases. Treatment continued with 5 fluorouracil and Avastin. Restaging CT 12/01/2012 revealed mild progression in the size of multiple hepatic lesions. Treatment was placed on hold. CEA 32.1 on 01/06/2013 and 71.9 on 03/11/2013. Restaging CT abdomen/pelvis 03/31/2013 with interval progression of liver metastases. Natalie Mccarthy began cycle 1 CAPOX on 04/15/2013. The Xeloda was prematurely discontinued on 04/25/2013 due to redness and pain over the feet. Natalie Mccarthy completed cycle 2 beginning 05/24/2013 and cycle 3 beginning on 07/05/2013. Disease progression presenting with severe abdominal pain. Natalie Mccarthy began irinotecan/Panitumumab 09/05/2013. CEA markedly improved at 6.6 on 11/01/2013.  Restaging CT 11/25/2013 with improvement in hepatic metastases and abdominal lymphadenopathy 2. Abdominal pain-? Secondary to liver metastases, metastatic adenopathy, or the primary colon tumor. Less likely peptic ulcer disease. Improved. 3. Anorexia, early satiety, and weight  loss secondary to metastatic colon cancer-resolved.  4. Microcytic anemia. Resolved.  5. History of migraine headaches.  6. Status post uterine fibroid surgery.  7. Port-A-Cath placement 12/20/2010.  8. Delayed nausea following cycle 1 of FOLFIRI/Avastin. Improved with the addition of Aloxi and prophylactic Decadron beginning with cycle #2. The prophylactic Decadron was tapered. We increased the day 1 Decadron back to 20 mg.  9. Neutropenia secondary to chemotherapy. Cycle #4 was held on 02/11/2011. Natalie Mccarthy subsequently received Neulasta support.  10. History of intermittent fever with no apparent source for infection, likely "tumor fever" or fever related to chemotherapy. No recent fever.  11. History of mucositis secondary to 5-FU, initially grade 2. Irinotecan and 5-FU were dose reduced per study guidelines.  12. History of abdominal cramping. ? Related to irinotecan, 5-FU, or potentially the colon tumor. No recent abdominal cramping.  13. Thrombocytopenia secondary to chemotherapy (while being treated with irinotecan and Avastin). Treatment was held for thrombocytopenia per study guidelines. The thrombocytopenia may be related to Avastin, or chronic liver disease from chemotherapy. The CT on 04/23/2012 confirmed mild splenomegaly. The CT on 06/04/2012 again showed splenomegaly. The thrombocytopenia persisted despite being off of irinotecan and Avastin for several months. The thrombocytopenia improved. Natalie Mccarthy developed thrombocytopenia following cycle 1 and cycle 2 CAPOX. Natalie Mccarthy again has thrombocytopenia.  14. Acne-like rash on the face. Question related to steroids. Resolved.  15. Arthralgias-most likely unrelated to the colon cancer diagnosis and chemotherapy. Natalie Mccarthy has been evaluated by orthopedics. The etiology of the arthralgias is unclear.  16. Persistent discomfort at the right shoulder. Negative CT of the right upper extremity on 07/19/2012. MRI on 08/06/2012 with a partial thickness bursal surface tear  of the supraspinatus tendon, moderate rotator cuff tendinopathy/tendinosis. Natalie Mccarthy is now followed by orthopedics. The pain is stable. The range of motion at the right shoulder has improved  17.? Swelling at the right upper extremity and chest wall April 2014-negative right upper extremity Doppler.  18. Delayed nausea following cycle 1 CAPOX. Emend and Aloxi were added with cycle 2.  19. Mucositis and hand-foot syndrome secondary to Xeloda. The Xeloda was dose reduced to 1000 mg twice daily for 14 days with cycle 2.  20. Thrombocytopenia following cycle 1 CAPOX. Cycle 2 and cycle 3 were delayed due to thrombocytopenia. The oxaliplatin was further dose reduced with cycle 3.  21.New onset pruritus 04/04/2013-hyperbilirubinemia and an elevated alkaline phosphatase noted on labs 04/06/2013, the CT 03/31/2013 revealed peripheral biliary ductal dilatation in the medial segment of the left hepatic lobe,no options for intervention after evaluation by Dr. Ewing Schlein. The hyperbilirubinemia initially resolved and was mildly elevated on 07/26/2013.  22. Acute severe upper abdominal pain, pleuritic, with radiation to the chest/shoulders beginning 07/25/2013-etiology unclear. Chest CT 07/25/2013 showed no evidence of an acute pulmonary embolus and no acute or metastatic findings in the chest. CT abdomen/pelvis on 07/26/2013 showed interval slight progression of metastatic disease to the liver; no new hepatic metastases; slight progression of intrahepatic biliary ductal dilatation; interval marked increase in the size of the spleen; interval increase in the size of a portacaval lymph node and gastrohepatic ligament lymph nodes; contracted thickwalled gallbladder possibly containing sludge. The pain is likely related to metastatic disease involving the liver with possible irritation of the diaphragm. The pain has resolved.  23. Status post biopsy of a liver lesion 08/10/2013. Pathology confirmed metastatic adenocarcinoma. Extended  K-ras testing returned negative for the K-ras mutation.  24. Constipation.  25. Panitumumab skin rash. Mild. Natalie Mccarthy continues minocycline and moisturizer.  26. Insomnia, anxiety and restlessness following  abrupt discontinuation of Percocet. Resolved after a slow Percocet taper.  27. Left buccal ulcer-likely secondary to chemotherapy. Resolved. 28. Acute nausea during the irinotecan infusion, lasting for approximately 24 hours. Improved with addition of Ativan to the premedication regimen.  29. Erythema over Port-A-Cath site following application of EMLA cream 11/01/2013-resolved   Disposition:  Natalie Mccarthy has been treated with irinotecan/panitumumab since early November of 2014. There has been clinical, CEA, and radiologic improvement. The plan is to continue irinotecan/panitumumab on a 2 week schedule. Natalie Mccarthy has mild neutropenia and moderate thrombocytopenia today. Natalie Mccarthy was treated with Natalie Mccarthy counts at similar levels 2 weeks ago and did well. Natalie Mccarthy will return for a CBC next week.  Natalie Mccarthy is scheduled for an office visit and chemotherapy on 12/13/2013. I reviewed the restaging CT images with Natalie Mccarthy and Natalie Mccarthy husband.   Betsy Coder, MD  11/29/2013  12:16 PM

## 2013-11-29 NOTE — Progress Notes (Signed)
Okay to treat with chemotherapy and vectibix today with CBC/diff from today.

## 2013-11-29 NOTE — Telephone Encounter (Signed)
gv adn pritned appt sched and avs for pt for Feb...sed added tx.   °

## 2013-12-09 ENCOUNTER — Telehealth: Payer: Self-pay | Admitting: *Deleted

## 2013-12-09 ENCOUNTER — Other Ambulatory Visit (HOSPITAL_BASED_OUTPATIENT_CLINIC_OR_DEPARTMENT_OTHER): Payer: BC Managed Care – PPO

## 2013-12-09 DIAGNOSIS — C184 Malignant neoplasm of transverse colon: Secondary | ICD-10-CM

## 2013-12-09 DIAGNOSIS — C787 Secondary malignant neoplasm of liver and intrahepatic bile duct: Secondary | ICD-10-CM

## 2013-12-09 DIAGNOSIS — C189 Malignant neoplasm of colon, unspecified: Secondary | ICD-10-CM

## 2013-12-09 LAB — CBC WITH DIFFERENTIAL/PLATELET
BASO%: 1.2 % (ref 0.0–2.0)
Basophils Absolute: 0 10*3/uL (ref 0.0–0.1)
EOS ABS: 0.1 10*3/uL (ref 0.0–0.5)
EOS%: 4.1 % (ref 0.0–7.0)
HEMATOCRIT: 36.7 % (ref 34.8–46.6)
HGB: 11.8 g/dL (ref 11.6–15.9)
LYMPH%: 21.7 % (ref 14.0–49.7)
MCH: 26.8 pg (ref 25.1–34.0)
MCHC: 32.3 g/dL (ref 31.5–36.0)
MCV: 83.2 fL (ref 79.5–101.0)
MONO#: 0.3 10*3/uL (ref 0.1–0.9)
MONO%: 15.6 % — ABNORMAL HIGH (ref 0.0–14.0)
NEUT#: 1 10*3/uL — ABNORMAL LOW (ref 1.5–6.5)
NEUT%: 57.4 % (ref 38.4–76.8)
Platelets: 76 10*3/uL — ABNORMAL LOW (ref 145–400)
RBC: 4.41 10*6/uL (ref 3.70–5.45)
RDW: 21.3 % — ABNORMAL HIGH (ref 11.2–14.5)
WBC: 1.7 10*3/uL — AB (ref 3.9–10.3)
lymph#: 0.4 10*3/uL — ABNORMAL LOW (ref 0.9–3.3)

## 2013-12-09 NOTE — Telephone Encounter (Signed)
Notified of CBC results. Reviewed neutropenic precautions. F/U as scheduled.

## 2013-12-11 ENCOUNTER — Other Ambulatory Visit: Payer: Self-pay | Admitting: Oncology

## 2013-12-13 ENCOUNTER — Ambulatory Visit (HOSPITAL_BASED_OUTPATIENT_CLINIC_OR_DEPARTMENT_OTHER): Payer: BC Managed Care – PPO | Admitting: Oncology

## 2013-12-13 ENCOUNTER — Other Ambulatory Visit (HOSPITAL_BASED_OUTPATIENT_CLINIC_OR_DEPARTMENT_OTHER): Payer: BC Managed Care – PPO

## 2013-12-13 ENCOUNTER — Telehealth: Payer: Self-pay | Admitting: Oncology

## 2013-12-13 ENCOUNTER — Ambulatory Visit (HOSPITAL_BASED_OUTPATIENT_CLINIC_OR_DEPARTMENT_OTHER): Payer: BC Managed Care – PPO

## 2013-12-13 VITALS — BP 135/60 | HR 69 | Temp 97.9°F | Resp 18 | Ht 67.0 in | Wt 146.3 lb

## 2013-12-13 DIAGNOSIS — Z5112 Encounter for antineoplastic immunotherapy: Secondary | ICD-10-CM

## 2013-12-13 DIAGNOSIS — C184 Malignant neoplasm of transverse colon: Secondary | ICD-10-CM

## 2013-12-13 DIAGNOSIS — C787 Secondary malignant neoplasm of liver and intrahepatic bile duct: Secondary | ICD-10-CM

## 2013-12-13 DIAGNOSIS — K1230 Oral mucositis (ulcerative), unspecified: Secondary | ICD-10-CM

## 2013-12-13 DIAGNOSIS — D696 Thrombocytopenia, unspecified: Secondary | ICD-10-CM

## 2013-12-13 DIAGNOSIS — K121 Other forms of stomatitis: Secondary | ICD-10-CM

## 2013-12-13 DIAGNOSIS — R21 Rash and other nonspecific skin eruption: Secondary | ICD-10-CM

## 2013-12-13 DIAGNOSIS — R109 Unspecified abdominal pain: Secondary | ICD-10-CM

## 2013-12-13 DIAGNOSIS — C189 Malignant neoplasm of colon, unspecified: Secondary | ICD-10-CM

## 2013-12-13 LAB — COMPREHENSIVE METABOLIC PANEL (CC13)
ALK PHOS: 306 U/L — AB (ref 40–150)
ALT: 37 U/L (ref 0–55)
AST: 37 U/L — AB (ref 5–34)
Albumin: 3.8 g/dL (ref 3.5–5.0)
Anion Gap: 5 mEq/L (ref 3–11)
BUN: 12.4 mg/dL (ref 7.0–26.0)
CALCIUM: 9.5 mg/dL (ref 8.4–10.4)
CO2: 29 mEq/L (ref 22–29)
Chloride: 108 mEq/L (ref 98–109)
Creatinine: 0.7 mg/dL (ref 0.6–1.1)
Glucose: 90 mg/dl (ref 70–140)
Potassium: 4.5 mEq/L (ref 3.5–5.1)
Sodium: 142 mEq/L (ref 136–145)
Total Bilirubin: 0.7 mg/dL (ref 0.20–1.20)
Total Protein: 6.7 g/dL (ref 6.4–8.3)

## 2013-12-13 LAB — CBC WITH DIFFERENTIAL/PLATELET
BASO%: 1 % (ref 0.0–2.0)
Basophils Absolute: 0 10*3/uL (ref 0.0–0.1)
EOS%: 4.5 % (ref 0.0–7.0)
Eosinophils Absolute: 0.1 10*3/uL (ref 0.0–0.5)
HEMATOCRIT: 37.5 % (ref 34.8–46.6)
HGB: 12 g/dL (ref 11.6–15.9)
LYMPH%: 15.9 % (ref 14.0–49.7)
MCH: 26.8 pg (ref 25.1–34.0)
MCHC: 32 g/dL (ref 31.5–36.0)
MCV: 83.7 fL (ref 79.5–101.0)
MONO#: 0.3 10*3/uL (ref 0.1–0.9)
MONO%: 12.4 % (ref 0.0–14.0)
NEUT#: 1.3 10*3/uL — ABNORMAL LOW (ref 1.5–6.5)
NEUT%: 66.2 % (ref 38.4–76.8)
PLATELETS: 56 10*3/uL — AB (ref 145–400)
RBC: 4.48 10*6/uL (ref 3.70–5.45)
RDW: 19.2 % — ABNORMAL HIGH (ref 11.2–14.5)
WBC: 2 10*3/uL — ABNORMAL LOW (ref 3.9–10.3)
lymph#: 0.3 10*3/uL — ABNORMAL LOW (ref 0.9–3.3)
nRBC: 0 % (ref 0–0)

## 2013-12-13 MED ORDER — HEPARIN SOD (PORK) LOCK FLUSH 100 UNIT/ML IV SOLN
500.0000 [IU] | Freq: Once | INTRAVENOUS | Status: AC | PRN
  Administered 2013-12-13: 500 [IU]
  Filled 2013-12-13: qty 5

## 2013-12-13 MED ORDER — SODIUM CHLORIDE 0.9 % IV SOLN
Freq: Once | INTRAVENOUS | Status: AC
  Administered 2013-12-13: 13:00:00 via INTRAVENOUS

## 2013-12-13 MED ORDER — SODIUM CHLORIDE 0.9 % IV SOLN
6.0000 mg/kg | Freq: Once | INTRAVENOUS | Status: AC
  Administered 2013-12-13: 420 mg via INTRAVENOUS
  Filled 2013-12-13: qty 21

## 2013-12-13 MED ORDER — SODIUM CHLORIDE 0.9 % IJ SOLN
10.0000 mL | INTRAMUSCULAR | Status: DC | PRN
  Administered 2013-12-13: 10 mL
  Filled 2013-12-13: qty 10

## 2013-12-13 NOTE — Patient Instructions (Signed)
[facility] Discharge Instructions for Patients Receiving Chemotherapy  Today you received the following chemotherapy agents: vectibix  To help prevent nausea and vomiting after your treatment, we encourage you to take your nausea medication.  Take it as often as prescribed.     If you develop nausea and vomiting that is not controlled by your nausea medication, call the clinic. If it is after clinic hours your family physician or the after hours number for the clinic or go to the Emergency Department.   BELOW ARE SYMPTOMS THAT SHOULD BE REPORTED IMMEDIATELY:  *FEVER GREATER THAN 100.5 F  *CHILLS WITH OR WITHOUT FEVER  NAUSEA AND VOMITING THAT IS NOT CONTROLLED WITH YOUR NAUSEA MEDICATION  *UNUSUAL SHORTNESS OF BREATH  *UNUSUAL BRUISING OR BLEEDING  TENDERNESS IN MOUTH AND THROAT WITH OR WITHOUT PRESENCE OF ULCERS  *URINARY PROBLEMS  *BOWEL PROBLEMS  UNUSUAL RASH Items with * indicate a potential emergency and should be followed up as soon as possible.  Feel free to call the clinic you have any questions or concerns. The clinic phone number is [phone].   I have been informed and understand all the instructions given to me. I know to contact the clinic, my physician, or go to the Emergency Department if any problems should occur. I do not have any questions at this time, but understand that I may call the clinic during office hours   should I have any questions or need assistance in obtaining follow up care.    __________________________________________  _____________  __________ Signature of Patient or Authorized Representative            Date                   Time    __________________________________________ Nurse's Signature

## 2013-12-13 NOTE — Progress Notes (Signed)
[place]    OFFICE PROGRESS NOTE   INTERVAL HISTORY:   She completed another treatment with irinotecan/panitumumab on 11/29/2013. She reports a left buccal and tongue ulcers following chemotherapy then have improved. No consistent abdominal pain. Intermittent reflux symptoms. She has dry skin.  Objective:  Vital signs in last 24 hours:  Blood pressure 135/60, pulse 69, temperature 97.9 F (36.6 C), temperature source Oral, resp. rate 18, height $RemoveBe'5\' 7"'[id number]$  (1.702 m), weight 146 lb 4.8 oz (66.361 kg).    HEENT: 3-4 mm superficial ulcer at the left side of the tongue, no buccal ulcers or thrush Resp: Lungs clear bilaterally Cardio: Regular rate and rhythm GI: No hepatomegaly, nontender Vascular: No leg edema  Skin: Dry skin over the trunk with mild cracking over the fingers   Portacath/PICC-without erythema  Lab Results:  Lab Results  Component Value Date   WBC 2.0* 12/13/2013   HGB 12.0 12/13/2013   HCT 37.5 12/13/2013   MCV 83.7 12/13/2013   PLT 56* 12/13/2013   NEUTROABS 1.3* 12/13/2013   CEA 5.0 on 11/15/2013   Medications: I have reviewed the patient's current medications.  Assessment/Plan: 1.Metastatic colon cancer with multiple liver metastases noted on an MRI of the abdomen 12/02/2010 and on a CT 12/13/2010. A transverse colon mass was noted on the CT from 02/11/2011 and confirmed on a colonoscopy 12/17/2010. She began treatment with FOLFIRI/Avastin 12/31/2010. A restaging CT 06/20/2011 confirmed further improvement in the metastatic liver lesions and no evidence for progressive metastatic disease. A restaging CT 08/01/2011 showed stable liver lesions and no new lesions. Restaging CT 09/12/2011 showed stable liver lesions and no new lesions. A restaging CT 10/27/2011 showed stable liver lesions and no new lesions. Restaging CT 12/05/2011 showed mild decrease in size of hepatic metastases and no evidence of disease progression. Restaging CT 01/26/2012 showed  stable hepatic metastases and no new lesions. Restaging CT may 5/8/ 2013 revealed a decreased size of hepatic metastases and no new lesions. Restaging CT 04/23/2012 revealed no change in the measurable "target" lesions, a slight increase in the size of other lesions, and no new lesions. Restaging CT 06/04/2012 showed multiple hepatic metastases were grossly unchanged. Restaging CT 07/19/2012 with stable disease in the liver. Infusional 5-fluorouracil continued on a 2 week schedule. Treatment was held after the 08/03/2012 cycle due to thrombocytopenia. Restaging CT evaluation 08/30/2012 with mild increase in size of large hepatic metastases. Treatment was resumed with 5 fluorouracil and Avastin on 08/31/2012. Restaging CT 10/14/2012-stable hepatic metastases. Treatment continued with 5 fluorouracil and Avastin. Restaging CT 12/01/2012 revealed mild progression in the size of multiple hepatic lesions. Treatment was placed on hold. CEA 32.1 on 01/06/2013 and 71.9 on 03/11/2013. Restaging CT abdomen/pelvis 03/31/2013 with interval progression of liver metastases. She began cycle 1 CAPOX on 04/15/2013. The Xeloda was prematurely discontinued on 04/25/2013 due to redness and pain over the feet. She completed cycle 2 beginning 05/24/2013 and cycle 3 beginning on 07/05/2013. Disease progression presenting with severe abdominal pain. She began irinotecan/Panitumumab 09/05/2013. CEA markedly improved at 5.0 on 11/15/2013. Restaging CT 11/25/2013 with improvement in hepatic metastases and abdominal lymphadenopathy 2. Abdominal pain-? Secondary to liver metastases, metastatic adenopathy, or the primary colon tumor. Less likely peptic ulcer disease. Improved.  3. Anorexia, early satiety, and weight loss secondary to metastatic colon cancer-resolved.  4. Microcytic anemia. Resolved.  5. History of migraine headaches.  6. Status post uterine fibroid surgery.  7. Port-A-Cath placement 12/20/2010.  8. Delayed nausea  following cycle 1  of FOLFIRI/Avastin. Improved with the addition of Aloxi and prophylactic Decadron beginning with cycle #2.  9. Neutropenia secondary to chemotherapy. Cycle #4 was held on 02/11/2011. She subsequently received Neulasta support.  10. History of intermittent fever with no apparent source for infection, likely "tumor fever" or fever related to chemotherapy. No recent fever.  11. History of mucositis secondary to 5-FU, initially grade 2. Irinotecan and 5-FU were dose reduced per study guidelines.  12. History of abdominal cramping. ? Related to irinotecan, 5-FU, or potentially the colon tumor. No recent abdominal cramping.  13. Thrombocytopenia secondary to chemotherapy (while being treated with irinotecan and Avastin). Treatment was held for thrombocytopenia per study guidelines. The thrombocytopenia may be related to Avastin, or chronic liver disease from chemotherapy. The CT on 04/23/2012 confirmed mild splenomegaly. The CT on 06/04/2012 again showed splenomegaly. The thrombocytopenia persisted despite being off of irinotecan and Avastin for several months. The thrombocytopenia improved. She developed thrombocytopenia following cycle 1 and cycle 2 CAPOX. She again has thrombocytopenia.  14. Acne-like rash on the face. Question related to steroids. Resolved.  99. Arthralgias-most likely unrelated to the colon cancer diagnosis and chemotherapy. She has been evaluated by orthopedics. The etiology of the arthralgias is unclear.  16. Persistent discomfort at the right shoulder. Negative CT of the right upper extremity on 07/19/2012. MRI on 08/06/2012 with a partial thickness bursal surface tear of the supraspinatus tendon, moderate rotator cuff tendinopathy/tendinosis. She is now followed by orthopedics. The pain has improved. The range of motion at the right shoulder has improved  17.? Swelling at the right upper extremity and chest wall April 2014-negative right upper extremity Doppler.  18.  Delayed nausea following cycle 1 CAPOX. Emend and Aloxi were added with cycle 2.  19. Mucositis and hand-foot syndrome secondary to Xeloda. The Xeloda was dose reduced to 1000 mg twice daily for 14 days with cycle 2.  20. Thrombocytopenia following cycle 1 CAPOX. Cycle 2 and cycle 3 were delayed due to thrombocytopenia. The oxaliplatin was further dose reduced with cycle 3.  21.New onset pruritus 04/04/2013-hyperbilirubinemia and an elevated alkaline phosphatase noted on labs 04/06/2013, the CT 03/31/2013 revealed peripheral biliary ductal dilatation in the medial segment of the left hepatic lobe,no options for intervention after evaluation by Dr. Watt Climes. The hyperbilirubinemia initially resolved and was mildly elevated on 07/26/2013.  22. Acute severe upper abdominal pain, pleuritic, with radiation to the chest/shoulders beginning 07/25/2013-etiology unclear. Chest CT 07/25/2013 showed no evidence of an acute pulmonary embolus and no acute or metastatic findings in the chest. CT abdomen/pelvis on 07/26/2013 showed interval slight progression of metastatic disease to the liver; no new hepatic metastases; slight progression of intrahepatic biliary ductal dilatation; interval marked increase in the size of the spleen; interval increase in the size of a portacaval lymph node and gastrohepatic ligament lymph nodes; contracted thickwalled gallbladder possibly containing sludge. The pain is likely related to metastatic disease involving the liver with possible irritation of the diaphragm. The pain has resolved.  23. Status post biopsy of a liver lesion 08/10/2013. Pathology confirmed metastatic adenocarcinoma. Extended K-ras testing returned negative for the K-ras mutation.  24. Constipation.  25. Panitumumab skin rash. Mild. She continues minocycline and moisturizer.  26. Insomnia, anxiety and restlessness following abrupt discontinuation of Percocet. Resolved after a slow Percocet taper.  27. Mucositis secondary  chemotherapy 28. Acute nausea during the irinotecan infusion, lasting for approximately 24 hours. Improved with addition of Ativan to the premedication regimen.  29. Erythema over Port-A-Cath site following application  of EMLA cream 11/01/2013-resolved    Disposition:  She appears stable. The plan is to continue irinotecan/panitumumab on a 2 week schedule. The irinotecan will be held today secondary to thrombocytopenia. She will receive panitumumab only. Ms. Goren will return for an office visit and chemotherapy in 2 weeks.   Betsy Coder, MD  12/13/2013  12:05 PM

## 2013-12-13 NOTE — Progress Notes (Signed)
Per Dr. Benay Spice, [place] to treat today with platelet count 56-will give only panitumumab today.

## 2013-12-13 NOTE — Progress Notes (Signed)
Vectibix only today due to counts, per Dr. Benay Spice

## 2013-12-13 NOTE — Telephone Encounter (Signed)
gv and printed appt sched and avs for pt for Feb and March....sed added tx. °

## 2013-12-25 ENCOUNTER — Other Ambulatory Visit: Payer: Self-pay | Admitting: Oncology

## 2013-12-27 ENCOUNTER — Ambulatory Visit (HOSPITAL_BASED_OUTPATIENT_CLINIC_OR_DEPARTMENT_OTHER): Payer: BC Managed Care – PPO

## 2013-12-27 ENCOUNTER — Other Ambulatory Visit (HOSPITAL_BASED_OUTPATIENT_CLINIC_OR_DEPARTMENT_OTHER): Payer: BC Managed Care – PPO

## 2013-12-27 ENCOUNTER — Ambulatory Visit (HOSPITAL_BASED_OUTPATIENT_CLINIC_OR_DEPARTMENT_OTHER): Payer: BC Managed Care – PPO | Admitting: Nurse Practitioner

## 2013-12-27 VITALS — BP 130/75 | HR 83

## 2013-12-27 VITALS — BP 114/69 | HR 74 | Temp 97.9°F | Resp 18 | Ht 67.0 in | Wt 147.1 lb

## 2013-12-27 DIAGNOSIS — C184 Malignant neoplasm of transverse colon: Secondary | ICD-10-CM

## 2013-12-27 DIAGNOSIS — C787 Secondary malignant neoplasm of liver and intrahepatic bile duct: Secondary | ICD-10-CM

## 2013-12-27 DIAGNOSIS — K1230 Oral mucositis (ulcerative), unspecified: Secondary | ICD-10-CM

## 2013-12-27 DIAGNOSIS — Z5112 Encounter for antineoplastic immunotherapy: Secondary | ICD-10-CM

## 2013-12-27 DIAGNOSIS — C189 Malignant neoplasm of colon, unspecified: Secondary | ICD-10-CM

## 2013-12-27 DIAGNOSIS — Z5111 Encounter for antineoplastic chemotherapy: Secondary | ICD-10-CM

## 2013-12-27 DIAGNOSIS — D696 Thrombocytopenia, unspecified: Secondary | ICD-10-CM

## 2013-12-27 DIAGNOSIS — K121 Other forms of stomatitis: Secondary | ICD-10-CM

## 2013-12-27 LAB — COMPREHENSIVE METABOLIC PANEL (CC13)
ALT: 32 U/L (ref 0–55)
ANION GAP: 7 meq/L (ref 3–11)
AST: 37 U/L — ABNORMAL HIGH (ref 5–34)
Albumin: 3.8 g/dL (ref 3.5–5.0)
Alkaline Phosphatase: 307 U/L — ABNORMAL HIGH (ref 40–150)
BUN: 12.3 mg/dL (ref 7.0–26.0)
CO2: 30 meq/L — AB (ref 22–29)
CREATININE: 0.7 mg/dL (ref 0.6–1.1)
Calcium: 9.6 mg/dL (ref 8.4–10.4)
Chloride: 108 mEq/L (ref 98–109)
GLUCOSE: 80 mg/dL (ref 70–140)
Potassium: 4.4 mEq/L (ref 3.5–5.1)
Sodium: 144 mEq/L (ref 136–145)
Total Bilirubin: 0.78 mg/dL (ref 0.20–1.20)
Total Protein: 7 g/dL (ref 6.4–8.3)

## 2013-12-27 LAB — CBC WITH DIFFERENTIAL/PLATELET
BASO%: 1 % (ref 0.0–2.0)
Basophils Absolute: 0 10*3/uL (ref 0.0–0.1)
EOS%: 5.4 % (ref 0.0–7.0)
Eosinophils Absolute: 0.2 10*3/uL (ref 0.0–0.5)
HEMATOCRIT: 39.5 % (ref 34.8–46.6)
HGB: 12.8 g/dL (ref 11.6–15.9)
LYMPH%: 13.8 % — ABNORMAL LOW (ref 14.0–49.7)
MCH: 27.4 pg (ref 25.1–34.0)
MCHC: 32.5 g/dL (ref 31.5–36.0)
MCV: 84.3 fL (ref 79.5–101.0)
MONO#: 0.3 10*3/uL (ref 0.1–0.9)
MONO%: 11.8 % (ref 0.0–14.0)
NEUT#: 2 10*3/uL (ref 1.5–6.5)
NEUT%: 68 % (ref 38.4–76.8)
Platelets: 66 10*3/uL — ABNORMAL LOW (ref 145–400)
RBC: 4.69 10*6/uL (ref 3.70–5.45)
RDW: 21.4 % — ABNORMAL HIGH (ref 11.2–14.5)
WBC: 2.9 10*3/uL — AB (ref 3.9–10.3)
lymph#: 0.4 10*3/uL — ABNORMAL LOW (ref 0.9–3.3)

## 2013-12-27 MED ORDER — PALONOSETRON HCL INJECTION 0.25 MG/5ML
0.2500 mg | Freq: Once | INTRAVENOUS | Status: AC
  Administered 2013-12-27: 0.25 mg via INTRAVENOUS

## 2013-12-27 MED ORDER — LORAZEPAM 2 MG/ML IJ SOLN
0.5000 mg | Freq: Once | INTRAMUSCULAR | Status: AC
  Administered 2013-12-27: 0.5 mg via INTRAVENOUS

## 2013-12-27 MED ORDER — SODIUM CHLORIDE 0.9 % IV SOLN
6.0000 mg/kg | Freq: Once | INTRAVENOUS | Status: AC
  Administered 2013-12-27: 420 mg via INTRAVENOUS
  Filled 2013-12-27: qty 21

## 2013-12-27 MED ORDER — PALONOSETRON HCL INJECTION 0.25 MG/5ML
INTRAVENOUS | Status: AC
  Filled 2013-12-27: qty 5

## 2013-12-27 MED ORDER — LORAZEPAM 2 MG/ML IJ SOLN
INTRAMUSCULAR | Status: AC
  Filled 2013-12-27: qty 1

## 2013-12-27 MED ORDER — IRINOTECAN HCL CHEMO INJECTION 100 MG/5ML
100.0000 mg/m2 | Freq: Once | INTRAVENOUS | Status: AC
  Administered 2013-12-27: 182 mg via INTRAVENOUS
  Filled 2013-12-27: qty 9.1

## 2013-12-27 MED ORDER — HEPARIN SOD (PORK) LOCK FLUSH 100 UNIT/ML IV SOLN
500.0000 [IU] | Freq: Once | INTRAVENOUS | Status: AC | PRN
  Administered 2013-12-27: 500 [IU]
  Filled 2013-12-27: qty 5

## 2013-12-27 MED ORDER — DEXAMETHASONE SODIUM PHOSPHATE 10 MG/ML IJ SOLN
INTRAMUSCULAR | Status: AC
  Filled 2013-12-27: qty 1

## 2013-12-27 MED ORDER — SODIUM CHLORIDE 0.9 % IJ SOLN
10.0000 mL | INTRAMUSCULAR | Status: DC | PRN
  Administered 2013-12-27: 10 mL
  Filled 2013-12-27: qty 10

## 2013-12-27 MED ORDER — DEXAMETHASONE SODIUM PHOSPHATE 10 MG/ML IJ SOLN
10.0000 mg | Freq: Once | INTRAMUSCULAR | Status: AC
  Administered 2013-12-27: 10 mg via INTRAVENOUS

## 2013-12-27 MED ORDER — ATROPINE SULFATE 1 MG/ML IJ SOLN: 0.5000 mg | Freq: Once | INTRAMUSCULAR | Status: DC | PRN

## 2013-12-27 MED ORDER — SODIUM CHLORIDE 0.9 % IV SOLN: Freq: Once | INTRAVENOUS | Status: DC

## 2013-12-27 MED ORDER — SODIUM CHLORIDE 0.9 % IV SOLN
Freq: Once | INTRAVENOUS | Status: AC
  Administered 2013-12-27: 13:00:00 via INTRAVENOUS

## 2013-12-27 NOTE — Progress Notes (Signed)
After patient was de accessed patient states she feels like her speech is slurred. Patient smiles symmetrically, patient able to push against resistance equally. VSS. Ned Card, NP notified. Orders given to hold patient and monitor, and then Ned Card, NP will come into infusion to check on her.

## 2013-12-27 NOTE — Patient Instructions (Signed)
[facility] Discharge Instructions for Patients Receiving Chemotherapy  Today you received the following chemotherapy agents Vectibix/Irinotecan.  To help prevent nausea and vomiting after your treatment, we encourage you to take your nausea medication as prescribed.   If you develop nausea and vomiting that is not controlled by your nausea medication, call the clinic.   BELOW ARE SYMPTOMS THAT SHOULD BE REPORTED IMMEDIATELY:  *FEVER GREATER THAN 100.5 F  *CHILLS WITH OR WITHOUT FEVER  NAUSEA AND VOMITING THAT IS NOT CONTROLLED WITH YOUR NAUSEA MEDICATION  *UNUSUAL SHORTNESS OF BREATH  *UNUSUAL BRUISING OR BLEEDING  TENDERNESS IN MOUTH AND THROAT WITH OR WITHOUT PRESENCE OF ULCERS  *URINARY PROBLEMS  *BOWEL PROBLEMS  UNUSUAL RASH Items with * indicate a potential emergency and should be followed up as soon as possible.  Feel free to call the clinic you have any questions or concerns. The clinic phone number is [phone].    

## 2013-12-27 NOTE — Progress Notes (Signed)
OFFICE PROGRESS NOTE  Interval history:  Natalie Mccarthy returns for scheduled followup of metastatic colon cancer. She was treated with Panitumumab on 12/13/2013. The irinotecan was held due to thrombocytopenia.  She overall feels well. She denies pain. No nausea or vomiting. She has a persistent ulceration at the left buccal mucosa which waxes and wanes. She intermittently notes a rash on her chin and upper chest. She has recently noted increased facial hair. She denies diarrhea. She denies any bleeding except rarely a small amount after straining for a bowel movement.   Objective: Filed Vitals:   12/27/13 1128  BP: 114/69  Pulse: 74  Temp: 97.9 F (36.6 C)  Resp: 18   Small patch of erythema at the left buccal mucosa. No ulcerations. Lungs clear. No wheezes or rales. Regular cardiac rhythm. Port-A-Cath site without erythema. Abdomen soft and nontender. No hepatomegaly. No leg edema. Calves nontender. Faint erythematous macular rash on the chin and upper chest.   Lab Results: Lab Results  Component Value Date   WBC 2.9* 12/27/2013   HGB 12.8 12/27/2013   HCT 39.5 12/27/2013   MCV 84.3 12/27/2013   PLT 66* 12/27/2013   NEUTROABS 2.0 12/27/2013    Chemistry:    Chemistry      Component Value Date/Time   NA 144 12/27/2013 1107   NA 136 07/25/2013 1345   NA 143 01/27/2012 1354   K 4.4 12/27/2013 1107   K 4.3 07/25/2013 1345   K 3.8 01/27/2012 1354   CL 99 07/25/2013 1345   CL 101 04/15/2013 1230   CL 97* 01/27/2012 1354   CO2 30* 12/27/2013 1107   CO2 27 07/25/2013 1345   CO2 29 01/27/2012 1354   BUN 12.3 12/27/2013 1107   BUN 12 07/25/2013 1345   BUN 11 01/27/2012 1354   CREATININE 0.7 12/27/2013 1107   CREATININE 0.71 07/25/2013 1345   CREATININE 0.9 01/27/2012 1354      Component Value Date/Time   CALCIUM 9.6 12/27/2013 1107   CALCIUM 9.9 07/25/2013 1345   CALCIUM 8.9 01/27/2012 1354   ALKPHOS 307* 12/27/2013 1107   ALKPHOS 500* 04/13/2013 0600   ALKPHOS 198* 01/27/2012 1354   AST 37*  12/27/2013 1107   AST 214* 04/13/2013 0600   AST 34 01/27/2012 1354   ALT 32 12/27/2013 1107   ALT 220* 04/13/2013 0600   ALT 41 01/27/2012 1354   BILITOT 0.78 12/27/2013 1107   BILITOT 3.9* 04/13/2013 0600   BILITOT 0.50 01/27/2012 1354       Studies/Results: No results found.  Medications: I have reviewed the patient's current medications.  Assessment/Plan: 1.Metastatic colon cancer with multiple liver metastases noted on an MRI of the abdomen 12/02/2010 and on a CT 12/13/2010. A transverse colon mass was noted on the CT from 02/11/2011 and confirmed on a colonoscopy 12/17/2010. She began treatment with FOLFIRI/Avastin 12/31/2010. A restaging CT 06/20/2011 confirmed further improvement in the metastatic liver lesions and no evidence for progressive metastatic disease. A restaging CT 08/01/2011 showed stable liver lesions and no new lesions. Restaging CT 09/12/2011 showed stable liver lesions and no new lesions. A restaging CT 10/27/2011 showed stable liver lesions and no new lesions. Restaging CT 12/05/2011 showed mild decrease in size of hepatic metastases and no evidence of disease progression. Restaging CT 01/26/2012 showed stable hepatic metastases and no new lesions. Restaging CT may 5/8/ 2013 revealed a decreased size of hepatic metastases and no new lesions. Restaging CT 04/23/2012 revealed no change in the measurable "target" lesions,  a slight increase in the size of other lesions, and no new lesions. Restaging CT 06/04/2012 showed multiple hepatic metastases were grossly unchanged. Restaging CT 07/19/2012 with stable disease in the liver. Infusional 5-fluorouracil continued on a 2 week schedule. Treatment was held after the 08/03/2012 cycle due to thrombocytopenia. Restaging CT evaluation 08/30/2012 with mild increase in size of large hepatic metastases. Treatment was resumed with 5 fluorouracil and Avastin on 08/31/2012. Restaging CT 10/14/2012-stable hepatic metastases. Treatment continued  with 5 fluorouracil and Avastin. Restaging CT 12/01/2012 revealed mild progression in the size of multiple hepatic lesions. Treatment was placed on hold. CEA 32.1 on 01/06/2013 and 71.9 on 03/11/2013. Restaging CT abdomen/pelvis 03/31/2013 with interval progression of liver metastases. She began cycle 1 CAPOX on 04/15/2013. The Xeloda was prematurely discontinued on 04/25/2013 due to redness and pain over the feet. She completed cycle 2 beginning 05/24/2013 and cycle 3 beginning on 07/05/2013. Disease progression presenting with severe abdominal pain. She began irinotecan/Panitumumab 09/05/2013. CEA markedly improved at 5.0 on 11/15/2013.  Restaging CT 11/25/2013 with improvement in hepatic metastases and abdominal lymphadenopathy. Irinotecan/Panitumumab continued. 2. Abdominal pain-? Secondary to liver metastases, metastatic adenopathy, or the primary colon tumor. Less likely peptic ulcer disease. Improved.  3. Anorexia, early satiety, and weight loss secondary to metastatic colon cancer-resolved.  4. Microcytic anemia. Resolved.  5. History of migraine headaches.  6. Status post uterine fibroid surgery.  7. Port-A-Cath placement 12/20/2010.  8. Delayed nausea following cycle 1 of FOLFIRI/Avastin. Improved with the addition of Aloxi and prophylactic Decadron beginning with cycle #2.  9. Neutropenia secondary to chemotherapy. Cycle #4 was held on 02/11/2011. She subsequently received Neulasta support.  10. History of intermittent fever with no apparent source for infection, likely "tumor fever" or fever related to chemotherapy. No recent fever.  11. History of mucositis secondary to 5-FU, initially grade 2. Irinotecan and 5-FU were dose reduced per study guidelines.  12. History of abdominal cramping. ? Related to irinotecan, 5-FU, or potentially the colon tumor. No recent abdominal cramping.  13. Thrombocytopenia secondary to chemotherapy (while being treated with irinotecan and Avastin). Treatment  was held for thrombocytopenia per study guidelines. The thrombocytopenia may be related to Avastin, or chronic liver disease from chemotherapy. The CT on 04/23/2012 confirmed mild splenomegaly. The CT on 06/04/2012 again showed splenomegaly. The thrombocytopenia persisted despite being off of irinotecan and Avastin for several months. The thrombocytopenia improved. She developed thrombocytopenia following cycle 1 and cycle 2 CAPOX. She again has thrombocytopenia.  14. Acne-like rash on the face. Question related to steroids. Resolved.  23. Arthralgias-most likely unrelated to the colon cancer diagnosis and chemotherapy. She has been evaluated by orthopedics. The etiology of the arthralgias is unclear.  16. Persistent discomfort at the right shoulder. Negative CT of the right upper extremity on 07/19/2012. MRI on 08/06/2012 with a partial thickness bursal surface tear of the supraspinatus tendon, moderate rotator cuff tendinopathy/tendinosis. She is now followed by orthopedics. The pain has improved. The range of motion at the right shoulder has improved  17.? Swelling at the right upper extremity and chest wall April 2014-negative right upper extremity Doppler.  18. Delayed nausea following cycle 1 CAPOX. Emend and Aloxi were added with cycle 2.  19. Mucositis and hand-foot syndrome secondary to Xeloda. The Xeloda was dose reduced to 1000 mg twice daily for 14 days with cycle 2.  20. Thrombocytopenia following cycle 1 CAPOX. Cycle 2 and cycle 3 were delayed due to thrombocytopenia. The oxaliplatin was further dose reduced with cycle  3.  21.New onset pruritus 04/04/2013-hyperbilirubinemia and an elevated alkaline phosphatase noted on labs 04/06/2013, the CT 03/31/2013 revealed peripheral biliary ductal dilatation in the medial segment of the left hepatic lobe,no options for intervention after evaluation by Dr. Watt Climes. The hyperbilirubinemia initially resolved and was mildly elevated on 07/26/2013.  22. Acute  severe upper abdominal pain, pleuritic, with radiation to the chest/shoulders beginning 07/25/2013-etiology unclear. Chest CT 07/25/2013 showed no evidence of an acute pulmonary embolus and no acute or metastatic findings in the chest. CT abdomen/pelvis on 07/26/2013 showed interval slight progression of metastatic disease to the liver; no new hepatic metastases; slight progression of intrahepatic biliary ductal dilatation; interval marked increase in the size of the spleen; interval increase in the size of a portacaval lymph node and gastrohepatic ligament lymph nodes; contracted thickwalled gallbladder possibly containing sludge. The pain is likely related to metastatic disease involving the liver with possible irritation of the diaphragm. The pain has resolved.  23. Status post biopsy of a liver lesion 08/10/2013. Pathology confirmed metastatic adenocarcinoma. Extended K-ras testing returned negative for the K-ras mutation.  24. Constipation.  25. Panitumumab skin rash. Mild. She continues minocycline and moisturizer.  26. Insomnia, anxiety and restlessness following abrupt discontinuation of Percocet. Resolved after a slow Percocet taper.  27. Mucositis secondary chemotherapy  28. Acute nausea during the irinotecan infusion, lasting for approximately 24 hours. Improved with addition of Ativan to the premedication regimen.  29. Erythema over Port-A-Cath site following application of EMLA cream 11/01/2013-resolved   Dispositon-she appears stable. Plan to continue irinotecan/Panitumumab on a 2 week schedule. We will see her in followup in 4 weeks. She will contact the office in the interim with any problems. We specifically discussed fever, other signs of infection, bleeding.  Plan reviewed with Dr. Benay Spice.   Ned Card ANP/GNP-BC

## 2013-12-27 NOTE — Progress Notes (Signed)
[address] did not see pt.  Checked with Dr Julien Nordmann & if pt has not gotten worse & no other symptoms may go home.  Pt still feels like speech is slurred some but not worse & husband agrees.  No other symptoms noted.  Grip equal bilaterally, & pushes equally with feet, no drooping of mouth/tongue, PEARL.  Dr. Julien Nordmann feels that it is probably medication related & informed per his instructions to go home & rest & if symptoms are worse to go to [facility] ED.  She expressed understanding & was informed of symptoms of stroke.

## 2013-12-28 ENCOUNTER — Telehealth: Payer: Self-pay | Admitting: *Deleted

## 2013-12-28 LAB — CEA: CEA: 8.2 ng/mL — AB (ref 0.0–5.0)

## 2013-12-28 NOTE — Telephone Encounter (Addendum)
Call from pt, she viewed CEA result online, asks if she should be concerned that it has risen? Reviewed with Dr. Benay Spice: CEA is slightly elevated, will fluctuated. Not concerned especially since Irinotecan has been held a few times. Left message on voicemail requesting pt call office to discuss lab. Pt returned call, labs discussed. She voiced understanding.

## 2013-12-30 ENCOUNTER — Telehealth: Payer: Self-pay | Admitting: *Deleted

## 2013-12-30 MED ORDER — TRAMADOL HCL 50 MG PO TABS: 50.0000 mg | ORAL_TABLET | Freq: Four times a day (QID) | ORAL | Status: DC | PRN

## 2013-12-30 NOTE — Telephone Encounter (Signed)
VM asking if refill request for Tramadol was received? Going out of town today. Called and left message on cell/home # that script was called in.

## 2014-01-06 ENCOUNTER — Other Ambulatory Visit: Payer: Self-pay | Admitting: Oncology

## 2014-01-10 ENCOUNTER — Other Ambulatory Visit (HOSPITAL_BASED_OUTPATIENT_CLINIC_OR_DEPARTMENT_OTHER): Payer: BC Managed Care – PPO

## 2014-01-10 ENCOUNTER — Ambulatory Visit (HOSPITAL_BASED_OUTPATIENT_CLINIC_OR_DEPARTMENT_OTHER): Payer: BC Managed Care – PPO

## 2014-01-10 ENCOUNTER — Telehealth: Payer: Self-pay | Admitting: *Deleted

## 2014-01-10 VITALS — BP 115/62 | HR 74 | Temp 97.1°F | Resp 18

## 2014-01-10 DIAGNOSIS — C787 Secondary malignant neoplasm of liver and intrahepatic bile duct: Secondary | ICD-10-CM

## 2014-01-10 DIAGNOSIS — C184 Malignant neoplasm of transverse colon: Secondary | ICD-10-CM

## 2014-01-10 DIAGNOSIS — C189 Malignant neoplasm of colon, unspecified: Secondary | ICD-10-CM

## 2014-01-10 DIAGNOSIS — R790 Abnormal level of blood mineral: Secondary | ICD-10-CM

## 2014-01-10 DIAGNOSIS — Z5111 Encounter for antineoplastic chemotherapy: Secondary | ICD-10-CM

## 2014-01-10 DIAGNOSIS — Z5112 Encounter for antineoplastic immunotherapy: Secondary | ICD-10-CM

## 2014-01-10 LAB — CBC WITH DIFFERENTIAL/PLATELET
BASO%: 1 % (ref 0.0–2.0)
Basophils Absolute: 0 10*3/uL (ref 0.0–0.1)
EOS%: 4 % (ref 0.0–7.0)
Eosinophils Absolute: 0.1 10*3/uL (ref 0.0–0.5)
HEMATOCRIT: 37.1 % (ref 34.8–46.6)
HGB: 12 g/dL (ref 11.6–15.9)
LYMPH#: 0.3 10*3/uL — AB (ref 0.9–3.3)
LYMPH%: 13.6 % — ABNORMAL LOW (ref 14.0–49.7)
MCH: 27.3 pg (ref 25.1–34.0)
MCHC: 32.3 g/dL (ref 31.5–36.0)
MCV: 84.3 fL (ref 79.5–101.0)
MONO#: 0.2 10*3/uL (ref 0.1–0.9)
MONO%: 9.6 % (ref 0.0–14.0)
NEUT#: 1.4 10*3/uL — ABNORMAL LOW (ref 1.5–6.5)
NEUT%: 71.8 % (ref 38.4–76.8)
Platelets: 68 10*3/uL — ABNORMAL LOW (ref 145–400)
RBC: 4.4 10*6/uL (ref 3.70–5.45)
RDW: 17.7 % — AB (ref 11.2–14.5)
WBC: 2 10*3/uL — ABNORMAL LOW (ref 3.9–10.3)
nRBC: 0 % (ref 0–0)

## 2014-01-10 LAB — COMPREHENSIVE METABOLIC PANEL (CC13)
ALK PHOS: 321 U/L — AB (ref 40–150)
ALT: 27 U/L (ref 0–55)
AST: 31 U/L (ref 5–34)
Albumin: 3.7 g/dL (ref 3.5–5.0)
Anion Gap: 8 mEq/L (ref 3–11)
BILIRUBIN TOTAL: 0.55 mg/dL (ref 0.20–1.20)
BUN: 15.3 mg/dL (ref 7.0–26.0)
CO2: 27 mEq/L (ref 22–29)
CREATININE: 0.7 mg/dL (ref 0.6–1.1)
Calcium: 9.4 mg/dL (ref 8.4–10.4)
Chloride: 109 mEq/L (ref 98–109)
GLUCOSE: 99 mg/dL (ref 70–140)
Potassium: 4.2 mEq/L (ref 3.5–5.1)
SODIUM: 144 meq/L (ref 136–145)
Total Protein: 6.9 g/dL (ref 6.4–8.3)

## 2014-01-10 LAB — MAGNESIUM (CC13): Magnesium: 1.4 mg/dl — CL (ref 1.5–2.5)

## 2014-01-10 MED ORDER — HEPARIN SOD (PORK) LOCK FLUSH 100 UNIT/ML IV SOLN
500.0000 [IU] | Freq: Once | INTRAVENOUS | Status: AC | PRN
  Administered 2014-01-10: 500 [IU]
  Filled 2014-01-10: qty 5

## 2014-01-10 MED ORDER — SODIUM CHLORIDE 0.9 % IV SOLN
6.0000 mg/kg | Freq: Once | INTRAVENOUS | Status: AC
  Administered 2014-01-10: 420 mg via INTRAVENOUS
  Filled 2014-01-10: qty 21

## 2014-01-10 MED ORDER — PALONOSETRON HCL INJECTION 0.25 MG/5ML
INTRAVENOUS | Status: AC
  Filled 2014-01-10: qty 5

## 2014-01-10 MED ORDER — SODIUM CHLORIDE 0.9 % IV SOLN
Freq: Once | INTRAVENOUS | Status: AC
  Administered 2014-01-10: 12:00:00 via INTRAVENOUS

## 2014-01-10 MED ORDER — ATROPINE SULFATE 1 MG/ML IJ SOLN: 0.5000 mg | Freq: Once | INTRAMUSCULAR | Status: DC | PRN

## 2014-01-10 MED ORDER — IRINOTECAN HCL CHEMO INJECTION 100 MG/5ML
100.0000 mg/m2 | Freq: Once | INTRAVENOUS | Status: AC
  Administered 2014-01-10: 182 mg via INTRAVENOUS
  Filled 2014-01-10: qty 9.1

## 2014-01-10 MED ORDER — SODIUM CHLORIDE 0.9 % IJ SOLN
10.0000 mL | INTRAMUSCULAR | Status: DC | PRN
  Administered 2014-01-10: 10 mL
  Filled 2014-01-10: qty 10

## 2014-01-10 MED ORDER — MAGNESIUM OXIDE 400 (241.3 MG) MG PO TABS
400.0000 mg | ORAL_TABLET | Freq: Two times a day (BID) | ORAL | Status: DC
Start: 2014-01-10 — End: 2014-01-24

## 2014-01-10 MED ORDER — PALONOSETRON HCL INJECTION 0.25 MG/5ML
0.2500 mg | Freq: Once | INTRAVENOUS | Status: AC
  Administered 2014-01-10: 0.25 mg via INTRAVENOUS

## 2014-01-10 MED ORDER — ATROPINE SULFATE 1 MG/ML IJ SOLN
INTRAMUSCULAR | Status: AC
  Filled 2014-01-10: qty 1

## 2014-01-10 MED ORDER — DEXAMETHASONE SODIUM PHOSPHATE 10 MG/ML IJ SOLN
10.0000 mg | Freq: Once | INTRAMUSCULAR | Status: AC
  Administered 2014-01-10: 10 mg via INTRAVENOUS

## 2014-01-10 MED ORDER — DEXAMETHASONE SODIUM PHOSPHATE 10 MG/ML IJ SOLN
INTRAMUSCULAR | Status: AC
  Filled 2014-01-10: qty 1

## 2014-01-10 NOTE — Patient Instructions (Signed)
[facility] Discharge Instructions for Patients Receiving Chemotherapy  Today you received the following chemotherapy agents :  Vectibix,  Camptosar.  To help prevent nausea and vomiting after your treatment, we encourage you to take your nausea medication as instructed by your physician.   If you develop nausea and vomiting that is not controlled by your nausea medication, call the clinic.   BELOW ARE SYMPTOMS THAT SHOULD BE REPORTED IMMEDIATELY:  *FEVER GREATER THAN 100.5 F  *CHILLS WITH OR WITHOUT FEVER  NAUSEA AND VOMITING THAT IS NOT CONTROLLED WITH YOUR NAUSEA MEDICATION  *UNUSUAL SHORTNESS OF BREATH  *UNUSUAL BRUISING OR BLEEDING  TENDERNESS IN MOUTH AND THROAT WITH OR WITHOUT PRESENCE OF ULCERS  *URINARY PROBLEMS  *BOWEL PROBLEMS  UNUSUAL RASH Items with * indicate a potential emergency and should be followed up as soon as possible.  Feel free to call the clinic you have any questions or concerns. The clinic phone number is [phone].

## 2014-01-10 NOTE — Telephone Encounter (Signed)
Spoke with patient in infusion regarding lab results and informed a prescription for magnesium oxide has been sent to her pharmacy.  Per Elby Showers. Thomas.  Patient verbalized understanding.

## 2014-01-10 NOTE — Progress Notes (Signed)
Lattie Haw, NP reviewed lab results today.  Proceed with chemo as planned.  Lattie Haw, NP saw pt in infusion for facial rash from Vectibix.

## 2014-01-10 NOTE — Telephone Encounter (Signed)
   Provider input needed: Vectibix rash that's painful   Reason for call: Reporting symptoms of rash as instructed  Ears, nose, mouth, throat, and face: positive for Red, painful rash to face woke up 01-06-2014 with redness to chin   Patient last received chemotherapy/ treatment on Camptosar, vectibix on 12-27-2013  Patient was last seen in the office on 12-27-2013  Next appt is f/u on 01-24-2014.  Chemotherapy today.  Is patient having fevers greater than 100.5?  no   Is patient having uncontrolled pain, or new pain? no, taking OTC for pain   Is patient having new back pain that changes with position (worsens or eases when laying down?)  no   Is patient able to eat and drink? yes    Is patient able to pass stool without difficulty?   yes     Is patient having uncontrolled nausea?  no    patient calls 01/10/2014 with complaint of  Ears, nose, mouth, throat, and face: positive for facial rash "Red, painful, raised, pimple-like, white heads that are very unattractive.  Twice I accidently skipped the second dose of minocycline."   Summary Based on the above information advised patient to  Come in for today's lab and treatment and we'll advise when she arrives.   [place], Natalie Mccarthy  01/10/2014, 10:46 AM   Background Info  Natalie Mccarthy   DOB: 04-04-1956   MR#: [id number]   CSN#   [id number] 01/10/2014

## 2014-01-22 ENCOUNTER — Other Ambulatory Visit: Payer: Self-pay | Admitting: Oncology

## 2014-01-24 ENCOUNTER — Telehealth: Payer: Self-pay | Admitting: Oncology

## 2014-01-24 ENCOUNTER — Ambulatory Visit (HOSPITAL_BASED_OUTPATIENT_CLINIC_OR_DEPARTMENT_OTHER): Payer: BC Managed Care – PPO | Admitting: Nurse Practitioner

## 2014-01-24 ENCOUNTER — Ambulatory Visit (HOSPITAL_BASED_OUTPATIENT_CLINIC_OR_DEPARTMENT_OTHER): Payer: BC Managed Care – PPO

## 2014-01-24 ENCOUNTER — Other Ambulatory Visit (HOSPITAL_BASED_OUTPATIENT_CLINIC_OR_DEPARTMENT_OTHER): Payer: BC Managed Care – PPO

## 2014-01-24 VITALS — BP 135/82 | HR 75 | Temp 97.9°F | Resp 19 | Ht 67.0 in | Wt 145.6 lb

## 2014-01-24 DIAGNOSIS — R109 Unspecified abdominal pain: Secondary | ICD-10-CM

## 2014-01-24 DIAGNOSIS — C189 Malignant neoplasm of colon, unspecified: Secondary | ICD-10-CM

## 2014-01-24 DIAGNOSIS — C184 Malignant neoplasm of transverse colon: Secondary | ICD-10-CM

## 2014-01-24 DIAGNOSIS — Z5111 Encounter for antineoplastic chemotherapy: Secondary | ICD-10-CM

## 2014-01-24 DIAGNOSIS — R790 Abnormal level of blood mineral: Secondary | ICD-10-CM

## 2014-01-24 DIAGNOSIS — R21 Rash and other nonspecific skin eruption: Secondary | ICD-10-CM

## 2014-01-24 DIAGNOSIS — C787 Secondary malignant neoplasm of liver and intrahepatic bile duct: Secondary | ICD-10-CM

## 2014-01-24 LAB — COMPREHENSIVE METABOLIC PANEL (CC13)
ALBUMIN: 3.6 g/dL (ref 3.5–5.0)
ALT: 30 U/L (ref 0–55)
AST: 36 U/L — AB (ref 5–34)
Alkaline Phosphatase: 380 U/L — ABNORMAL HIGH (ref 40–150)
Anion Gap: 8 mEq/L (ref 3–11)
BILIRUBIN TOTAL: 0.88 mg/dL (ref 0.20–1.20)
BUN: 11 mg/dL (ref 7.0–26.0)
CO2: 28 mEq/L (ref 22–29)
Calcium: 9.5 mg/dL (ref 8.4–10.4)
Chloride: 106 mEq/L (ref 98–109)
Creatinine: 0.7 mg/dL (ref 0.6–1.1)
Glucose: 103 mg/dl (ref 70–140)
POTASSIUM: 4.4 meq/L (ref 3.5–5.1)
SODIUM: 142 meq/L (ref 136–145)
Total Protein: 6.9 g/dL (ref 6.4–8.3)

## 2014-01-24 LAB — CBC WITH DIFFERENTIAL/PLATELET
BASO%: 0.6 % (ref 0.0–2.0)
Basophils Absolute: 0 10*3/uL (ref 0.0–0.1)
EOS%: 4.5 % (ref 0.0–7.0)
Eosinophils Absolute: 0.1 10*3/uL (ref 0.0–0.5)
HCT: 37.3 % (ref 34.8–46.6)
HGB: 12.1 g/dL (ref 11.6–15.9)
LYMPH%: 11.5 % — AB (ref 14.0–49.7)
MCH: 27 pg (ref 25.1–34.0)
MCHC: 32.3 g/dL (ref 31.5–36.0)
MCV: 83.4 fL (ref 79.5–101.0)
MONO#: 0.4 10*3/uL (ref 0.1–0.9)
MONO%: 11.3 % (ref 0.0–14.0)
NEUT#: 2.3 10*3/uL (ref 1.5–6.5)
NEUT%: 72.1 % (ref 38.4–76.8)
Platelets: 76 10*3/uL — ABNORMAL LOW (ref 145–400)
RBC: 4.48 10*6/uL (ref 3.70–5.45)
RDW: 18.7 % — AB (ref 11.2–14.5)
WBC: 3.2 10*3/uL — ABNORMAL LOW (ref 3.9–10.3)
lymph#: 0.4 10*3/uL — ABNORMAL LOW (ref 0.9–3.3)

## 2014-01-24 LAB — MAGNESIUM (CC13): MAGNESIUM: 1.6 mg/dL (ref 1.5–2.5)

## 2014-01-24 MED ORDER — LORAZEPAM 2 MG/ML IJ SOLN
INTRAMUSCULAR | Status: AC
  Filled 2014-01-24: qty 1

## 2014-01-24 MED ORDER — FLUTICASONE PROPIONATE 0.05 % EX CREA: TOPICAL_CREAM | Freq: Two times a day (BID) | CUTANEOUS | Status: AC | PRN

## 2014-01-24 MED ORDER — MAGNESIUM OXIDE 400 (241.3 MG) MG PO TABS: 400.0000 mg | ORAL_TABLET | Freq: Two times a day (BID) | ORAL | Status: DC

## 2014-01-24 MED ORDER — SODIUM CHLORIDE 0.9 % IJ SOLN
10.0000 mL | INTRAMUSCULAR | Status: DC | PRN
  Administered 2014-01-24: 10 mL
  Filled 2014-01-24: qty 10

## 2014-01-24 MED ORDER — IRINOTECAN HCL CHEMO INJECTION 100 MG/5ML
100.0000 mg/m2 | Freq: Once | INTRAVENOUS | Status: AC
  Administered 2014-01-24: 182 mg via INTRAVENOUS
  Filled 2014-01-24: qty 9.1

## 2014-01-24 MED ORDER — HEPARIN SOD (PORK) LOCK FLUSH 100 UNIT/ML IV SOLN
500.0000 [IU] | Freq: Once | INTRAVENOUS | Status: AC | PRN
  Administered 2014-01-24: 500 [IU]
  Filled 2014-01-24: qty 5

## 2014-01-24 MED ORDER — PALONOSETRON HCL INJECTION 0.25 MG/5ML
INTRAVENOUS | Status: AC
  Filled 2014-01-24: qty 5

## 2014-01-24 MED ORDER — DEXAMETHASONE SODIUM PHOSPHATE 10 MG/ML IJ SOLN
INTRAMUSCULAR | Status: AC
  Filled 2014-01-24: qty 1

## 2014-01-24 MED ORDER — SODIUM CHLORIDE 0.9 % IV SOLN
Freq: Once | INTRAVENOUS | Status: AC
  Administered 2014-01-24: 13:00:00 via INTRAVENOUS

## 2014-01-24 MED ORDER — PANITUMUMAB CHEMO INJECTION 100 MG/5ML
6.0000 mg/kg | Freq: Once | INTRAVENOUS | Status: AC
  Administered 2014-01-24: 420 mg via INTRAVENOUS
  Filled 2014-01-24: qty 21

## 2014-01-24 MED ORDER — LORAZEPAM 2 MG/ML IJ SOLN
0.5000 mg | Freq: Once | INTRAMUSCULAR | Status: AC
  Administered 2014-01-24: 0.5 mg via INTRAVENOUS

## 2014-01-24 MED ORDER — DEXAMETHASONE SODIUM PHOSPHATE 10 MG/ML IJ SOLN
10.0000 mg | Freq: Once | INTRAMUSCULAR | Status: AC
Start: 2014-01-24 — End: 2014-01-24
  Administered 2014-01-24: 10 mg via INTRAVENOUS

## 2014-01-24 MED ORDER — PALONOSETRON HCL INJECTION 0.25 MG/5ML
0.2500 mg | Freq: Once | INTRAVENOUS | Status: AC
  Administered 2014-01-24: 0.25 mg via INTRAVENOUS

## 2014-01-24 NOTE — Telephone Encounter (Signed)
gv adn printed appt sched and avs for pt for April...sed added tx... °

## 2014-01-24 NOTE — Progress Notes (Signed)
OFFICE PROGRESS NOTE  Interval history:  Natalie Mccarthy returns for followup of metastatic colon cancer. She was last treated with irinotecan/Panitumumab on 01/10/2014. She had some nausea, no different than with previous cycles, lasting a few days. Novomiting. She denies diarrhea. No mouth sores. She notes progression of the skin rash most notably on her face and neck. She continues minocycline and is applying over-the-counter hydrocortisone ointment. Over the past weekend she developed a "sharp" abdominal pain mainly right-sided. The pain improved over the next 48 hours. There was no associated shortness of breath, cough or pleuritic component.   Objective: Filed Vitals:   01/24/14 1156  BP: 135/82  Pulse: 75  Temp: 97.9 F (36.6 C)  Resp: 19   Oropharynx is without thrush or ulceration. Lungs are clear. Regular cardiac rhythm. Port-A-Cath site without erythema. Abdomen soft and nontender. No hepatomegaly. No mass. No leg edema. Acne-like rash scattered over the face, neck and upper chest with a few lesions on the extremities. The rash is scabbed in some areas.   Lab Results: Lab Results  Component Value Date   WBC 3.2* 01/24/2014   HGB 12.1 01/24/2014   HCT 37.3 01/24/2014   MCV 83.4 01/24/2014   PLT 76* 01/24/2014   NEUTROABS 2.3 01/24/2014    Chemistry:    Chemistry      Component Value Date/Time   NA 142 01/24/2014 1116   NA 136 07/25/2013 1345   NA 143 01/27/2012 1354   K 4.4 01/24/2014 1116   K 4.3 07/25/2013 1345   K 3.8 01/27/2012 1354   CL 99 07/25/2013 1345   CL 101 04/15/2013 1230   CL 97* 01/27/2012 1354   CO2 28 01/24/2014 1116   CO2 27 07/25/2013 1345   CO2 29 01/27/2012 1354   BUN 11.0 01/24/2014 1116   BUN 12 07/25/2013 1345   BUN 11 01/27/2012 1354   CREATININE 0.7 01/24/2014 1116   CREATININE 0.71 07/25/2013 1345   CREATININE 0.9 01/27/2012 1354      Component Value Date/Time   CALCIUM 9.5 01/24/2014 1116   CALCIUM 9.9 07/25/2013 1345   CALCIUM 8.9 01/27/2012 1354    ALKPHOS 380* 01/24/2014 1116   ALKPHOS 500* 04/13/2013 0600   ALKPHOS 198* 01/27/2012 1354   AST 36* 01/24/2014 1116   AST 214* 04/13/2013 0600   AST 34 01/27/2012 1354   ALT 30 01/24/2014 1116   ALT 220* 04/13/2013 0600   ALT 41 01/27/2012 1354   BILITOT 0.88 01/24/2014 1116   BILITOT 3.9* 04/13/2013 0600   BILITOT 0.50 01/27/2012 1354       Studies/Results: No results found.  Medications: I have reviewed the patient's current medications.  Assessment/Plan: 1.Metastatic colon cancer with multiple liver metastases noted on an MRI of the abdomen 12/02/2010 and on a CT 12/13/2010. A transverse colon mass was noted on the CT from 02/11/2011 and confirmed on a colonoscopy 12/17/2010. She began treatment with FOLFIRI/Avastin 12/31/2010. A restaging CT 06/20/2011 confirmed further improvement in the metastatic liver lesions and no evidence for progressive metastatic disease. A restaging CT 08/01/2011 showed stable liver lesions and no new lesions. Restaging CT 09/12/2011 showed stable liver lesions and no new lesions. A restaging CT 10/27/2011 showed stable liver lesions and no new lesions. Restaging CT 12/05/2011 showed mild decrease in size of hepatic metastases and no evidence of disease progression. Restaging CT 01/26/2012 showed stable hepatic metastases and no new lesions. Restaging CT may 5/8/ 2013 revealed a decreased size of hepatic metastases and no new  lesions. Restaging CT 04/23/2012 revealed no change in the measurable "target" lesions, a slight increase in the size of other lesions, and no new lesions. Restaging CT 06/04/2012 showed multiple hepatic metastases were grossly unchanged. Restaging CT 07/19/2012 with stable disease in the liver. Infusional 5-fluorouracil continued on a 2 week schedule. Treatment was held after the 08/03/2012 cycle due to thrombocytopenia. Restaging CT evaluation 08/30/2012 with mild increase in size of large hepatic metastases. Treatment was resumed with 5 fluorouracil  and Avastin on 08/31/2012. Restaging CT 10/14/2012-stable hepatic metastases. Treatment continued with 5 fluorouracil and Avastin. Restaging CT 12/01/2012 revealed mild progression in the size of multiple hepatic lesions. Treatment was placed on hold. CEA 32.1 on 01/06/2013 and 71.9 on 03/11/2013. Restaging CT abdomen/pelvis 03/31/2013 with interval progression of liver metastases. She began cycle 1 CAPOX on 04/15/2013. The Xeloda was prematurely discontinued on 04/25/2013 due to redness and pain over the feet. She completed cycle 2 beginning 05/24/2013 and cycle 3 beginning on 07/05/2013. Disease progression presenting with severe abdominal pain. She began irinotecan/Panitumumab 09/05/2013. CEA markedly improved at 5.0 on 11/15/2013.  Restaging CT 11/25/2013 with improvement in hepatic metastases and abdominal lymphadenopathy.  Irinotecan/Panitumumab continued. 2. Abdominal pain-? Secondary to liver metastases, metastatic adenopathy, or the primary colon tumor. Less likely peptic ulcer disease. Improved.  3. Anorexia, early satiety, and weight loss secondary to metastatic colon cancer-resolved.  4. Microcytic anemia. Resolved.  5. History of migraine headaches.  6. Status post uterine fibroid surgery.  7. Port-A-Cath placement 12/20/2010.  8. Delayed nausea following cycle 1 of FOLFIRI/Avastin. Improved with the addition of Aloxi and prophylactic Decadron beginning with cycle #2.  9. Neutropenia secondary to chemotherapy. Cycle #4 was held on 02/11/2011. She subsequently received Neulasta support.  10. History of intermittent fever with no apparent source for infection, likely "tumor fever" or fever related to chemotherapy. No recent fever.  11. History of mucositis secondary to 5-FU, initially grade 2. Irinotecan and 5-FU were dose reduced per study guidelines.  12. History of abdominal cramping. ? Related to irinotecan, 5-FU, or potentially the colon tumor. No recent abdominal cramping.  13.  Thrombocytopenia secondary to chemotherapy (while being treated with irinotecan and Avastin). Treatment was held for thrombocytopenia per study guidelines. The thrombocytopenia may be related to Avastin, or chronic liver disease from chemotherapy. The CT on 04/23/2012 confirmed mild splenomegaly. The CT on 06/04/2012 again showed splenomegaly. The thrombocytopenia persisted despite being off of irinotecan and Avastin for several months. The thrombocytopenia improved. She developed thrombocytopenia following cycle 1 and cycle 2 CAPOX. She again has thrombocytopenia.  14. Acne-like rash on the face. Question related to steroids. Resolved.  72. Arthralgias-most likely unrelated to the colon cancer diagnosis and chemotherapy. She has been evaluated by orthopedics. The etiology of the arthralgias is unclear.  16. Persistent discomfort at the right shoulder. Negative CT of the right upper extremity on 07/19/2012. MRI on 08/06/2012 with a partial thickness bursal surface tear of the supraspinatus tendon, moderate rotator cuff tendinopathy/tendinosis. She is now followed by orthopedics. The pain has improved. The range of motion at the right shoulder has improved  17.? Swelling at the right upper extremity and chest wall April 2014-negative right upper extremity Doppler.  18. Delayed nausea following cycle 1 CAPOX. Emend and Aloxi were added with cycle 2.  19. Mucositis and hand-foot syndrome secondary to Xeloda. The Xeloda was dose reduced to 1000 mg twice daily for 14 days with cycle 2.  20. Thrombocytopenia following cycle 1 CAPOX. Cycle 2 and cycle 3  were delayed due to thrombocytopenia. The oxaliplatin was further dose reduced with cycle 3.  21.New onset pruritus 04/04/2013-hyperbilirubinemia and an elevated alkaline phosphatase noted on labs 04/06/2013, the CT 03/31/2013 revealed peripheral biliary ductal dilatation in the medial segment of the left hepatic lobe,no options for intervention after evaluation by  Dr. Watt Climes. The hyperbilirubinemia initially resolved and was mildly elevated on 07/26/2013.  22. Acute severe upper abdominal pain, pleuritic, with radiation to the chest/shoulders beginning 07/25/2013-etiology unclear. Chest CT 07/25/2013 showed no evidence of an acute pulmonary embolus and no acute or metastatic findings in the chest. CT abdomen/pelvis on 07/26/2013 showed interval slight progression of metastatic disease to the liver; no new hepatic metastases; slight progression of intrahepatic biliary ductal dilatation; interval marked increase in the size of the spleen; interval increase in the size of a portacaval lymph node and gastrohepatic ligament lymph nodes; contracted thickwalled gallbladder possibly containing sludge. The pain is likely related to metastatic disease involving the liver with possible irritation of the diaphragm. The pain has resolved.  23. Status post biopsy of a liver lesion 08/10/2013. Pathology confirmed metastatic adenocarcinoma. Extended K-ras testing returned negative for the K-ras mutation.  24. Constipation.  25. Panitumumab skin rash. She continues minocycline and a moisturizer. The rash has progressed. She will begin fluticasone 0.05% twice daily to the affected areas as needed. 26. Insomnia, anxiety and restlessness following abrupt discontinuation of Percocet. Resolved after a slow Percocet taper.  27. Mucositis secondary chemotherapy  28. Acute nausea during the irinotecan infusion, lasting for approximately 24 hours. Improved with addition of Ativan to the premedication regimen.  29. Erythema over Port-A-Cath site following application of EMLA cream 11/01/2013-resolved   Dispositon-plan to continue irinotecan/Panitumumab on a 2 week schedule. We will see her in 4 weeks. She will contact the office in the interim with any problems.  Plan reviewed with Dr. Benay Spice.   Ned Card ANP/GNP-BC

## 2014-01-24 NOTE — Progress Notes (Signed)
Plt 76 today. OK to treat per Dr. Benay Spice

## 2014-01-24 NOTE — Patient Instructions (Signed)
[facility] Discharge Instructions for Patients Receiving Chemotherapy  Today you received the following chemotherapy agents: Vectibix, Irinotecan  To help prevent nausea and vomiting after your treatment, we encourage you to take your nausea medication as prescribed.    If you develop nausea and vomiting that is not controlled by your nausea medication, call the clinic.   BELOW ARE SYMPTOMS THAT SHOULD BE REPORTED IMMEDIATELY:  *FEVER GREATER THAN 100.5 F  *CHILLS WITH OR WITHOUT FEVER  NAUSEA AND VOMITING THAT IS NOT CONTROLLED WITH YOUR NAUSEA MEDICATION  *UNUSUAL SHORTNESS OF BREATH  *UNUSUAL BRUISING OR BLEEDING  TENDERNESS IN MOUTH AND THROAT WITH OR WITHOUT PRESENCE OF ULCERS  *URINARY PROBLEMS  *BOWEL PROBLEMS  UNUSUAL RASH Items with * indicate a potential emergency and should be followed up as soon as possible.  Feel free to call the clinic you have any questions or concerns. The clinic phone number is [phone].

## 2014-01-25 LAB — CEA: CEA: 8.7 ng/mL — ABNORMAL HIGH (ref 0.0–5.0)

## 2014-02-05 ENCOUNTER — Other Ambulatory Visit: Payer: Self-pay | Admitting: Oncology

## 2014-02-07 ENCOUNTER — Ambulatory Visit (HOSPITAL_BASED_OUTPATIENT_CLINIC_OR_DEPARTMENT_OTHER): Payer: BC Managed Care – PPO

## 2014-02-07 ENCOUNTER — Other Ambulatory Visit: Payer: Self-pay

## 2014-02-07 ENCOUNTER — Other Ambulatory Visit (HOSPITAL_BASED_OUTPATIENT_CLINIC_OR_DEPARTMENT_OTHER): Payer: BC Managed Care – PPO

## 2014-02-07 ENCOUNTER — Other Ambulatory Visit: Payer: BC Managed Care – PPO

## 2014-02-07 VITALS — BP 135/72 | HR 88 | Temp 97.4°F | Resp 18

## 2014-02-07 DIAGNOSIS — C189 Malignant neoplasm of colon, unspecified: Secondary | ICD-10-CM

## 2014-02-07 DIAGNOSIS — C184 Malignant neoplasm of transverse colon: Secondary | ICD-10-CM

## 2014-02-07 DIAGNOSIS — Z5112 Encounter for antineoplastic immunotherapy: Secondary | ICD-10-CM

## 2014-02-07 DIAGNOSIS — C787 Secondary malignant neoplasm of liver and intrahepatic bile duct: Secondary | ICD-10-CM

## 2014-02-07 DIAGNOSIS — R790 Abnormal level of blood mineral: Secondary | ICD-10-CM

## 2014-02-07 DIAGNOSIS — Z5111 Encounter for antineoplastic chemotherapy: Secondary | ICD-10-CM

## 2014-02-07 LAB — COMPREHENSIVE METABOLIC PANEL (CC13)
ALT: 27 U/L (ref 0–55)
ANION GAP: 7 meq/L (ref 3–11)
AST: 34 U/L (ref 5–34)
Albumin: 3.5 g/dL (ref 3.5–5.0)
Alkaline Phosphatase: 346 U/L — ABNORMAL HIGH (ref 40–150)
BUN: 11.9 mg/dL (ref 7.0–26.0)
CALCIUM: 9.2 mg/dL (ref 8.4–10.4)
CHLORIDE: 110 meq/L — AB (ref 98–109)
CO2: 28 meq/L (ref 22–29)
CREATININE: 0.6 mg/dL (ref 0.6–1.1)
Glucose: 98 mg/dl (ref 70–140)
Potassium: 4.2 mEq/L (ref 3.5–5.1)
Sodium: 145 mEq/L (ref 136–145)
Total Bilirubin: 0.57 mg/dL (ref 0.20–1.20)
Total Protein: 6.5 g/dL (ref 6.4–8.3)

## 2014-02-07 LAB — CBC WITH DIFFERENTIAL/PLATELET
BASO%: 0.7 % (ref 0.0–2.0)
BASOS ABS: 0 10*3/uL (ref 0.0–0.1)
EOS%: 5.5 % (ref 0.0–7.0)
Eosinophils Absolute: 0.1 10*3/uL (ref 0.0–0.5)
HEMATOCRIT: 35.8 % (ref 34.8–46.6)
HEMOGLOBIN: 11.4 g/dL — AB (ref 11.6–15.9)
LYMPH#: 0.3 10*3/uL — AB (ref 0.9–3.3)
LYMPH%: 14.2 % (ref 14.0–49.7)
MCH: 26.6 pg (ref 25.1–34.0)
MCHC: 32 g/dL (ref 31.5–36.0)
MCV: 83.1 fL (ref 79.5–101.0)
MONO#: 0.2 10*3/uL (ref 0.1–0.9)
MONO%: 9.9 % (ref 0.0–14.0)
NEUT#: 1.6 10*3/uL (ref 1.5–6.5)
NEUT%: 69.7 % (ref 38.4–76.8)
PLATELETS: 74 10*3/uL — AB (ref 145–400)
RBC: 4.3 10*6/uL (ref 3.70–5.45)
RDW: 18.3 % — ABNORMAL HIGH (ref 11.2–14.5)
WBC: 2.3 10*3/uL — ABNORMAL LOW (ref 3.9–10.3)

## 2014-02-07 LAB — MAGNESIUM (CC13): MAGNESIUM: 1.4 mg/dL — AB (ref 1.5–2.5)

## 2014-02-07 MED ORDER — DEXAMETHASONE SODIUM PHOSPHATE 10 MG/ML IJ SOLN
INTRAMUSCULAR | Status: AC
  Filled 2014-02-07: qty 1

## 2014-02-07 MED ORDER — IRINOTECAN HCL CHEMO INJECTION 100 MG/5ML
99.0000 mg/m2 | Freq: Once | INTRAVENOUS | Status: AC
  Administered 2014-02-07: 180 mg via INTRAVENOUS
  Filled 2014-02-07: qty 9

## 2014-02-07 MED ORDER — SODIUM CHLORIDE 0.9 % IV SOLN
Freq: Once | INTRAVENOUS | Status: AC
  Administered 2014-02-07: 13:00:00 via INTRAVENOUS

## 2014-02-07 MED ORDER — DEXAMETHASONE SODIUM PHOSPHATE 10 MG/ML IJ SOLN
10.0000 mg | Freq: Once | INTRAMUSCULAR | Status: AC
  Administered 2014-02-07: 10 mg via INTRAVENOUS

## 2014-02-07 MED ORDER — PALONOSETRON HCL INJECTION 0.25 MG/5ML
INTRAVENOUS | Status: AC
  Filled 2014-02-07: qty 5

## 2014-02-07 MED ORDER — SODIUM CHLORIDE 0.9 % IV SOLN
6.0000 mg/kg | Freq: Once | INTRAVENOUS | Status: AC
  Administered 2014-02-07: 420 mg via INTRAVENOUS
  Filled 2014-02-07: qty 21

## 2014-02-07 MED ORDER — PALONOSETRON HCL INJECTION 0.25 MG/5ML
0.2500 mg | Freq: Once | INTRAVENOUS | Status: AC
  Administered 2014-02-07: 0.25 mg via INTRAVENOUS

## 2014-02-07 MED ORDER — HEPARIN SOD (PORK) LOCK FLUSH 100 UNIT/ML IV SOLN
500.0000 [IU] | Freq: Once | INTRAVENOUS | Status: AC | PRN
  Administered 2014-02-07: 500 [IU]
  Filled 2014-02-07: qty 5

## 2014-02-07 MED ORDER — SODIUM CHLORIDE 0.9 % IJ SOLN
10.0000 mL | INTRAMUSCULAR | Status: DC | PRN
  Administered 2014-02-07: 10 mL
  Filled 2014-02-07: qty 10

## 2014-02-07 MED ORDER — LORAZEPAM 2 MG/ML IJ SOLN
0.5000 mg | Freq: Once | INTRAMUSCULAR | Status: AC
  Administered 2014-02-07: 0.5 mg via INTRAVENOUS

## 2014-02-07 MED ORDER — LORAZEPAM 2 MG/ML IJ SOLN
INTRAMUSCULAR | Status: AC
  Filled 2014-02-07: qty 1

## 2014-02-07 NOTE — Patient Instructions (Addendum)
[facility] Discharge Instructions for Patients Receiving Chemotherapy  Today you received the following chemotherapy agents Irinotecan,Vectibix  To help prevent nausea and vomiting after your treatment, we encourage you to take your nausea medication asprescribed.   If you develop nausea and vomiting that is not controlled by your nausea medication, call the clinic.   BELOW ARE SYMPTOMS THAT SHOULD BE REPORTED IMMEDIATELY:  *FEVER GREATER THAN 100.5 F  *CHILLS WITH OR WITHOUT FEVER  NAUSEA AND VOMITING THAT IS NOT CONTROLLED WITH YOUR NAUSEA MEDICATION  *UNUSUAL SHORTNESS OF BREATH  *UNUSUAL BRUISING OR BLEEDING  TENDERNESS IN MOUTH AND THROAT WITH OR WITHOUT PRESENCE OF ULCERS  *URINARY PROBLEMS  *BOWEL PROBLEMS  UNUSUAL RASH Items with * indicate a potential emergency and should be followed up as soon as possible.  Feel free to call the clinic you have any questions or concerns. The clinic phone number is [phone].

## 2014-02-08 LAB — CEA: CEA: 12 ng/mL — ABNORMAL HIGH (ref 0.0–5.0)

## 2014-02-09 ENCOUNTER — Telehealth: Payer: Self-pay | Admitting: *Deleted

## 2014-02-09 NOTE — Telephone Encounter (Signed)
Received message from pt asking "My CEA level went up by 3 points 8.7 to 12; is this something to be concerned about?"  Note to Dr. Benay Spice.  Next appt 02/21/14.

## 2014-02-09 NOTE — Telephone Encounter (Signed)
Per Dr Benay Spice, the CEA level increasing from 8.7 to 12 is not something to be concerned about and he will discuss further at his f/u with her on 02/21/14.  Informed pt, she verbalized understanding.  SLJ

## 2014-02-19 ENCOUNTER — Other Ambulatory Visit: Payer: Self-pay | Admitting: Oncology

## 2014-02-21 ENCOUNTER — Ambulatory Visit (HOSPITAL_BASED_OUTPATIENT_CLINIC_OR_DEPARTMENT_OTHER): Payer: BC Managed Care – PPO

## 2014-02-21 ENCOUNTER — Telehealth: Payer: Self-pay | Admitting: Oncology

## 2014-02-21 ENCOUNTER — Other Ambulatory Visit (HOSPITAL_BASED_OUTPATIENT_CLINIC_OR_DEPARTMENT_OTHER): Payer: BC Managed Care – PPO

## 2014-02-21 ENCOUNTER — Encounter: Payer: Self-pay | Admitting: Oncology

## 2014-02-21 ENCOUNTER — Ambulatory Visit (HOSPITAL_BASED_OUTPATIENT_CLINIC_OR_DEPARTMENT_OTHER): Payer: BC Managed Care – PPO | Admitting: Oncology

## 2014-02-21 VITALS — BP 120/73 | HR 74 | Temp 97.4°F | Resp 20 | Ht 67.0 in | Wt 147.8 lb

## 2014-02-21 DIAGNOSIS — C184 Malignant neoplasm of transverse colon: Secondary | ICD-10-CM

## 2014-02-21 DIAGNOSIS — Z5112 Encounter for antineoplastic immunotherapy: Secondary | ICD-10-CM

## 2014-02-21 DIAGNOSIS — R21 Rash and other nonspecific skin eruption: Secondary | ICD-10-CM

## 2014-02-21 DIAGNOSIS — C189 Malignant neoplasm of colon, unspecified: Secondary | ICD-10-CM

## 2014-02-21 DIAGNOSIS — C787 Secondary malignant neoplasm of liver and intrahepatic bile duct: Secondary | ICD-10-CM

## 2014-02-21 DIAGNOSIS — Z5111 Encounter for antineoplastic chemotherapy: Secondary | ICD-10-CM

## 2014-02-21 DIAGNOSIS — D696 Thrombocytopenia, unspecified: Secondary | ICD-10-CM

## 2014-02-21 LAB — CBC WITH DIFFERENTIAL/PLATELET
BASO%: 0.5 % (ref 0.0–2.0)
BASOS ABS: 0 10*3/uL (ref 0.0–0.1)
EOS%: 4.6 % (ref 0.0–7.0)
Eosinophils Absolute: 0.1 10*3/uL (ref 0.0–0.5)
HEMATOCRIT: 35.4 % (ref 34.8–46.6)
HEMOGLOBIN: 11.4 g/dL — AB (ref 11.6–15.9)
LYMPH#: 0.3 10*3/uL — AB (ref 0.9–3.3)
LYMPH%: 15.3 % (ref 14.0–49.7)
MCH: 26.8 pg (ref 25.1–34.0)
MCHC: 32.2 g/dL (ref 31.5–36.0)
MCV: 83.3 fL (ref 79.5–101.0)
MONO#: 0.3 10*3/uL (ref 0.1–0.9)
MONO%: 12.5 % (ref 0.0–14.0)
NEUT%: 67.1 % (ref 38.4–76.8)
NEUTROS ABS: 1.5 10*3/uL (ref 1.5–6.5)
Platelets: 63 10*3/uL — ABNORMAL LOW (ref 145–400)
RBC: 4.25 10*6/uL (ref 3.70–5.45)
RDW: 17.3 % — AB (ref 11.2–14.5)
WBC: 2.2 10*3/uL — AB (ref 3.9–10.3)
nRBC: 0 % (ref 0–0)

## 2014-02-21 LAB — COMPREHENSIVE METABOLIC PANEL (CC13)
ALBUMIN: 3.6 g/dL (ref 3.5–5.0)
ALT: 23 U/L (ref 0–55)
AST: 32 U/L (ref 5–34)
Alkaline Phosphatase: 350 U/L — ABNORMAL HIGH (ref 40–150)
Anion Gap: 9 mEq/L (ref 3–11)
BUN: 13.6 mg/dL (ref 7.0–26.0)
CALCIUM: 9.6 mg/dL (ref 8.4–10.4)
CHLORIDE: 109 meq/L (ref 98–109)
CO2: 27 mEq/L (ref 22–29)
Creatinine: 0.7 mg/dL (ref 0.6–1.1)
Glucose: 103 mg/dl (ref 70–140)
POTASSIUM: 4.4 meq/L (ref 3.5–5.1)
SODIUM: 145 meq/L (ref 136–145)
Total Bilirubin: 0.59 mg/dL (ref 0.20–1.20)
Total Protein: 6.7 g/dL (ref 6.4–8.3)

## 2014-02-21 LAB — CEA: CEA: 12.8 ng/mL — AB (ref 0.0–5.0)

## 2014-02-21 LAB — MAGNESIUM (CC13): MAGNESIUM: 1.5 mg/dL (ref 1.5–2.5)

## 2014-02-21 MED ORDER — DEXAMETHASONE SODIUM PHOSPHATE 10 MG/ML IJ SOLN
INTRAMUSCULAR | Status: AC
  Filled 2014-02-21: qty 1

## 2014-02-21 MED ORDER — HEPARIN SOD (PORK) LOCK FLUSH 100 UNIT/ML IV SOLN
500.0000 [IU] | Freq: Once | INTRAVENOUS | Status: AC | PRN
  Administered 2014-02-21: 500 [IU]
  Filled 2014-02-21: qty 5

## 2014-02-21 MED ORDER — LORAZEPAM 2 MG/ML IJ SOLN
INTRAMUSCULAR | Status: AC
  Filled 2014-02-21: qty 1

## 2014-02-21 MED ORDER — PALONOSETRON HCL INJECTION 0.25 MG/5ML
0.2500 mg | Freq: Once | INTRAVENOUS | Status: AC
  Administered 2014-02-21: 0.25 mg via INTRAVENOUS

## 2014-02-21 MED ORDER — LORAZEPAM 2 MG/ML IJ SOLN
0.5000 mg | Freq: Once | INTRAMUSCULAR | Status: AC
  Administered 2014-02-21: 0.5 mg via INTRAVENOUS

## 2014-02-21 MED ORDER — SODIUM CHLORIDE 0.9 % IJ SOLN
10.0000 mL | INTRAMUSCULAR | Status: DC | PRN
  Administered 2014-02-21: 10 mL
  Filled 2014-02-21: qty 10

## 2014-02-21 MED ORDER — DEXAMETHASONE SODIUM PHOSPHATE 10 MG/ML IJ SOLN
10.0000 mg | Freq: Once | INTRAMUSCULAR | Status: AC
  Administered 2014-02-21: 10 mg via INTRAVENOUS

## 2014-02-21 MED ORDER — PALONOSETRON HCL INJECTION 0.25 MG/5ML
INTRAVENOUS | Status: AC
  Filled 2014-02-21: qty 5

## 2014-02-21 MED ORDER — IRINOTECAN HCL CHEMO INJECTION 100 MG/5ML
100.0000 mg/m2 | Freq: Once | INTRAVENOUS | Status: AC
  Administered 2014-02-21: 182 mg via INTRAVENOUS
  Filled 2014-02-21: qty 9.1

## 2014-02-21 MED ORDER — ADAPALENE-BENZOYL PEROXIDE 0.1-2.5 % EX GEL
1.0000 | Freq: Every day | CUTANEOUS | Status: AC
Start: 2014-02-21 — End: ?

## 2014-02-21 MED ORDER — SODIUM CHLORIDE 0.9 % IV SOLN
Freq: Once | INTRAVENOUS | Status: AC
  Administered 2014-02-21: 11:00:00 via INTRAVENOUS

## 2014-02-21 MED ORDER — SODIUM CHLORIDE 0.9 % IV SOLN
6.0000 mg/kg | Freq: Once | INTRAVENOUS | Status: AC
  Administered 2014-02-21: 420 mg via INTRAVENOUS
  Filled 2014-02-21: qty 21

## 2014-02-21 NOTE — Telephone Encounter (Signed)
[facility] PRINTED APPT SCHED AND AVS FOR PT FOR mAY.....SED ADDED TX.

## 2014-02-21 NOTE — Progress Notes (Signed)
[employer] approved epiduo gel from 02/21/14-02/22/15

## 2014-02-21 NOTE — Progress Notes (Signed)
[facility] OFFICE PROGRESS NOTE   Diagnosis: Colon cancer  INTERVAL HISTORY:   She completed another treatment with irinotecan and panitumumab 02/07/2014. She has nausea during the chemotherapy infusion and for several days at home. The rash has progressed over the face. This is despite a steroid cream and minocycline. No abdominal pain. No diarrhea. She feels well. She is working.  Objective:  Vital signs in last 24 hours:  Blood pressure 120/73, pulse 74, temperature 97.4 F (36.3 C), temperature source Oral, resp. rate 20, height $RemoveBe'5\' 7"'ekuGHcGrD$  (1.702 m), weight 147 lb 12.8 oz (67.042 kg).    HEENT: No thrush, small ulcer at the left buccal mucosa Resp: Lungs clear bilaterally Cardio: Regular rate and rhythm GI: No hepatomegaly, nontender Vascular: No leg edema  Skin: Acne type rash over the face and trunk   Portacath/PICC-without erythema  Lab Results:  Lab Results  Component Value Date   WBC 2.2* 02/21/2014   HGB 11.4* 02/21/2014   HCT 35.4 02/21/2014   MCV 83.3 02/21/2014   PLT 63* 02/21/2014   NEUTROABS 1.5 02/21/2014    Lab Results  Component Value Date   NA 145 02/21/2014    Lab Results  Component Value Date   CEA 12.8* 02/21/2014    Imaging:  No results found.  Medications: I have reviewed the patient's current medications.  Assessment/Plan: 1.Metastatic colon cancer with multiple liver metastases noted on an MRI of the abdomen 12/02/2010 and on a CT 12/13/2010. A transverse colon mass was noted on the CT from 02/11/2011 and confirmed on a colonoscopy 12/17/2010. She began treatment with FOLFIRI/Avastin 12/31/2010. A restaging CT 06/20/2011 confirmed further improvement in the metastatic liver lesions and no evidence for progressive metastatic disease. A restaging CT 08/01/2011 showed stable liver lesions and no new lesions. Restaging CT 09/12/2011 showed stable liver lesions and no new lesions. A restaging CT 10/27/2011 showed stable liver lesions  and no new lesions. Restaging CT 12/05/2011 showed mild decrease in size of hepatic metastases and no evidence of disease progression. Restaging CT 01/26/2012 showed stable hepatic metastases and no new lesions. Restaging CT may 5/8/ 2013 revealed a decreased size of hepatic metastases and no new lesions. Restaging CT 04/23/2012 revealed no change in the measurable "target" lesions, a slight increase in the size of other lesions, and no new lesions. Restaging CT 06/04/2012 showed multiple hepatic metastases were grossly unchanged. Restaging CT 07/19/2012 with stable disease in the liver. Infusional 5-fluorouracil continued on a 2 week schedule. Treatment was held after the 08/03/2012 cycle due to thrombocytopenia. Restaging CT evaluation 08/30/2012 with mild increase in size of large hepatic metastases. Treatment was resumed with 5 fluorouracil and Avastin on 08/31/2012. Restaging CT 10/14/2012-stable hepatic metastases. Treatment continued with 5 fluorouracil and Avastin. Restaging CT 12/01/2012 revealed mild progression in the size of multiple hepatic lesions. Treatment was placed on hold. CEA 32.1 on 01/06/2013 and 71.9 on 03/11/2013. Restaging CT abdomen/pelvis 03/31/2013 with interval progression of liver metastases. She began cycle 1 CAPOX on 04/15/2013. The Xeloda was prematurely discontinued on 04/25/2013 due to redness and pain over the feet. She completed cycle 2 beginning 05/24/2013 and cycle 3 beginning on 07/05/2013. Disease progression presenting with severe abdominal pain. She began irinotecan/Panitumumab 09/05/2013. CEA markedly improved at 5.0 on 11/15/2013.  Restaging CT 11/25/2013 with improvement in hepatic metastases and abdominal lymphadenopathy.  Irinotecan/Panitumumab continued. 2. Abdominal pain-? Secondary to liver metastases, metastatic adenopathy, or the primary colon tumor. Less likely peptic ulcer disease. Improved.  3. Anorexia, early satiety,  and weight loss secondary to  metastatic colon cancer-resolved.  4. Microcytic anemia. Resolved.  5. History of migraine headaches.  6. Status post uterine fibroid surgery.  7. Port-A-Cath placement 12/20/2010.  8. Delayed nausea following cycle 1 of FOLFIRI/Avastin. Improved with the addition of Aloxi and prophylactic Decadron beginning with cycle #2.  9. Neutropenia secondary to chemotherapy. Cycle #4 was held on 02/11/2011. She subsequently received Neulasta support.  10. History of intermittent fever with no apparent source for infection, likely "tumor fever" or fever related to chemotherapy. No recent fever.  11. History of mucositis secondary to 5-FU, initially grade 2. Irinotecan and 5-FU were dose reduced per study guidelines.  12. History of abdominal cramping. ? Related to irinotecan, 5-FU, or potentially the colon tumor. No recent abdominal cramping.  13. Thrombocytopenia secondary to chemotherapy (while being treated with irinotecan and Avastin). Treatment was held for thrombocytopenia per study guidelines. The thrombocytopenia may be related to Avastin, or chronic liver disease from chemotherapy. The CT on 04/23/2012 confirmed mild splenomegaly. The CT on 06/04/2012 again showed splenomegaly. The thrombocytopenia persisted despite being off of irinotecan and Avastin for several months. The thrombocytopenia improved. She developed thrombocytopenia following cycle 1 and cycle 2 CAPOX. She again has thrombocytopenia.  60. Arthralgias-most likely unrelated to the colon cancer diagnosis and chemotherapy. She has been evaluated by orthopedics. The etiology of the arthralgias is unclear.  16. Persistent discomfort at the right shoulder. Negative CT of the right upper extremity on 07/19/2012. MRI on 08/06/2012 with a partial thickness bursal surface tear of the supraspinatus tendon, moderate rotator cuff tendinopathy/tendinosis. She is now followed by orthopedics. The pain has improved. The range of motion at the right shoulder  has improved  17.? Swelling at the right upper extremity and chest wall April 2014-negative right upper extremity Doppler.  18. Delayed nausea following cycle 1 CAPOX. Emend and Aloxi were added with cycle 2.  19. Mucositis and hand-foot syndrome secondary to Xeloda. The Xeloda was dose reduced to 1000 mg twice daily for 14 days with cycle 2.  20. Thrombocytopenia following cycle 1 CAPOX. Cycle 2 and cycle 3 were delayed due to thrombocytopenia. The oxaliplatin was further dose reduced with cycle 3.  21.New onset pruritus 04/04/2013-hyperbilirubinemia and an elevated alkaline phosphatase noted on labs 04/06/2013, the CT 03/31/2013 revealed peripheral biliary ductal dilatation in the medial segment of the left hepatic lobe,no options for intervention after evaluation by Dr. Watt Climes. The hyperbilirubinemia initially resolved and was mildly elevated on 07/26/2013.  22. Acute severe upper abdominal pain, pleuritic, with radiation to the chest/shoulders beginning 07/25/2013-etiology unclear. Chest CT 07/25/2013 showed no evidence of an acute pulmonary embolus and no acute or metastatic findings in the chest. CT abdomen/pelvis on 07/26/2013 showed interval slight progression of metastatic disease to the liver; no new hepatic metastases; slight progression of intrahepatic biliary ductal dilatation; interval marked increase in the size of the spleen; interval increase in the size of a portacaval lymph node and gastrohepatic ligament lymph nodes; contracted thickwalled gallbladder possibly containing sludge. The pain is likely related to metastatic disease involving the liver with possible irritation of the diaphragm. The pain has resolved.  23. Status post biopsy of a liver lesion 08/10/2013. Pathology confirmed metastatic adenocarcinoma. Extended K-ras testing returned negative for the K-ras mutation.  24. Constipation.  25. Panitumumab skin rash. She continues minocycline and a moisturizer. The rash has progressed  despite a steroid cream  26. Insomnia, anxiety and restlessness following abrupt discontinuation of Percocet. Resolved after a slow Percocet  taper.  27. Mucositis secondary chemotherapy  28. Acute nausea during the irinotecan infusion, lasting for approximately 24 hours. Improved with addition of Ativan to the premedication regimen.  29. Erythema over Port-A-Cath site following application of EMLA cream 11/01/2013-resolved    Disposition:  Her overall status appears unchanged. The CEA was slightly higher on 02/07/2014. We checked a CEA today.  The panitumumab skin rash has progressed over the face. She will continue minocycline and we will try a different topical preparation.  Ms. Natalie Mccarthy will return for an office and lab visit in 2 weeks. The plan is to obtain a restaging CT at a 4 month interval.  Ladell Pier, MD  02/21/2014  2:02 PM

## 2014-02-21 NOTE — Patient Instructions (Signed)
[facility] Discharge Instructions for Patients Receiving Chemotherapy  Today you received the following chemotherapy agents Vectibix/Irinotecan.  To help prevent nausea and vomiting after your treatment, we encourage you to take your nausea medication as prescribed.   If you develop nausea and vomiting that is not controlled by your nausea medication, call the clinic.   BELOW ARE SYMPTOMS THAT SHOULD BE REPORTED IMMEDIATELY:  *FEVER GREATER THAN 100.5 F  *CHILLS WITH OR WITHOUT FEVER  NAUSEA AND VOMITING THAT IS NOT CONTROLLED WITH YOUR NAUSEA MEDICATION  *UNUSUAL SHORTNESS OF BREATH  *UNUSUAL BRUISING OR BLEEDING  TENDERNESS IN MOUTH AND THROAT WITH OR WITHOUT PRESENCE OF ULCERS  *URINARY PROBLEMS  *BOWEL PROBLEMS  UNUSUAL RASH Items with * indicate a potential emergency and should be followed up as soon as possible.  Feel free to call the clinic you have any questions or concerns. The clinic phone number is [phone].    

## 2014-02-21 NOTE — Progress Notes (Signed)
Treat with chemo despite PLT 63, per Dr. Benay Spice.

## 2014-02-28 ENCOUNTER — Telehealth: Payer: Self-pay | Admitting: *Deleted

## 2014-02-28 NOTE — Telephone Encounter (Signed)
Call from pt asking if OK to proceed with routine dental cleaning as scheduled 4/29? CBC reviewed with Dr. Benay Spice: OK for dental cleaning. Pt made aware. Faxed copy of CBC to Dr. Orlando Penner.

## 2014-03-01 ENCOUNTER — Telehealth: Payer: Self-pay | Admitting: *Deleted

## 2014-03-01 ENCOUNTER — Other Ambulatory Visit: Payer: Self-pay | Admitting: *Deleted

## 2014-03-01 DIAGNOSIS — C189 Malignant neoplasm of colon, unspecified: Secondary | ICD-10-CM

## 2014-03-01 MED ORDER — MAGIC MOUTHWASH: 5.0000 mL | ORAL | Status: DC | PRN

## 2014-03-01 NOTE — Telephone Encounter (Signed)
Received refill request for Magic Mouthwash  From [employer].  Spoke with pt and was informed that pt experiences some discomfort inside her mouth with the new chemo.  Pt denied white coating on tongue, denied white pus sores in mouth, just mild redness.  Pt stated she had not used this med in a long time.  Since pt has tenderness in mouth now, pt has resumed magic mouthwash.  Pt stated eating and drinking fluids fine.  Messaage to Dr. Benay Spice.

## 2014-03-05 ENCOUNTER — Other Ambulatory Visit: Payer: Self-pay | Admitting: Oncology

## 2014-03-07 ENCOUNTER — Ambulatory Visit (HOSPITAL_BASED_OUTPATIENT_CLINIC_OR_DEPARTMENT_OTHER): Payer: BC Managed Care – PPO

## 2014-03-07 ENCOUNTER — Ambulatory Visit (HOSPITAL_BASED_OUTPATIENT_CLINIC_OR_DEPARTMENT_OTHER): Payer: BC Managed Care – PPO | Admitting: Nurse Practitioner

## 2014-03-07 ENCOUNTER — Telehealth: Payer: Self-pay | Admitting: Oncology

## 2014-03-07 ENCOUNTER — Other Ambulatory Visit (HOSPITAL_BASED_OUTPATIENT_CLINIC_OR_DEPARTMENT_OTHER): Payer: BC Managed Care – PPO

## 2014-03-07 ENCOUNTER — Telehealth: Payer: Self-pay | Admitting: *Deleted

## 2014-03-07 VITALS — BP 120/61 | HR 78 | Temp 98.1°F | Resp 18 | Ht 67.0 in | Wt 149.7 lb

## 2014-03-07 DIAGNOSIS — C184 Malignant neoplasm of transverse colon: Secondary | ICD-10-CM

## 2014-03-07 DIAGNOSIS — G893 Neoplasm related pain (acute) (chronic): Secondary | ICD-10-CM

## 2014-03-07 DIAGNOSIS — C189 Malignant neoplasm of colon, unspecified: Secondary | ICD-10-CM

## 2014-03-07 DIAGNOSIS — Z5111 Encounter for antineoplastic chemotherapy: Secondary | ICD-10-CM

## 2014-03-07 DIAGNOSIS — Z5112 Encounter for antineoplastic immunotherapy: Secondary | ICD-10-CM

## 2014-03-07 DIAGNOSIS — C787 Secondary malignant neoplasm of liver and intrahepatic bile duct: Secondary | ICD-10-CM

## 2014-03-07 DIAGNOSIS — R110 Nausea: Secondary | ICD-10-CM

## 2014-03-07 LAB — COMPREHENSIVE METABOLIC PANEL (CC13)
ALT: 25 U/L (ref 0–55)
ANION GAP: 7 meq/L (ref 3–11)
AST: 33 U/L (ref 5–34)
Albumin: 3.3 g/dL — ABNORMAL LOW (ref 3.5–5.0)
Alkaline Phosphatase: 321 U/L — ABNORMAL HIGH (ref 40–150)
BILIRUBIN TOTAL: 0.51 mg/dL (ref 0.20–1.20)
BUN: 11.5 mg/dL (ref 7.0–26.0)
CO2: 28 mEq/L (ref 22–29)
CREATININE: 0.7 mg/dL (ref 0.6–1.1)
Calcium: 9.3 mg/dL (ref 8.4–10.4)
Chloride: 110 mEq/L — ABNORMAL HIGH (ref 98–109)
GLUCOSE: 91 mg/dL (ref 70–140)
Potassium: 4.5 mEq/L (ref 3.5–5.1)
Sodium: 145 mEq/L (ref 136–145)
Total Protein: 6.3 g/dL — ABNORMAL LOW (ref 6.4–8.3)

## 2014-03-07 LAB — CBC WITH DIFFERENTIAL/PLATELET
BASO%: 0.9 % (ref 0.0–2.0)
BASOS ABS: 0 10*3/uL (ref 0.0–0.1)
EOS ABS: 0.1 10*3/uL (ref 0.0–0.5)
EOS%: 6 % (ref 0.0–7.0)
HCT: 34.1 % — ABNORMAL LOW (ref 34.8–46.6)
HGB: 10.9 g/dL — ABNORMAL LOW (ref 11.6–15.9)
LYMPH%: 16.7 % (ref 14.0–49.7)
MCH: 26.8 pg (ref 25.1–34.0)
MCHC: 32 g/dL (ref 31.5–36.0)
MCV: 83.8 fL (ref 79.5–101.0)
MONO#: 0.3 10*3/uL (ref 0.1–0.9)
MONO%: 12.5 % (ref 0.0–14.0)
NEUT%: 63.9 % (ref 38.4–76.8)
NEUTROS ABS: 1.4 10*3/uL — AB (ref 1.5–6.5)
PLATELETS: 61 10*3/uL — AB (ref 145–400)
RBC: 4.07 10*6/uL (ref 3.70–5.45)
RDW: 17.5 % — ABNORMAL HIGH (ref 11.2–14.5)
WBC: 2.2 10*3/uL — ABNORMAL LOW (ref 3.9–10.3)
lymph#: 0.4 10*3/uL — ABNORMAL LOW (ref 0.9–3.3)
nRBC: 0 % (ref 0–0)

## 2014-03-07 LAB — MAGNESIUM (CC13): MAGNESIUM: 1.5 mg/dL (ref 1.5–2.5)

## 2014-03-07 MED ORDER — DEXAMETHASONE SODIUM PHOSPHATE 10 MG/ML IJ SOLN
10.0000 mg | Freq: Once | INTRAMUSCULAR | Status: AC
  Administered 2014-03-07: 10 mg via INTRAVENOUS

## 2014-03-07 MED ORDER — SODIUM CHLORIDE 0.9 % IV SOLN
Freq: Once | INTRAVENOUS | Status: AC
  Administered 2014-03-07: 12:00:00 via INTRAVENOUS

## 2014-03-07 MED ORDER — HEPARIN SOD (PORK) LOCK FLUSH 100 UNIT/ML IV SOLN
500.0000 [IU] | Freq: Once | INTRAVENOUS | Status: AC | PRN
  Administered 2014-03-07: 500 [IU]
  Filled 2014-03-07: qty 5

## 2014-03-07 MED ORDER — ATROPINE SULFATE 1 MG/ML IJ SOLN: 0.5000 mg | Freq: Once | INTRAMUSCULAR | Status: DC | PRN

## 2014-03-07 MED ORDER — MAGIC MOUTHWASH: ORAL | Status: AC

## 2014-03-07 MED ORDER — DEXAMETHASONE SODIUM PHOSPHATE 10 MG/ML IJ SOLN
INTRAMUSCULAR | Status: AC
  Filled 2014-03-07: qty 1

## 2014-03-07 MED ORDER — PALONOSETRON HCL INJECTION 0.25 MG/5ML
0.2500 mg | Freq: Once | INTRAVENOUS | Status: AC
  Administered 2014-03-07: 0.25 mg via INTRAVENOUS

## 2014-03-07 MED ORDER — DEXTROSE 5 % IV SOLN
100.0000 mg/m2 | Freq: Once | INTRAVENOUS | Status: AC
  Administered 2014-03-07: 182 mg via INTRAVENOUS
  Filled 2014-03-07: qty 9.1

## 2014-03-07 MED ORDER — LORAZEPAM 2 MG/ML IJ SOLN
INTRAMUSCULAR | Status: AC
  Filled 2014-03-07: qty 1

## 2014-03-07 MED ORDER — SODIUM CHLORIDE 0.9 % IJ SOLN
10.0000 mL | INTRAMUSCULAR | Status: DC | PRN
  Administered 2014-03-07: 10 mL
  Filled 2014-03-07: qty 10

## 2014-03-07 MED ORDER — SODIUM CHLORIDE 0.9 % IV SOLN
6.0000 mg/kg | Freq: Once | INTRAVENOUS | Status: AC
  Administered 2014-03-07: 420 mg via INTRAVENOUS
  Filled 2014-03-07: qty 21

## 2014-03-07 MED ORDER — LORAZEPAM 2 MG/ML IJ SOLN
0.5000 mg | Freq: Once | INTRAMUSCULAR | Status: AC
  Administered 2014-03-07: 0.5 mg via INTRAVENOUS

## 2014-03-07 NOTE — Progress Notes (Signed)
[facility] OFFICE PROGRESS NOTE   Diagnosis:  Metastatic colon cancer.  INTERVAL HISTORY:   Natalie Mccarthy returns as scheduled. She was last treated with irinotecan/Panitumumab on 02/21/2014. She has nausea during the chemotherapy infusion. She developed a few mouth sores. She has Magic mouthwash to use at home if needed. No diarrhea. The skin rash is better. She notes less pruritus. She denies abdominal pain. No bleeding  Objective:  Vital signs in last 24 hours:  Blood pressure 120/61, pulse 78, temperature 98.1 F (36.7 C), temperature source Oral, resp. rate 18, height _0  (1.702 m), weight 149 lb 11.2 oz (67.903 kg), SpO2 100.00%.    HEENT: No thrush or ulcerations. Resp: Lungs clear. Cardio: Regular cardiac rhythm. GI: Abdomen soft and nontender. No hepatomegaly. No mass. Vascular: No leg edema.  Skin: Acne-type rash over the face and neck.  Port-A-Cath site is without erythema.   Lab Results:  Lab Results  Component Value Date   WBC 2.2* 03/07/2014   HGB 10.9* 03/07/2014   HCT 34.1* 03/07/2014   MCV 83.8 03/07/2014   PLT 61* 03/07/2014   NEUTROABS 1.4* 03/07/2014    Imaging:  No results found.  Medications: I have reviewed the patient's current medications.  Assessment/Plan: 1.Metastatic colon cancer with multiple liver metastases noted on an MRI of the abdomen 12/02/2010 and on a CT 12/13/2010. A transverse colon mass was noted on the CT from 02/11/2011 and confirmed on a colonoscopy 12/17/2010. She began treatment with FOLFIRI/Avastin 12/31/2010. A restaging CT 06/20/2011 confirmed further improvement in the metastatic liver lesions and no evidence for progressive metastatic disease. A restaging CT 08/01/2011 showed stable liver lesions and no new lesions. Restaging CT 09/12/2011 showed stable liver lesions and no new lesions. A restaging CT 10/27/2011 showed stable liver lesions and no new lesions. Restaging CT 12/05/2011 showed mild decrease in size of  hepatic metastases and no evidence of disease progression. Restaging CT 01/26/2012 showed stable hepatic metastases and no new lesions. Restaging CT may 5/8/ 2013 revealed a decreased size of hepatic metastases and no new lesions. Restaging CT 04/23/2012 revealed no change in the measurable "target" lesions, a slight increase in the size of other lesions, and no new lesions. Restaging CT 06/04/2012 showed multiple hepatic metastases were grossly unchanged. Restaging CT 07/19/2012 with stable disease in the liver. Infusional 5-fluorouracil continued on a 2 week schedule. Treatment was held after the 08/03/2012 cycle due to thrombocytopenia. Restaging CT evaluation 08/30/2012 with mild increase in size of large hepatic metastases. Treatment was resumed with 5 fluorouracil and Avastin on 08/31/2012. Restaging CT 10/14/2012-stable hepatic metastases. Treatment continued with 5 fluorouracil and Avastin. Restaging CT 12/01/2012 revealed mild progression in the size of multiple hepatic lesions. Treatment was placed on hold. CEA 32.1 on 01/06/2013 and 71.9 on 03/11/2013. Restaging CT abdomen/pelvis 03/31/2013 with interval progression of liver metastases. She began cycle 1 CAPOX on 04/15/2013. The Xeloda was prematurely discontinued on 04/25/2013 due to redness and pain over the feet. She completed cycle 2 beginning 05/24/2013 and cycle 3 beginning on 07/05/2013. Disease progression presenting with severe abdominal pain. She began irinotecan/Panitumumab 09/05/2013. CEA markedly improved at 5.0 on 11/15/2013.  Restaging CT 11/25/2013 with improvement in hepatic metastases and abdominal lymphadenopathy.  Irinotecan/Panitumumab continued. 2. Abdominal pain-? Secondary to liver metastases, metastatic adenopathy, or the primary colon tumor. Less likely peptic ulcer disease. Improved.  3. Anorexia, early satiety, and weight loss secondary to metastatic colon cancer-resolved.  4. Microcytic anemia. Resolved.  5. History of  migraine  headaches.  6. Status post uterine fibroid surgery.  7. Port-A-Cath placement 12/20/2010.  8. Delayed nausea following cycle 1 of FOLFIRI/Avastin. Improved with the addition of Aloxi and prophylactic Decadron beginning with cycle #2.  9. Neutropenia secondary to chemotherapy. Cycle #4 was held on 02/11/2011. She subsequently received Neulasta support.  10. History of intermittent fever with no apparent source for infection, likely "tumor fever" or fever related to chemotherapy. No recent fever.  11. History of mucositis secondary to 5-FU, initially grade 2. Irinotecan and 5-FU were dose reduced per study guidelines.  12. History of abdominal cramping. ? Related to irinotecan, 5-FU, or potentially the colon tumor. No recent abdominal cramping.  13. Thrombocytopenia secondary to chemotherapy (while being treated with irinotecan and Avastin). Treatment was held for thrombocytopenia per study guidelines. The thrombocytopenia may be related to Avastin, or chronic liver disease from chemotherapy. The CT on 04/23/2012 confirmed mild splenomegaly. The CT on 06/04/2012 again showed splenomegaly. The thrombocytopenia persisted despite being off of irinotecan and Avastin for several months. The thrombocytopenia improved. She developed thrombocytopenia following cycle 1 and cycle 2 CAPOX. She again has thrombocytopenia.  32. Arthralgias-most likely unrelated to the colon cancer diagnosis and chemotherapy. She has been evaluated by orthopedics. The etiology of the arthralgias is unclear.  16. Persistent discomfort at the right shoulder. Negative CT of the right upper extremity on 07/19/2012. MRI on 08/06/2012 with a partial thickness bursal surface tear of the supraspinatus tendon, moderate rotator cuff tendinopathy/tendinosis. She is now followed by orthopedics. The pain has improved. The range of motion at the right shoulder has improved  17.? Swelling at the right upper extremity and chest wall April  2014-negative right upper extremity Doppler.  18. Delayed nausea following cycle 1 CAPOX. Emend and Aloxi were added with cycle 2.  19. Mucositis and hand-foot syndrome secondary to Xeloda. The Xeloda was dose reduced to 1000 mg twice daily for 14 days with cycle 2.  20. Thrombocytopenia following cycle 1 CAPOX. Cycle 2 and cycle 3 were delayed due to thrombocytopenia. The oxaliplatin was further dose reduced with cycle 3.  21.New onset pruritus 04/04/2013-hyperbilirubinemia and an elevated alkaline phosphatase noted on labs 04/06/2013, the CT 03/31/2013 revealed peripheral biliary ductal dilatation in the medial segment of the left hepatic lobe,no options for intervention after evaluation by Dr. Watt Climes. The hyperbilirubinemia initially resolved and was mildly elevated on 07/26/2013.  22. Acute severe upper abdominal pain, pleuritic, with radiation to the chest/shoulders beginning 07/25/2013-etiology unclear. Chest CT 07/25/2013 showed no evidence of an acute pulmonary embolus and no acute or metastatic findings in the chest. CT abdomen/pelvis on 07/26/2013 showed interval slight progression of metastatic disease to the liver; no new hepatic metastases; slight progression of intrahepatic biliary ductal dilatation; interval marked increase in the size of the spleen; interval increase in the size of a portacaval lymph node and gastrohepatic ligament lymph nodes; contracted thickwalled gallbladder possibly containing sludge. The pain is likely related to metastatic disease involving the liver with possible irritation of the diaphragm. The pain has resolved.  23. Status post biopsy of a liver lesion 08/10/2013. Pathology confirmed metastatic adenocarcinoma. Extended K-ras testing returned negative for the K-ras mutation.  24. Constipation.  25. Panitumumab skin rash. She continues minocycline and a moisturizer. She is using over-the-counter hydrocortisone cream. The rash is better. 26. Insomnia, anxiety and  restlessness following abrupt discontinuation of Percocet. Resolved after a slow Percocet taper.  27. Mucositis secondary to chemotherapy.  28. Acute nausea during the irinotecan infusion, lasting for approximately  24 hours. Improved with addition of Ativan to the premedication regimen.  29. Erythema over Port-A-Cath site following application of EMLA cream 11/01/2013-resolved     Disposition: She appears well. The CEA was overall stable on 02/21/2014. Plan to proceed with irinotecan/Panitumumab today as scheduled. She will return for a followup visit and treatment in 2 weeks. The plan is for a restaging CT evaluation at a four-month interval. She will contact the office prior to her next visit with any problems.  Plan reviewed with Dr. Benay Spice.    Owens Shark ANP/GNP-BC   03/07/2014  11:03 AM

## 2014-03-07 NOTE — Telephone Encounter (Signed)
Per staff message and POF I have scheduled appts.  JMW  

## 2014-03-07 NOTE — Patient Instructions (Signed)
[facility] Discharge Instructions for Patients Receiving Chemotherapy  Today you received the following chemotherapy agents Camptosar/Vectibix  To help prevent nausea and vomiting after your treatment, we encourage you to take your nausea medication as needed   If you develop nausea and vomiting that is not controlled by your nausea medication, call the clinic.   BELOW ARE SYMPTOMS THAT SHOULD BE REPORTED IMMEDIATELY:  *FEVER GREATER THAN 100.5 F  *CHILLS WITH OR WITHOUT FEVER  NAUSEA AND VOMITING THAT IS NOT CONTROLLED WITH YOUR NAUSEA MEDICATION  *UNUSUAL SHORTNESS OF BREATH  *UNUSUAL BRUISING OR BLEEDING  TENDERNESS IN MOUTH AND THROAT WITH OR WITHOUT PRESENCE OF ULCERS  *URINARY PROBLEMS  *BOWEL PROBLEMS  UNUSUAL RASH Items with * indicate a potential emergency and should be followed up as soon as possible.  Feel free to call the clinic you have any questions or concerns. The clinic phone number is [phone].

## 2014-03-07 NOTE — Telephone Encounter (Signed)
Gave pt appt for lab, md chemo for May and June 2015

## 2014-03-19 ENCOUNTER — Other Ambulatory Visit: Payer: Self-pay | Admitting: Oncology

## 2014-03-20 ENCOUNTER — Other Ambulatory Visit: Payer: Self-pay | Admitting: *Deleted

## 2014-03-21 ENCOUNTER — Other Ambulatory Visit (HOSPITAL_BASED_OUTPATIENT_CLINIC_OR_DEPARTMENT_OTHER): Payer: BC Managed Care – PPO

## 2014-03-21 ENCOUNTER — Ambulatory Visit (HOSPITAL_BASED_OUTPATIENT_CLINIC_OR_DEPARTMENT_OTHER): Payer: BC Managed Care – PPO

## 2014-03-21 ENCOUNTER — Telehealth: Payer: Self-pay | Admitting: Oncology

## 2014-03-21 ENCOUNTER — Other Ambulatory Visit: Payer: BC Managed Care – PPO

## 2014-03-21 ENCOUNTER — Other Ambulatory Visit: Payer: Self-pay

## 2014-03-21 ENCOUNTER — Ambulatory Visit (HOSPITAL_BASED_OUTPATIENT_CLINIC_OR_DEPARTMENT_OTHER): Payer: BC Managed Care – PPO | Admitting: Nurse Practitioner

## 2014-03-21 VITALS — BP 116/72 | HR 70 | Temp 96.9°F | Resp 19 | Ht 67.0 in | Wt 148.9 lb

## 2014-03-21 DIAGNOSIS — C184 Malignant neoplasm of transverse colon: Secondary | ICD-10-CM

## 2014-03-21 DIAGNOSIS — R21 Rash and other nonspecific skin eruption: Secondary | ICD-10-CM

## 2014-03-21 DIAGNOSIS — C787 Secondary malignant neoplasm of liver and intrahepatic bile duct: Secondary | ICD-10-CM

## 2014-03-21 DIAGNOSIS — D6959 Other secondary thrombocytopenia: Secondary | ICD-10-CM

## 2014-03-21 DIAGNOSIS — Z5111 Encounter for antineoplastic chemotherapy: Secondary | ICD-10-CM

## 2014-03-21 DIAGNOSIS — C189 Malignant neoplasm of colon, unspecified: Secondary | ICD-10-CM

## 2014-03-21 DIAGNOSIS — D702 Other drug-induced agranulocytosis: Secondary | ICD-10-CM

## 2014-03-21 DIAGNOSIS — Z5112 Encounter for antineoplastic immunotherapy: Secondary | ICD-10-CM

## 2014-03-21 DIAGNOSIS — K1231 Oral mucositis (ulcerative) due to antineoplastic therapy: Secondary | ICD-10-CM

## 2014-03-21 LAB — COMPREHENSIVE METABOLIC PANEL (CC13)
ALBUMIN: 3.5 g/dL (ref 3.5–5.0)
ALT: 26 U/L (ref 0–55)
ANION GAP: 9 meq/L (ref 3–11)
AST: 32 U/L (ref 5–34)
Alkaline Phosphatase: 319 U/L — ABNORMAL HIGH (ref 40–150)
BUN: 9.8 mg/dL (ref 7.0–26.0)
CHLORIDE: 108 meq/L (ref 98–109)
CO2: 27 meq/L (ref 22–29)
Calcium: 9.5 mg/dL (ref 8.4–10.4)
Creatinine: 0.7 mg/dL (ref 0.6–1.1)
GLUCOSE: 92 mg/dL (ref 70–140)
POTASSIUM: 4.5 meq/L (ref 3.5–5.1)
SODIUM: 144 meq/L (ref 136–145)
TOTAL PROTEIN: 6.4 g/dL (ref 6.4–8.3)
Total Bilirubin: 0.6 mg/dL (ref 0.20–1.20)

## 2014-03-21 LAB — CBC WITH DIFFERENTIAL/PLATELET
BASO%: 1 % (ref 0.0–2.0)
Basophils Absolute: 0 10*3/uL (ref 0.0–0.1)
EOS ABS: 0.1 10*3/uL (ref 0.0–0.5)
EOS%: 6.9 % (ref 0.0–7.0)
HCT: 34.6 % — ABNORMAL LOW (ref 34.8–46.6)
HGB: 11.1 g/dL — ABNORMAL LOW (ref 11.6–15.9)
LYMPH%: 15.8 % (ref 14.0–49.7)
MCH: 26.5 pg (ref 25.1–34.0)
MCHC: 31.9 g/dL (ref 31.5–36.0)
MCV: 83 fL (ref 79.5–101.0)
MONO#: 0.2 10*3/uL (ref 0.1–0.9)
MONO%: 10.8 % (ref 0.0–14.0)
NEUT%: 65.5 % (ref 38.4–76.8)
NEUTROS ABS: 1.4 10*3/uL — AB (ref 1.5–6.5)
PLATELETS: 75 10*3/uL — AB (ref 145–400)
RBC: 4.17 10*6/uL (ref 3.70–5.45)
RDW: 19.8 % — AB (ref 11.2–14.5)
WBC: 2.1 10*3/uL — AB (ref 3.9–10.3)
lymph#: 0.3 10*3/uL — ABNORMAL LOW (ref 0.9–3.3)

## 2014-03-21 MED ORDER — DEXAMETHASONE SODIUM PHOSPHATE 10 MG/ML IJ SOLN
INTRAMUSCULAR | Status: AC
  Filled 2014-03-21: qty 1

## 2014-03-21 MED ORDER — IRINOTECAN HCL CHEMO INJECTION 100 MG/5ML
100.0000 mg/m2 | Freq: Once | INTRAVENOUS | Status: AC
  Administered 2014-03-21: 182 mg via INTRAVENOUS
  Filled 2014-03-21: qty 9.1

## 2014-03-21 MED ORDER — PALONOSETRON HCL INJECTION 0.25 MG/5ML
0.2500 mg | Freq: Once | INTRAVENOUS | Status: AC
  Administered 2014-03-21: 0.25 mg via INTRAVENOUS

## 2014-03-21 MED ORDER — SODIUM CHLORIDE 0.9 % IV SOLN
Freq: Once | INTRAVENOUS | Status: AC
  Administered 2014-03-21: 13:00:00 via INTRAVENOUS

## 2014-03-21 MED ORDER — SODIUM CHLORIDE 0.9 % IV SOLN
6.0000 mg/kg | Freq: Once | INTRAVENOUS | Status: AC
  Administered 2014-03-21: 420 mg via INTRAVENOUS
  Filled 2014-03-21: qty 21

## 2014-03-21 MED ORDER — DEXAMETHASONE SODIUM PHOSPHATE 10 MG/ML IJ SOLN
10.0000 mg | Freq: Once | INTRAMUSCULAR | Status: AC
  Administered 2014-03-21: 10 mg via INTRAVENOUS

## 2014-03-21 MED ORDER — HEPARIN SOD (PORK) LOCK FLUSH 100 UNIT/ML IV SOLN
500.0000 [IU] | Freq: Once | INTRAVENOUS | Status: AC | PRN
  Administered 2014-03-21: 500 [IU]
  Filled 2014-03-21: qty 5

## 2014-03-21 MED ORDER — SODIUM CHLORIDE 0.9 % IJ SOLN
10.0000 mL | INTRAMUSCULAR | Status: DC | PRN
  Administered 2014-03-21: 10 mL
  Filled 2014-03-21: qty 10

## 2014-03-21 MED ORDER — LORAZEPAM 2 MG/ML IJ SOLN
0.5000 mg | Freq: Once | INTRAMUSCULAR | Status: AC
  Administered 2014-03-21: 0.5 mg via INTRAVENOUS

## 2014-03-21 MED ORDER — PALONOSETRON HCL INJECTION 0.25 MG/5ML
INTRAVENOUS | Status: AC
Start: 2014-03-21 — End: 2014-03-21
  Filled 2014-03-21: qty 5

## 2014-03-21 MED ORDER — ALPRAZOLAM 0.5 MG PO TABS: 0.5000 mg | ORAL_TABLET | Freq: Three times a day (TID) | ORAL | Status: AC | PRN

## 2014-03-21 MED ORDER — LORAZEPAM 2 MG/ML IJ SOLN
INTRAMUSCULAR | Status: AC
  Filled 2014-03-21: qty 1

## 2014-03-21 NOTE — Progress Notes (Signed)
[facility] OFFICE PROGRESS NOTE   Diagnosis:  Metastatic colon cancer.  INTERVAL HISTORY:   Natalie Mccarthy returns as scheduled. She was last treated with irinotecan/Panitumumab on 03/07/2014. She tends to have nausea on day 1. No vomiting. She developed a few mouth sores. The mouth sores did not interfere with her ability to eat or drink. No diarrhea. The skin rash is stable over her face an increased over the arms. She denies abdominal pain. She has a good appetite.  She periodically takes Xanax at bedtime and would like a refill.  Objective:  Vital signs in last 24 hours:  Blood pressure 116/72, pulse 70, temperature 96.9 F (36.1 C), temperature source Oral, resp. rate 19, height $RemoveBe'5\' 7"'iHJuOsbnb$  (1.702 m), weight 148 lb 14.4 oz (67.541 kg).    HEENT: Small ulceration at the left posterior buccal mucosa and lower inner lip. Resp: Lungs clear. Cardio: Regular cardiac rhythm. GI: Abdomen soft and nontender. No hepatomegaly. Vascular: No leg edema. Calves soft and nontender. Skin: Erythematous rash scattered over the face, neck, upper chest and arms.   Lab Results:  Lab Results  Component Value Date   WBC 2.1* 03/21/2014   HGB 11.1* 03/21/2014   HCT 34.6* 03/21/2014   MCV 83.0 03/21/2014   PLT 75* 03/21/2014   NEUTROABS 1.4* 03/21/2014    Imaging:  No results found.  Medications: I have reviewed the patient's current medications.  Assessment/Plan: 1.Metastatic colon cancer with multiple liver metastases noted on an MRI of the abdomen 12/02/2010 and on a CT 12/13/2010. A transverse colon mass was noted on the CT from 02/11/2011 and confirmed on a colonoscopy 12/17/2010. She began treatment with FOLFIRI/Avastin 12/31/2010. A restaging CT 06/20/2011 confirmed further improvement in the metastatic liver lesions and no evidence for progressive metastatic disease. A restaging CT 08/01/2011 showed stable liver lesions and no new lesions. Restaging CT 09/12/2011 showed stable  liver lesions and no new lesions. A restaging CT 10/27/2011 showed stable liver lesions and no new lesions. Restaging CT 12/05/2011 showed mild decrease in size of hepatic metastases and no evidence of disease progression. Restaging CT 01/26/2012 showed stable hepatic metastases and no new lesions. Restaging CT may 5/8/ 2013 revealed a decreased size of hepatic metastases and no new lesions. Restaging CT 04/23/2012 revealed no change in the measurable "target" lesions, a slight increase in the size of other lesions, and no new lesions. Restaging CT 06/04/2012 showed multiple hepatic metastases were grossly unchanged. Restaging CT 07/19/2012 with stable disease in the liver. Infusional 5-fluorouracil continued on a 2 week schedule. Treatment was held after the 08/03/2012 cycle due to thrombocytopenia. Restaging CT evaluation 08/30/2012 with mild increase in size of large hepatic metastases. Treatment was resumed with 5 fluorouracil and Avastin on 08/31/2012. Restaging CT 10/14/2012-stable hepatic metastases. Treatment continued with 5 fluorouracil and Avastin. Restaging CT 12/01/2012 revealed mild progression in the size of multiple hepatic lesions. Treatment was placed on hold. CEA 32.1 on 01/06/2013 and 71.9 on 03/11/2013. Restaging CT abdomen/pelvis 03/31/2013 with interval progression of liver metastases. She began cycle 1 CAPOX on 04/15/2013. The Xeloda was prematurely discontinued on 04/25/2013 due to redness and pain over the feet. She completed cycle 2 beginning 05/24/2013 and cycle 3 beginning on 07/05/2013. Disease progression presenting with severe abdominal pain. She began irinotecan/Panitumumab 09/05/2013. CEA markedly improved at 5.0 on 11/15/2013.  Restaging CT 11/25/2013 with improvement in hepatic metastases and abdominal lymphadenopathy.  Irinotecan/Panitumumab continued. 2. Abdominal pain-? Secondary to liver metastases, metastatic adenopathy, or the primary colon tumor.  Less likely peptic ulcer  disease. Improved.  3. Anorexia, early satiety, and weight loss secondary to metastatic colon cancer-resolved.  4. Microcytic anemia. Resolved.  5. History of migraine headaches.  6. Status post uterine fibroid surgery.  7. Port-A-Cath placement 12/20/2010.  8. Delayed nausea following cycle 1 of FOLFIRI/Avastin. Improved with the addition of Aloxi and prophylactic Decadron beginning with cycle #2.  9. Neutropenia secondary to chemotherapy. Cycle #4 was held on 02/11/2011. She subsequently received Neulasta support.  10. History of intermittent fever with no apparent source for infection, likely "tumor fever" or fever related to chemotherapy. No recent fever.  11. History of mucositis secondary to 5-FU, initially grade 2. Irinotecan and 5-FU were dose reduced per study guidelines.  12. History of abdominal cramping. ? Related to irinotecan, 5-FU, or potentially the colon tumor. No recent abdominal cramping.  13. Thrombocytopenia secondary to chemotherapy (while being treated with irinotecan and Avastin). Treatment was held for thrombocytopenia per study guidelines. The thrombocytopenia may be related to Avastin, or chronic liver disease from chemotherapy. The CT on 04/23/2012 confirmed mild splenomegaly. The CT on 06/04/2012 again showed splenomegaly. The thrombocytopenia persisted despite being off of irinotecan and Avastin for several months. The thrombocytopenia improved. She developed thrombocytopenia following cycle 1 and cycle 2 CAPOX. She again has thrombocytopenia.  6. Arthralgias-most likely unrelated to the colon cancer diagnosis and chemotherapy. She has been evaluated by orthopedics. The etiology of the arthralgias is unclear.  16. Persistent discomfort at the right shoulder. Negative CT of the right upper extremity on 07/19/2012. MRI on 08/06/2012 with a partial thickness bursal surface tear of the supraspinatus tendon, moderate rotator cuff tendinopathy/tendinosis. She is now followed by  orthopedics. The pain has improved. The range of motion at the right shoulder has improved  17.? Swelling at the right upper extremity and chest wall April 2014-negative right upper extremity Doppler.  18. Delayed nausea following cycle 1 CAPOX. Emend and Aloxi were added with cycle 2.  19. Mucositis and hand-foot syndrome secondary to Xeloda. The Xeloda was dose reduced to 1000 mg twice daily for 14 days with cycle 2.  20. Thrombocytopenia following cycle 1 CAPOX. Cycle 2 and cycle 3 were delayed due to thrombocytopenia. The oxaliplatin was further dose reduced with cycle 3.  21.New onset pruritus 04/04/2013-hyperbilirubinemia and an elevated alkaline phosphatase noted on labs 04/06/2013, the CT 03/31/2013 revealed peripheral biliary ductal dilatation in the medial segment of the left hepatic lobe,no options for intervention after evaluation by Dr. Watt Climes. The hyperbilirubinemia initially resolved and was mildly elevated on 07/26/2013.  22. Acute severe upper abdominal pain, pleuritic, with radiation to the chest/shoulders beginning 07/25/2013-etiology unclear. Chest CT 07/25/2013 showed no evidence of an acute pulmonary embolus and no acute or metastatic findings in the chest. CT abdomen/pelvis on 07/26/2013 showed interval slight progression of metastatic disease to the liver; no new hepatic metastases; slight progression of intrahepatic biliary ductal dilatation; interval marked increase in the size of the spleen; interval increase in the size of a portacaval lymph node and gastrohepatic ligament lymph nodes; contracted thickwalled gallbladder possibly containing sludge. The pain is likely related to metastatic disease involving the liver with possible irritation of the diaphragm. The pain has resolved.  23. Status post biopsy of a liver lesion 08/10/2013. Pathology confirmed metastatic adenocarcinoma. Extended K-ras testing returned negative for the K-ras mutation.  24. Constipation.  25. Panitumumab  skin rash. She continues minocycline and a moisturizer. She is using a steroid cream. The rash is stable overall.  26.  Insomnia, anxiety and restlessness following abrupt discontinuation of Percocet. Resolved after a slow Percocet taper.  27. Mucositis secondary to chemotherapy.  28. Acute nausea during the irinotecan infusion, lasting for approximately 24 hours. Improved with addition of Ativan to the premedication regimen.  29. Erythema over Port-A-Cath site following application of EMLA cream 11/01/2013-resolved     Disposition: She appears stable. Plan to proceed with irinotecan/Panitumumab today as scheduled. Restaging CT evaluation after this cycle. She will return for a followup visit in 2 weeks to review the results. She will contact the office in the interim with any problems.  We will call a refill for Xanax to her pharmacy.   Owens Shark ANP/GNP-BC   03/21/2014  12:14 PM

## 2014-03-21 NOTE — Telephone Encounter (Signed)
gv adn printed appt sched and avs for pt for June....sed added tx. °

## 2014-03-21 NOTE — Progress Notes (Signed)
Pt saw Lattie Haw, NP prior to chemo today.  Confirmed with Dr. Benay Spice -  OK to treat as planned.

## 2014-03-21 NOTE — Patient Instructions (Signed)
[facility] Discharge Instructions for Patients Receiving Chemotherapy  Today you received the following chemotherapy agents :  Vectibix,  Camptosar.  To help prevent nausea and vomiting after your treatment, we encourage you to take your nausea medication as prescribed by your physician.  Take Zofran 8mg  by mouth every 12 hours as needed for nausea.   Start 3 days after chemo - since you received Aloxi today prior to chemo.   If you develop nausea and vomiting that is not controlled by your nausea medication, call the clinic.   BELOW ARE SYMPTOMS THAT SHOULD BE REPORTED IMMEDIATELY:  *FEVER GREATER THAN 100.5 F  *CHILLS WITH OR WITHOUT FEVER  NAUSEA AND VOMITING THAT IS NOT CONTROLLED WITH YOUR NAUSEA MEDICATION  *UNUSUAL SHORTNESS OF BREATH  *UNUSUAL BRUISING OR BLEEDING  TENDERNESS IN MOUTH AND THROAT WITH OR WITHOUT PRESENCE OF ULCERS  *URINARY PROBLEMS  *BOWEL PROBLEMS  UNUSUAL RASH Items with * indicate a potential emergency and should be followed up as soon as possible.  Feel free to call the clinic you have any questions or concerns. The clinic phone number is [phone].

## 2014-03-21 NOTE — Telephone Encounter (Signed)
[facility] for prescription refill.

## 2014-03-22 LAB — CEA: CEA: 20.7 ng/mL — ABNORMAL HIGH (ref 0.0–5.0)

## 2014-03-23 ENCOUNTER — Telehealth: Payer: Self-pay | Admitting: *Deleted

## 2014-03-23 NOTE — Telephone Encounter (Signed)
Per Dr. Benay Spice; notified pt's husband (pt sleeping) cea is slightly higher, proceed with CT as scheduled.  Pt's husband verbalized understanding and expressed appreciation for call.

## 2014-03-23 NOTE — Telephone Encounter (Signed)
Message copied by Domenic Schwab on Thu Mar 23, 2014  1:16 PM ------      Message from: Betsy Coder B      Created: Wed Mar 22, 2014  9:23 PM       Please call patient, cea is slightly higher, proceed with CT as scheduled ------

## 2014-03-24 ENCOUNTER — Telehealth: Payer: Self-pay | Admitting: *Deleted

## 2014-03-24 NOTE — Telephone Encounter (Signed)
Received message from pt reports "spot on scalp different from rash on my face, I accidentally scratched and it was bleeding a little, what to do?"  Per Dr. Benay Spice; notified pt that MD thinks its the same rash that's on pt face and recommends pt putting her steroid cream on this.  Pt verbalized understanding and states she will do and confirmed CT scan for 03/31/14.

## 2014-03-31 ENCOUNTER — Ambulatory Visit (HOSPITAL_COMMUNITY)
Admission: RE | Admit: 2014-03-31 | Discharge: 2014-03-31 | Disposition: A | Discharge status: Home / Self Care | Payer: BC Managed Care – PPO | Source: Ambulatory Visit | Attending: Nurse Practitioner | Admitting: Nurse Practitioner

## 2014-03-31 ENCOUNTER — Ambulatory Visit (HOSPITAL_COMMUNITY): Payer: BC Managed Care – PPO

## 2014-03-31 ENCOUNTER — Encounter (HOSPITAL_COMMUNITY): Payer: Self-pay

## 2014-03-31 DIAGNOSIS — C189 Malignant neoplasm of colon, unspecified: Secondary | ICD-10-CM | POA: Insufficient documentation

## 2014-03-31 DIAGNOSIS — K769 Liver disease, unspecified: Secondary | ICD-10-CM | POA: Insufficient documentation

## 2014-03-31 DIAGNOSIS — I868 Varicose veins of other specified sites: Secondary | ICD-10-CM | POA: Insufficient documentation

## 2014-03-31 MED ORDER — IOHEXOL 300 MG/ML  SOLN
50.0000 mL | Freq: Once | INTRAMUSCULAR | Status: AC | PRN
Start: 2014-03-31 — End: 2014-03-31
  Administered 2014-03-31: 50 mL via ORAL

## 2014-03-31 MED ORDER — IOHEXOL 300 MG/ML  SOLN
80.0000 mL | Freq: Once | INTRAMUSCULAR | Status: AC | PRN
  Administered 2014-03-31: 80 mL via INTRAVENOUS

## 2014-04-02 ENCOUNTER — Other Ambulatory Visit: Payer: Self-pay | Admitting: Oncology

## 2014-04-04 ENCOUNTER — Ambulatory Visit (HOSPITAL_BASED_OUTPATIENT_CLINIC_OR_DEPARTMENT_OTHER): Payer: BC Managed Care – PPO

## 2014-04-04 ENCOUNTER — Ambulatory Visit (HOSPITAL_BASED_OUTPATIENT_CLINIC_OR_DEPARTMENT_OTHER): Payer: BC Managed Care – PPO | Admitting: Oncology

## 2014-04-04 ENCOUNTER — Telehealth: Payer: Self-pay | Admitting: Oncology

## 2014-04-04 ENCOUNTER — Other Ambulatory Visit (HOSPITAL_BASED_OUTPATIENT_CLINIC_OR_DEPARTMENT_OTHER): Payer: BC Managed Care – PPO

## 2014-04-04 VITALS — BP 130/65 | HR 80 | Temp 98.0°F | Resp 18 | Ht 67.0 in | Wt 148.8 lb

## 2014-04-04 DIAGNOSIS — K5900 Constipation, unspecified: Secondary | ICD-10-CM

## 2014-04-04 DIAGNOSIS — K1231 Oral mucositis (ulcerative) due to antineoplastic therapy: Secondary | ICD-10-CM

## 2014-04-04 DIAGNOSIS — C184 Malignant neoplasm of transverse colon: Secondary | ICD-10-CM

## 2014-04-04 DIAGNOSIS — C189 Malignant neoplasm of colon, unspecified: Secondary | ICD-10-CM

## 2014-04-04 DIAGNOSIS — R21 Rash and other nonspecific skin eruption: Secondary | ICD-10-CM

## 2014-04-04 DIAGNOSIS — D696 Thrombocytopenia, unspecified: Secondary | ICD-10-CM

## 2014-04-04 DIAGNOSIS — C787 Secondary malignant neoplasm of liver and intrahepatic bile duct: Secondary | ICD-10-CM

## 2014-04-04 DIAGNOSIS — Z5111 Encounter for antineoplastic chemotherapy: Secondary | ICD-10-CM

## 2014-04-04 DIAGNOSIS — Z5112 Encounter for antineoplastic immunotherapy: Secondary | ICD-10-CM

## 2014-04-04 LAB — CBC WITH DIFFERENTIAL/PLATELET
BASO%: 0.9 % (ref 0.0–2.0)
BASOS ABS: 0 10*3/uL (ref 0.0–0.1)
EOS%: 7 % (ref 0.0–7.0)
Eosinophils Absolute: 0.2 10*3/uL (ref 0.0–0.5)
HCT: 34.4 % — ABNORMAL LOW (ref 34.8–46.6)
HEMOGLOBIN: 11.1 g/dL — AB (ref 11.6–15.9)
LYMPH%: 14.2 % (ref 14.0–49.7)
MCH: 26.2 pg (ref 25.1–34.0)
MCHC: 32.2 g/dL (ref 31.5–36.0)
MCV: 81.4 fL (ref 79.5–101.0)
MONO#: 0.3 10*3/uL (ref 0.1–0.9)
MONO%: 12 % (ref 0.0–14.0)
NEUT#: 1.7 10*3/uL (ref 1.5–6.5)
NEUT%: 65.9 % (ref 38.4–76.8)
PLATELETS: 80 10*3/uL — AB (ref 145–400)
RBC: 4.23 10*6/uL (ref 3.70–5.45)
RDW: 20 % — ABNORMAL HIGH (ref 11.2–14.5)
WBC: 2.6 10*3/uL — ABNORMAL LOW (ref 3.9–10.3)
lymph#: 0.4 10*3/uL — ABNORMAL LOW (ref 0.9–3.3)

## 2014-04-04 LAB — MAGNESIUM (CC13): MAGNESIUM: 1.5 mg/dL (ref 1.5–2.5)

## 2014-04-04 LAB — COMPREHENSIVE METABOLIC PANEL (CC13)
ALBUMIN: 3.5 g/dL (ref 3.5–5.0)
ALK PHOS: 292 U/L — AB (ref 40–150)
ALT: 20 U/L (ref 0–55)
AST: 29 U/L (ref 5–34)
Anion Gap: 12 mEq/L — ABNORMAL HIGH (ref 3–11)
BILIRUBIN TOTAL: 0.6 mg/dL (ref 0.20–1.20)
BUN: 10.7 mg/dL (ref 7.0–26.0)
CO2: 24 mEq/L (ref 22–29)
CREATININE: 0.7 mg/dL (ref 0.6–1.1)
Calcium: 9.2 mg/dL (ref 8.4–10.4)
Chloride: 107 mEq/L (ref 98–109)
GLUCOSE: 100 mg/dL (ref 70–140)
POTASSIUM: 4.4 meq/L (ref 3.5–5.1)
Sodium: 143 mEq/L (ref 136–145)
Total Protein: 6.4 g/dL (ref 6.4–8.3)

## 2014-04-04 MED ORDER — SODIUM CHLORIDE 0.9 % IV SOLN
Freq: Once | INTRAVENOUS | Status: AC
  Administered 2014-04-04: 10:00:00 via INTRAVENOUS

## 2014-04-04 MED ORDER — IRINOTECAN HCL CHEMO INJECTION 100 MG/5ML
100.0000 mg/m2 | Freq: Once | INTRAVENOUS | Status: AC
  Administered 2014-04-04: 182 mg via INTRAVENOUS
  Filled 2014-04-04: qty 9.1

## 2014-04-04 MED ORDER — DEXAMETHASONE SODIUM PHOSPHATE 10 MG/ML IJ SOLN
10.0000 mg | Freq: Once | INTRAMUSCULAR | Status: AC
  Administered 2014-04-04: 10 mg via INTRAVENOUS

## 2014-04-04 MED ORDER — SODIUM CHLORIDE 0.9 % IV SOLN
6.0000 mg/kg | Freq: Once | INTRAVENOUS | Status: AC
  Administered 2014-04-04: 420 mg via INTRAVENOUS
  Filled 2014-04-04: qty 21

## 2014-04-04 MED ORDER — DEXAMETHASONE SODIUM PHOSPHATE 10 MG/ML IJ SOLN
INTRAMUSCULAR | Status: AC
  Filled 2014-04-04: qty 1

## 2014-04-04 MED ORDER — LORAZEPAM 2 MG/ML IJ SOLN
0.5000 mg | Freq: Once | INTRAMUSCULAR | Status: AC
  Administered 2014-04-04: 0.5 mg via INTRAVENOUS

## 2014-04-04 MED ORDER — LORAZEPAM 2 MG/ML IJ SOLN
INTRAMUSCULAR | Status: AC
  Filled 2014-04-04: qty 1

## 2014-04-04 MED ORDER — HEPARIN SOD (PORK) LOCK FLUSH 100 UNIT/ML IV SOLN
500.0000 [IU] | Freq: Once | INTRAVENOUS | Status: AC | PRN
  Administered 2014-04-04: 500 [IU]
  Filled 2014-04-04: qty 5

## 2014-04-04 MED ORDER — PALONOSETRON HCL INJECTION 0.25 MG/5ML
INTRAVENOUS | Status: AC
  Filled 2014-04-04: qty 5

## 2014-04-04 MED ORDER — PALONOSETRON HCL INJECTION 0.25 MG/5ML
0.2500 mg | Freq: Once | INTRAVENOUS | Status: AC
  Administered 2014-04-04: 0.25 mg via INTRAVENOUS

## 2014-04-04 MED ORDER — SODIUM CHLORIDE 0.9 % IJ SOLN
10.0000 mL | INTRAMUSCULAR | Status: DC | PRN
  Administered 2014-04-04: 10 mL
  Filled 2014-04-04: qty 10

## 2014-04-04 NOTE — Progress Notes (Signed)
[facility] OFFICE PROGRESS NOTE   Diagnosis: Colon cancer  INTERVAL HISTORY:   She returns as scheduled. She continues every 2 week irinotecan and panitumumab. No diarrhea or abdominal pain. She has nausea following the irinotecan infusion. She continues to work.  The skin rash over the face has improved with topical creams.  Objective:  Vital signs in last 24 hours:  Blood pressure 130/65, pulse 80, temperature 98 F (36.7 C), temperature source Oral, resp. rate 18, height 5' 7" (1.702 m), weight 148 lb 12.8 oz (67.495 kg).    HEENT: No thrush or ulcers Resp: Lungs clear bilaterally Cardio: Regular rate and rhythm GI: No hepatomegaly, nontender. No spinal megaly. Vascular: No leg edema  Skin: Acne type rash over the face, neck, and upper chest. A few acne lesions over the back   Portacath/PICC-without erythema  Lab Results:  Lab Results  Component Value Date   WBC 2.6* 04/04/2014   HGB 11.1* 04/04/2014   HCT 34.4* 04/04/2014   MCV 81.4 04/04/2014   PLT 80* 04/04/2014   NEUTROABS 1.7 04/04/2014     Lab Results  Component Value Date   CEA 20.7* 03/21/2014    Imaging: CT of the abdomen and pelvis 03/31/2014, compared to 11/25/2013-decreased size of a lesion at the posterior dome of the liver, slight decrease in the confluent and partially calcified mass at the dome of the liver, possible new nodular component medially. Splenomegaly. Varices.  No results found.  Medications: I have reviewed the patient's current medications.  Assessment/Plan: 1.Metastatic colon cancer with multiple liver metastases noted on an MRI of the abdomen 12/02/2010 and on a CT 12/13/2010. A transverse colon mass was noted on the CT from 02/11/2011 and confirmed on a colonoscopy 12/17/2010. She began treatment with FOLFIRI/Avastin 12/31/2010. A restaging CT 06/20/2011 confirmed further improvement in the metastatic liver lesions and no evidence for progressive metastatic disease. A  restaging CT 08/01/2011 showed stable liver lesions and no new lesions. Restaging CT 09/12/2011 showed stable liver lesions and no new lesions. A restaging CT 10/27/2011 showed stable liver lesions and no new lesions. Restaging CT 12/05/2011 showed mild decrease in size of hepatic metastases and no evidence of disease progression. Restaging CT 01/26/2012 showed stable hepatic metastases and no new lesions. Restaging CT may 5/8/ 2013 revealed a decreased size of hepatic metastases and no new lesions. Restaging CT 04/23/2012 revealed no change in the measurable "target" lesions, a slight increase in the size of other lesions, and no new lesions. Restaging CT 06/04/2012 showed multiple hepatic metastases were grossly unchanged. Restaging CT 07/19/2012 with stable disease in the liver. Infusional 5-fluorouracil continued on a 2 week schedule. Treatment was held after the 08/03/2012 cycle due to thrombocytopenia. Restaging CT evaluation 08/30/2012 with mild increase in size of large hepatic metastases. Treatment was resumed with 5 fluorouracil and Avastin on 08/31/2012. Restaging CT 10/14/2012-stable hepatic metastases. Treatment continued with 5 fluorouracil and Avastin. Restaging CT 12/01/2012 revealed mild progression in the size of multiple hepatic lesions. Treatment was placed on hold. CEA 32.1 on 01/06/2013 and 71.9 on 03/11/2013. Restaging CT abdomen/pelvis 03/31/2013 with interval progression of liver metastases. She began cycle 1 CAPOX on 04/15/2013. The Xeloda was prematurely discontinued on 04/25/2013 due to redness and pain over the feet. She completed cycle 2 beginning 05/24/2013 and cycle 3 beginning on 07/05/2013. Disease progression presenting with severe abdominal pain. She began irinotecan/Panitumumab 09/05/2013. CEA markedly improved at 5.0 on 11/15/2013.  Restaging CT 11/25/2013 with improvement in hepatic metastases and abdominal  lymphadenopathy.  Irinotecan/Panitumumab continued. Restaging CT  03/31/2014 with stable to slight improvement in liver metastases 2. Abdominal pain-? Secondary to liver metastases, metastatic adenopathy, or the primary colon tumor. Less likely peptic ulcer disease. Improved.  3. Anorexia, early satiety, and weight loss secondary to metastatic colon cancer-resolved.  4. Microcytic anemia. Resolved.  5. History of migraine headaches.  6. Status post uterine fibroid surgery.  7. Port-A-Cath placement 12/20/2010.  8. Delayed nausea following cycle 1 of FOLFIRI/Avastin. Improved with the addition of Aloxi and prophylactic Decadron beginning with cycle #2.  9. Neutropenia secondary to chemotherapy. Cycle #4 was held on 02/11/2011. She subsequently received Neulasta support.  10. History of intermittent fever with no apparent source for infection, likely "tumor fever" or fever related to chemotherapy. No recent fever.  11. History of mucositis secondary to 5-FU, initially grade 2. Irinotecan and 5-FU were dose reduced per study guidelines.  12. History of abdominal cramping. ? Related to irinotecan, 5-FU, or potentially the colon tumor. No recent abdominal cramping.  13. Thrombocytopenia secondary to chemotherapy (while being treated with irinotecan and Avastin). Treatment was held for thrombocytopenia per study guidelines. The thrombocytopenia may be related to Avastin, or chronic liver disease from chemotherapy. The CT on 04/23/2012 confirmed mild splenomegaly. The CT on 06/04/2012 again showed splenomegaly. The thrombocytopenia persisted despite being off of irinotecan and Avastin for several months. The thrombocytopenia improved. She developed thrombocytopenia following cycle 1 and cycle 2 CAPOX. She again has thrombocytopenia. Likely related to chronic liver disease and splenomegaly the 15. Arthralgias-most likely unrelated to the colon cancer diagnosis and chemotherapy. She has been evaluated by orthopedics. The etiology of the arthralgias is unclear.  16. Persistent  discomfort at the right shoulder. Negative CT of the right upper extremity on 07/19/2012. MRI on 08/06/2012 with a partial thickness bursal surface tear of the supraspinatus tendon, moderate rotator cuff tendinopathy/tendinosis. She is now followed by orthopedics. The pain has improved. The range of motion at the right shoulder has improved  17.? Swelling at the right upper extremity and chest wall April 2014-negative right upper extremity Doppler.  18. Delayed nausea following cycle 1 CAPOX. Emend and Aloxi were added with cycle 2.  19. Mucositis and hand-foot syndrome secondary to Xeloda. The Xeloda was dose reduced to 1000 mg twice daily for 14 days with cycle 2.  20. Thrombocytopenia following cycle 1 CAPOX. Cycle 2 and cycle 3 were delayed due to thrombocytopenia. The oxaliplatin was further dose reduced with cycle 3.  21.New onset pruritus 04/04/2013-hyperbilirubinemia and an elevated alkaline phosphatase noted on labs 04/06/2013, the CT 03/31/2013 revealed peripheral biliary ductal dilatation in the medial segment of the left hepatic lobe,no options for intervention after evaluation by Dr. Watt Climes. The hyperbilirubinemia initially resolved and was mildly elevated on 07/26/2013.  22. Acute severe upper abdominal pain, pleuritic, with radiation to the chest/shoulders beginning 07/25/2013-etiology unclear. Chest CT 07/25/2013 showed no evidence of an acute pulmonary embolus and no acute or metastatic findings in the chest. CT abdomen/pelvis on 07/26/2013 showed interval slight progression of metastatic disease to the liver; no new hepatic metastases; slight progression of intrahepatic biliary ductal dilatation; interval marked increase in the size of the spleen; interval increase in the size of a portacaval lymph node and gastrohepatic ligament lymph nodes; contracted thickwalled gallbladder possibly containing sludge. The pain is likely related to metastatic disease involving the liver with possible  irritation of the diaphragm. The pain has resolved.  23. Status post biopsy of a liver lesion 08/10/2013. Pathology confirmed metastatic  adenocarcinoma. Extended K-ras testing returned negative for the K-ras mutation.  24. Constipation.  25. Panitumumab skin rash. She continues minocycline and a moisturizer. She is using a steroid cream. The rash over the face has improved.  26. Insomnia, anxiety and restlessness following abrupt discontinuation of Percocet. Resolved after a slow Percocet taper.  27. Mucositis secondary to chemotherapy.  28. Acute nausea during the irinotecan infusion, lasting for approximately 24 hours. Improved with addition of Ativan to the premedication regimen.  29. Erythema over Port-A-Cath site following application of EMLA cream 11/01/2013-resolved    Disposition:  Her overall performance status appears unchanged. The CEA was slightly higher 03/21/2014. I reviewed the restaging CT in radiology. The overall hepatic tumor burden appears improved. I reviewed the CT images with Ms. Vaeth and her husband. The plan is to continue irinotecan/panitumumab on a 2 week schedule. We will follow her clinical status and the CEA.  We discussed the possibility of hepatic directed therapy in the future. I will present her case at the GI tumor conference to see if she may be a candidate for radioemobolization.  Ladell Pier, MD  04/04/2014  9:12 AM

## 2014-04-04 NOTE — Telephone Encounter (Signed)
[facility] PT APPT SCHEDULE FOR JUNE. POF FOR 6/1 DID NOT CROSS OVER TO SCHEDULING AND WAS OBTAINED FROM OFFICE NOTE.

## 2014-04-04 NOTE — Patient Instructions (Signed)
[facility] Discharge Instructions for Patients Receiving Chemotherapy  Today you received the following chemotherapy agents Irinotecan,Vectibix  To help prevent nausea and vomiting after your treatment, we encourage you to take your nausea medication asprescribed.   If you develop nausea and vomiting that is not controlled by your nausea medication, call the clinic.   BELOW ARE SYMPTOMS THAT SHOULD BE REPORTED IMMEDIATELY:  *FEVER GREATER THAN 100.5 F  *CHILLS WITH OR WITHOUT FEVER  NAUSEA AND VOMITING THAT IS NOT CONTROLLED WITH YOUR NAUSEA MEDICATION  *UNUSUAL SHORTNESS OF BREATH  *UNUSUAL BRUISING OR BLEEDING  TENDERNESS IN MOUTH AND THROAT WITH OR WITHOUT PRESENCE OF ULCERS  *URINARY PROBLEMS  *BOWEL PROBLEMS  UNUSUAL RASH Items with * indicate a potential emergency and should be followed up as soon as possible.  Feel free to call the clinic you have any questions or concerns. The clinic phone number is [phone].    

## 2014-04-10 ENCOUNTER — Other Ambulatory Visit: Payer: Self-pay | Admitting: *Deleted

## 2014-04-10 DIAGNOSIS — C189 Malignant neoplasm of colon, unspecified: Secondary | ICD-10-CM

## 2014-04-10 MED ORDER — MINOCYCLINE HCL 100 MG PO CAPS: 100.0000 mg | ORAL_CAPSULE | Freq: Two times a day (BID) | ORAL | Status: DC

## 2014-04-16 ENCOUNTER — Other Ambulatory Visit: Payer: Self-pay | Admitting: Oncology

## 2014-04-18 ENCOUNTER — Other Ambulatory Visit (HOSPITAL_BASED_OUTPATIENT_CLINIC_OR_DEPARTMENT_OTHER): Payer: BC Managed Care – PPO

## 2014-04-18 ENCOUNTER — Ambulatory Visit (HOSPITAL_BASED_OUTPATIENT_CLINIC_OR_DEPARTMENT_OTHER): Payer: BC Managed Care – PPO

## 2014-04-18 ENCOUNTER — Other Ambulatory Visit: Payer: Self-pay | Admitting: Nurse Practitioner

## 2014-04-18 ENCOUNTER — Telehealth: Payer: Self-pay | Admitting: *Deleted

## 2014-04-18 ENCOUNTER — Telehealth: Payer: Self-pay | Admitting: Oncology

## 2014-04-18 ENCOUNTER — Ambulatory Visit (HOSPITAL_BASED_OUTPATIENT_CLINIC_OR_DEPARTMENT_OTHER): Payer: BC Managed Care – PPO | Admitting: Nurse Practitioner

## 2014-04-18 VITALS — BP 132/62 | HR 73 | Temp 97.8°F | Resp 18 | Ht 67.0 in | Wt 147.5 lb

## 2014-04-18 DIAGNOSIS — K1231 Oral mucositis (ulcerative) due to antineoplastic therapy: Secondary | ICD-10-CM

## 2014-04-18 DIAGNOSIS — C189 Malignant neoplasm of colon, unspecified: Secondary | ICD-10-CM

## 2014-04-18 DIAGNOSIS — Z5112 Encounter for antineoplastic immunotherapy: Secondary | ICD-10-CM

## 2014-04-18 DIAGNOSIS — C184 Malignant neoplasm of transverse colon: Secondary | ICD-10-CM

## 2014-04-18 DIAGNOSIS — C787 Secondary malignant neoplasm of liver and intrahepatic bile duct: Secondary | ICD-10-CM

## 2014-04-18 DIAGNOSIS — Z5111 Encounter for antineoplastic chemotherapy: Secondary | ICD-10-CM

## 2014-04-18 DIAGNOSIS — R21 Rash and other nonspecific skin eruption: Secondary | ICD-10-CM

## 2014-04-18 LAB — COMPREHENSIVE METABOLIC PANEL (CC13)
ALBUMIN: 3.6 g/dL (ref 3.5–5.0)
ALT: 25 U/L (ref 0–55)
AST: 34 U/L (ref 5–34)
Alkaline Phosphatase: 298 U/L — ABNORMAL HIGH (ref 40–150)
Anion Gap: 8 mEq/L (ref 3–11)
BUN: 11.2 mg/dL (ref 7.0–26.0)
CALCIUM: 8.9 mg/dL (ref 8.4–10.4)
CHLORIDE: 109 meq/L (ref 98–109)
CO2: 27 meq/L (ref 22–29)
Creatinine: 0.7 mg/dL (ref 0.6–1.1)
Glucose: 110 mg/dl (ref 70–140)
POTASSIUM: 3.9 meq/L (ref 3.5–5.1)
SODIUM: 143 meq/L (ref 136–145)
TOTAL PROTEIN: 6.5 g/dL (ref 6.4–8.3)
Total Bilirubin: 0.75 mg/dL (ref 0.20–1.20)

## 2014-04-18 LAB — CBC WITH DIFFERENTIAL/PLATELET
BASO%: 0.9 % (ref 0.0–2.0)
Basophils Absolute: 0 10*3/uL (ref 0.0–0.1)
EOS ABS: 0.2 10*3/uL (ref 0.0–0.5)
EOS%: 5.3 % (ref 0.0–7.0)
HCT: 33.9 % — ABNORMAL LOW (ref 34.8–46.6)
HGB: 10.9 g/dL — ABNORMAL LOW (ref 11.6–15.9)
LYMPH%: 11.5 % — ABNORMAL LOW (ref 14.0–49.7)
MCH: 26.2 pg (ref 25.1–34.0)
MCHC: 32.2 g/dL (ref 31.5–36.0)
MCV: 81.5 fL (ref 79.5–101.0)
MONO#: 0.4 10*3/uL (ref 0.1–0.9)
MONO%: 11.2 % (ref 0.0–14.0)
NEUT%: 71.1 % (ref 38.4–76.8)
NEUTROS ABS: 2.3 10*3/uL (ref 1.5–6.5)
NRBC: 0 % (ref 0–0)
Platelets: 96 10*3/uL — ABNORMAL LOW (ref 145–400)
RBC: 4.16 10*6/uL (ref 3.70–5.45)
RDW: 18.8 % — ABNORMAL HIGH (ref 11.2–14.5)
WBC: 3.2 10*3/uL — AB (ref 3.9–10.3)
lymph#: 0.4 10*3/uL — ABNORMAL LOW (ref 0.9–3.3)

## 2014-04-18 LAB — TECHNOLOGIST REVIEW

## 2014-04-18 MED ORDER — SODIUM CHLORIDE 0.9 % IV SOLN
6.0000 mg/kg | Freq: Once | INTRAVENOUS | Status: AC
  Administered 2014-04-18: 420 mg via INTRAVENOUS
  Filled 2014-04-18: qty 21

## 2014-04-18 MED ORDER — PALONOSETRON HCL INJECTION 0.25 MG/5ML
INTRAVENOUS | Status: AC
  Filled 2014-04-18: qty 5

## 2014-04-18 MED ORDER — SODIUM CHLORIDE 0.9 % IV SOLN
Freq: Once | INTRAVENOUS | Status: AC
  Administered 2014-04-18: 13:00:00 via INTRAVENOUS

## 2014-04-18 MED ORDER — LORAZEPAM 2 MG/ML IJ SOLN
0.5000 mg | Freq: Once | INTRAMUSCULAR | Status: AC
  Administered 2014-04-18: 0.5 mg via INTRAVENOUS

## 2014-04-18 MED ORDER — DEXAMETHASONE SODIUM PHOSPHATE 10 MG/ML IJ SOLN
INTRAMUSCULAR | Status: AC
  Filled 2014-04-18: qty 1

## 2014-04-18 MED ORDER — DEXAMETHASONE SODIUM PHOSPHATE 10 MG/ML IJ SOLN
10.0000 mg | Freq: Once | INTRAMUSCULAR | Status: AC
  Administered 2014-04-18: 10 mg via INTRAVENOUS

## 2014-04-18 MED ORDER — HEPARIN SOD (PORK) LOCK FLUSH 100 UNIT/ML IV SOLN
500.0000 [IU] | Freq: Once | INTRAVENOUS | Status: AC | PRN
  Administered 2014-04-18: 500 [IU]
  Filled 2014-04-18: qty 5

## 2014-04-18 MED ORDER — IRINOTECAN HCL CHEMO INJECTION 100 MG/5ML
100.0000 mg/m2 | Freq: Once | INTRAVENOUS | Status: AC
  Administered 2014-04-18: 182 mg via INTRAVENOUS
  Filled 2014-04-18: qty 9.1

## 2014-04-18 MED ORDER — PALONOSETRON HCL INJECTION 0.25 MG/5ML
0.2500 mg | Freq: Once | INTRAVENOUS | Status: AC
  Administered 2014-04-18: 0.25 mg via INTRAVENOUS

## 2014-04-18 MED ORDER — SODIUM CHLORIDE 0.9 % IJ SOLN
10.0000 mL | INTRAMUSCULAR | Status: DC | PRN
  Administered 2014-04-18: 10 mL via INTRAVENOUS
  Filled 2014-04-18: qty 10

## 2014-04-18 MED ORDER — TRAMADOL HCL 50 MG PO TABS: 50.0000 mg | ORAL_TABLET | Freq: Four times a day (QID) | ORAL | Status: AC | PRN

## 2014-04-18 MED ORDER — SODIUM CHLORIDE 0.9 % IJ SOLN
10.0000 mL | INTRAMUSCULAR | Status: DC | PRN
  Administered 2014-04-18: 10 mL
  Filled 2014-04-18: qty 10

## 2014-04-18 MED ORDER — LORAZEPAM 2 MG/ML IJ SOLN
INTRAMUSCULAR | Status: AC
  Filled 2014-04-18: qty 1

## 2014-04-18 NOTE — Telephone Encounter (Signed)
gv adn printed appt sched and avs for pt for June....emailed MW to add tx.

## 2014-04-18 NOTE — Telephone Encounter (Signed)
Per staff message and POF I have scheduled appts. Advised scheduler of appts. Advise scheduler that 100pm is the first available.  JMW

## 2014-04-18 NOTE — Progress Notes (Addendum)
[facility] OFFICE PROGRESS NOTE   Diagnosis:  Colon cancer.  INTERVAL HISTORY:   Natalie Mccarthy returns as scheduled. Natalie Mccarthy continues every 2 week irinotecan and Panitumumab. The rash over her face continues to be improved. Natalie Mccarthy reports multiple red, scabbed, painful scalp lesions. Natalie Mccarthy has nausea for the first few days after chemotherapy. Following the most recent cycle Natalie Mccarthy had one episode of vomiting. No diarrhea. Natalie Mccarthy has noted intermittent crampy abdominal pain for several days following the last 3 or 4 cycles of chemotherapy. Natalie Mccarthy denies abdominal pain at present.  Objective:  Vital signs in last 24 hours:  Blood pressure 132/62, pulse 73, temperature 97.8 F (36.6 C), temperature source Oral, resp. rate 18, height $RemoveBe'5\' 7"'gIJWSeYyp$  (1.702 m), weight 147 lb 8 oz (66.906 kg), SpO2 100.00%.    HEENT: Multiple large scabbed scalp lesions with surrounding erythema. No thrush or ulcerations. Resp: Lungs clear. Cardio: Regular cardiac rhythm. GI: Abdomen soft and nontender. No hepatomegaly. Vascular: No leg edema. Skin: Faint acne-type rash over the face, neck and upper chest.  Port-A-Cath site without erythema.   Lab Results:  Lab Results  Component Value Date   WBC 3.2* 04/18/2014   HGB 10.9* 04/18/2014   HCT 33.9* 04/18/2014   MCV 81.5 04/18/2014   PLT 96 Large platelets present* 04/18/2014   NEUTROABS 2.3 04/18/2014    Imaging:  No results found.  Medications: I have reviewed the patient's current medications.  Assessment/Plan: 1.Metastatic colon cancer with multiple liver metastases noted on an MRI of the abdomen 12/02/2010 and on a CT 12/13/2010. A transverse colon mass was noted on the CT from 02/11/2011 and confirmed on a colonoscopy 12/17/2010. Natalie Mccarthy began treatment with FOLFIRI/Avastin 12/31/2010. A restaging CT 06/20/2011 confirmed further improvement in the metastatic liver lesions and no evidence for progressive metastatic disease. A restaging CT 08/01/2011 showed  stable liver lesions and no new lesions. Restaging CT 09/12/2011 showed stable liver lesions and no new lesions. A restaging CT 10/27/2011 showed stable liver lesions and no new lesions. Restaging CT 12/05/2011 showed mild decrease in size of hepatic metastases and no evidence of disease progression. Restaging CT 01/26/2012 showed stable hepatic metastases and no new lesions. Restaging CT may 5/8/ 2013 revealed a decreased size of hepatic metastases and no new lesions. Restaging CT 04/23/2012 revealed no change in the measurable "target" lesions, a slight increase in the size of other lesions, and no new lesions. Restaging CT 06/04/2012 showed multiple hepatic metastases were grossly unchanged. Restaging CT 07/19/2012 with stable disease in the liver. Infusional 5-fluorouracil continued on a 2 week schedule. Treatment was held after the 08/03/2012 cycle due to thrombocytopenia. Restaging CT evaluation 08/30/2012 with mild increase in size of large hepatic metastases. Treatment was resumed with 5 fluorouracil and Avastin on 08/31/2012. Restaging CT 10/14/2012-stable hepatic metastases. Treatment continued with 5 fluorouracil and Avastin. Restaging CT 12/01/2012 revealed mild progression in the size of multiple hepatic lesions. Treatment was placed on hold. CEA 32.1 on 01/06/2013 and 71.9 on 03/11/2013. Restaging CT abdomen/pelvis 03/31/2013 with interval progression of liver metastases. Natalie Mccarthy began cycle 1 CAPOX on 04/15/2013. The Xeloda was prematurely discontinued on 04/25/2013 due to redness and pain over the feet. Natalie Mccarthy completed cycle 2 beginning 05/24/2013 and cycle 3 beginning on 07/05/2013. Disease progression presenting with severe abdominal pain. Natalie Mccarthy began irinotecan/Panitumumab 09/05/2013. CEA markedly improved at 5.0 on 11/15/2013.  Restaging CT 11/25/2013 with improvement in hepatic metastases and abdominal lymphadenopathy.  Irinotecan/Panitumumab continued.  Restaging CT 03/31/2014 with stable to slight  improvement  in liver metastases. Irinotecan/Panitumumab continued. 2. Abdominal pain-? Secondary to liver metastases, metastatic adenopathy, or the primary colon tumor. Less likely peptic ulcer disease. Improved.  3. Anorexia, early satiety, and weight loss secondary to metastatic colon cancer-resolved.  4. Microcytic anemia. Resolved.  5. History of migraine headaches.  6. Status post uterine fibroid surgery.  7. Port-A-Cath placement 12/20/2010.  8. Delayed nausea following cycle 1 of FOLFIRI/Avastin. Improved with the addition of Aloxi and prophylactic Decadron beginning with cycle #2.  9. Neutropenia secondary to chemotherapy. Cycle #4 was held on 02/11/2011. Natalie Mccarthy subsequently received Neulasta support.  10. History of intermittent fever with no apparent source for infection, likely "tumor fever" or fever related to chemotherapy. No recent fever.  11. History of mucositis secondary to 5-FU, initially grade 2. Irinotecan and 5-FU were dose reduced per study guidelines.  12. History of abdominal cramping. ? Related to irinotecan, 5-FU, or potentially the colon tumor. No recent abdominal cramping.  13. Thrombocytopenia secondary to chemotherapy (while being treated with irinotecan and Avastin). Treatment was held for thrombocytopenia per study guidelines. The thrombocytopenia may be related to Avastin, or chronic liver disease from chemotherapy. The CT on 04/23/2012 confirmed mild splenomegaly. The CT on 06/04/2012 again showed splenomegaly. The thrombocytopenia persisted despite being off of irinotecan and Avastin for several months. The thrombocytopenia improved. Natalie Mccarthy developed thrombocytopenia following cycle 1 and cycle 2 CAPOX. Natalie Mccarthy again has thrombocytopenia. Likely related to chronic liver disease and splenomegaly the  15. Arthralgias-most likely unrelated to the colon cancer diagnosis and chemotherapy. Natalie Mccarthy has been evaluated by orthopedics. The etiology of the arthralgias is unclear.  16.  Persistent discomfort at the right shoulder. Negative CT of the right upper extremity on 07/19/2012. MRI on 08/06/2012 with a partial thickness bursal surface tear of the supraspinatus tendon, moderate rotator cuff tendinopathy/tendinosis. Natalie Mccarthy is now followed by orthopedics. The pain has improved. The range of motion at the right shoulder has improved  17.? Swelling at the right upper extremity and chest wall April 2014-negative right upper extremity Doppler.  18. Delayed nausea following cycle 1 CAPOX. Emend and Aloxi were added with cycle 2.  19. Mucositis and hand-foot syndrome secondary to Xeloda. The Xeloda was dose reduced to 1000 mg twice daily for 14 days with cycle 2.  20. Thrombocytopenia following cycle 1 CAPOX. Cycle 2 and cycle 3 were delayed due to thrombocytopenia. The oxaliplatin was further dose reduced with cycle 3.  21.New onset pruritus 04/04/2013-hyperbilirubinemia and an elevated alkaline phosphatase noted on labs 04/06/2013, the CT 03/31/2013 revealed peripheral biliary ductal dilatation in the medial segment of the left hepatic lobe,no options for intervention after evaluation by Dr. Watt Climes. The hyperbilirubinemia initially resolved and was mildly elevated on 07/26/2013.  22. Acute severe upper abdominal pain, pleuritic, with radiation to the chest/shoulders beginning 07/25/2013-etiology unclear. Chest CT 07/25/2013 showed no evidence of an acute pulmonary embolus and no acute or metastatic findings in the chest. CT abdomen/pelvis on 07/26/2013 showed interval slight progression of metastatic disease to the liver; no new hepatic metastases; slight progression of intrahepatic biliary ductal dilatation; interval marked increase in the size of the spleen; interval increase in the size of a portacaval lymph node and gastrohepatic ligament lymph nodes; contracted thickwalled gallbladder possibly containing sludge. The pain is likely related to metastatic disease involving the liver with  possible irritation of the diaphragm. The pain has resolved.  23. Status post biopsy of a liver lesion 08/10/2013. Pathology confirmed metastatic adenocarcinoma. Extended K-ras testing returned negative for the K-ras mutation.  24. Constipation.  25. Panitumumab skin rash. Natalie Mccarthy continues minocycline and a moisturizer. Natalie Mccarthy is using a steroid cream. The rash over the face has improved. Natalie Mccarthy now has a painful scalp rash. 26. Insomnia, anxiety and restlessness following abrupt discontinuation of Percocet. Resolved after a slow Percocet taper.  27. Mucositis secondary to chemotherapy.  28. Acute nausea during the irinotecan infusion, lasting for approximately 24 hours. Improved with addition of Ativan to the premedication regimen.  29. Erythema over Port-A-Cath site following application of EMLA cream 11/01/2013-resolved     Disposition: Natalie Mccarthy appears stable. Plan to continue every 2 week Irinotecan/Panitumumab.   The skin rash overall is better, though Natalie Mccarthy has a progressive rash over the scalp related. Natalie Mccarthy will continue minocycline and topical steroid application. We can consider oral steroids if the rash continues to worsen.  We will see her in followup in 2 weeks. Natalie Mccarthy will contact the office in the interim with any problems.  Patient seen with Dr. Benay Spice.    Ned Card ANP/GNP-BC   04/18/2014  1:22 PM This was a shared visit with Ned Card. Natalie Mccarthy will try a topical steroid cream for the scalp rash. We will consult with the [facility] regarding other options.  The plan is to continue every 2 week irinotecan/panitumumab. I will present her case at the GI tumor conference to discuss the possibility of radioembolization therapy.  Julieanne Manson, M.D.

## 2014-04-18 NOTE — Patient Instructions (Signed)
[facility] Discharge Instructions for Patients Receiving Chemotherapy  Today you received the following chemotherapy agents: Vectibix, Irinotecan  To help prevent nausea and vomiting after your treatment, we encourage you to take your nausea medication as prescribed by your physician.    If you develop nausea and vomiting that is not controlled by your nausea medication, call the clinic.   BELOW ARE SYMPTOMS THAT SHOULD BE REPORTED IMMEDIATELY:  *FEVER GREATER THAN 100.5 F  *CHILLS WITH OR WITHOUT FEVER  NAUSEA AND VOMITING THAT IS NOT CONTROLLED WITH YOUR NAUSEA MEDICATION  *UNUSUAL SHORTNESS OF BREATH  *UNUSUAL BRUISING OR BLEEDING  TENDERNESS IN MOUTH AND THROAT WITH OR WITHOUT PRESENCE OF ULCERS  *URINARY PROBLEMS  *BOWEL PROBLEMS  UNUSUAL RASH Items with * indicate a potential emergency and should be followed up as soon as possible.  Feel free to call the clinic you have any questions or concerns. The clinic phone number is [phone].

## 2014-04-18 NOTE — Patient Instructions (Signed)

## 2014-04-18 NOTE — Progress Notes (Signed)
OK to treat with platelets 96 per Ned Card, NP

## 2014-04-19 ENCOUNTER — Telehealth: Payer: Self-pay | Admitting: *Deleted

## 2014-04-19 ENCOUNTER — Telehealth: Payer: Self-pay | Admitting: Oncology

## 2014-04-19 LAB — CEA: CEA: 24.3 ng/mL — AB (ref 0.0–5.0)

## 2014-04-19 NOTE — Telephone Encounter (Signed)
Per staff message and POF I have scheduled appts. Advised scheduler of appts. JMW  

## 2014-04-19 NOTE — Telephone Encounter (Signed)
, °

## 2014-04-21 ENCOUNTER — Telehealth: Payer: Self-pay | Admitting: *Deleted

## 2014-04-21 NOTE — Telephone Encounter (Signed)
Message copied by Norma Fredrickson on Fri Apr 21, 2014 11:05 AM ------      Message from: Tania Ade      Created: Fri Apr 21, 2014 10:02 AM                   ----- Message -----         From: Ladell Pier, MD         Sent: 04/19/2014   8:58 PM           To: Tania Ade, RN, Ludwig Lean, RN, #            Please call patient, cea is slightly higher,but no clinical or CT evidence of progression, continue current, add cea next treatment ------

## 2014-04-21 NOTE — Telephone Encounter (Signed)
Called and informed patient that cea is slightly higher, but no clinical or CT evidence of progression. Per Dr. Benay Spice.  Patient verbalized understanding.

## 2014-04-24 ENCOUNTER — Telehealth: Payer: Self-pay | Admitting: *Deleted

## 2014-04-24 NOTE — Telephone Encounter (Signed)
Pt called asking "what does "few shistocytes" mean from my blood work and is having large platelets OK; someone told me that if you have large platelets they are not working as well"  Per Dr. Benay Spice; explain to pt that the shistocytes were fragmented red cells on slide--nothing to worry about per MD.  Large platelets can mean young platelets and if anything they could work better--per MD nothing to worry about.  Pt verbalized understanding and expressed appreciation for call.

## 2014-04-30 ENCOUNTER — Other Ambulatory Visit: Payer: Self-pay | Admitting: Oncology

## 2014-05-01 ENCOUNTER — Ambulatory Visit (HOSPITAL_BASED_OUTPATIENT_CLINIC_OR_DEPARTMENT_OTHER): Payer: BC Managed Care – PPO | Admitting: Oncology

## 2014-05-01 ENCOUNTER — Telehealth: Payer: Self-pay | Admitting: Oncology

## 2014-05-01 ENCOUNTER — Other Ambulatory Visit (HOSPITAL_BASED_OUTPATIENT_CLINIC_OR_DEPARTMENT_OTHER): Payer: BC Managed Care – PPO

## 2014-05-01 ENCOUNTER — Telehealth: Payer: Self-pay | Admitting: *Deleted

## 2014-05-01 ENCOUNTER — Ambulatory Visit (HOSPITAL_BASED_OUTPATIENT_CLINIC_OR_DEPARTMENT_OTHER): Payer: BC Managed Care – PPO

## 2014-05-01 VITALS — BP 108/58 | HR 76 | Temp 97.9°F | Resp 19 | Ht 67.0 in | Wt 148.5 lb

## 2014-05-01 DIAGNOSIS — Z5111 Encounter for antineoplastic chemotherapy: Secondary | ICD-10-CM

## 2014-05-01 DIAGNOSIS — C184 Malignant neoplasm of transverse colon: Secondary | ICD-10-CM

## 2014-05-01 DIAGNOSIS — C787 Secondary malignant neoplasm of liver and intrahepatic bile duct: Secondary | ICD-10-CM

## 2014-05-01 DIAGNOSIS — C189 Malignant neoplasm of colon, unspecified: Secondary | ICD-10-CM

## 2014-05-01 DIAGNOSIS — L989 Disorder of the skin and subcutaneous tissue, unspecified: Secondary | ICD-10-CM

## 2014-05-01 DIAGNOSIS — G893 Neoplasm related pain (acute) (chronic): Secondary | ICD-10-CM

## 2014-05-01 DIAGNOSIS — D696 Thrombocytopenia, unspecified: Secondary | ICD-10-CM

## 2014-05-01 LAB — COMPREHENSIVE METABOLIC PANEL (CC13)
ALK PHOS: 342 U/L — AB (ref 40–150)
ALT: 31 U/L (ref 0–55)
AST: 39 U/L — AB (ref 5–34)
Albumin: 3.5 g/dL (ref 3.5–5.0)
Anion Gap: 9 mEq/L (ref 3–11)
BUN: 10 mg/dL (ref 7.0–26.0)
CO2: 28 mEq/L (ref 22–29)
CREATININE: 0.7 mg/dL (ref 0.6–1.1)
Calcium: 9.4 mg/dL (ref 8.4–10.4)
Chloride: 109 mEq/L (ref 98–109)
Glucose: 106 mg/dl (ref 70–140)
POTASSIUM: 4.2 meq/L (ref 3.5–5.1)
Sodium: 146 mEq/L — ABNORMAL HIGH (ref 136–145)
Total Bilirubin: 0.48 mg/dL (ref 0.20–1.20)
Total Protein: 6.5 g/dL (ref 6.4–8.3)

## 2014-05-01 LAB — CBC WITH DIFFERENTIAL/PLATELET
BASO%: 1 % (ref 0.0–2.0)
BASOS ABS: 0 10*3/uL (ref 0.0–0.1)
EOS%: 6 % (ref 0.0–7.0)
Eosinophils Absolute: 0.2 10*3/uL (ref 0.0–0.5)
HCT: 34.4 % — ABNORMAL LOW (ref 34.8–46.6)
HGB: 10.9 g/dL — ABNORMAL LOW (ref 11.6–15.9)
LYMPH%: 12.7 % — ABNORMAL LOW (ref 14.0–49.7)
MCH: 25.8 pg (ref 25.1–34.0)
MCHC: 31.8 g/dL (ref 31.5–36.0)
MCV: 81.3 fL (ref 79.5–101.0)
MONO#: 0.3 10*3/uL (ref 0.1–0.9)
MONO%: 10.1 % (ref 0.0–14.0)
NEUT#: 1.9 10*3/uL (ref 1.5–6.5)
NEUT%: 70.2 % (ref 38.4–76.8)
Platelets: 88 10*3/uL — ABNORMAL LOW (ref 145–400)
RBC: 4.23 10*6/uL (ref 3.70–5.45)
RDW: 21.1 % — AB (ref 11.2–14.5)
WBC: 2.7 10*3/uL — ABNORMAL LOW (ref 3.9–10.3)
lymph#: 0.3 10*3/uL — ABNORMAL LOW (ref 0.9–3.3)

## 2014-05-01 LAB — MAGNESIUM (CC13): MAGNESIUM: 1.6 mg/dL (ref 1.5–2.5)

## 2014-05-01 MED ORDER — LORAZEPAM 2 MG/ML IJ SOLN
INTRAMUSCULAR | Status: AC
  Filled 2014-05-01: qty 1

## 2014-05-01 MED ORDER — PALONOSETRON HCL INJECTION 0.25 MG/5ML
INTRAVENOUS | Status: AC
  Filled 2014-05-01: qty 5

## 2014-05-01 MED ORDER — DEXAMETHASONE SODIUM PHOSPHATE 10 MG/ML IJ SOLN
INTRAMUSCULAR | Status: AC
  Filled 2014-05-01: qty 1

## 2014-05-01 MED ORDER — SODIUM CHLORIDE 0.9 % IJ SOLN
10.0000 mL | INTRAMUSCULAR | Status: DC | PRN
  Administered 2014-05-01: 10 mL
  Filled 2014-05-01: qty 10

## 2014-05-01 MED ORDER — DEXAMETHASONE SODIUM PHOSPHATE 10 MG/ML IJ SOLN
10.0000 mg | Freq: Once | INTRAMUSCULAR | Status: AC
  Administered 2014-05-01: 10 mg via INTRAVENOUS

## 2014-05-01 MED ORDER — SODIUM CHLORIDE 0.9 % IV SOLN
5.8000 mg/kg | Freq: Once | INTRAVENOUS | Status: AC
  Administered 2014-05-01: 400 mg via INTRAVENOUS
  Filled 2014-05-01: qty 20

## 2014-05-01 MED ORDER — SODIUM CHLORIDE 0.9 % IV SOLN
Freq: Once | INTRAVENOUS | Status: AC
  Administered 2014-05-01: 13:00:00 via INTRAVENOUS

## 2014-05-01 MED ORDER — LORAZEPAM 2 MG/ML IJ SOLN
0.5000 mg | Freq: Once | INTRAMUSCULAR | Status: AC
  Administered 2014-05-01: 0.5 mg via INTRAVENOUS

## 2014-05-01 MED ORDER — PALONOSETRON HCL INJECTION 0.25 MG/5ML
0.2500 mg | Freq: Once | INTRAVENOUS | Status: AC
Start: 2014-05-01 — End: 2014-05-01
  Administered 2014-05-01: 0.25 mg via INTRAVENOUS

## 2014-05-01 MED ORDER — HEPARIN SOD (PORK) LOCK FLUSH 100 UNIT/ML IV SOLN
500.0000 [IU] | Freq: Once | INTRAVENOUS | Status: AC | PRN
  Administered 2014-05-01: 500 [IU]
  Filled 2014-05-01: qty 5

## 2014-05-01 MED ORDER — IRINOTECAN HCL CHEMO INJECTION 100 MG/5ML
99.0000 mg/m2 | Freq: Once | INTRAVENOUS | Status: AC
  Administered 2014-05-01: 180 mg via INTRAVENOUS
  Filled 2014-05-01: qty 9

## 2014-05-01 NOTE — Telephone Encounter (Signed)
Gave pt appt for lab and Md emailed [facility] for July 2015

## 2014-05-01 NOTE — Telephone Encounter (Signed)
Per POF scheduled appt. 

## 2014-05-01 NOTE — Progress Notes (Signed)
Natalie Mccarthy OFFICE PROGRESS NOTE   Diagnosis: Colon cancer  INTERVAL HISTORY:   She returns as scheduled. She completed another treatment with irinotecan/panitumumab on 04/18/2014. Mild nausea following chemotherapy. The face and chest rash has improved. She continues to have painful/pruritic skin lesions over the scalp. The topical steroid cream has not helped.  Objective:  Vital signs in last 24 hours:  Blood pressure 108/58, pulse 76, temperature 97.9 F (36.6 C), temperature source Oral, resp. rate 19, height $RemoveBe'5\' 7"'lRrKbKSxQ$  (1.702 m), weight 148 lb 8 oz (67.359 kg).    HEENT: No thrush or ulcers Resp: Lungs clear bilaterally Cardio: Regular rate and rhythm GI: No hepatomegaly, nontender Vascular: No leg edema  Skin: Multiple scabbed/ulcerated lesions over the scalp. Diffuse erythematous acne type lesions over the face and trunk   Portacath/PICC-without erythema  Lab Results:  Lab Results  Component Value Date   WBC 2.7* 05/01/2014   HGB 10.9* 05/01/2014   HCT 34.4* 05/01/2014   MCV 81.3 05/01/2014   PLT 88* 05/01/2014   NEUTROABS 1.9 05/01/2014    Lab Results  Component Value Date   CEA 24.3* 04/18/2014    Imaging:  No results found.  Medications: I have reviewed the patient's current medications.  Assessment/Plan: 1.Metastatic colon cancer with multiple liver metastases noted on an MRI of the abdomen 12/02/2010 and on a CT 12/13/2010. A transverse colon mass was noted on the CT from 02/11/2011 and confirmed on a colonoscopy 12/17/2010. She began treatment with FOLFIRI/Avastin 12/31/2010. A restaging CT 06/20/2011 confirmed further improvement in the metastatic liver lesions and no evidence for progressive metastatic disease. A restaging CT 08/01/2011 showed stable liver lesions and no new lesions. Restaging CT 09/12/2011 showed stable liver lesions and no new lesions. A restaging CT 10/27/2011 showed stable liver lesions and no new lesions. Restaging CT  12/05/2011 showed mild decrease in size of hepatic metastases and no evidence of disease progression. Restaging CT 01/26/2012 showed stable hepatic metastases and no new lesions. Restaging CT may 5/8/ 2013 revealed a decreased size of hepatic metastases and no new lesions. Restaging CT 04/23/2012 revealed no change in the measurable "target" lesions, a slight increase in the size of other lesions, and no new lesions. Restaging CT 06/04/2012 showed multiple hepatic metastases were grossly unchanged. Restaging CT 07/19/2012 with stable disease in the liver. Infusional 5-fluorouracil continued on a 2 week schedule. Treatment was held after the 08/03/2012 cycle due to thrombocytopenia. Restaging CT evaluation 08/30/2012 with mild increase in size of large hepatic metastases. Treatment was resumed with 5 fluorouracil and Avastin on 08/31/2012. Restaging CT 10/14/2012-stable hepatic metastases. Treatment continued with 5 fluorouracil and Avastin. Restaging CT 12/01/2012 revealed mild progression in the size of multiple hepatic lesions. Treatment was placed on hold. CEA 32.1 on 01/06/2013 and 71.9 on 03/11/2013. Restaging CT abdomen/pelvis 03/31/2013 with interval progression of liver metastases. She began cycle 1 CAPOX on 04/15/2013. The Xeloda was prematurely discontinued on 04/25/2013 due to redness and pain over the feet. She completed cycle 2 beginning 05/24/2013 and cycle 3 beginning on 07/05/2013. Disease progression presenting with severe abdominal pain. She began irinotecan/Panitumumab 09/05/2013. CEA markedly improved at 5.0 on 11/15/2013.  Restaging CT 11/25/2013 with improvement in hepatic metastases and abdominal lymphadenopathy.  Irinotecan/Panitumumab continued.  Restaging CT 03/31/2014 with stable to slight improvement in liver metastases.  Irinotecan/Panitumumab continued. 2. Abdominal pain-? Secondary to liver metastases, metastatic adenopathy, or the primary colon tumor. Less likely peptic ulcer  disease. Improved.  3. Anorexia, early satiety, and weight loss  secondary to metastatic colon cancer-resolved.  4. Microcytic anemia. Resolved.  5. History of migraine headaches.  6. Status post uterine fibroid surgery.  7. Port-A-Cath placement 12/20/2010.  8. Delayed nausea following cycle 1 of FOLFIRI/Avastin. Improved with the addition of Aloxi and prophylactic Decadron beginning with cycle #2.  9. Neutropenia secondary to chemotherapy. Cycle #4 was held on 02/11/2011. She subsequently received Neulasta support.  10. History of intermittent fever with no apparent source for infection, likely "tumor fever" or fever related to chemotherapy. No recent fever.  11. History of mucositis secondary to 5-FU, initially grade 2. Irinotecan and 5-FU were dose reduced per study guidelines.  12. History of abdominal cramping. ? Related to irinotecan, 5-FU, or potentially the colon tumor. No recent abdominal cramping.  13. Thrombocytopenia secondary to chemotherapy (while being treated with irinotecan and Avastin). Treatment was held for thrombocytopenia per study guidelines. The thrombocytopenia may be related to Avastin, or chronic liver disease from chemotherapy. The CT on 04/23/2012 confirmed mild splenomegaly. The CT on 06/04/2012 again showed splenomegaly. The thrombocytopenia persisted despite being off of irinotecan and Avastin for several months. The thrombocytopenia improved. She developed thrombocytopenia following cycle 1 and cycle 2 CAPOX. She again has thrombocytopenia. Likely related to chronic liver disease and splenomegaly the  15. Arthralgias-most likely unrelated to the colon cancer diagnosis and chemotherapy. She has been evaluated by orthopedics. The etiology of the arthralgias is unclear.  16. Persistent discomfort at the right shoulder. Negative CT of the right upper extremity on 07/19/2012. MRI on 08/06/2012 with a partial thickness bursal surface tear of the supraspinatus tendon, moderate  rotator cuff tendinopathy/tendinosis. She is now followed by orthopedics. The pain has resolved. The range of motion at the right shoulder has improved  17.? Swelling at the right upper extremity and chest wall April 2014-negative right upper extremity Doppler.  18. Delayed nausea following cycle 1 CAPOX. Emend and Aloxi were added with cycle 2.  19. Mucositis and hand-foot syndrome secondary to Xeloda. The Xeloda was dose reduced to 1000 mg twice daily for 14 days with cycle 2.  20. Thrombocytopenia following cycle 1 CAPOX. Cycle 2 and cycle 3 were delayed due to thrombocytopenia. The oxaliplatin was further dose reduced with cycle 3.  21.New onset pruritus 04/04/2013-hyperbilirubinemia and an elevated alkaline phosphatase noted on labs 04/06/2013, the CT 03/31/2013 revealed peripheral biliary ductal dilatation in the medial segment of the left hepatic lobe,no options for intervention after evaluation by Dr. Watt Climes. The hyperbilirubinemia initially resolved and was mildly elevated on 07/26/2013.  22. Acute severe upper abdominal pain, pleuritic, with radiation to the chest/shoulders beginning 07/25/2013-etiology unclear. Chest CT 07/25/2013 showed no evidence of an acute pulmonary embolus and no acute or metastatic findings in the chest. CT abdomen/pelvis on 07/26/2013 showed interval slight progression of metastatic disease to the liver; no new hepatic metastases; slight progression of intrahepatic biliary ductal dilatation; interval marked increase in the size of the spleen; interval increase in the size of a portacaval lymph node and gastrohepatic ligament lymph nodes; contracted thickwalled gallbladder possibly containing sludge. The pain is likely related to metastatic disease involving the liver with possible irritation of the diaphragm. The pain has resolved.  23. Status post biopsy of a liver lesion 08/10/2013. Pathology confirmed metastatic adenocarcinoma. Extended K-ras testing returned negative for  the K-ras mutation.  24. Constipation.  25. Panitumumab skin rash. She continues minocycline and a moisturizer. She is using a steroid cream. The rash over the face has improved. She now has a painful scalp  rash.  26. Insomnia, anxiety and restlessness following abrupt discontinuation of Percocet. Resolved after a slow Percocet taper.  27. History of Mucositis secondary to chemotherapy.  28. Acute nausea during the irinotecan infusion, lasting for approximately 24 hours. Improved with addition of Ativan to the premedication regimen.  29. Erythema over Port-A-Cath site following application of EMLA cream 11/01/2013-resolved    Disposition:  Her overall status appears unchanged. The plan is to continue irinotecan/panitumumab on a 2 week schedule. We will followup on the CEA from today. If there is a consistent rise in the CEA we will plan for a restaging CT evaluation. If disease progression is confirmed and disease remains limited to the liver she will be a candidate for radioembolization. I will plan for concurrent infusional 5-fluorouracil if she is treated with radioembolization.  The adapalene has helped the face rash. She will try this on the scalp.  Betsy Coder, MD  05/01/2014  12:07 PM

## 2014-05-01 NOTE — Patient Instructions (Signed)
[facility] Discharge Instructions for Patients Receiving Chemotherapy  Today you received the following chemotherapy agents: Vectibix, Irinotecan  To help prevent nausea and vomiting after your treatment, we encourage you to take your nausea medication as prescribed by your physician.    If you develop nausea and vomiting that is not controlled by your nausea medication, call the clinic.   BELOW ARE SYMPTOMS THAT SHOULD BE REPORTED IMMEDIATELY:  *FEVER GREATER THAN 100.5 F  *CHILLS WITH OR WITHOUT FEVER  NAUSEA AND VOMITING THAT IS NOT CONTROLLED WITH YOUR NAUSEA MEDICATION  *UNUSUAL SHORTNESS OF BREATH  *UNUSUAL BRUISING OR BLEEDING  TENDERNESS IN MOUTH AND THROAT WITH OR WITHOUT PRESENCE OF ULCERS  *URINARY PROBLEMS  *BOWEL PROBLEMS  UNUSUAL RASH Items with * indicate a potential emergency and should be followed up as soon as possible.  Feel free to call the clinic you have any questions or concerns. The clinic phone number is [phone].

## 2014-05-01 NOTE — Progress Notes (Signed)
Per Dr. Benay Spice, okay to tx with platelets-88

## 2014-05-02 ENCOUNTER — Ambulatory Visit: Payer: BC Managed Care – PPO | Admitting: Oncology

## 2014-05-02 ENCOUNTER — Ambulatory Visit: Payer: BC Managed Care – PPO

## 2014-05-02 ENCOUNTER — Other Ambulatory Visit: Payer: BC Managed Care – PPO

## 2014-05-02 ENCOUNTER — Telehealth: Payer: Self-pay | Admitting: *Deleted

## 2014-05-02 LAB — CEA: CEA: 26.5 ng/mL — ABNORMAL HIGH (ref 0.0–5.0)

## 2014-05-02 NOTE — Telephone Encounter (Signed)
Left VM that MD talked with pharmacy and OK to try using the Adapalene-Benzoyl Peroxide gel on her scalp also.

## 2014-05-04 ENCOUNTER — Telehealth: Payer: Self-pay | Admitting: Oncology

## 2014-05-04 NOTE — Telephone Encounter (Signed)
Talked to pt ,lab,md and chemo for July 2015

## 2014-05-08 ENCOUNTER — Telehealth: Payer: Self-pay | Admitting: *Deleted

## 2014-05-08 DIAGNOSIS — C189 Malignant neoplasm of colon, unspecified: Secondary | ICD-10-CM

## 2014-05-08 NOTE — Telephone Encounter (Signed)
Message from pt reporting she used Epiduo on scalp for rash and it seems to be worsening. Asking if she should see dermatologist prior to next office visit here or if Dr. Benay Spice wants to see her first before making referral? Reviewed with Dr. Benay Spice: Order received for antibiotic lotion for scalp.  Pt's local pharmacy unable to compound antibiotic lotion. [facility], components can be ordered, will be "very expensive." Per Dr. Benay Spice: make referral to dermatology to evaluate, treat scalp rash. Pt made aware. She voiced understanding.  Left message on referral line at [facility] requesting new pt appt. Office note faxed.

## 2014-05-10 ENCOUNTER — Telehealth: Payer: Self-pay | Admitting: Oncology

## 2014-05-10 NOTE — Telephone Encounter (Signed)
cld & spoke to pt to give time & date for appt @ [facility]-pt understood.

## 2014-05-14 ENCOUNTER — Other Ambulatory Visit: Payer: Self-pay | Admitting: Oncology

## 2014-05-16 ENCOUNTER — Ambulatory Visit (HOSPITAL_BASED_OUTPATIENT_CLINIC_OR_DEPARTMENT_OTHER): Payer: BC Managed Care – PPO | Admitting: Nurse Practitioner

## 2014-05-16 ENCOUNTER — Ambulatory Visit (HOSPITAL_BASED_OUTPATIENT_CLINIC_OR_DEPARTMENT_OTHER): Payer: BC Managed Care – PPO

## 2014-05-16 ENCOUNTER — Other Ambulatory Visit (HOSPITAL_BASED_OUTPATIENT_CLINIC_OR_DEPARTMENT_OTHER): Payer: BC Managed Care – PPO

## 2014-05-16 ENCOUNTER — Telehealth: Payer: Self-pay | Admitting: Oncology

## 2014-05-16 VITALS — BP 128/77 | HR 79 | Temp 97.7°F | Resp 18 | Ht 67.0 in | Wt 146.8 lb

## 2014-05-16 DIAGNOSIS — C189 Malignant neoplasm of colon, unspecified: Secondary | ICD-10-CM

## 2014-05-16 DIAGNOSIS — R21 Rash and other nonspecific skin eruption: Secondary | ICD-10-CM

## 2014-05-16 DIAGNOSIS — C184 Malignant neoplasm of transverse colon: Secondary | ICD-10-CM

## 2014-05-16 DIAGNOSIS — C787 Secondary malignant neoplasm of liver and intrahepatic bile duct: Secondary | ICD-10-CM

## 2014-05-16 DIAGNOSIS — K5900 Constipation, unspecified: Secondary | ICD-10-CM

## 2014-05-16 DIAGNOSIS — R109 Unspecified abdominal pain: Secondary | ICD-10-CM

## 2014-05-16 DIAGNOSIS — M2550 Pain in unspecified joint: Secondary | ICD-10-CM

## 2014-05-16 DIAGNOSIS — Z5111 Encounter for antineoplastic chemotherapy: Secondary | ICD-10-CM

## 2014-05-16 LAB — CBC WITH DIFFERENTIAL/PLATELET
BASO%: 0.8 % (ref 0.0–2.0)
BASOS ABS: 0 10*3/uL (ref 0.0–0.1)
EOS%: 5.2 % (ref 0.0–7.0)
Eosinophils Absolute: 0.2 10*3/uL (ref 0.0–0.5)
HCT: 35.8 % (ref 34.8–46.6)
HEMOGLOBIN: 11.4 g/dL — AB (ref 11.6–15.9)
LYMPH%: 10.9 % — ABNORMAL LOW (ref 14.0–49.7)
MCH: 26 pg (ref 25.1–34.0)
MCHC: 31.8 g/dL (ref 31.5–36.0)
MCV: 81.5 fL (ref 79.5–101.0)
MONO#: 0.3 10*3/uL (ref 0.1–0.9)
MONO%: 8.4 % (ref 0.0–14.0)
NEUT#: 2.7 10*3/uL (ref 1.5–6.5)
NEUT%: 74.7 % (ref 38.4–76.8)
Platelets: 88 10*3/uL — ABNORMAL LOW (ref 145–400)
RBC: 4.39 10*6/uL (ref 3.70–5.45)
RDW: 19.3 % — AB (ref 11.2–14.5)
WBC: 3.7 10*3/uL — ABNORMAL LOW (ref 3.9–10.3)
lymph#: 0.4 10*3/uL — ABNORMAL LOW (ref 0.9–3.3)
nRBC: 0 % (ref 0–0)

## 2014-05-16 LAB — COMPREHENSIVE METABOLIC PANEL (CC13)
ALK PHOS: 307 U/L — AB (ref 40–150)
ALT: 22 U/L (ref 0–55)
AST: 34 U/L (ref 5–34)
Albumin: 3.5 g/dL (ref 3.5–5.0)
Anion Gap: 8 mEq/L (ref 3–11)
BILIRUBIN TOTAL: 0.82 mg/dL (ref 0.20–1.20)
BUN: 10.9 mg/dL (ref 7.0–26.0)
CO2: 29 mEq/L (ref 22–29)
Calcium: 9.5 mg/dL (ref 8.4–10.4)
Chloride: 108 mEq/L (ref 98–109)
Creatinine: 0.7 mg/dL (ref 0.6–1.1)
GLUCOSE: 134 mg/dL (ref 70–140)
Potassium: 4.1 mEq/L (ref 3.5–5.1)
SODIUM: 144 meq/L (ref 136–145)
TOTAL PROTEIN: 6.7 g/dL (ref 6.4–8.3)

## 2014-05-16 LAB — MAGNESIUM (CC13): MAGNESIUM: 1.4 mg/dL — AB (ref 1.5–2.5)

## 2014-05-16 LAB — CEA: CEA: 38.8 ng/mL — AB (ref 0.0–5.0)

## 2014-05-16 MED ORDER — SODIUM CHLORIDE 0.9 % IV SOLN
Freq: Once | INTRAVENOUS | Status: AC
  Administered 2014-05-16: 11:00:00 via INTRAVENOUS

## 2014-05-16 MED ORDER — LORAZEPAM 2 MG/ML IJ SOLN
INTRAMUSCULAR | Status: AC
  Filled 2014-05-16: qty 1

## 2014-05-16 MED ORDER — LORAZEPAM 2 MG/ML IJ SOLN
0.5000 mg | Freq: Once | INTRAMUSCULAR | Status: AC
  Administered 2014-05-16: 0.5 mg via INTRAVENOUS

## 2014-05-16 MED ORDER — PREDNISONE 20 MG PO TABS: ORAL_TABLET | ORAL | Status: DC

## 2014-05-16 MED ORDER — PALONOSETRON HCL INJECTION 0.25 MG/5ML
INTRAVENOUS | Status: AC
  Filled 2014-05-16: qty 5

## 2014-05-16 MED ORDER — DEXTROSE 5 % IV SOLN
100.0000 mg/m2 | Freq: Once | INTRAVENOUS | Status: AC
  Administered 2014-05-16: 180 mg via INTRAVENOUS
  Filled 2014-05-16: qty 9

## 2014-05-16 MED ORDER — HEPARIN SOD (PORK) LOCK FLUSH 100 UNIT/ML IV SOLN
500.0000 [IU] | Freq: Once | INTRAVENOUS | Status: DC | PRN
  Filled 2014-05-16: qty 5

## 2014-05-16 MED ORDER — DEXAMETHASONE SODIUM PHOSPHATE 10 MG/ML IJ SOLN
INTRAMUSCULAR | Status: AC
  Filled 2014-05-16: qty 1

## 2014-05-16 MED ORDER — PALONOSETRON HCL INJECTION 0.25 MG/5ML
0.2500 mg | Freq: Once | INTRAVENOUS | Status: AC
  Administered 2014-05-16: 0.25 mg via INTRAVENOUS

## 2014-05-16 MED ORDER — SODIUM CHLORIDE 0.9 % IJ SOLN
10.0000 mL | INTRAMUSCULAR | Status: DC | PRN
  Filled 2014-05-16: qty 10

## 2014-05-16 MED ORDER — DEXAMETHASONE SODIUM PHOSPHATE 10 MG/ML IJ SOLN
10.0000 mg | Freq: Once | INTRAMUSCULAR | Status: AC
  Administered 2014-05-16: 10 mg via INTRAVENOUS

## 2014-05-16 NOTE — Patient Instructions (Signed)
[facility] Discharge Instructions for Patients Receiving Chemotherapy  Today you received the following chemotherapy agents: Irinotecan.  To help prevent nausea and vomiting after your treatment, we encourage you to take your nausea medication.   If you develop nausea and vomiting that is not controlled by your nausea medication, call the clinic.   BELOW ARE SYMPTOMS THAT SHOULD BE REPORTED IMMEDIATELY:  *FEVER GREATER THAN 100.5 F  *CHILLS WITH OR WITHOUT FEVER  NAUSEA AND VOMITING THAT IS NOT CONTROLLED WITH YOUR NAUSEA MEDICATION  *UNUSUAL SHORTNESS OF BREATH  *UNUSUAL BRUISING OR BLEEDING  TENDERNESS IN MOUTH AND THROAT WITH OR WITHOUT PRESENCE OF ULCERS  *URINARY PROBLEMS  *BOWEL PROBLEMS  UNUSUAL RASH Items with * indicate a potential emergency and should be followed up as soon as possible.  Feel free to call the clinic you have any questions or concerns. The clinic phone number is [phone].

## 2014-05-16 NOTE — Telephone Encounter (Signed)
gv pt appt schedule for july/aug °

## 2014-05-16 NOTE — Progress Notes (Addendum)
[facility] OFFICE PROGRESS NOTE   Diagnosis:  Colon cancer.  INTERVAL HISTORY:   Natalie Mccarthy returns as scheduled. She was last treated with irinotecan/Panitumumab on 05/01/2014. She has had intermittent nausea for the past few days. No vomiting. No diarrhea. She has a persistent small sore at the lower inner lip. The scalp lesions have progressed and continue to be "itchy and painful". There is some associated bleeding. She has developed a rash around her eyes since the last visit. She saw the dermatologist yesterday and was prescribed 2 new creams  Objective:  Vital signs in last 24 hours:  Blood pressure 128/77, pulse 79, temperature 97.7 F (36.5 C), temperature source Oral, resp. rate 18, height $RemoveBe'5\' 7"'RWOmKdern$  (1.702 m), weight 146 lb 12.8 oz (66.588 kg), SpO2 100.00%.    HEENT: Small ulcer at the lower inner gumline. Resp: Lungs clear. Cardio: Regular cardiac rhythm. GI: Abdomen soft and nontender. No hepatomegaly. Vascular: No leg edema.  Skin: Multiple large scab/ulcerated lesions scattered over the scalp. Slightly raised erythematous lesions at the outer corner of both eyes.   Port-A-Cath site is without erythema.   Lab Results:  Lab Results  Component Value Date   WBC 3.7* 05/16/2014   HGB 11.4* 05/16/2014   HCT 35.8 05/16/2014   MCV 81.5 05/16/2014   PLT 88* 05/16/2014   NEUTROABS 2.7 05/16/2014    Imaging:  No results found.  Medications: I have reviewed the patient's current medications.  Assessment/Plan: 1.Metastatic colon cancer with multiple liver metastases noted on an MRI of the abdomen 12/02/2010 and on a CT 12/13/2010. A transverse colon mass was noted on the CT from 02/11/2011 and confirmed on a colonoscopy 12/17/2010. She began treatment with FOLFIRI/Avastin 12/31/2010. A restaging CT 06/20/2011 confirmed further improvement in the metastatic liver lesions and no evidence for progressive metastatic disease. A restaging CT 08/01/2011 showed stable  liver lesions and no new lesions. Restaging CT 09/12/2011 showed stable liver lesions and no new lesions. A restaging CT 10/27/2011 showed stable liver lesions and no new lesions. Restaging CT 12/05/2011 showed mild decrease in size of hepatic metastases and no evidence of disease progression. Restaging CT 01/26/2012 showed stable hepatic metastases and no new lesions. Restaging CT may 5/8/ 2013 revealed a decreased size of hepatic metastases and no new lesions. Restaging CT 04/23/2012 revealed no change in the measurable "target" lesions, a slight increase in the size of other lesions, and no new lesions. Restaging CT 06/04/2012 showed multiple hepatic metastases were grossly unchanged. Restaging CT 07/19/2012 with stable disease in the liver. Infusional 5-fluorouracil continued on a 2 week schedule. Treatment was held after the 08/03/2012 cycle due to thrombocytopenia. Restaging CT evaluation 08/30/2012 with mild increase in size of large hepatic metastases. Treatment was resumed with 5 fluorouracil and Avastin on 08/31/2012. Restaging CT 10/14/2012-stable hepatic metastases. Treatment continued with 5 fluorouracil and Avastin. Restaging CT 12/01/2012 revealed mild progression in the size of multiple hepatic lesions. Treatment was placed on hold. CEA 32.1 on 01/06/2013 and 71.9 on 03/11/2013. Restaging CT abdomen/pelvis 03/31/2013 with interval progression of liver metastases. She began cycle 1 CAPOX on 04/15/2013. The Xeloda was prematurely discontinued on 04/25/2013 due to redness and pain over the feet. She completed cycle 2 beginning 05/24/2013 and cycle 3 beginning on 07/05/2013. Disease progression presenting with severe abdominal pain. She began irinotecan/Panitumumab 09/05/2013. CEA markedly improved at 5.0 on 11/15/2013.  Restaging CT 11/25/2013 with improvement in hepatic metastases and abdominal lymphadenopathy.  Irinotecan/Panitumumab continued.  Restaging CT 03/31/2014 with stable  to slight  improvement in liver metastases.  Irinotecan/Panitumumab continued. 2. Abdominal pain-? Secondary to liver metastases, metastatic adenopathy, or the primary colon tumor. Less likely peptic ulcer disease. Improved.  3. Anorexia, early satiety, and weight loss secondary to metastatic colon cancer-resolved.  4. Microcytic anemia. Resolved.  5. History of migraine headaches.  6. Status post uterine fibroid surgery.  7. Port-A-Cath placement 12/20/2010.  8. Delayed nausea following cycle 1 of FOLFIRI/Avastin. Improved with the addition of Aloxi and prophylactic Decadron beginning with cycle #2.  9. Neutropenia secondary to chemotherapy. Cycle #4 was held on 02/11/2011. She subsequently received Neulasta support.  10. History of intermittent fever with no apparent source for infection, likely "tumor fever" or fever related to chemotherapy. No recent fever.  11. History of mucositis secondary to 5-FU, initially grade 2. Irinotecan and 5-FU were dose reduced per study guidelines.  12. History of abdominal cramping. ? Related to irinotecan, 5-FU, or potentially the colon tumor. No recent abdominal cramping.  13. Thrombocytopenia secondary to chemotherapy (while being treated with irinotecan and Avastin). Treatment was held for thrombocytopenia per study guidelines. The thrombocytopenia may be related to Avastin, or chronic liver disease from chemotherapy. The CT on 04/23/2012 confirmed mild splenomegaly. The CT on 06/04/2012 again showed splenomegaly. The thrombocytopenia persisted despite being off of irinotecan and Avastin for several months. The thrombocytopenia improved. She developed thrombocytopenia following cycle 1 and cycle 2 CAPOX. She again has thrombocytopenia. Likely related to chronic liver disease and splenomegaly.  69. Arthralgias-most likely unrelated to the colon cancer diagnosis and chemotherapy. She has been evaluated by orthopedics. The etiology of the arthralgias is unclear.  16.  Persistent discomfort at the right shoulder. Negative CT of the right upper extremity on 07/19/2012. MRI on 08/06/2012 with a partial thickness bursal surface tear of the supraspinatus tendon, moderate rotator cuff tendinopathy/tendinosis. She is now followed by orthopedics. The pain has resolved. The range of motion at the right shoulder has improved  17.? Swelling at the right upper extremity and chest wall April 2014-negative right upper extremity Doppler.  18. Delayed nausea following cycle 1 CAPOX. Emend and Aloxi were added with cycle 2.  19. Mucositis and hand-foot syndrome secondary to Xeloda. The Xeloda was dose reduced to 1000 mg twice daily for 14 days with cycle 2.  20. Thrombocytopenia following cycle 1 CAPOX. Cycle 2 and cycle 3 were delayed due to thrombocytopenia. The oxaliplatin was further dose reduced with cycle 3.  21.New onset pruritus 04/04/2013-hyperbilirubinemia and an elevated alkaline phosphatase noted on labs 04/06/2013, the CT 03/31/2013 revealed peripheral biliary ductal dilatation in the medial segment of the left hepatic lobe,no options for intervention after evaluation by Dr. Watt Climes. The hyperbilirubinemia initially resolved and was mildly elevated on 07/26/2013.  22. Acute severe upper abdominal pain, pleuritic, with radiation to the chest/shoulders beginning 07/25/2013-etiology unclear. Chest CT 07/25/2013 showed no evidence of an acute pulmonary embolus and no acute or metastatic findings in the chest. CT abdomen/pelvis on 07/26/2013 showed interval slight progression of metastatic disease to the liver; no new hepatic metastases; slight progression of intrahepatic biliary ductal dilatation; interval marked increase in the size of the spleen; interval increase in the size of a portacaval lymph node and gastrohepatic ligament lymph nodes; contracted thickwalled gallbladder possibly containing sludge. The pain is likely related to metastatic disease involving the liver with  possible irritation of the diaphragm. The pain has resolved.  23. Status post biopsy of a liver lesion 08/10/2013. Pathology confirmed metastatic adenocarcinoma. Extended K-ras testing returned negative  for the K-ras mutation.  24. Constipation.  25. Panitumumab skin rash. She continues minocycline and a moisturizer. She is using a steroid cream. The rash over the face has improved. She now has a painful scalp rash. 26. Insomnia, anxiety and restlessness following abrupt discontinuation of Percocet. Resolved after a slow Percocet taper.  27. History of mucositis secondary to chemotherapy.  28. Acute nausea during the irinotecan infusion, lasting for approximately 24 hours. Improved with addition of Ativan to the premedication regimen.  29. Erythema over Port-A-Cath site following application of EMLA cream 11/01/2013-resolved    Disposition: The scalp rash has progressed. We are holding the Panitumumab today and proceeding with irinotecan alone. She will begin a prednisone taper (60 mg daily for 4 days, 40 mg daily for 4 days, then 20 mg daily until she returns). She will continue with the dermatologists recommendations as well.  We will see her in followup in 2 weeks. She will call the office in the interim with any problems.  Patient seen with Dr. Benay Spice.  Ned Card ANP/GNP-BC   05/16/2014  10:56 AM  This was a shared visit with Ned Card. She has a persistent severe scalp rash despite minocycline and topical steroid creams. We decided to begin systemic steroids. Panitumumab has been placed on hold.  Julieanne Manson, M.D.

## 2014-05-19 ENCOUNTER — Encounter: Payer: Self-pay | Admitting: *Deleted

## 2014-05-19 NOTE — Progress Notes (Signed)
Office note from dermatology consult to MD desk. Updated med list per note.

## 2014-05-28 ENCOUNTER — Other Ambulatory Visit: Payer: Self-pay | Admitting: Oncology

## 2014-05-30 ENCOUNTER — Telehealth: Payer: Self-pay | Admitting: Oncology

## 2014-05-30 ENCOUNTER — Ambulatory Visit (HOSPITAL_BASED_OUTPATIENT_CLINIC_OR_DEPARTMENT_OTHER): Payer: BC Managed Care – PPO | Admitting: Oncology

## 2014-05-30 ENCOUNTER — Telehealth: Payer: Self-pay | Admitting: *Deleted

## 2014-05-30 ENCOUNTER — Ambulatory Visit (HOSPITAL_BASED_OUTPATIENT_CLINIC_OR_DEPARTMENT_OTHER): Payer: BC Managed Care – PPO

## 2014-05-30 ENCOUNTER — Other Ambulatory Visit (HOSPITAL_BASED_OUTPATIENT_CLINIC_OR_DEPARTMENT_OTHER): Payer: BC Managed Care – PPO

## 2014-05-30 VITALS — BP 119/62 | HR 77 | Temp 98.4°F | Resp 18 | Ht 67.0 in | Wt 148.5 lb

## 2014-05-30 DIAGNOSIS — C184 Malignant neoplasm of transverse colon: Secondary | ICD-10-CM

## 2014-05-30 DIAGNOSIS — C189 Malignant neoplasm of colon, unspecified: Secondary | ICD-10-CM

## 2014-05-30 DIAGNOSIS — C787 Secondary malignant neoplasm of liver and intrahepatic bile duct: Secondary | ICD-10-CM

## 2014-05-30 DIAGNOSIS — Z95828 Presence of other vascular implants and grafts: Secondary | ICD-10-CM

## 2014-05-30 DIAGNOSIS — Z5111 Encounter for antineoplastic chemotherapy: Secondary | ICD-10-CM

## 2014-05-30 DIAGNOSIS — R21 Rash and other nonspecific skin eruption: Secondary | ICD-10-CM

## 2014-05-30 DIAGNOSIS — R109 Unspecified abdominal pain: Secondary | ICD-10-CM

## 2014-05-30 LAB — COMPREHENSIVE METABOLIC PANEL (CC13)
ALT: 31 U/L (ref 0–55)
ANION GAP: 8 meq/L (ref 3–11)
AST: 30 U/L (ref 5–34)
Albumin: 3.3 g/dL — ABNORMAL LOW (ref 3.5–5.0)
Alkaline Phosphatase: 222 U/L — ABNORMAL HIGH (ref 40–150)
BILIRUBIN TOTAL: 0.47 mg/dL (ref 0.20–1.20)
BUN: 10.1 mg/dL (ref 7.0–26.0)
CALCIUM: 8.9 mg/dL (ref 8.4–10.4)
CHLORIDE: 107 meq/L (ref 98–109)
CO2: 28 mEq/L (ref 22–29)
Creatinine: 0.6 mg/dL (ref 0.6–1.1)
Glucose: 119 mg/dl (ref 70–140)
Potassium: 3.6 mEq/L (ref 3.5–5.1)
Sodium: 142 mEq/L (ref 136–145)
Total Protein: 6.2 g/dL — ABNORMAL LOW (ref 6.4–8.3)

## 2014-05-30 LAB — CBC WITH DIFFERENTIAL/PLATELET
BASO%: 1 % (ref 0.0–2.0)
BASOS ABS: 0.1 10*3/uL (ref 0.0–0.1)
EOS%: 1.5 % (ref 0.0–7.0)
Eosinophils Absolute: 0.1 10*3/uL (ref 0.0–0.5)
HEMATOCRIT: 34.3 % — AB (ref 34.8–46.6)
HEMOGLOBIN: 10.9 g/dL — AB (ref 11.6–15.9)
LYMPH%: 11 % — AB (ref 14.0–49.7)
MCH: 25.8 pg (ref 25.1–34.0)
MCHC: 31.8 g/dL (ref 31.5–36.0)
MCV: 81.2 fL (ref 79.5–101.0)
MONO#: 0.6 10*3/uL (ref 0.1–0.9)
MONO%: 9.5 % (ref 0.0–14.0)
NEUT#: 4.7 10*3/uL (ref 1.5–6.5)
NEUT%: 77 % — ABNORMAL HIGH (ref 38.4–76.8)
PLATELETS: 90 10*3/uL — AB (ref 145–400)
RBC: 4.22 10*6/uL (ref 3.70–5.45)
RDW: 22.8 % — ABNORMAL HIGH (ref 11.2–14.5)
WBC: 6.1 10*3/uL (ref 3.9–10.3)
lymph#: 0.7 10*3/uL — ABNORMAL LOW (ref 0.9–3.3)

## 2014-05-30 LAB — MAGNESIUM (CC13)

## 2014-05-30 LAB — CEA: CEA: 53.5 ng/mL — ABNORMAL HIGH (ref 0.0–5.0)

## 2014-05-30 MED ORDER — IRINOTECAN HCL CHEMO INJECTION 100 MG/5ML
100.0000 mg/m2 | Freq: Once | INTRAVENOUS | Status: AC
  Administered 2014-05-30: 180 mg via INTRAVENOUS
  Filled 2014-05-30: qty 9

## 2014-05-30 MED ORDER — SODIUM CHLORIDE 0.9 % IJ SOLN
10.0000 mL | INTRAMUSCULAR | Status: DC | PRN
  Administered 2014-05-30: 10 mL via INTRAVENOUS
  Filled 2014-05-30: qty 10

## 2014-05-30 MED ORDER — HEPARIN SOD (PORK) LOCK FLUSH 100 UNIT/ML IV SOLN
500.0000 [IU] | Freq: Once | INTRAVENOUS | Status: AC | PRN
  Administered 2014-05-30: 500 [IU]
  Filled 2014-05-30: qty 5

## 2014-05-30 MED ORDER — SODIUM CHLORIDE 0.9 % IV SOLN
Freq: Once | INTRAVENOUS | Status: AC
  Administered 2014-05-30: 12:00:00 via INTRAVENOUS

## 2014-05-30 MED ORDER — DEXAMETHASONE SODIUM PHOSPHATE 10 MG/ML IJ SOLN
10.0000 mg | Freq: Once | INTRAMUSCULAR | Status: AC
  Administered 2014-05-30: 10 mg via INTRAVENOUS

## 2014-05-30 MED ORDER — SODIUM CHLORIDE 0.9 % IJ SOLN
10.0000 mL | INTRAMUSCULAR | Status: DC | PRN
  Administered 2014-05-30: 10 mL
  Filled 2014-05-30: qty 10

## 2014-05-30 MED ORDER — PALONOSETRON HCL INJECTION 0.25 MG/5ML
0.2500 mg | Freq: Once | INTRAVENOUS | Status: AC
  Administered 2014-05-30: 0.25 mg via INTRAVENOUS

## 2014-05-30 MED ORDER — PALONOSETRON HCL INJECTION 0.25 MG/5ML
INTRAVENOUS | Status: AC
  Filled 2014-05-30: qty 5

## 2014-05-30 MED ORDER — LORAZEPAM 2 MG/ML IJ SOLN
INTRAMUSCULAR | Status: AC
  Filled 2014-05-30: qty 1

## 2014-05-30 MED ORDER — DEXAMETHASONE SODIUM PHOSPHATE 10 MG/ML IJ SOLN
INTRAMUSCULAR | Status: AC
  Filled 2014-05-30: qty 1

## 2014-05-30 MED ORDER — LORAZEPAM 2 MG/ML IJ SOLN
0.5000 mg | Freq: Once | INTRAMUSCULAR | Status: AC
  Administered 2014-05-30: 0.5 mg via INTRAVENOUS

## 2014-05-30 NOTE — Progress Notes (Signed)
OK to treat with camptosar today despite platelets of 90K.  NO vectibix today/phone message from SusanCoward RN/Dr.BradSherrill PAC hasexcellent blood return.  Pt. States she does not use/need atropine with the camptosar.

## 2014-05-30 NOTE — Patient Instructions (Signed)

## 2014-05-30 NOTE — Telephone Encounter (Signed)
Notified of CEA elevation and MD to plan for CT scan abdomen after her next treatment. Continue to try to take her magnesium oxide consistently.

## 2014-05-30 NOTE — Patient Instructions (Signed)
[facility] Discharge Instructions for Patients Receiving Chemotherapy  Today you received the following chemotherapy agents camptosar.  To help prevent nausea and vomiting after your treatment, we encourage you to take your nausea medication zofran.   If you develop nausea and vomiting that is not controlled by your nausea medication, call the clinic.   BELOW ARE SYMPTOMS THAT SHOULD BE REPORTED IMMEDIATELY:  *FEVER GREATER THAN 100.5 F  *CHILLS WITH OR WITHOUT FEVER  NAUSEA AND VOMITING THAT IS NOT CONTROLLED WITH YOUR NAUSEA MEDICATION  *UNUSUAL SHORTNESS OF BREATH  *UNUSUAL BRUISING OR BLEEDING  TENDERNESS IN MOUTH AND THROAT WITH OR WITHOUT PRESENCE OF ULCERS  *URINARY PROBLEMS  *BOWEL PROBLEMS  UNUSUAL RASH Items with * indicate a potential emergency and should be followed up as soon as possible.  Feel free to call the clinic you have any questions or concerns. The clinic phone number is [phone].

## 2014-05-30 NOTE — Telephone Encounter (Signed)
Per [facility] staff message scheduled appts. Advised scheduler 

## 2014-05-30 NOTE — Progress Notes (Signed)
Per order from Dr. Benay Spice, request for MSI testing sent to J. Epps in pathology re: Accession #: [id number].

## 2014-05-30 NOTE — Progress Notes (Signed)
[facility] OFFICE PROGRESS NOTE   Diagnosis: Colon cancer  INTERVAL HISTORY:   Natalie Mccarthy returns as scheduled. She was treated with irinotecan on 05/16/2014. No nausea or diarrhea following chemotherapy. The scalp rash has improved with prednisone and a clindamycin solution. A mild rash persists over the face. She feels well. No abdominal pain.  Objective:  Vital signs in last 24 hours:  Blood pressure 119/62, pulse 77, temperature 98.4 F (36.9 C), temperature source Oral, resp. rate 18, height $RemoveBe'5\' 7"'[id number]$  (1.702 m), weight 148 lb 8 oz (67.359 kg), SpO2 100.00%.    HEENT: No thrush or ulcers. Resp: Lungs clear bilaterally Cardio: Regular rate and rhythm GI: No hepatomegaly, nontender, no mass Vascular: No leg edema  Skin: mild acne type rash over the face. Several scant lesions over the scalp-these appear improved    Lab Results:  Lab Results  Component Value Date   WBC 6.1 05/30/2014   HGB 10.9* 05/30/2014   HCT 34.3* 05/30/2014   MCV 81.2 05/30/2014   PLT 90* 05/30/2014   NEUTROABS 4.7 05/30/2014    Lab Results  Component Value Date   CEA 53.5* 05/30/2014    I Medications: I have reviewed the patient's current medications.  Assessment/Plan: 1.Metastatic colon cancer, K-ras wild-type, no BRAF mutation , no mismatch repair protein mutation on [facility] 1 testing, with multiple liver metastases noted on an MRI of the abdomen 12/02/2010 and on a CT 12/13/2010. A transverse colon mass was noted on the CT from 02/11/2011 and confirmed on a colonoscopy 12/17/2010. She began treatment with FOLFIRI/Avastin 12/31/2010. A restaging CT 06/20/2011 confirmed further improvement in the metastatic liver lesions and no evidence for progressive metastatic disease. A restaging CT 08/01/2011 showed stable liver lesions and no new lesions. Restaging CT 09/12/2011 showed stable liver lesions and no new lesions. A restaging CT 10/27/2011 showed stable liver lesions and no new  lesions. Restaging CT 12/05/2011 showed mild decrease in size of hepatic metastases and no evidence of disease progression. Restaging CT 01/26/2012 showed stable hepatic metastases and no new lesions. Restaging CT may 5/8/ 2013 revealed a decreased size of hepatic metastases and no new lesions. Restaging CT 04/23/2012 revealed no change in the measurable "target" lesions, a slight increase in the size of other lesions, and no new lesions. Restaging CT 06/04/2012 showed multiple hepatic metastases were grossly unchanged. Restaging CT 07/19/2012 with stable disease in the liver. Infusional 5-fluorouracil continued on a 2 week schedule. Treatment was held after the 08/03/2012 cycle due to thrombocytopenia. Restaging CT evaluation 08/30/2012 with mild increase in size of large hepatic metastases. Treatment was resumed with 5 fluorouracil and Avastin on 08/31/2012. Restaging CT 10/14/2012-stable hepatic metastases. Treatment continued with 5 fluorouracil and Avastin. Restaging CT 12/01/2012 revealed mild progression in the size of multiple hepatic lesions. Treatment was placed on hold. CEA 32.1 on 01/06/2013 and 71.9 on 03/11/2013. Restaging CT abdomen/pelvis 03/31/2013 with interval progression of liver metastases. She began cycle 1 CAPOX on 04/15/2013. The Xeloda was prematurely discontinued on 04/25/2013 due to redness and pain over the feet. She completed cycle 2 beginning 05/24/2013 and cycle 3 beginning on 07/05/2013. Disease progression presenting with severe abdominal pain. She began irinotecan/Panitumumab 09/05/2013. CEA markedly improved at 5.0 on 11/15/2013.  Restaging CT 11/25/2013 with improvement in hepatic metastases and abdominal lymphadenopathy.  Irinotecan/Panitumumab continued.  Restaging CT 03/31/2014 with stable to slight improvement in liver metastases.  Irinotecan/Panitumumab continued. Panitumumab held 05/16/2014 secondary to a severe scalp rash 2. Abdominal pain-? Secondary to liver  metastases, metastatic adenopathy, or the primary colon tumor. Less likely peptic ulcer disease. Improved.  3. Anorexia, early satiety, and weight loss secondary to metastatic colon cancer-resolved.  4. Microcytic anemia. Resolved.  5. History of migraine headaches.  6. Status post uterine fibroid surgery.  7. Port-A-Cath placement 12/20/2010.  8. Delayed nausea following cycle 1 of FOLFIRI/Avastin. Improved with the addition of Aloxi and prophylactic Decadron beginning with cycle #2.  9. Neutropenia secondary to chemotherapy. Cycle #4 was held on 02/11/2011. She subsequently received Neulasta support.  10. History of intermittent fever with no apparent source for infection, likely "tumor fever" or fever related to chemotherapy. No recent fever.  11. History of mucositis secondary to 5-FU, initially grade 2. Irinotecan and 5-FU were dose reduced per study guidelines.  12. History of abdominal cramping. ? Related to irinotecan, 5-FU, or potentially the colon tumor. No recent abdominal cramping.  13. Thrombocytopenia secondary to chemotherapy (while being treated with irinotecan and Avastin). Treatment was held for thrombocytopenia per study guidelines. The thrombocytopenia may be related to Avastin, or chronic liver disease from chemotherapy. The CT on 04/23/2012 confirmed mild splenomegaly. The CT on 06/04/2012 again showed splenomegaly. The thrombocytopenia persisted despite being off of irinotecan and Avastin for several months. The thrombocytopenia improved. She developed thrombocytopenia following cycle 1 and cycle 2 CAPOX. She again has thrombocytopenia. Likely related to chronic liver disease and splenomegaly.  74. Arthralgias-most likely unrelated to the colon cancer diagnosis and chemotherapy. She has been evaluated by orthopedics. The etiology of the arthralgias is unclear.  16. Persistent discomfort at the right shoulder. Negative CT of the right upper extremity on 07/19/2012. MRI on  08/06/2012 with a partial thickness bursal surface tear of the supraspinatus tendon, moderate rotator cuff tendinopathy/tendinosis. She is now followed by orthopedics. The pain has resolved. The range of motion at the right shoulder has improved  17.? Swelling at the right upper extremity and chest wall April 2014-negative right upper extremity Doppler.  18. Delayed nausea following cycle 1 CAPOX. Emend and Aloxi were added with cycle 2.  19. Mucositis and hand-foot syndrome secondary to Xeloda. The Xeloda was dose reduced to 1000 mg twice daily for 14 days with cycle 2.  20. Thrombocytopenia following cycle 1 CAPOX. Cycle 2 and cycle 3 were delayed due to thrombocytopenia. The oxaliplatin was further dose reduced with cycle 3.  21.New onset pruritus 04/04/2013-hyperbilirubinemia and an elevated alkaline phosphatase noted on labs 04/06/2013, the CT 03/31/2013 revealed peripheral biliary ductal dilatation in the medial segment of the left hepatic lobe,no options for intervention after evaluation by Dr. Watt Climes. The hyperbilirubinemia initially resolved and was mildly elevated on 07/26/2013.  22. Acute severe upper abdominal pain, pleuritic, with radiation to the chest/shoulders beginning 07/25/2013-etiology unclear. Chest CT 07/25/2013 showed no evidence of an acute pulmonary embolus and no acute or metastatic findings in the chest. CT abdomen/pelvis on 07/26/2013 showed interval slight progression of metastatic disease to the liver; no new hepatic metastases; slight progression of intrahepatic biliary ductal dilatation; interval marked increase in the size of the spleen; interval increase in the size of a portacaval lymph node and gastrohepatic ligament lymph nodes; contracted thickwalled gallbladder possibly containing sludge. The pain is likely related to metastatic disease involving the liver with possible irritation of the diaphragm. The pain has resolved.  23. Status post biopsy of a liver lesion  08/10/2013. Pathology confirmed metastatic adenocarcinoma. Extended K-ras testing returned negative for the K-ras mutation.  24. Constipation.  25. Panitumumab skin rash. She continues minocycline and  a moisturizer. She is using a steroid cream. The rash over the face has improved. She developed a painful scalp rash-improved with clindamycin solution and prednisone 26. Insomnia, anxiety and restlessness following abrupt discontinuation of Percocet. Resolved after a slow Percocet taper.  27. History of mucositis secondary to chemotherapy.  28. Acute nausea during the irinotecan infusion, lasting for approximately 24 hours. Improved with addition of Ativan to the premedication regimen.  29. Erythema over Port-A-Cath site following application of EMLA cream 11/01/2013-resolved     Disposition:  The scalp rash appears improved with prednisone. She will continue a prednisone taper over the next week. She continues topical steroids and clindamycin solution.   The CEA has been rising over the past 2 months. Panitumumab will be held again today. She will be referred for a restaging CT evaluation. We will consider referring her for radioembolization if there is evidence of disease progression.  She will return for an office visit in 2 weeks.  Betsy Coder, MD  05/30/2014  4:23 PM

## 2014-05-30 NOTE — Telephone Encounter (Signed)
Pt confirmed labs/ov per 07/28 POF, gave pt AVS....KJ °

## 2014-05-30 NOTE — Telephone Encounter (Signed)
Message copied by Tania Ade on Tue May 30, 2014  4:07 PM ------      Message from: Ladell Pier      Created: Tue May 30, 2014  3:12 PM       Please call patient, cea is slightlly higher, plan for abd. CT after next treatment ------

## 2014-05-30 NOTE — Patient Instructions (Signed)
[facility] Discharge Instructions  RECOMMENDATIONS MADE BY THE CONSULTANT AND ANY TEST RESULTS WILL BE SENT TO YOUR REFERRING PHYSICIAN.  MEDICATIONS PRESCRIBED:  Decrease Prednisone to 10 mg daily for 6 days, then stop   Thank you for choosing [facility] to provide your oncology and hematology care.  To afford each patient quality time with our providers, please arrive at least 30 minutes before your scheduled appointment time.  With your help, our goal is to use those 30 minutes to complete the necessary work-up to ensure our physicians have the information they need to help with your evaluation and healthcare recommendations.     ___________________  Should you have questions after your visit to [facility], please contact our office at [phone] between the hours of 8:30 a.m. and 4:30 p.m.  Voicemails left after 4:00 p.m. will not be returned until the following business day.  For prescription refill requests, have your pharmacy contact our office with your prescription refill request. We request 24 hour notice for all refill requests.

## 2014-05-30 NOTE — Telephone Encounter (Signed)
Called back to let her know Dr. Benay Spice has decided to do CT A/P before her treatment on 06/13/14. Scheduling department of radiology will be calling her. She understands and agrees.

## 2014-05-31 ENCOUNTER — Telehealth: Payer: Self-pay | Admitting: *Deleted

## 2014-05-31 DIAGNOSIS — C189 Malignant neoplasm of colon, unspecified: Secondary | ICD-10-CM

## 2014-05-31 IMAGING — CT CT ABD-PELV W/ CM
2 of 6 series · 16 of 46 positions shown, 18 images · IV contrast (OMNIPAQUE)
Comparison: 07/26/2013

CLINICAL DATA: Colon cancer diagnosed 1351. Completed chemotherapy
previously, currently undergoing additional chemotherapy.

EXAM:
CT ABDOMEN AND PELVIS WITH CONTRAST
TECHNIQUE: Multidetector CT imaging of the abdomen and pelvis was performed
using the standard protocol following bolus administration of
intravenous contrast.
CONTRAST:  100mL OMNIPAQUE IOHEXOL 300 MG/ML  SOLN

[Series 3: rtn a/p with · axial · 0.69mm/px · z∈[-651,-246]mm · 13 of 93 slices shown, 15 images]
[im 6/93  soft-tissue]
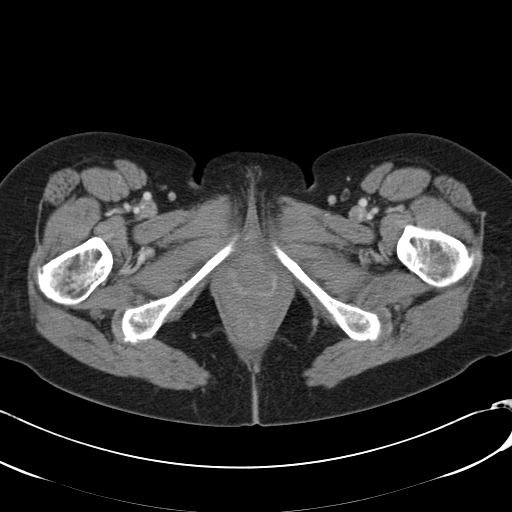
[im 6/93  bone]
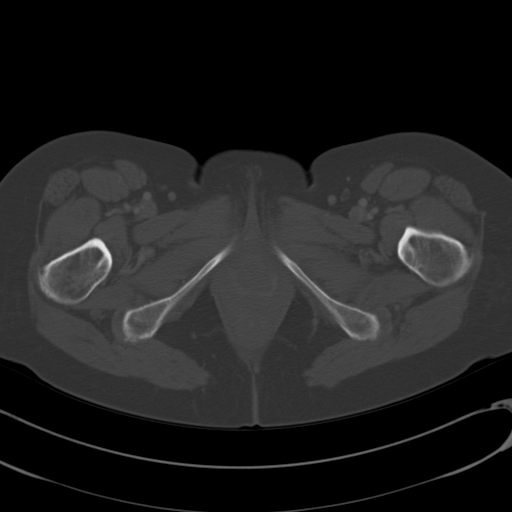
[im 11/93  soft-tissue]
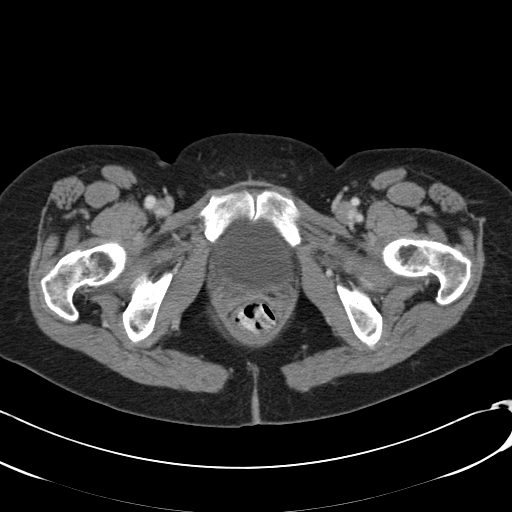
[im 21/93  soft-tissue]
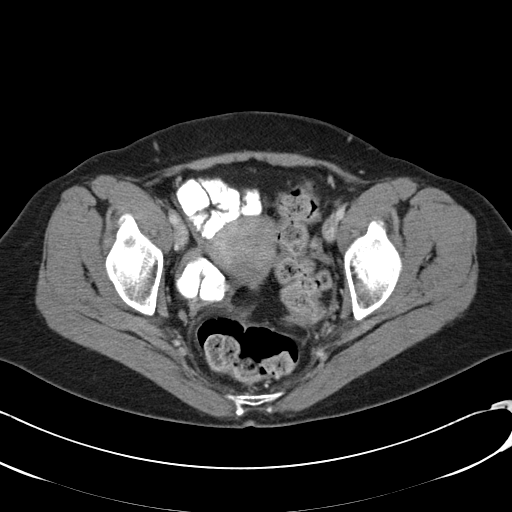
[im 26/93  soft-tissue]
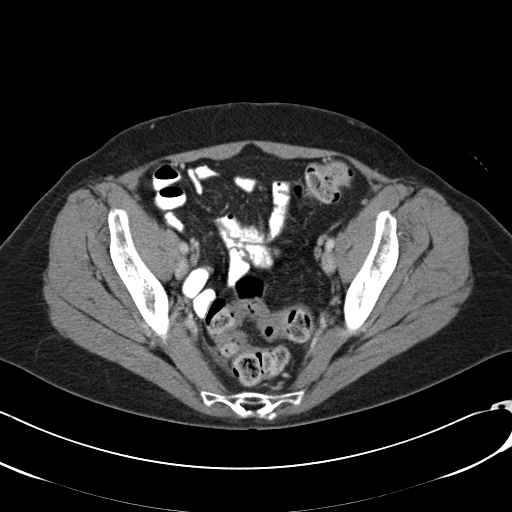
[im 31/93  soft-tissue]
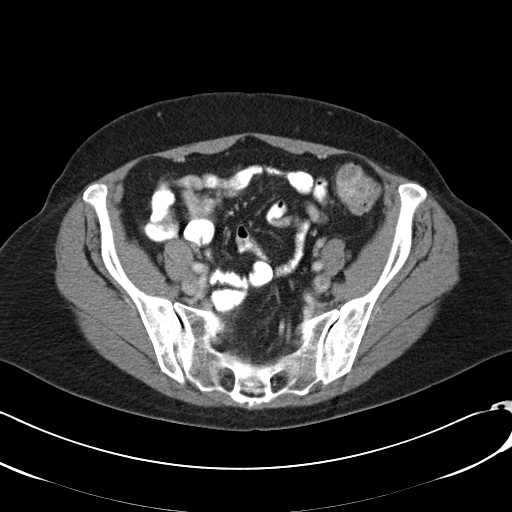
[im 41/93  soft-tissue]
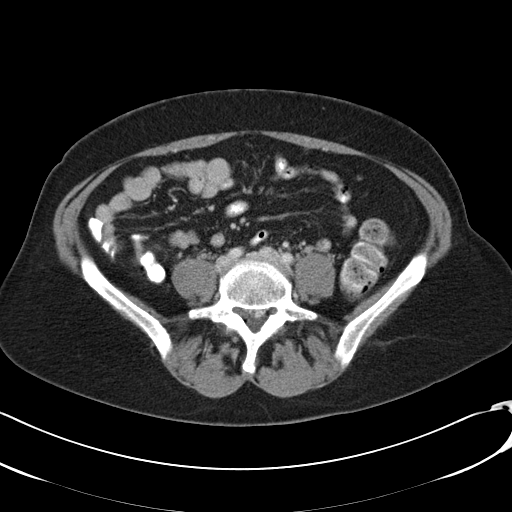
[im 47/93  soft-tissue]
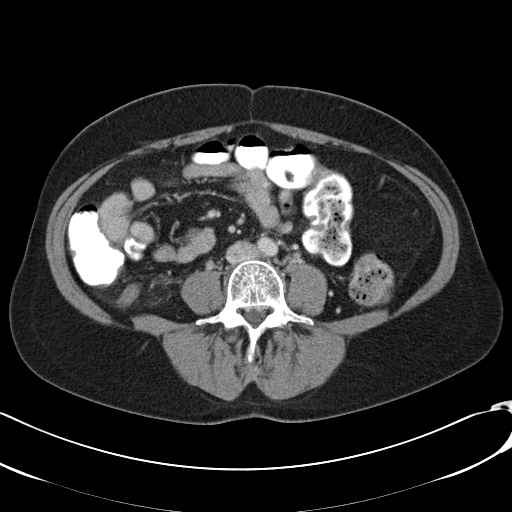
[im 52/93  soft-tissue]
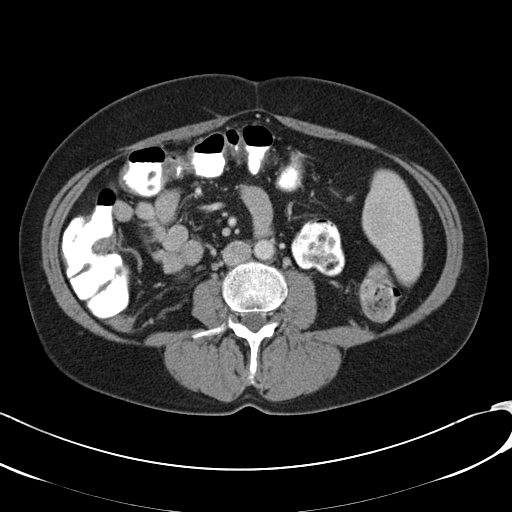
[im 62/93  soft-tissue]
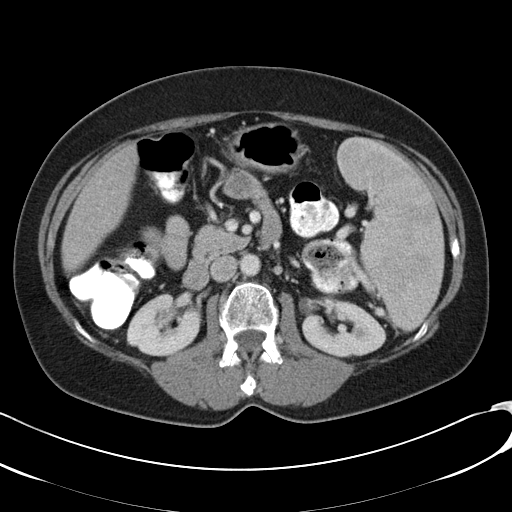
[im 62/93  bone]
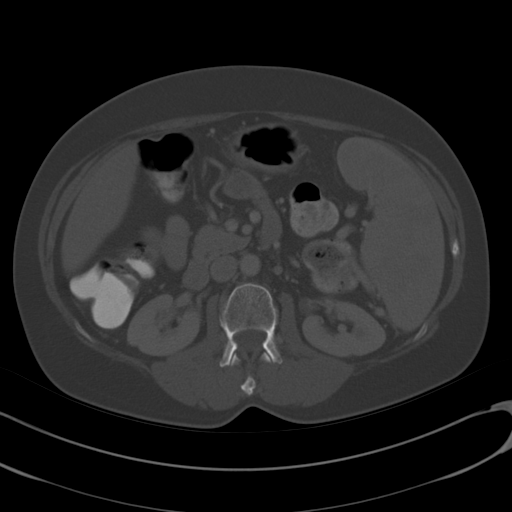
[im 67/93  soft-tissue]
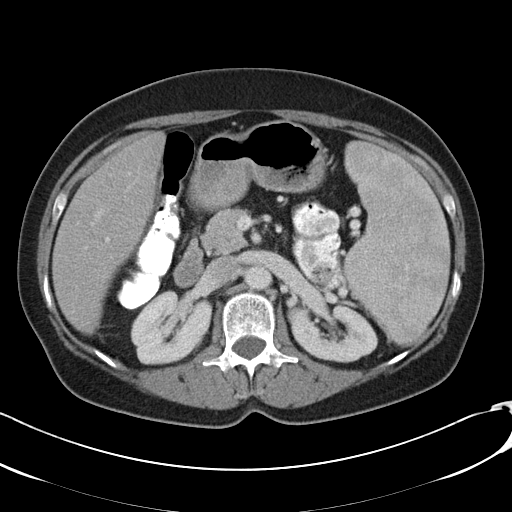
[im 72/93  soft-tissue]
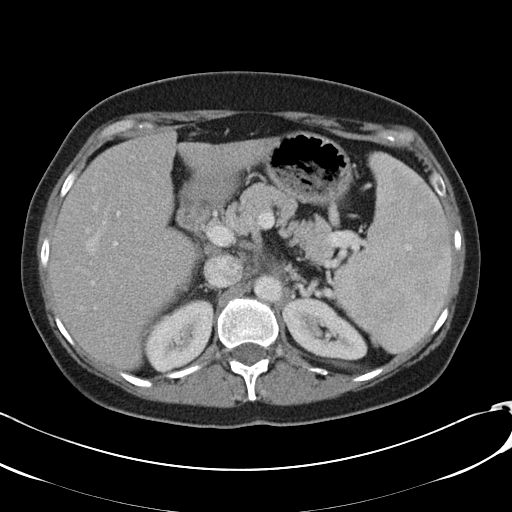
[im 82/93  soft-tissue]
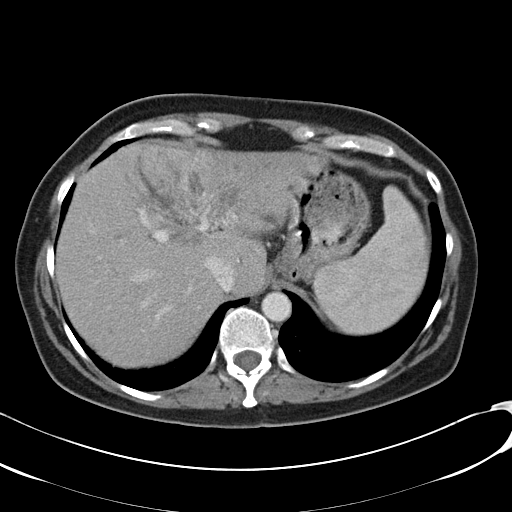
[im 87/93  soft-tissue]
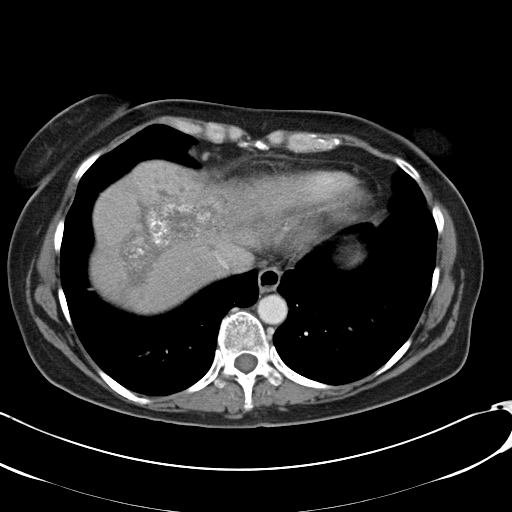

[Series 602: <mpr thick range> · coronal · 0.91mm/px · 3 of 79 slices shown]
[im 27/79  soft-tissue]
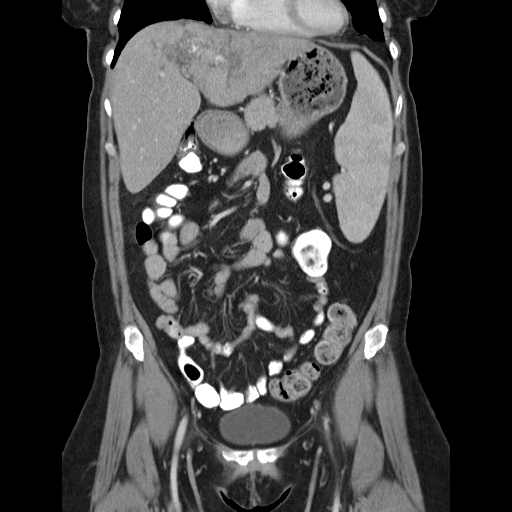
[im 35/79  soft-tissue]
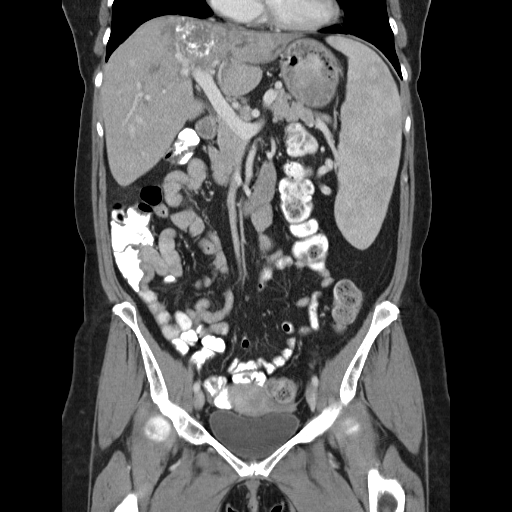
[im 44/79  soft-tissue]
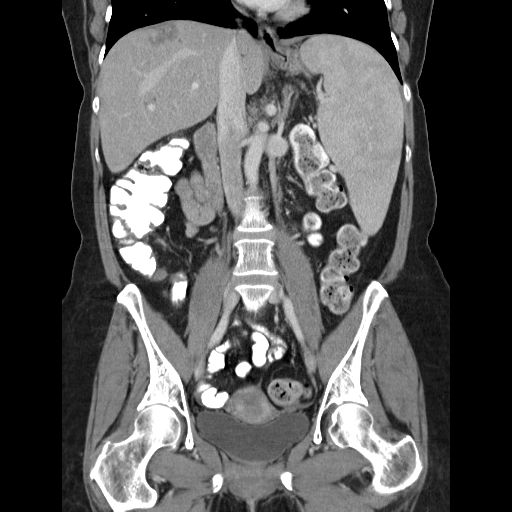

[16 of 46 positions shown; findings below may reference images not displayed]

FINDINGS: The lung bases are clear. Interval decrease in size of dominant
previously measured dome of right hepatic lobe hypodense mass, now
all 3.3 x 3.0 cm image 9 compared to 7.3 x 6.3 cm on the prior exam
at the same anatomic level. The mass now appears to be confluent
with the area of central ill definition at the junction of right and
left hepatic lobes with ill-defined hyperdensity internally again
noted. Mild central intrahepatic ductal dilatation is reidentified
as well as more prominently within the left hepatic lobe. Other
masses throughout the liver appears subjectively decreased.

Gastrohepatic node now measures 0.5 cm image 14, previously 1 cm at
the same anatomic level. Pericaval node now measures 0.8 cm image
22, previously 1.9 cm maximally. Ill-defined soft tissue density
about the celiac axis is reidentified without measurable mass.
Splenomegaly persists, unchanged measuring 14.2 cm image 24.
Kidneys, adrenal glands, and pancreas are normal. Gallbladder is
decompressed.

No ascites or free fluid. The appendix appears mildly dilated at
cm without surrounding stranding or other inflammatory change. There
is stable trace fluid in the right pericolic gutter, for example
image 44. No radiopaque appendicolith is identified. Trace free
pelvic fluid is present image 69. No bowel wall thickening or focal
segmental dilatation. No intrapelvic lymphadenopathy. Bladder is
normal. Uterus and ovaries are normal. Mild disc degenerative change
noted at the lumbar spine.
IMPRESSION: Decrease in size of hepatic metastatic disease status post chemo
embolization or internal high density related to certain colonic
cancer subtypes such as mucinous carcinoma.

Decrease in size of abdominal lymphadenopathy as above.

These findings are compatible with response to treatment.

Increase in diameter of the appendix with trace fluid at the tip
tracking along the pericolic gutter as seen previously. These
findings are nonspecific in a patient without reported abdominal
pain. Appendicitis, metastatic disease, or normal variant appearance
could account for these findings. Correlate with any evidence of
right lower quadrant abdominal pain.

Stable splenomegaly.

## 2014-05-31 NOTE — Telephone Encounter (Signed)
Call from pt asking if it is OK to move CT appt to 06/09/14. Has conflicting appt 5/62. Yes. Pt asks if she will have chest CT? Abd/Pelvis ordered. Will review with Dr. Benay Spice. CT chest without contrast ordered per Dr. Benay Spice.

## 2014-06-05 ENCOUNTER — Other Ambulatory Visit: Payer: Self-pay | Admitting: *Deleted

## 2014-06-05 DIAGNOSIS — C189 Malignant neoplasm of colon, unspecified: Secondary | ICD-10-CM

## 2014-06-05 MED ORDER — PANTOPRAZOLE SODIUM 40 MG PO TBEC
40.0000 mg | DELAYED_RELEASE_TABLET | Freq: Every morning | ORAL | Status: DC
Start: 2014-06-05 — End: 2014-07-26

## 2014-06-05 NOTE — Telephone Encounter (Signed)
Received refill request from [facility] for pt's Protonix. Rx filled electronically per Dr. Benay Spice.

## 2014-06-09 ENCOUNTER — Ambulatory Visit (HOSPITAL_COMMUNITY)
Admission: RE | Admit: 2014-06-09 | Discharge: 2014-06-09 | Disposition: A | Discharge status: Home / Self Care | Payer: BC Managed Care – PPO | Source: Ambulatory Visit | Attending: Oncology | Admitting: Oncology

## 2014-06-09 ENCOUNTER — Encounter (HOSPITAL_COMMUNITY): Payer: Self-pay

## 2014-06-09 ENCOUNTER — Other Ambulatory Visit: Payer: Self-pay | Admitting: Oncology

## 2014-06-09 DIAGNOSIS — R161 Splenomegaly, not elsewhere classified: Secondary | ICD-10-CM | POA: Insufficient documentation

## 2014-06-09 DIAGNOSIS — R918 Other nonspecific abnormal finding of lung field: Secondary | ICD-10-CM | POA: Insufficient documentation

## 2014-06-09 DIAGNOSIS — C189 Malignant neoplasm of colon, unspecified: Secondary | ICD-10-CM

## 2014-06-09 DIAGNOSIS — K7689 Other specified diseases of liver: Secondary | ICD-10-CM | POA: Insufficient documentation

## 2014-06-09 DIAGNOSIS — R188 Other ascites: Secondary | ICD-10-CM | POA: Insufficient documentation

## 2014-06-09 DIAGNOSIS — R599 Enlarged lymph nodes, unspecified: Secondary | ICD-10-CM | POA: Insufficient documentation

## 2014-06-09 MED ORDER — IOHEXOL 300 MG/ML  SOLN
50.0000 mL | Freq: Once | INTRAMUSCULAR | Status: AC | PRN
  Administered 2014-06-09: 50 mL via ORAL

## 2014-06-09 MED ORDER — IOHEXOL 300 MG/ML  SOLN
100.0000 mL | Freq: Once | INTRAMUSCULAR | Status: AC | PRN
  Administered 2014-06-09: 100 mL via INTRAVENOUS

## 2014-06-12 ENCOUNTER — Ambulatory Visit (HOSPITAL_COMMUNITY): Payer: BC Managed Care – PPO

## 2014-06-13 ENCOUNTER — Telehealth: Payer: Self-pay | Admitting: Nurse Practitioner

## 2014-06-13 ENCOUNTER — Ambulatory Visit: Payer: BC Managed Care – PPO

## 2014-06-13 ENCOUNTER — Telehealth: Payer: Self-pay | Admitting: Oncology

## 2014-06-13 ENCOUNTER — Encounter (HOSPITAL_COMMUNITY): Payer: Self-pay

## 2014-06-13 ENCOUNTER — Other Ambulatory Visit (HOSPITAL_BASED_OUTPATIENT_CLINIC_OR_DEPARTMENT_OTHER): Payer: BC Managed Care – PPO

## 2014-06-13 ENCOUNTER — Ambulatory Visit (HOSPITAL_BASED_OUTPATIENT_CLINIC_OR_DEPARTMENT_OTHER): Payer: BC Managed Care – PPO | Admitting: Nurse Practitioner

## 2014-06-13 VITALS — BP 129/65 | HR 83 | Resp 18 | Ht 67.0 in | Wt 148.2 lb

## 2014-06-13 DIAGNOSIS — C189 Malignant neoplasm of colon, unspecified: Secondary | ICD-10-CM

## 2014-06-13 DIAGNOSIS — C184 Malignant neoplasm of transverse colon: Secondary | ICD-10-CM

## 2014-06-13 DIAGNOSIS — C7800 Secondary malignant neoplasm of unspecified lung: Secondary | ICD-10-CM

## 2014-06-13 DIAGNOSIS — C787 Secondary malignant neoplasm of liver and intrahepatic bile duct: Secondary | ICD-10-CM

## 2014-06-13 LAB — COMPREHENSIVE METABOLIC PANEL
ALK PHOS: 387 U/L — AB (ref 39–117)
ALT: 34 U/L (ref 0–35)
AST: 46 U/L — AB (ref 0–37)
Albumin: 3.2 g/dL — ABNORMAL LOW (ref 3.5–5.2)
BUN: 12 mg/dL (ref 6–23)
CALCIUM: 9.4 mg/dL (ref 8.4–10.5)
CO2: 25 mEq/L (ref 19–32)
CREATININE: 0.62 mg/dL (ref 0.50–1.10)
Chloride: 103 mEq/L (ref 96–112)
Glucose, Bld: 162 mg/dL — ABNORMAL HIGH (ref 70–99)
Potassium: 3.9 mEq/L (ref 3.5–5.3)
Sodium: 140 mEq/L (ref 135–145)
Total Bilirubin: 0.8 mg/dL (ref 0.2–1.2)
Total Protein: 6.5 g/dL (ref 6.0–8.3)

## 2014-06-13 LAB — CBC WITH DIFFERENTIAL/PLATELET
BASO%: 1.1 % (ref 0.0–2.0)
Basophils Absolute: 0 10*3/uL (ref 0.0–0.1)
EOS%: 7.2 % — AB (ref 0.0–7.0)
Eosinophils Absolute: 0.1 10*3/uL (ref 0.0–0.5)
HCT: 32 % — ABNORMAL LOW (ref 34.8–46.6)
HGB: 10.1 g/dL — ABNORMAL LOW (ref 11.6–15.9)
LYMPH#: 0.2 10*3/uL — AB (ref 0.9–3.3)
LYMPH%: 10.5 % — AB (ref 14.0–49.7)
MCH: 25.6 pg (ref 25.1–34.0)
MCHC: 31.5 g/dL (ref 31.5–36.0)
MCV: 81.3 fL (ref 79.5–101.0)
MONO#: 0.3 10*3/uL (ref 0.1–0.9)
MONO%: 14.4 % — ABNORMAL HIGH (ref 0.0–14.0)
NEUT#: 1.4 10*3/uL — ABNORMAL LOW (ref 1.5–6.5)
NEUT%: 66.8 % (ref 38.4–76.8)
PLATELETS: 102 10*3/uL — AB (ref 145–400)
RBC: 3.93 10*6/uL (ref 3.70–5.45)
RDW: 23.3 % — ABNORMAL HIGH (ref 11.2–14.5)
WBC: 2 10*3/uL — AB (ref 3.9–10.3)

## 2014-06-13 LAB — CEA: CEA: 98.5 ng/mL — AB (ref 0.0–5.0)

## 2014-06-13 LAB — MAGNESIUM: Magnesium: 1.7 mg/dL (ref 1.5–2.5)

## 2014-06-13 MED ORDER — OXYCODONE-ACETAMINOPHEN 5-325 MG PO TABS: 1.0000 | ORAL_TABLET | Freq: Four times a day (QID) | ORAL | Status: DC | PRN

## 2014-06-13 NOTE — Telephone Encounter (Signed)
Msg Tiff/Kimberly per 08/11 POF referral ..Marland KitchenMarland KitchenMarland KitchenKJ

## 2014-06-13 NOTE — Telephone Encounter (Signed)
Pt confirmed labs/ov per 08/11 POF, gave pt AVS...KJ °

## 2014-06-13 NOTE — Progress Notes (Addendum)
[facility] OFFICE PROGRESS NOTE   Diagnosis:  Colon cancer.  INTERVAL HISTORY:   Ms. Heckman returns as scheduled. She was last treated with irinotecan 05/30/2014. She had some recent nausea. No diarrhea. Bowels moving regularly overall. She reports a good appetite. Since completing the course of prednisone she has noted worsening of the rash over her scalp. She had some bleeding from the scalp rash over the weekend. Facial rash continues to be better. Over the past 3-4 days she has had a constant pain across the abdomen. She took Motrin with improvement. She tried tramadol with no improvement. She denies shortness of breath or cough. No fever.  Objective:  Vital signs in last 24 hours:  Blood pressure 129/65, pulse 83, resp. rate 18, height 5\' 7"  (1.702 m), weight 148 lb 3.2 oz (67.223 kg).    HEENT: No thrush or ulcers. Resp: Lungs clear bilaterally. Cardio: Regular rate and rhythm. GI: Abdomen is soft and nontender. No hepatomegaly. No mass. Vascular: No leg edema. Calves soft and nontender.  Skin: Mild acne-like rash over the face. Multiple erythematous scalp lesions, some scabbed.    Lab Results:  Lab Results  Component Value Date   WBC 2.0* 06/13/2014   HGB 10.1* 06/13/2014   HCT 32.0* 06/13/2014   MCV 81.3 06/13/2014   PLT 102* 06/13/2014   NEUTROABS 1.4* 06/13/2014    Imaging:  No results found.  Medications: I have reviewed the patient's current medications.  Assessment/Plan: 1.Metastatic colon cancer, K-ras wild-type, no BRAF mutation , no mismatch repair protein mutation on Foundation 1 testing, with multiple liver metastases noted on an MRI of the abdomen 12/02/2010 and on a CT 12/13/2010. A transverse colon mass was noted on the CT from 02/11/2011 and confirmed on a colonoscopy 12/17/2010. She began treatment with FOLFIRI/Avastin 12/31/2010. A restaging CT 06/20/2011 confirmed further improvement in the metastatic liver lesions and no evidence for  progressive metastatic disease. A restaging CT 08/01/2011 showed stable liver lesions and no new lesions. Restaging CT 09/12/2011 showed stable liver lesions and no new lesions. A restaging CT 10/27/2011 showed stable liver lesions and no new lesions. Restaging CT 12/05/2011 showed mild decrease in size of hepatic metastases and no evidence of disease progression. Restaging CT 01/26/2012 showed stable hepatic metastases and no new lesions. Restaging CT may 5/8/ 2013 revealed a decreased size of hepatic metastases and no new lesions. Restaging CT 04/23/2012 revealed no change in the measurable "target" lesions, a slight increase in the size of other lesions, and no new lesions. Restaging CT 06/04/2012 showed multiple hepatic metastases were grossly unchanged. Restaging CT 07/19/2012 with stable disease in the liver. Infusional 5-fluorouracil continued on a 2 week schedule. Treatment was held after the 08/03/2012 cycle due to thrombocytopenia. Restaging CT evaluation 08/30/2012 with mild increase in size of large hepatic metastases. Treatment was resumed with 5 fluorouracil and Avastin on 08/31/2012. Restaging CT 10/14/2012-stable hepatic metastases. Treatment continued with 5 fluorouracil and Avastin. Restaging CT 12/01/2012 revealed mild progression in the size of multiple hepatic lesions. Treatment was placed on hold. CEA 32.1 on 01/06/2013 and 71.9 on 03/11/2013. Restaging CT abdomen/pelvis 03/31/2013 with interval progression of liver metastases. She began cycle 1 CAPOX on 04/15/2013. The Xeloda was prematurely discontinued on 04/25/2013 due to redness and pain over the feet. She completed cycle 2 beginning 05/24/2013 and cycle 3 beginning on 07/05/2013. Disease progression presenting with severe abdominal pain. She began irinotecan/Panitumumab 09/05/2013. CEA markedly improved at 5.0 on 11/15/2013.  Restaging CT 11/25/2013 with improvement  in hepatic metastases and abdominal lymphadenopathy.   Irinotecan/Panitumumab continued.  Restaging CT 03/31/2014 with stable to slight improvement in liver metastases.  Irinotecan/Panitumumab continued.  Panitumumab held 05/16/2014 secondary to a severe scalp rash. Irinotecan continued. 2. Abdominal pain-? Secondary to liver metastases, metastatic adenopathy, or the primary colon tumor. Less likely peptic ulcer disease. 3. Anorexia, early satiety, and weight loss secondary to metastatic colon cancer-resolved.  4. Microcytic anemia. Resolved.  5. History of migraine headaches.  6. Status post uterine fibroid surgery.  7. Port-A-Cath placement 12/20/2010.  8. Delayed nausea following cycle 1 of FOLFIRI/Avastin. Improved with the addition of Aloxi and prophylactic Decadron beginning with cycle #2.  9. Neutropenia secondary to chemotherapy. Cycle #4 was held on 02/11/2011. She subsequently received Neulasta support.  10. History of intermittent fever with no apparent source for infection, likely "tumor fever" or fever related to chemotherapy. No recent fever.  11. History of mucositis secondary to 5-FU, initially grade 2. Irinotecan and 5-FU were dose reduced per study guidelines.  12. History of abdominal cramping. ? Related to irinotecan, 5-FU, or potentially the colon tumor. No recent abdominal cramping.  13. Thrombocytopenia secondary to chemotherapy (while being treated with irinotecan and Avastin). Treatment was held for thrombocytopenia per study guidelines. The thrombocytopenia may be related to Avastin, or chronic liver disease from chemotherapy. The CT on 04/23/2012 confirmed mild splenomegaly. The CT on 06/04/2012 again showed splenomegaly. The thrombocytopenia persisted despite being off of irinotecan and Avastin for several months. The thrombocytopenia improved. She developed thrombocytopenia following cycle 1 and cycle 2 CAPOX. She again has thrombocytopenia. Likely related to chronic liver disease and splenomegaly.  15. Arthralgias-most  likely unrelated to the colon cancer diagnosis and chemotherapy. She has been evaluated by orthopedics. The etiology of the arthralgias is unclear.  16. Persistent discomfort at the right shoulder. Negative CT of the right upper extremity on 07/19/2012. MRI on 08/06/2012 with a partial thickness bursal surface tear of the supraspinatus tendon, moderate rotator cuff tendinopathy/tendinosis. She is now followed by orthopedics. The pain has resolved. The range of motion at the right shoulder has improved  17.? Swelling at the right upper extremity and chest wall April 2014-negative right upper extremity Doppler.  18. Delayed nausea following cycle 1 CAPOX. Emend and Aloxi were added with cycle 2.  19. Mucositis and hand-foot syndrome secondary to Xeloda. The Xeloda was dose reduced to 1000 mg twice daily for 14 days with cycle 2.  20. Thrombocytopenia following cycle 1 CAPOX. Cycle 2 and cycle 3 were delayed due to thrombocytopenia. The oxaliplatin was further dose reduced with cycle 3.  21.New onset pruritus 04/04/2013-hyperbilirubinemia and an elevated alkaline phosphatase noted on labs 04/06/2013, the CT 03/31/2013 revealed peripheral biliary ductal dilatation in the medial segment of the left hepatic lobe,no options for intervention after evaluation by Dr. Ewing Schlein. The hyperbilirubinemia initially resolved and was mildly elevated on 07/26/2013.  22. Acute severe upper abdominal pain, pleuritic, with radiation to the chest/shoulders beginning 07/25/2013-etiology unclear. Chest CT 07/25/2013 showed no evidence of an acute pulmonary embolus and no acute or metastatic findings in the chest. CT abdomen/pelvis on 07/26/2013 showed interval slight progression of metastatic disease to the liver; no new hepatic metastases; slight progression of intrahepatic biliary ductal dilatation; interval marked increase in the size of the spleen; interval increase in the size of a portacaval lymph node and gastrohepatic ligament  lymph nodes; contracted thickwalled gallbladder possibly containing sludge. The pain is likely related to metastatic disease involving the liver with possible irritation of  the diaphragm. The pain resolved.  23. Status post biopsy of a liver lesion 08/10/2013. Pathology confirmed metastatic adenocarcinoma. Extended K-ras testing returned negative for the K-ras mutation.  24. Constipation.  25. Panitumumab skin rash. She continues minocycline and a moisturizer. She is using a steroid cream. The rash over the face has improved. She developed a painful scalp rash-improved with clindamycin solution and prednisone. Scalp rash increased since completing prednisone.  26. Insomnia, anxiety and restlessness following abrupt discontinuation of Percocet. Resolved after a slow Percocet taper.  27. History of mucositis secondary to chemotherapy.  28. Acute nausea during the irinotecan infusion, lasting for approximately 24 hours. Improved with addition of Ativan to the premedication regimen.  29. Erythema over Port-A-Cath site following application of EMLA cream 11/01/2013-resolved.     Disposition: Ms. Blouch appears stable. Recent restaging CT evaluation shows worsening of the liver metastases and new lung metastases. Dr. Benay Spice reviewed the images on the computer with Ms. Sweezy and her husband.  She is having increased abdominal pain. She was given a prescription for Percocet.  We have requested MSI by PCR.  Dr. Benay Spice recommends a referral to Dr. Reynaldo Minium at [facility] for treatment options including possible enrollment on a clinical trial.  She will return for a followup visit with Dr. Benay Spice on 06/27/2014. She knows to contact the office in the interim with any problems.  Patient seen with Dr. Benay Spice. 25 minutes were spent face-to-face at today's visit with the majority that time involved in counseling/coordination of care.    Ned Card ANP/GNP-BC   06/13/2014  12:28 PM  This was a  shared visit with Ned Card. There is clinical and CT evidence of disease progression. We discussed treatment options.  We had planned to refer her for Y-90, but there is now disease in the chest. I will refer her to Dr. Reynaldo Minium to consider clinica trial options(?Cab/P-mab). We are waiting MSI testing that will likely return negative.  Julieanne Manson, MD

## 2014-06-14 ENCOUNTER — Telehealth: Payer: Self-pay | Admitting: Oncology

## 2014-06-14 NOTE — Telephone Encounter (Signed)
Pt appt to see Dr. Reynaldo Minium is 06/22/14@3 :00. Medical records faxed. Slides will be fedex'ed. Pt is aware to hand carry cd to appt.

## 2014-06-23 ENCOUNTER — Encounter (HOSPITAL_COMMUNITY): Payer: Self-pay

## 2014-06-26 ENCOUNTER — Other Ambulatory Visit: Payer: Self-pay | Admitting: *Deleted

## 2014-06-26 DIAGNOSIS — C189 Malignant neoplasm of colon, unspecified: Secondary | ICD-10-CM

## 2014-06-26 MED ORDER — IBUPROFEN 600 MG PO TABS: 600.0000 mg | ORAL_TABLET | Freq: Four times a day (QID) | ORAL | Status: DC | PRN

## 2014-06-27 ENCOUNTER — Ambulatory Visit (HOSPITAL_BASED_OUTPATIENT_CLINIC_OR_DEPARTMENT_OTHER): Payer: BC Managed Care – PPO | Admitting: Oncology

## 2014-06-27 ENCOUNTER — Ambulatory Visit: Payer: BC Managed Care – PPO

## 2014-06-27 ENCOUNTER — Other Ambulatory Visit (HOSPITAL_BASED_OUTPATIENT_CLINIC_OR_DEPARTMENT_OTHER): Payer: BC Managed Care – PPO

## 2014-06-27 ENCOUNTER — Telehealth: Payer: Self-pay | Admitting: *Deleted

## 2014-06-27 ENCOUNTER — Telehealth: Payer: Self-pay | Admitting: Oncology

## 2014-06-27 ENCOUNTER — Other Ambulatory Visit: Payer: Self-pay | Admitting: *Deleted

## 2014-06-27 VITALS — BP 134/63 | HR 85 | Temp 98.1°F | Resp 20 | Ht 67.0 in | Wt 146.7 lb

## 2014-06-27 DIAGNOSIS — C184 Malignant neoplasm of transverse colon: Secondary | ICD-10-CM

## 2014-06-27 DIAGNOSIS — C189 Malignant neoplasm of colon, unspecified: Secondary | ICD-10-CM

## 2014-06-27 DIAGNOSIS — C787 Secondary malignant neoplasm of liver and intrahepatic bile duct: Secondary | ICD-10-CM

## 2014-06-27 DIAGNOSIS — D696 Thrombocytopenia, unspecified: Secondary | ICD-10-CM

## 2014-06-27 DIAGNOSIS — R109 Unspecified abdominal pain: Secondary | ICD-10-CM

## 2014-06-27 LAB — CBC WITH DIFFERENTIAL/PLATELET
BASO%: 0.2 % (ref 0.0–2.0)
Basophils Absolute: 0 10*3/uL (ref 0.0–0.1)
EOS ABS: 0.2 10*3/uL (ref 0.0–0.5)
EOS%: 3.1 % (ref 0.0–7.0)
HEMATOCRIT: 35.6 % (ref 34.8–46.6)
HEMOGLOBIN: 11.2 g/dL — AB (ref 11.6–15.9)
LYMPH%: 8.7 % — AB (ref 14.0–49.7)
MCH: 25.1 pg (ref 25.1–34.0)
MCHC: 31.5 g/dL (ref 31.5–36.0)
MCV: 79.7 fL (ref 79.5–101.0)
MONO#: 0.6 10*3/uL (ref 0.1–0.9)
MONO%: 10.1 % (ref 0.0–14.0)
NEUT%: 77.9 % — AB (ref 38.4–76.8)
NEUTROS ABS: 4.4 10*3/uL (ref 1.5–6.5)
PLATELETS: 154 10*3/uL (ref 145–400)
RBC: 4.46 10*6/uL (ref 3.70–5.45)
RDW: 21.1 % — AB (ref 11.2–14.5)
WBC: 5.7 10*3/uL (ref 3.9–10.3)
lymph#: 0.5 10*3/uL — ABNORMAL LOW (ref 0.9–3.3)

## 2014-06-27 LAB — COMPREHENSIVE METABOLIC PANEL (CC13)
ALK PHOS: 506 U/L — AB (ref 40–150)
ALT: 42 U/L (ref 0–55)
AST: 61 U/L — AB (ref 5–34)
Albumin: 3.5 g/dL (ref 3.5–5.0)
Anion Gap: 8 mEq/L (ref 3–11)
BUN: 15.4 mg/dL (ref 7.0–26.0)
CO2: 26 mEq/L (ref 22–29)
CREATININE: 0.7 mg/dL (ref 0.6–1.1)
Calcium: 9.8 mg/dL (ref 8.4–10.4)
Chloride: 106 mEq/L (ref 98–109)
Glucose: 127 mg/dl (ref 70–140)
POTASSIUM: 4.2 meq/L (ref 3.5–5.1)
Sodium: 139 mEq/L (ref 136–145)
Total Bilirubin: 0.7 mg/dL (ref 0.20–1.20)
Total Protein: 7.4 g/dL (ref 6.4–8.3)

## 2014-06-27 LAB — MAGNESIUM (CC13): MAGNESIUM: 2 mg/dL (ref 1.5–2.5)

## 2014-06-27 MED ORDER — HYDROCODONE-ACETAMINOPHEN 5-325 MG PO TABS: 1.0000 | ORAL_TABLET | ORAL | Status: DC | PRN

## 2014-06-27 NOTE — Telephone Encounter (Signed)
Pt confirmed ov per 08/25 POF, gave pt AVS....KJ

## 2014-06-27 NOTE — Telephone Encounter (Signed)
Kathi with [facility] called asking if patient is to receive both Percocet and Norco today.  Ms. Wilhite brought a percocet script dated 06-13-2014 and a Norco script with today's date to pharmacy.  Medication list and collaborative nurse report dr. Gearldine Shown instructions today were to discontinue percocet.  Pharmacist instructed to only fill Norco.

## 2014-06-27 NOTE — Progress Notes (Signed)
[facility] OFFICE PROGRESS NOTE   Diagnosis: Colon cancer  INTERVAL HISTORY:   She returns as scheduled. The scalp rash has improved. She has increased pain at the right costal margin that is worse with a deep inspiration. The pain is not relieved with tramadol and oxycodone causes CNS effects. She saw Dr. Reynaldo Minium 06/21/2014. She has decided to enroll on the panitumumab/cabozantinib study.  Objective:  Vital signs in last 24 hours:  Blood pressure 134/63, pulse 85, temperature 98.1 F (36.7 C), temperature source Oral, resp. rate 20, height $RemoveBe'5\' 7"'iMsnvJlxW$  (1.702 m), weight 146 lb 11.2 oz (66.543 kg).    HEENT: Healing scalp lesions Resp: Lungs clear bilaterally Cardio: Regular in rhythm GI: Mild tenderness in the right upper abdomen, no hepatomegaly Vascular: No leg edema  Skin: Mild acne type rash over the face   Portacath/PICC-without erythema  Lab Results:  Lab Results  Component Value Date   WBC 5.7 06/27/2014   HGB 11.2* 06/27/2014   HCT 35.6 06/27/2014   MCV 79.7 06/27/2014   PLT 154 06/27/2014   NEUTROABS 4.4 06/27/2014     Lab Results  Component Value Date   CEA 98.5* 06/13/2014    Imaging:  No results found.  Medications: I have reviewed the patient's current medications.  Assessment/Plan: 1.Metastatic colon cancer, K-ras wild-type, microsatellite stable, no BRAF mutation , no mismatch repair protein mutation on Foundation 1 testing, with multiple liver metastases noted on an MRI of the abdomen 12/02/2010 and on a CT 12/13/2010. A transverse colon mass was noted on the CT from 02/11/2011 and confirmed on a colonoscopy 12/17/2010. She began treatment with FOLFIRI/Avastin 12/31/2010. A restaging CT 06/20/2011 confirmed further improvement in the metastatic liver lesions and no evidence for progressive metastatic disease. A restaging CT 08/01/2011 showed stable liver lesions and no new lesions. Restaging CT 09/12/2011 showed stable liver lesions and no  new lesions. A restaging CT 10/27/2011 showed stable liver lesions and no new lesions. Restaging CT 12/05/2011 showed mild decrease in size of hepatic metastases and no evidence of disease progression. Restaging CT 01/26/2012 showed stable hepatic metastases and no new lesions. Restaging CT may 5/8/ 2013 revealed a decreased size of hepatic metastases and no new lesions. Restaging CT 04/23/2012 revealed no change in the measurable "target" lesions, a slight increase in the size of other lesions, and no new lesions. Restaging CT 06/04/2012 showed multiple hepatic metastases were grossly unchanged. Restaging CT 07/19/2012 with stable disease in the liver. Infusional 5-fluorouracil continued on a 2 week schedule. Treatment was held after the 08/03/2012 cycle due to thrombocytopenia. Restaging CT evaluation 08/30/2012 with mild increase in size of large hepatic metastases. Treatment was resumed with 5 fluorouracil and Avastin on 08/31/2012. Restaging CT 10/14/2012-stable hepatic metastases. Treatment continued with 5 fluorouracil and Avastin. Restaging CT 12/01/2012 revealed mild progression in the size of multiple hepatic lesions. Treatment was placed on hold. CEA 32.1 on 01/06/2013 and 71.9 on 03/11/2013. Restaging CT abdomen/pelvis 03/31/2013 with interval progression of liver metastases. She began cycle 1 CAPOX on 04/15/2013. The Xeloda was prematurely discontinued on 04/25/2013 due to redness and pain over the feet. She completed cycle 2 beginning 05/24/2013 and cycle 3 beginning on 07/05/2013. Disease progression presenting with severe abdominal pain. She began irinotecan/Panitumumab 09/05/2013. CEA markedly improved at 5.0 on 11/15/2013.  Restaging CT 11/25/2013 with improvement in hepatic metastases and abdominal lymphadenopathy.  Irinotecan/Panitumumab continued.  Restaging CT 03/31/2014 with stable to slight improvement in liver metastases.  Irinotecan/Panitumumab continued.  Panitumumab held 05/16/2014  secondary to a severe scalp rash.  Irinotecan continued. 2. Abdominal pain-? Secondary to liver metastases, metastatic adenopathy, or the primary colon tumor. Less likely peptic ulcer disease.  3. Anorexia, early satiety, and weight loss secondary to metastatic colon cancer-resolved.  4. Microcytic anemia. Resolved.  5. History of migraine headaches.  6. Status post uterine fibroid surgery.  7. Port-A-Cath placement 12/20/2010.  8. Delayed nausea following cycle 1 of FOLFIRI/Avastin. Improved with the addition of Aloxi and prophylactic Decadron beginning with cycle #2.  9. Neutropenia secondary to chemotherapy. Cycle #4 was held on 02/11/2011. She subsequently received Neulasta support.  10. History of intermittent fever with no apparent source for infection, likely "tumor fever" or fever related to chemotherapy. No recent fever.  11. History of mucositis secondary to 5-FU, initially grade 2. Irinotecan and 5-FU were dose reduced per study guidelines.  12. History of abdominal cramping. ? Related to irinotecan, 5-FU, or potentially the colon tumor. No recent abdominal cramping.  13. Thrombocytopenia secondary to chemotherapy (while being treated with irinotecan and Avastin). Treatment was held for thrombocytopenia per study guidelines. The thrombocytopenia may be related to Avastin, or chronic liver disease from chemotherapy. The CT on 04/23/2012 confirmed mild splenomegaly. The CT on 06/04/2012 again showed splenomegaly. The thrombocytopenia persisted despite being off of irinotecan and Avastin for several months. The thrombocytopenia improved. She developed thrombocytopenia following cycle 1 and cycle 2 CAPOX. She again has thrombocytopenia. Likely related to chronic liver disease and splenomegaly.  40. Arthralgias-most likely unrelated to the colon cancer diagnosis and chemotherapy. She has been evaluated by orthopedics. The etiology of the arthralgias is unclear.  16. Persistent discomfort at the  right shoulder. Negative CT of the right upper extremity on 07/19/2012. MRI on 08/06/2012 with a partial thickness bursal surface tear of the supraspinatus tendon, moderate rotator cuff tendinopathy/tendinosis. She is now followed by orthopedics. The pain has resolved. The range of motion at the right shoulder has improved  17.? Swelling at the right upper extremity and chest wall April 2014-negative right upper extremity Doppler.  18. Delayed nausea following cycle 1 CAPOX. Emend and Aloxi were added with cycle 2.  19. Mucositis and hand-foot syndrome secondary to Xeloda. The Xeloda was dose reduced to 1000 mg twice daily for 14 days with cycle 2.  20. Thrombocytopenia following cycle 1 CAPOX. Cycle 2 and cycle 3 were delayed due to thrombocytopenia. The oxaliplatin was further dose reduced with cycle 3.  21.New onset pruritus 04/04/2013-hyperbilirubinemia and an elevated alkaline phosphatase noted on labs 04/06/2013, the CT 03/31/2013 revealed peripheral biliary ductal dilatation in the medial segment of the left hepatic lobe,no options for intervention after evaluation by Dr. Watt Climes. The hyperbilirubinemia initially resolved and was mildly elevated on 07/26/2013.  22. Acute severe upper abdominal pain, pleuritic, with radiation to the chest/shoulders beginning 07/25/2013-etiology unclear. Chest CT 07/25/2013 showed no evidence of an acute pulmonary embolus and no acute or metastatic findings in the chest. CT abdomen/pelvis on 07/26/2013 showed interval slight progression of metastatic disease to the liver; no new hepatic metastases; slight progression of intrahepatic biliary ductal dilatation; interval marked increase in the size of the spleen; interval increase in the size of a portacaval lymph node and gastrohepatic ligament lymph nodes; contracted thickwalled gallbladder possibly containing sludge. The pain is likely related to metastatic disease involving the liver with possible irritation of the  diaphragm. The pain resolved.  23. Status post biopsy of a liver lesion 08/10/2013. Pathology confirmed metastatic adenocarcinoma. Extended K-ras testing returned negative for the K-ras  mutation.  24. Constipation.  25. Panitumumab skin rash. She continues minocycline and a moisturizer. She is using a steroid cream. The rash over the face has improved. She developed a painful scalp rash-improved with clindamycin solution and prednisone. Scalp rash improved.  26. Insomnia, anxiety and restlessness following abrupt discontinuation of Percocet. Resolved after a slow Percocet taper.  27. History of mucositis secondary to chemotherapy.  28. Acute nausea during the irinotecan infusion, lasting for approximately 24 hours. Improved with addition of Ativan to the premedication regimen.  29. Erythema over Port-A-Cath site following application of EMLA cream 11/01/2013-resolved.   Disposition:  Her overall performance status appears stable. She has increased pain, likely secondary to tumor in the liver. We prescribed hydrocodone today.  Natalie Mccarthy has decided to enroll on the panitumumab/cabozantinib study at [facility]. She is scheduled for an appointment at [facility] on 06/29/2014.  She will be seen frequently at [facility] while on study. She will return for an office visit here in approximately one month. We will see her sooner as needed.  Betsy Coder, MD  06/27/2014  2:10 PM

## 2014-06-28 ENCOUNTER — Encounter: Payer: Self-pay | Admitting: Oncology

## 2014-06-28 LAB — CEA: CEA: 270.2 ng/mL — AB (ref 0.0–5.0)

## 2014-06-28 NOTE — Progress Notes (Signed)
See dr notes the patient is going to [facility]--no asst right now

## 2014-07-13 ENCOUNTER — Telehealth: Payer: Self-pay | Admitting: Oncology

## 2014-07-13 ENCOUNTER — Telehealth: Payer: Self-pay | Admitting: *Deleted

## 2014-07-13 NOTE — Telephone Encounter (Signed)
Call from pt requesting to have labs done in this office Q Monday for her study at [facility]. Needs liver functions checked/ faxed to [facility]. She will give order to phlebotomist during lab visit.

## 2014-07-13 NOTE — Telephone Encounter (Signed)
lvm for pt regarding to Sept thru NOV appt.Marland KitchenMarland KitchenMarland Kitchen

## 2014-07-17 ENCOUNTER — Other Ambulatory Visit (HOSPITAL_BASED_OUTPATIENT_CLINIC_OR_DEPARTMENT_OTHER): Payer: BC Managed Care – PPO

## 2014-07-17 ENCOUNTER — Other Ambulatory Visit: Payer: Self-pay | Admitting: *Deleted

## 2014-07-17 DIAGNOSIS — C184 Malignant neoplasm of transverse colon: Secondary | ICD-10-CM

## 2014-07-17 DIAGNOSIS — C189 Malignant neoplasm of colon, unspecified: Secondary | ICD-10-CM

## 2014-07-18 LAB — CEA: CEA: 402.2 ng/mL — AB (ref 0.0–5.0)

## 2014-07-21 ENCOUNTER — Telehealth: Payer: Self-pay | Admitting: Oncology

## 2014-07-21 NOTE — Telephone Encounter (Signed)
cld pt back from VM left-pt stated sch already chgd

## 2014-07-21 NOTE — Telephone Encounter (Signed)
Pt called to r/s labs to later time in the day, r/s lab times pt confirmed...Marland KitchenMarland KitchenKJ

## 2014-07-24 ENCOUNTER — Other Ambulatory Visit: Payer: BC Managed Care – PPO

## 2014-07-26 ENCOUNTER — Telehealth: Payer: Self-pay | Admitting: Oncology

## 2014-07-26 ENCOUNTER — Other Ambulatory Visit: Payer: Self-pay | Admitting: *Deleted

## 2014-07-26 ENCOUNTER — Ambulatory Visit (HOSPITAL_BASED_OUTPATIENT_CLINIC_OR_DEPARTMENT_OTHER): Payer: BC Managed Care – PPO | Admitting: Oncology

## 2014-07-26 VITALS — BP 127/76 | HR 75 | Temp 98.0°F | Resp 18 | Ht 67.0 in | Wt 145.9 lb

## 2014-07-26 DIAGNOSIS — C787 Secondary malignant neoplasm of liver and intrahepatic bile duct: Secondary | ICD-10-CM

## 2014-07-26 DIAGNOSIS — C184 Malignant neoplasm of transverse colon: Secondary | ICD-10-CM

## 2014-07-26 DIAGNOSIS — R109 Unspecified abdominal pain: Secondary | ICD-10-CM

## 2014-07-26 DIAGNOSIS — C189 Malignant neoplasm of colon, unspecified: Secondary | ICD-10-CM

## 2014-07-26 DIAGNOSIS — L270 Generalized skin eruption due to drugs and medicaments taken internally: Secondary | ICD-10-CM

## 2014-07-26 DIAGNOSIS — K5900 Constipation, unspecified: Secondary | ICD-10-CM

## 2014-07-26 NOTE — Progress Notes (Signed)
[facility] OFFICE PROGRESS NOTE   Diagnosis: Colon cancer  INTERVAL HISTORY:   She returns as scheduled. She began treatment on the panitumumab/cabo. study and it 07/05/2014. She received the first dose of the panitumumab 07/19/2014. She had one day of diarrhea 07/25/2014. No nausea. She began to develop a rash over the chest and arms 2 days ago. The scalp rash has improved. She is not taking pain medication.  Objective:  Vital signs in last 24 hours:  Blood pressure 127/76, pulse 75, temperature 98 F (36.7 C), temperature source Oral, resp. rate 18, height 5\' 7"  (1.702 m), weight 145 lb 14.4 oz (66.18 kg).    HEENT: Mild erythema at the left buccal mucosa, no discrete ulcer. No thrush Resp: Lungs clear bilaterally Cardio: Regular rate and rhythm GI: No hepatomegaly, tenderness at the mid right costal margin, no mass Vascular: No leg edema  Skin: Mild acne type rash over the chest and back, no obvious scalp lesions   Portacath/PICC-without erythema  Lab Results:  Liver panel pending    Medications: I have reviewed the patient's current medications.  Assessment/Plan: 1.Metastatic colon cancer, K-ras wild-type, microsatellite stable, no BRAF mutation , no mismatch repair protein mutation on Foundation 1 testing, with multiple liver metastases noted on an MRI of the abdomen 12/02/2010 and on a CT 12/13/2010. A transverse colon mass was noted on the CT from 02/11/2011 and confirmed on a colonoscopy 12/17/2010. She began treatment with FOLFIRI/Avastin 12/31/2010. A restaging CT 06/20/2011 confirmed further improvement in the metastatic liver lesions and no evidence for progressive metastatic disease. A restaging CT 08/01/2011 showed stable liver lesions and no new lesions. Restaging CT 09/12/2011 showed stable liver lesions and no new lesions. A restaging CT 10/27/2011 showed stable liver lesions and no new lesions. Restaging CT 12/05/2011 showed mild decrease in size  of hepatic metastases and no evidence of disease progression. Restaging CT 01/26/2012 showed stable hepatic metastases and no new lesions. Restaging CT may 5/8/ 2013 revealed a decreased size of hepatic metastases and no new lesions. Restaging CT 04/23/2012 revealed no change in the measurable "target" lesions, a slight increase in the size of other lesions, and no new lesions. Restaging CT 06/04/2012 showed multiple hepatic metastases were grossly unchanged. Restaging CT 07/19/2012 with stable disease in the liver. Infusional 5-fluorouracil continued on a 2 week schedule. Treatment was held after the 08/03/2012 cycle due to thrombocytopenia. Restaging CT evaluation 08/30/2012 with mild increase in size of large hepatic metastases. Treatment was resumed with 5 fluorouracil and Avastin on 08/31/2012. Restaging CT 10/14/2012-stable hepatic metastases. Treatment continued with 5 fluorouracil and Avastin. Restaging CT 12/01/2012 revealed mild progression in the size of multiple hepatic lesions. Treatment was placed on hold. CEA 32.1 on 01/06/2013 and 71.9 on 03/11/2013. Restaging CT abdomen/pelvis 03/31/2013 with interval progression of liver metastases. She began cycle 1 CAPOX on 04/15/2013. The Xeloda was prematurely discontinued on 04/25/2013 due to redness and pain over the feet. She completed cycle 2 beginning 05/24/2013 and cycle 3 beginning on 07/05/2013. Disease progression presenting with severe abdominal pain. She began irinotecan/Panitumumab 09/05/2013. CEA markedly improved at 5.0 on 11/15/2013.  Restaging CT 11/25/2013 with improvement in hepatic metastases and abdominal lymphadenopathy.  Irinotecan/Panitumumab continued.  Restaging CT 03/31/2014 with stable to slight improvement in liver metastases.  Irinotecan/Panitumumab continued.  Panitumumab held 05/16/2014 secondary to a severe scalp rash.  Irinotecan continued. Restaging CT 06/09/2012 with progressive metastatic disease in the lungs, lymph  nodes, and liver Initiation of treatment on the panitumumab/cabozantinib study  at [facility] 07/05/2014 2. Abdominal pain-? Secondary to liver metastases, metastatic adenopathy, or the primary colon tumor. Less likely peptic ulcer disease.  3. Anorexia, early satiety, and weight loss secondary to metastatic colon cancer-resolved.  4. Microcytic anemia. Resolved.  5. History of migraine headaches.  6. Status post uterine fibroid surgery.  7. Port-A-Cath placement 12/20/2010.  8. Delayed nausea following cycle 1 of FOLFIRI/Avastin. Improved with the addition of Aloxi and prophylactic Decadron beginning with cycle #2.  9. Neutropenia secondary to chemotherapy. Cycle #4 was held on 02/11/2011. She subsequently received Neulasta support.  10. History of intermittent fever with no apparent source for infection, likely "tumor fever" or fever related to chemotherapy. No recent fever.  11. History of mucositis secondary to 5-FU, initially grade 2. Irinotecan and 5-FU were dose reduced per study guidelines.  12. History of abdominal cramping. ? Related to irinotecan, 5-FU, or potentially the colon tumor. No recent abdominal cramping.  13. Thrombocytopenia secondary to chemotherapy (while being treated with irinotecan and Avastin). Treatment was held for thrombocytopenia per study guidelines. The thrombocytopenia may be related to Avastin, or chronic liver disease from chemotherapy. The CT on 04/23/2012 confirmed mild splenomegaly. The CT on 06/04/2012 again showed splenomegaly. The thrombocytopenia persisted despite being off of irinotecan and Avastin for several months. The thrombocytopenia improved. She developed thrombocytopenia following cycle 1 and cycle 2 CAPOX. She again has thrombocytopenia. Likely related to chronic liver disease and splenomegaly.  24. Arthralgias-most likely unrelated to the colon cancer diagnosis and chemotherapy. She has been evaluated by orthopedics. The etiology of the arthralgias is  unclear.  16. Persistent discomfort at the right shoulder. Negative CT of the right upper extremity on 07/19/2012. MRI on 08/06/2012 with a partial thickness bursal surface tear of the supraspinatus tendon, moderate rotator cuff tendinopathy/tendinosis. She is now followed by orthopedics. The pain has resolved. The range of motion at the right shoulder has improved  17.? Swelling at the right upper extremity and chest wall April 2014-negative right upper extremity Doppler.  18. Delayed nausea following cycle 1 CAPOX. Emend and Aloxi were added with cycle 2.  19. Mucositis and hand-foot syndrome secondary to Xeloda. The Xeloda was dose reduced to 1000 mg twice daily for 14 days with cycle 2.  20. Thrombocytopenia following cycle 1 CAPOX. Cycle 2 and cycle 3 were delayed due to thrombocytopenia. The oxaliplatin was further dose reduced with cycle 3.  21.New onset pruritus 04/04/2013-hyperbilirubinemia and an elevated alkaline phosphatase noted on labs 04/06/2013, the CT 03/31/2013 revealed peripheral biliary ductal dilatation in the medial segment of the left hepatic lobe,no options for intervention after evaluation by Dr. Watt Climes. The hyperbilirubinemia initially resolved and was mildly elevated on 07/26/2013.  22. Acute severe upper abdominal pain, pleuritic, with radiation to the chest/shoulders beginning 07/25/2013-etiology unclear. Chest CT 07/25/2013 showed no evidence of an acute pulmonary embolus and no acute or metastatic findings in the chest. CT abdomen/pelvis on 07/26/2013 showed interval slight progression of metastatic disease to the liver; no new hepatic metastases; slight progression of intrahepatic biliary ductal dilatation; interval marked increase in the size of the spleen; interval increase in the size of a portacaval lymph node and gastrohepatic ligament lymph nodes; contracted thickwalled gallbladder possibly containing sludge. The pain is likely related to metastatic disease involving the  liver with possible irritation of the diaphragm. The pain resolved.  23. Status post biopsy of a liver lesion 08/10/2013. Pathology confirmed metastatic adenocarcinoma. Extended K-ras testing returned negative for the K-ras mutation.  24. Constipation.  25.  Panitumumab skin rash. She is beginning to develop a rash after receiving another dose of panitumumab 07/19/2014.  26. Insomnia, anxiety and restlessness following abrupt discontinuation of Percocet. Resolved after a slow Percocet taper.  27. History of mucositis secondary to chemotherapy.  28. Acute nausea during the irinotecan infusion, lasting for approximately 24 hours. Improved with addition of Ativan to the premedication regimen.  29. Erythema over Port-A-Cath site following application of EMLA cream 11/01/2013-resolved.    Disposition:  She is being treated on the panitumumab/cabo. Study at [facility]. She is being followed closely by Dr. Reynaldo Minium. Ms. Edling will return for weekly labs here. We will see her for an office visit 09/06/2014. She is scheduled to be seen at [facility] later today and will inquire about an influenza vaccine as our supply ran out earlier this week.  Betsy Coder, MD  07/26/2014  12:23 PM

## 2014-07-26 NOTE — Telephone Encounter (Signed)
Pt confirmed labs/ov per 09/23 POF, gave pt AVS....KJ °

## 2014-07-31 ENCOUNTER — Other Ambulatory Visit: Payer: BC Managed Care – PPO

## 2014-07-31 DIAGNOSIS — C189 Malignant neoplasm of colon, unspecified: Secondary | ICD-10-CM

## 2014-07-31 LAB — HEPATIC FUNCTION PANEL
ALT: 80 U/L — ABNORMAL HIGH (ref 0–35)
AST: 107 U/L — ABNORMAL HIGH (ref 0–37)
Albumin: 3.6 g/dL (ref 3.5–5.2)
Alkaline Phosphatase: 420 U/L — ABNORMAL HIGH (ref 39–117)
Bilirubin, Direct: 0.4 mg/dL — ABNORMAL HIGH (ref 0.0–0.3)
Indirect Bilirubin: 0.7 mg/dL (ref 0.2–1.2)
Total Bilirubin: 1.1 mg/dL (ref 0.2–1.2)
Total Protein: 7 g/dL (ref 6.0–8.3)

## 2014-08-04 ENCOUNTER — Other Ambulatory Visit: Payer: BC Managed Care – PPO

## 2014-08-04 DIAGNOSIS — C189 Malignant neoplasm of colon, unspecified: Secondary | ICD-10-CM

## 2014-08-04 LAB — HEPATIC FUNCTION PANEL
ALT: 94 U/L — ABNORMAL HIGH (ref 0–35)
AST: 132 U/L — AB (ref 0–37)
Albumin: 3.5 g/dL (ref 3.5–5.2)
Alkaline Phosphatase: 404 U/L — ABNORMAL HIGH (ref 39–117)
BILIRUBIN DIRECT: 0.4 mg/dL — AB (ref 0.0–0.3)
BILIRUBIN INDIRECT: 0.8 mg/dL (ref 0.2–1.2)
Total Bilirubin: 1.2 mg/dL (ref 0.2–1.2)
Total Protein: 7.4 g/dL (ref 6.0–8.3)

## 2014-08-07 ENCOUNTER — Other Ambulatory Visit: Payer: BC Managed Care – PPO

## 2014-08-14 ENCOUNTER — Other Ambulatory Visit: Payer: BC Managed Care – PPO

## 2014-08-14 DIAGNOSIS — C189 Malignant neoplasm of colon, unspecified: Secondary | ICD-10-CM

## 2014-08-14 LAB — HEPATIC FUNCTION PANEL
ALT: 35 U/L (ref 0–35)
AST: 59 U/L — AB (ref 0–37)
Albumin: 3.2 g/dL — ABNORMAL LOW (ref 3.5–5.2)
Alkaline Phosphatase: 316 U/L — ABNORMAL HIGH (ref 39–117)
BILIRUBIN DIRECT: 0.3 mg/dL (ref 0.0–0.3)
BILIRUBIN TOTAL: 0.9 mg/dL (ref 0.2–1.2)
Indirect Bilirubin: 0.6 mg/dL (ref 0.2–1.2)
Total Protein: 6.8 g/dL (ref 6.0–8.3)

## 2014-08-18 ENCOUNTER — Other Ambulatory Visit: Payer: Self-pay

## 2014-08-21 ENCOUNTER — Other Ambulatory Visit: Payer: BC Managed Care – PPO

## 2014-08-21 ENCOUNTER — Telehealth: Payer: Self-pay | Admitting: Oncology

## 2014-08-21 DIAGNOSIS — C189 Malignant neoplasm of colon, unspecified: Secondary | ICD-10-CM

## 2014-08-21 LAB — HEPATIC FUNCTION PANEL
ALK PHOS: 326 U/L — AB (ref 39–117)
ALT: 37 U/L — AB (ref 0–35)
AST: 79 U/L — ABNORMAL HIGH (ref 0–37)
Albumin: 3.3 g/dL — ABNORMAL LOW (ref 3.5–5.2)
Bilirubin, Direct: 0.2 mg/dL (ref 0.0–0.3)
Indirect Bilirubin: 0.5 mg/dL (ref 0.2–1.2)
Total Bilirubin: 0.7 mg/dL (ref 0.2–1.2)
Total Protein: 7.3 g/dL (ref 6.0–8.3)

## 2014-08-21 NOTE — Telephone Encounter (Signed)
pt came in to cx every other lab per [facility] request....and she wants all labs on wednesdays....gv and printed pt new sched

## 2014-08-28 ENCOUNTER — Other Ambulatory Visit: Payer: BC Managed Care – PPO

## 2014-09-04 ENCOUNTER — Other Ambulatory Visit: Payer: BC Managed Care – PPO

## 2014-09-06 ENCOUNTER — Telehealth: Payer: Self-pay | Admitting: Oncology

## 2014-09-06 ENCOUNTER — Ambulatory Visit (HOSPITAL_BASED_OUTPATIENT_CLINIC_OR_DEPARTMENT_OTHER): Payer: BC Managed Care – PPO | Admitting: Oncology

## 2014-09-06 ENCOUNTER — Other Ambulatory Visit: Payer: BC Managed Care – PPO

## 2014-09-06 VITALS — BP 126/75 | HR 79 | Temp 97.9°F | Resp 18 | Ht 67.0 in | Wt 140.2 lb

## 2014-09-06 DIAGNOSIS — C184 Malignant neoplasm of transverse colon: Secondary | ICD-10-CM

## 2014-09-06 DIAGNOSIS — C189 Malignant neoplasm of colon, unspecified: Secondary | ICD-10-CM

## 2014-09-06 DIAGNOSIS — C787 Secondary malignant neoplasm of liver and intrahepatic bile duct: Secondary | ICD-10-CM

## 2014-09-06 DIAGNOSIS — R109 Unspecified abdominal pain: Secondary | ICD-10-CM

## 2014-09-06 DIAGNOSIS — D696 Thrombocytopenia, unspecified: Secondary | ICD-10-CM

## 2014-09-06 LAB — HEPATIC FUNCTION PANEL
ALK PHOS: 349 U/L — AB (ref 39–117)
ALT: 44 U/L — AB (ref 0–35)
AST: 91 U/L — ABNORMAL HIGH (ref 0–37)
Albumin: 3 g/dL — ABNORMAL LOW (ref 3.5–5.2)
BILIRUBIN DIRECT: 0.3 mg/dL (ref 0.0–0.3)
BILIRUBIN INDIRECT: 0.7 mg/dL (ref 0.2–1.2)
Total Bilirubin: 1 mg/dL (ref 0.2–1.2)
Total Protein: 6.9 g/dL (ref 6.0–8.3)

## 2014-09-06 MED ORDER — HYDROCODONE-ACETAMINOPHEN 5-325 MG PO TABS: 1.0000 | ORAL_TABLET | ORAL | Status: DC | PRN

## 2014-09-06 NOTE — Progress Notes (Signed)
[facility] OFFICE PROGRESS NOTE   Diagnosis: colon cancer  INTERVAL HISTORY:   She returns as scheduled. She continues treatment on the panitumumab/Cabo study at [facility]. She has a rash, chiefly over the face. She is using a topical steroid cream and remains on minocycline. She complains of intermittent nausea, cramping abdominal pain, and frequent loose bowel movements. Poor appetite. She continues to work. Intermittent pleuritic pain at the right upper lateral abdomen.  Objective:  Vital signs in last 24 hours:  Blood pressure 126/75, pulse 79, temperature 97.9 F (36.6 C), temperature source Oral, resp. rate 18, height $RemoveBe'5\' 7"'[id number]$  (1.702 m), weight 140 lb 3.2 oz (63.594 kg), SpO2 100 %.    HEENT: no thrush,a few 2-3 mm erythematous areas at the buccal mucosa and palate. Ulcerations at the lower lip Resp: lungs clear bilaterally Cardio: regular rate and rhythm GI: fullness at the right medial costal margin without a discrete liver edge, the abdomen is soft Vascular: no leg edema  Skin:acne type rash over the face with a few erythematous lesions over the abdomen, linear ulcerations at several of the fingertips   Portacath/PICC-without erythema  Lab Results:  Liver panel pending  Medications: I have reviewed the patient's current medications.  Assessment/Plan: 1.Metastatic colon cancer, K-ras wild-type, microsatellite stable, no BRAF mutation , no mismatch repair protein mutation on Foundation 1 testing, with multiple liver metastases noted on an MRI of the abdomen 12/02/2010 and on a CT 12/13/2010. A transverse colon mass was noted on the CT from 02/11/2011 and confirmed on a colonoscopy 12/17/2010. She began treatment with FOLFIRI/Avastin 12/31/2010. A restaging CT 06/20/2011 confirmed further improvement in the metastatic liver lesions and no evidence for progressive metastatic disease. A restaging CT 08/01/2011 showed stable liver lesions and no new lesions. Restaging CT  09/12/2011 showed stable liver lesions and no new lesions. A restaging CT 10/27/2011 showed stable liver lesions and no new lesions. Restaging CT 12/05/2011 showed mild decrease in size of hepatic metastases and no evidence of disease progression. Restaging CT 01/26/2012 showed stable hepatic metastases and no new lesions. Restaging CT may 5/8/ 2013 revealed a decreased size of hepatic metastases and no new lesions. Restaging CT 04/23/2012 revealed no change in the measurable "target" lesions, a slight increase in the size of other lesions, and no new lesions. Restaging CT 06/04/2012 showed multiple hepatic metastases were grossly unchanged. Restaging CT 07/19/2012 with stable disease in the liver. Infusional 5-fluorouracil continued on a 2 week schedule. Treatment was held after the 08/03/2012 cycle due to thrombocytopenia. Restaging CT evaluation 08/30/2012 with mild increase in size of large hepatic metastases. Treatment was resumed with 5 fluorouracil and Avastin on 08/31/2012. Restaging CT 10/14/2012-stable hepatic metastases. Treatment continued with 5 fluorouracil and Avastin. Restaging CT 12/01/2012 revealed mild progression in the size of multiple hepatic lesions. Treatment was placed on hold. CEA 32.1 on 01/06/2013 and 71.9 on 03/11/2013. Restaging CT abdomen/pelvis 03/31/2013 with interval progression of liver metastases. She began cycle 1 CAPOX on 04/15/2013. The Xeloda was prematurely discontinued on 04/25/2013 due to redness and pain over the feet. She completed cycle 2 beginning 05/24/2013 and cycle 3 beginning on 07/05/2013. Disease progression presenting with severe abdominal pain. She began irinotecan/Panitumumab 09/05/2013. CEA markedly improved at 5.0 on 11/15/2013.   Restaging CT 11/25/2013 with improvement in hepatic metastases and abdominal lymphadenopathy.   Irinotecan/Panitumumab continued.   Restaging CT 03/31/2014 with stable to slight improvement in liver metastases.    Irinotecan/Panitumumab continued.   Panitumumab held 05/16/2014 secondary to a  severe scalp rash.   Irinotecan continued.  Restaging CT 06/09/2012 with progressive metastatic disease in the lungs, lymph nodes, and liver  Initiation of treatment on the panitumumab/cabozantinib study at [facility] 07/05/2014 2. Abdominal pain-? Secondary to liver metastases, metastatic adenopathy, or the primary colon tumor. Less likely peptic ulcer disease.  3. Anorexia, early satiety, and weight loss secondary to metastatic colon cancer-resolved.  4. Microcytic anemia. Resolved.  5. History of migraine headaches.  6. Status post uterine fibroid surgery.  7. Port-A-Cath placement 12/20/2010.  8. Delayed nausea following cycle 1 of FOLFIRI/Avastin. Improved with the addition of Aloxi and prophylactic Decadron beginning with cycle #2.  9. Neutropenia secondary to chemotherapy. Cycle #4 was held on 02/11/2011. She subsequently received Neulasta support.  10. History of intermittent fever with no apparent source for infection, likely "tumor fever" or fever related to chemotherapy. No recent fever.  11. History of mucositis secondary to 5-FU, initially grade 2. Irinotecan and 5-FU were dose reduced per study guidelines.  12. History of abdominal cramping. ? Related to irinotecan, 5-FU, or potentially the colon tumor. No recent abdominal cramping.  13. Thrombocytopenia secondary to chemotherapy (while being treated with irinotecan and Avastin). Treatment was held for thrombocytopenia per study guidelines. The thrombocytopenia may be related to Avastin, or chronic liver disease from chemotherapy. The CT on 04/23/2012 confirmed mild splenomegaly. The CT on 06/04/2012 again showed splenomegaly. The thrombocytopenia persisted despite being off of irinotecan and Avastin for several months. The thrombocytopenia improved. She developed thrombocytopenia following cycle 1 and cycle 2 CAPOX. She again has  thrombocytopenia. Likely related to chronic liver disease and splenomegaly.  79. Arthralgias-most likely unrelated to the colon cancer diagnosis and chemotherapy. She has been evaluated by orthopedics. The etiology of the arthralgias is unclear.  16. Persistent discomfort at the right shoulder. Negative CT of the right upper extremity on 07/19/2012. MRI on 08/06/2012 with a partial thickness bursal surface tear of the supraspinatus tendon, moderate rotator cuff tendinopathy/tendinosis. She is now followed by orthopedics. The pain has resolved. The range of motion at the right shoulder has improved  17.? Swelling at the right upper extremity and chest wall April 2014-negative right upper extremity Doppler.  18. Delayed nausea following cycle 1 CAPOX. Emend and Aloxi were added with cycle 2.  19. Mucositis and hand-foot syndrome secondary to Xeloda. The Xeloda was dose reduced to 1000 mg twice daily for 14 days with cycle 2.  20. Thrombocytopenia following cycle 1 CAPOX. Cycle 2 and cycle 3 were delayed due to thrombocytopenia. The oxaliplatin was further dose reduced with cycle 3.  21.New onset pruritus 04/04/2013-hyperbilirubinemia and an elevated alkaline phosphatase noted on labs 04/06/2013, the CT 03/31/2013 revealed peripheral biliary ductal dilatation in the medial segment of the left hepatic lobe,no options for intervention after evaluation by Dr. Watt Climes. The hyperbilirubinemia initially resolved and was mildly elevated on 07/26/2013.  22. Acute severe upper abdominal pain, pleuritic, with radiation to the chest/shoulders beginning 07/25/2013-etiology unclear. Chest CT 07/25/2013 showed no evidence of an acute pulmonary embolus and no acute or metastatic findings in the chest. CT abdomen/pelvis on 07/26/2013 showed interval slight progression of metastatic disease to the liver; no new hepatic metastases; slight progression of intrahepatic biliary ductal dilatation; interval marked increase in  the size of the spleen; interval increase in the size of a portacaval lymph node and gastrohepatic ligament lymph nodes; contracted thickwalled gallbladder possibly containing sludge. The pain is likely related to metastatic disease involving the liver with possible irritation of the diaphragm. The  pain resolved.  23. Status post biopsy of a liver lesion 08/10/2013. Pathology confirmed metastatic adenocarcinoma. Extended K-ras testing returned negative for the K-ras mutation.  24. Constipation.  25. Panitumumab skin rash, with ulcerations, and paronychia. She continues minocycline and topical steroid creams 26. Insomnia, anxiety and restlessness following abrupt discontinuation of Percocet. Resolved after a slow Percocet taper.  27. History of mucositis secondary to chemotherapy.  28. Acute nausea during the irinotecan infusion, lasting for approximately 24 hours. Improved with addition of Ativan to the premedication regimen.  29. Erythema over Port-A-Cath site following application of EMLA cream 11/01/2013-resolved.   Disposition:  She has developed skin and GI toxicity with the systemic therapy. She will continue supportive medications. Natalie Mccarthy is scheduled for a restaging evaluation at [facility] next week. We are available to see her as needed. She will return for an office visit here 10/18/2014.  Betsy Coder, MD  09/06/2014  9:30 AM

## 2014-09-06 NOTE — Telephone Encounter (Signed)
gv adn printed appt sched and avs for pt for NOV and Dec °

## 2014-09-11 ENCOUNTER — Other Ambulatory Visit: Payer: BC Managed Care – PPO

## 2014-09-14 ENCOUNTER — Telehealth: Payer: Self-pay | Admitting: Oncology

## 2014-09-14 ENCOUNTER — Other Ambulatory Visit: Payer: Self-pay | Admitting: *Deleted

## 2014-09-14 NOTE — Telephone Encounter (Signed)
Pt called states [facility] MD wants pt to come in and see MD sooner than Dec apt due to procedure not working...Marland KitchenMarland KitchenMarland Kitchen KJ

## 2014-09-15 ENCOUNTER — Telehealth: Payer: Self-pay | Admitting: *Deleted

## 2014-09-15 NOTE — Telephone Encounter (Signed)
Left message on voicemail offering pt appt 11/17 at 1030. She was given 11/24 when she spoke to schedulers yesterday.

## 2014-09-15 NOTE — Telephone Encounter (Signed)
Pt returned call, she would like sooner appt. Order sent to schedulers to move up to 11/17.

## 2014-09-18 ENCOUNTER — Other Ambulatory Visit: Payer: BC Managed Care – PPO

## 2014-09-18 ENCOUNTER — Telehealth: Payer: Self-pay | Admitting: Oncology

## 2014-09-18 NOTE — Telephone Encounter (Signed)
s.w. pt husband and advised on 11.17 appt...ok and aware

## 2014-09-19 ENCOUNTER — Other Ambulatory Visit: Payer: Self-pay | Admitting: *Deleted

## 2014-09-19 ENCOUNTER — Ambulatory Visit (HOSPITAL_BASED_OUTPATIENT_CLINIC_OR_DEPARTMENT_OTHER): Payer: BC Managed Care – PPO | Admitting: Oncology

## 2014-09-19 ENCOUNTER — Telehealth: Payer: Self-pay | Admitting: Oncology

## 2014-09-19 VITALS — BP 127/64 | HR 99 | Temp 98.1°F | Resp 18 | Ht 67.0 in | Wt 140.4 lb

## 2014-09-19 DIAGNOSIS — C184 Malignant neoplasm of transverse colon: Secondary | ICD-10-CM

## 2014-09-19 DIAGNOSIS — C7800 Secondary malignant neoplasm of unspecified lung: Secondary | ICD-10-CM

## 2014-09-19 DIAGNOSIS — C189 Malignant neoplasm of colon, unspecified: Secondary | ICD-10-CM

## 2014-09-19 DIAGNOSIS — C787 Secondary malignant neoplasm of liver and intrahepatic bile duct: Secondary | ICD-10-CM

## 2014-09-19 MED ORDER — HYDROCODONE-ACETAMINOPHEN 5-325 MG PO TABS: 1.0000 | ORAL_TABLET | ORAL | Status: DC | PRN

## 2014-09-19 MED ORDER — OXYCODONE HCL ER 10 MG PO T12A: 10.0000 mg | EXTENDED_RELEASE_TABLET | Freq: Two times a day (BID) | ORAL | Status: DC

## 2014-09-19 MED ORDER — TRIFLURIDINE-TIPIRACIL 15-6.14 MG PO TABS: 45.0000 mg | ORAL_TABLET | Freq: Every day | ORAL | Status: DC

## 2014-09-19 MED ORDER — OXYCODONE-ACETAMINOPHEN 5-325 MG PO TABS: 1.0000 | ORAL_TABLET | ORAL | Status: AC | PRN

## 2014-09-19 NOTE — Telephone Encounter (Signed)
Pt confirmed labs/ov per 11/17 POF, gave pt AVS..... KJ °

## 2014-09-19 NOTE — Progress Notes (Signed)
[place] OFFICE PROGRESS NOTE   Diagnosis: Colon cancer  INTERVAL HISTORY:   Natalie Mccarthy underwent a restaging CT evaluation at [facility] last week. There is evidence of disease progression in the lungs and liver. She was taken off of protocol therapy.  She complains of increased pain in the right upper abdomen and right shoulder. The pain was not relieved with hydrocodone. She has been using Percocet frequently. She also has increased and nausea. Her appetite has partially improved since discontinuing cabozantinib.    Objective:  Vital signs in last 24 hours:  Blood pressure 127/64, pulse 99, temperature 98.1 F (36.7 C), temperature source Oral, resp. rate 18, height $RemoveBe'5\' 7"'ZkHDPAvqd$  (1.702 m), weight 140 lb 6.4 oz (63.685 kg), SpO2 99 %.    HEENT: No thrush or ulcers Resp: lungs clear bilaterally Cardio: regular rate and rhythm GI: the liver is palpable in the mid upper abdomen with associated tenderness, no splenomegaly Vascular: no leg edema  Skin:mild acne type rash over the face   Portacath/PICC-without erythema  Lab Results:   Medications: I have reviewed the patient's current medications.  Assessment/Plan: 1.Metastatic colon cancer, K-ras wild-type, microsatellite stable, no BRAF mutation , no mismatch repair protein mutation on [facility] 1 testing, with multiple liver metastases noted on an MRI of the abdomen 12/02/2010 and on a CT 12/13/2010. A transverse colon mass was noted on the CT from 02/11/2011 and confirmed on a colonoscopy 12/17/2010. She began treatment with FOLFIRI/Avastin 12/31/2010. A restaging CT 06/20/2011 confirmed further improvement in the metastatic liver lesions and no evidence for progressive metastatic disease. A restaging CT 08/01/2011 showed stable liver lesions and no new lesions. Restaging CT 09/12/2011 showed stable liver lesions and no new lesions. A restaging CT 10/27/2011 showed stable liver lesions and no new lesions. Restaging CT  12/05/2011 showed mild decrease in size of hepatic metastases and no evidence of disease progression. Restaging CT 01/26/2012 showed stable hepatic metastases and no new lesions. Restaging CT may 5/8/ 2013 revealed a decreased size of hepatic metastases and no new lesions. Restaging CT 04/23/2012 revealed no change in the measurable "target" lesions, a slight increase in the size of other lesions, and no new lesions. Restaging CT 06/04/2012 showed multiple hepatic metastases were grossly unchanged. Restaging CT 07/19/2012 with stable disease in the liver. Infusional 5-fluorouracil continued on a 2 week schedule. Treatment was held after the 08/03/2012 cycle due to thrombocytopenia. Restaging CT evaluation 08/30/2012 with mild increase in size of large hepatic metastases. Treatment was resumed with 5 fluorouracil and Avastin on 08/31/2012. Restaging CT 10/14/2012-stable hepatic metastases. Treatment continued with 5 fluorouracil and Avastin. Restaging CT 12/01/2012 revealed mild progression in the size of multiple hepatic lesions. Treatment was placed on hold. CEA 32.1 on 01/06/2013 and 71.9 on 03/11/2013. Restaging CT abdomen/pelvis 03/31/2013 with interval progression of liver metastases. She began cycle 1 CAPOX on 04/15/2013. The Xeloda was prematurely discontinued on 04/25/2013 due to redness and pain over the feet. She completed cycle 2 beginning 05/24/2013 and cycle 3 beginning on 07/05/2013. Disease progression presenting with severe abdominal pain. She began irinotecan/Panitumumab 09/05/2013. CEA markedly improved at 5.0 on 11/15/2013.   Restaging CT 11/25/2013 with improvement in hepatic metastases and abdominal lymphadenopathy.   Irinotecan/Panitumumab continued.   Restaging CT 03/31/2014 with stable to slight improvement in liver metastases.   Irinotecan/Panitumumab continued.   Panitumumab held 05/16/2014 secondary to a severe scalp rash.   Irinotecan continued.  Restaging CT  06/09/2012 with progressive metastatic disease in the lungs, lymph nodes, and  liver  Initiation of treatment on the panitumumab/cabozantinib study at [facility] 07/05/2014  Restaging CT at [facility] 09/13/2014 revealed progression of metastatic disease in the lungs and liver   cabozantinib and panitumumab discontinued 09/13/2014 2. Abdominal pain-progressive, secondary to liver metastases  3. Anorexia, early satiety, and weight loss secondary to metastatic colon cancerand chemotherapy 4. Microcytic anemia. Resolved.  5. History of migraine headaches.  6. Status post uterine fibroid surgery.  7. Port-A-Cath placement 12/20/2010.  8. Delayed nausea following cycle 1 of FOLFIRI/Avastin. Improved with the addition of Aloxi and prophylactic Decadron beginning with cycle #2.  9. Neutropenia secondary to chemotherapy. Cycle #4 was held on 02/11/2011. She subsequently received Neulasta support.  10. History of intermittent fever with no apparent source for infection, likely "tumor fever" or fever related to chemotherapy. No recent fever.  11. History of mucositis secondary to 5-FU, initially grade 2. Irinotecan and 5-FU were dose reduced per study guidelines.  12. History of abdominal cramping. ? Related to irinotecan, 5-FU, or potentially the colon tumor. No recent abdominal cramping.  13. Thrombocytopenia secondary to chemotherapy (while being treated with irinotecan and Avastin). Treatment was held for thrombocytopenia per study guidelines. The thrombocytopenia may be related to Avastin, or chronic liver disease from chemotherapy. The CT on 04/23/2012 confirmed mild splenomegaly. The CT on 06/04/2012 again showed splenomegaly. The thrombocytopenia persisted despite being off of irinotecan and Avastin for several months. The thrombocytopenia improved. She developed thrombocytopenia following cycle 1 and cycle 2 CAPOX. She again has thrombocytopenia. Likely related to chronic liver disease and  splenomegaly.  65. Arthralgias-most likely unrelated to the colon cancer diagnosis and chemotherapy. She has been evaluated by orthopedics. The etiology of the arthralgias is unclear.  16. Persistent discomfort at the right shoulder. Negative CT of the right upper extremity on 07/19/2012. MRI on 08/06/2012 with a partial thickness bursal surface tear of the supraspinatus tendon, moderate rotator cuff tendinopathy/tendinosis. She is now followed by orthopedics. The pain has resolved. The range of motion at the right shoulder has improved  17.? Swelling at the right upper extremity and chest wall April 2014-negative right upper extremity Doppler.  18. Delayed nausea following cycle 1 CAPOX. Emend and Aloxi were added with cycle 2.  19. Mucositis and hand-foot syndrome secondary to Xeloda. The Xeloda was dose reduced to 1000 mg twice daily for 14 days with cycle 2.  20. Thrombocytopenia following cycle 1 CAPOX. Cycle 2 and cycle 3 were delayed due to thrombocytopenia. The oxaliplatin was further dose reduced with cycle 3.  21.New onset pruritus 04/04/2013-hyperbilirubinemia and an elevated alkaline phosphatase noted on labs 04/06/2013, the CT 03/31/2013 revealed peripheral biliary ductal dilatation in the medial segment of the left hepatic lobe,no options for intervention after evaluation by Dr. Watt Climes. The hyperbilirubinemia initially resolved and was mildly elevated on 07/26/2013.  22. Acute severe upper abdominal pain, pleuritic, with radiation to the chest/shoulders beginning 07/25/2013-etiology unclear. Chest CT 07/25/2013 showed no evidence of an acute pulmonary embolus and no acute or metastatic findings in the chest. CT abdomen/pelvis on 07/26/2013 showed interval slight progression of metastatic disease to the liver; no new hepatic metastases; slight progression of intrahepatic biliary ductal dilatation; interval marked increase in the size of the spleen; interval increase in the size of a  portacaval lymph node and gastrohepatic ligament lymph nodes; contracted thickwalled gallbladder possibly containing sludge. The pain is likely related to metastatic disease involving the liver with possible irritation of the diaphragm. The pain resolved.  23. Status post biopsy of a liver  lesion 08/10/2013. Pathology confirmed metastatic adenocarcinoma. Extended K-ras testing returned negative for the K-ras mutation.  24. Constipation.  25. Panitumumab skin rash, with ulcerations, and paronychia. She continues minocycline and topical steroid creams, improved 26. Insomnia, anxiety and restlessness following abrupt discontinuation of Percocet. Resolved after a slow Percocet taper.  27. History of mucositis secondary to chemotherapy.  28. Acute nausea during the irinotecan infusion, lasting for approximately 24 hours. Improved with addition of Ativan to the premedication regimen.  29. Erythema over Port-A-Cath site following application of EMLA cream 11/01/2013-resolved.     Disposition:  There is clinical and radiologic evidence of disease progression. She discontinued study treatment last week. Natalie Mccarthy reviewed treatment options with Dr. Reynaldo Minium on 09/13/2014. We discussed options again today. We discussed Lonsurf and reforafenib. She will begin a trial of Lonsurf. We reviewed the potential toxicities associated with this agent and she was given reading materials. She understands potential for nausea, diarrhea, and hematologic toxicity. The plan is to begin treatment with Lonsurf on 09/25/2014. She will return for an office visit and nadir CBC 10/10/2014. We will check a baseline CEA and CBC on 09/25/2014.  Natalie Mccarthy now has increased pain secondary to liver metastases. We added OxyContin and she will continue Percocet as needed. She will continue Zofran and Ativan for nausea.  Approximate 40 minutes were spent with the patient today. The majority of the time was used for counseling  and coordination of care.  Betsy Coder, MD  09/19/2014  11:00 AM

## 2014-09-20 ENCOUNTER — Telehealth: Payer: Self-pay | Admitting: *Deleted

## 2014-09-20 ENCOUNTER — Other Ambulatory Visit: Payer: BC Managed Care – PPO

## 2014-09-20 NOTE — Telephone Encounter (Signed)
Pt called reports," Co-pay for new drug Lonsurf will be $100 each month and [facility] pharmacy suggested to call MD to see if he has any coupons for this"  Per Dr. Benay Spice; notified pt that manage care department has been notified of request for assistance and that MD does not have any coupons at this time for new chemo med.  Pt verbalized understanding and awaits further information from managed care dept.

## 2014-09-22 ENCOUNTER — Encounter: Payer: Self-pay | Admitting: Oncology

## 2014-09-22 ENCOUNTER — Other Ambulatory Visit: Payer: Self-pay | Admitting: *Deleted

## 2014-09-22 MED ORDER — TRIFLURIDINE-TIPIRACIL 15-6.14 MG PO TABS: ORAL_TABLET | ORAL | Status: AC

## 2014-09-22 NOTE — Progress Notes (Signed)
Called and left a message for patient to call me back about poss asst with Lonsurf. Will advise of 400.00 grant.

## 2014-09-25 ENCOUNTER — Other Ambulatory Visit: Payer: BC Managed Care – PPO

## 2014-09-25 ENCOUNTER — Telehealth: Payer: Self-pay | Admitting: *Deleted

## 2014-09-25 ENCOUNTER — Other Ambulatory Visit (HOSPITAL_BASED_OUTPATIENT_CLINIC_OR_DEPARTMENT_OTHER): Payer: BC Managed Care – PPO

## 2014-09-25 ENCOUNTER — Telehealth: Payer: Self-pay | Admitting: Oncology

## 2014-09-25 ENCOUNTER — Other Ambulatory Visit: Payer: Self-pay | Admitting: Oncology

## 2014-09-25 ENCOUNTER — Encounter: Payer: Self-pay | Admitting: Oncology

## 2014-09-25 DIAGNOSIS — C189 Malignant neoplasm of colon, unspecified: Secondary | ICD-10-CM

## 2014-09-25 DIAGNOSIS — C7800 Secondary malignant neoplasm of unspecified lung: Secondary | ICD-10-CM

## 2014-09-25 DIAGNOSIS — C787 Secondary malignant neoplasm of liver and intrahepatic bile duct: Secondary | ICD-10-CM

## 2014-09-25 DIAGNOSIS — C184 Malignant neoplasm of transverse colon: Secondary | ICD-10-CM

## 2014-09-25 LAB — CBC WITH DIFFERENTIAL/PLATELET
BASO%: 0.7 % (ref 0.0–2.0)
Basophils Absolute: 0 10*3/uL (ref 0.0–0.1)
EOS%: 9.7 % — ABNORMAL HIGH (ref 0.0–7.0)
Eosinophils Absolute: 0.2 10*3/uL (ref 0.0–0.5)
HEMATOCRIT: 34.2 % — AB (ref 34.8–46.6)
HGB: 11 g/dL — ABNORMAL LOW (ref 11.6–15.9)
LYMPH%: 12.7 % — AB (ref 14.0–49.7)
MCH: 26.5 pg (ref 25.1–34.0)
MCHC: 32.1 g/dL (ref 31.5–36.0)
MCV: 82.6 fL (ref 79.5–101.0)
MONO#: 0.4 10*3/uL (ref 0.1–0.9)
MONO%: 16.9 % — AB (ref 0.0–14.0)
NEUT#: 1.4 10*3/uL — ABNORMAL LOW (ref 1.5–6.5)
NEUT%: 60 % (ref 38.4–76.8)
PLATELETS: 125 10*3/uL — AB (ref 145–400)
RBC: 4.15 10*6/uL (ref 3.70–5.45)
RDW: 25.7 % — ABNORMAL HIGH (ref 11.2–14.5)
WBC: 2.4 10*3/uL — ABNORMAL LOW (ref 3.9–10.3)
lymph#: 0.3 10*3/uL — ABNORMAL LOW (ref 0.9–3.3)

## 2014-09-25 LAB — COMPREHENSIVE METABOLIC PANEL (CC13)
ALK PHOS: 419 U/L — AB (ref 40–150)
ALT: 21 U/L (ref 0–55)
ANION GAP: 9 meq/L (ref 3–11)
AST: 78 U/L — ABNORMAL HIGH (ref 5–34)
Albumin: 2.7 g/dL — ABNORMAL LOW (ref 3.5–5.0)
BILIRUBIN TOTAL: 0.89 mg/dL (ref 0.20–1.20)
BUN: 6.2 mg/dL — ABNORMAL LOW (ref 7.0–26.0)
CO2: 28 mEq/L (ref 22–29)
CREATININE: 0.6 mg/dL (ref 0.6–1.1)
Calcium: 9 mg/dL (ref 8.4–10.4)
Chloride: 102 mEq/L (ref 98–109)
Glucose: 111 mg/dl (ref 70–140)
Potassium: 4 mEq/L (ref 3.5–5.1)
SODIUM: 139 meq/L (ref 136–145)
TOTAL PROTEIN: 7 g/dL (ref 6.4–8.3)

## 2014-09-25 NOTE — Progress Notes (Signed)
Patient to bring back bank statement for 400.00 grant to help with meds. She thinks maybe overqualified. I advised would check for her.

## 2014-09-25 NOTE — Telephone Encounter (Signed)
S/w pt confirming labs per 11/23 POF,

## 2014-09-25 NOTE — Telephone Encounter (Signed)
Pt called asking "The oxycodone 10 mg every 12 hours for pain makes me feel like a Zombie; sometimes still have to take the percocet; I want to have a life; what to do?"  Also pt is asking about starting her Lonsurf med today "I missed taking at breakfast; do I start tomorrow thru Saturday?"  Per Dr. Benay Spice; notified pt's husband (pt asleep) that MD wants her to hold the [facility] for now d/t counts being low and re-check next week.  Also instructions given to stop the Oxycontin and continue using the percocet as needed; call if pt requires further pain management needs.  Pt's husband verbalized understanding of instructions.

## 2014-09-25 NOTE — Addendum Note (Signed)
Addended by: Domenic Schwab on: 09/25/2014 02:35 PM   Modules accepted: Orders

## 2014-09-26 ENCOUNTER — Ambulatory Visit: Payer: BC Managed Care – PPO | Admitting: Oncology

## 2014-09-26 LAB — CEA: CEA: 416.4 ng/mL — AB (ref 0.0–5.0)

## 2014-09-27 ENCOUNTER — Other Ambulatory Visit: Payer: Self-pay | Admitting: *Deleted

## 2014-09-27 MED ORDER — LORAZEPAM 1 MG PO TABS: ORAL_TABLET | ORAL | Status: AC

## 2014-09-27 NOTE — Telephone Encounter (Signed)
Confirmed with pharmacy that her last refill of Ativan was 09/05/2013 (not 2015). Approved refill.

## 2014-09-27 NOTE — Telephone Encounter (Signed)
Called husband that refill is ready. Asking about being around small children for Thanksgiving. Made her aware that WBC is low, but she should be OK in same room-try to limit direct contact.

## 2014-10-02 ENCOUNTER — Other Ambulatory Visit (HOSPITAL_BASED_OUTPATIENT_CLINIC_OR_DEPARTMENT_OTHER): Payer: BC Managed Care – PPO

## 2014-10-02 ENCOUNTER — Telehealth: Payer: Self-pay | Admitting: *Deleted

## 2014-10-02 ENCOUNTER — Other Ambulatory Visit: Payer: BC Managed Care – PPO

## 2014-10-02 ENCOUNTER — Ambulatory Visit (HOSPITAL_BASED_OUTPATIENT_CLINIC_OR_DEPARTMENT_OTHER): Payer: BC Managed Care – PPO | Admitting: Nurse Practitioner

## 2014-10-02 ENCOUNTER — Encounter: Payer: Self-pay | Admitting: *Deleted

## 2014-10-02 ENCOUNTER — Encounter: Payer: Self-pay | Admitting: Nurse Practitioner

## 2014-10-02 ENCOUNTER — Ambulatory Visit (HOSPITAL_BASED_OUTPATIENT_CLINIC_OR_DEPARTMENT_OTHER): Payer: BC Managed Care – PPO

## 2014-10-02 VITALS — BP 123/74 | HR 93

## 2014-10-02 VITALS — BP 129/59 | HR 91 | Temp 97.7°F | Resp 17 | Ht 67.0 in | Wt 141.4 lb

## 2014-10-02 DIAGNOSIS — C189 Malignant neoplasm of colon, unspecified: Secondary | ICD-10-CM

## 2014-10-02 DIAGNOSIS — C787 Secondary malignant neoplasm of liver and intrahepatic bile duct: Secondary | ICD-10-CM

## 2014-10-02 DIAGNOSIS — C184 Malignant neoplasm of transverse colon: Secondary | ICD-10-CM

## 2014-10-02 DIAGNOSIS — G893 Neoplasm related pain (acute) (chronic): Secondary | ICD-10-CM

## 2014-10-02 DIAGNOSIS — E860 Dehydration: Secondary | ICD-10-CM

## 2014-10-02 DIAGNOSIS — K5900 Constipation, unspecified: Secondary | ICD-10-CM | POA: Insufficient documentation

## 2014-10-02 DIAGNOSIS — R112 Nausea with vomiting, unspecified: Secondary | ICD-10-CM | POA: Insufficient documentation

## 2014-10-02 DIAGNOSIS — R630 Anorexia: Secondary | ICD-10-CM | POA: Insufficient documentation

## 2014-10-02 DIAGNOSIS — C7800 Secondary malignant neoplasm of unspecified lung: Secondary | ICD-10-CM

## 2014-10-02 LAB — CBC WITH DIFFERENTIAL/PLATELET
BASO%: 0.3 % (ref 0.0–2.0)
BASOS ABS: 0 10*3/uL (ref 0.0–0.1)
EOS%: 3.4 % (ref 0.0–7.0)
Eosinophils Absolute: 0.1 10*3/uL (ref 0.0–0.5)
HEMATOCRIT: 33.9 % — AB (ref 34.8–46.6)
HEMOGLOBIN: 11.1 g/dL — AB (ref 11.6–15.9)
LYMPH#: 0.3 10*3/uL — AB (ref 0.9–3.3)
LYMPH%: 6.5 % — ABNORMAL LOW (ref 14.0–49.7)
MCH: 27.1 pg (ref 25.1–34.0)
MCHC: 32.7 g/dL (ref 31.5–36.0)
MCV: 82.9 fL (ref 79.5–101.0)
MONO#: 0.6 10*3/uL (ref 0.1–0.9)
MONO%: 15.6 % — AB (ref 0.0–14.0)
NEUT#: 2.9 10*3/uL (ref 1.5–6.5)
NEUT%: 74.2 % (ref 38.4–76.8)
PLATELETS: 145 10*3/uL (ref 145–400)
RBC: 4.09 10*6/uL (ref 3.70–5.45)
RDW: 22.1 % — ABNORMAL HIGH (ref 11.2–14.5)
WBC: 3.8 10*3/uL — ABNORMAL LOW (ref 3.9–10.3)

## 2014-10-02 LAB — HEPATIC FUNCTION PANEL
ALK PHOS: 454 U/L — AB (ref 39–117)
ALT: 19 U/L (ref 0–35)
AST: 86 U/L — AB (ref 0–37)
Albumin: 2.8 g/dL — ABNORMAL LOW (ref 3.5–5.2)
BILIRUBIN INDIRECT: 0.8 mg/dL (ref 0.2–1.2)
BILIRUBIN TOTAL: 1.3 mg/dL — AB (ref 0.3–1.2)
Bilirubin, Direct: 0.5 mg/dL — ABNORMAL HIGH (ref 0.0–0.3)
Total Protein: 7.8 g/dL (ref 6.0–8.3)

## 2014-10-02 LAB — BASIC METABOLIC PANEL
BUN: 10 mg/dL (ref 6–23)
CALCIUM: 9.2 mg/dL (ref 8.4–10.5)
CHLORIDE: 92 meq/L — AB (ref 96–112)
CO2: 24 mEq/L (ref 19–32)
CREATININE: 0.55 mg/dL (ref 0.50–1.10)
Glucose, Bld: 118 mg/dL — ABNORMAL HIGH (ref 70–99)
Potassium: 3.9 mEq/L (ref 3.5–5.3)
Sodium: 130 mEq/L — ABNORMAL LOW (ref 135–145)

## 2014-10-02 MED ORDER — KETOROLAC TROMETHAMINE 30 MG/ML IJ SOLN
INTRAMUSCULAR | Status: AC
  Filled 2014-10-02: qty 1

## 2014-10-02 MED ORDER — HEPARIN SOD (PORK) LOCK FLUSH 100 UNIT/ML IV SOLN
500.0000 [IU] | Freq: Once | INTRAVENOUS | Status: AC
  Administered 2014-10-02: 500 [IU] via INTRAVENOUS
  Filled 2014-10-02: qty 5

## 2014-10-02 MED ORDER — MORPHINE SULFATE 4 MG/ML IJ SOLN
2.0000 mg | Freq: Once | INTRAMUSCULAR | Status: AC
  Administered 2014-10-02: 2 mg via INTRAVENOUS

## 2014-10-02 MED ORDER — SODIUM CHLORIDE 0.9 % IJ SOLN
10.0000 mL | INTRAMUSCULAR | Status: DC | PRN
  Administered 2014-10-02: 10 mL via INTRAVENOUS
  Filled 2014-10-02: qty 10

## 2014-10-02 MED ORDER — FENTANYL 25 MCG/HR TD PT72: 25.0000 ug | MEDICATED_PATCH | TRANSDERMAL | Status: DC

## 2014-10-02 MED ORDER — MORPHINE SULFATE 4 MG/ML IJ SOLN
INTRAMUSCULAR | Status: AC
  Filled 2014-10-02: qty 1

## 2014-10-02 MED ORDER — SODIUM CHLORIDE 0.9 % IV SOLN
1000.0000 mL | Freq: Once | INTRAVENOUS | Status: AC
  Administered 2014-10-02: 1000 mL via INTRAVENOUS

## 2014-10-02 MED ORDER — KETOROLAC TROMETHAMINE 30 MG/ML IJ SOLN
30.0000 mg | Freq: Once | INTRAMUSCULAR | Status: AC
  Administered 2014-10-02: 30 mg via INTRAVENOUS

## 2014-10-02 MED ORDER — ONDANSETRON 8 MG/NS 50 ML IVPB
INTRAVENOUS | Status: AC
  Filled 2014-10-02: qty 8

## 2014-10-02 MED ORDER — ONDANSETRON 8 MG/50ML IVPB (CHCC)
8.0000 mg | Freq: Once | INTRAVENOUS | Status: AC
  Administered 2014-10-02: 8 mg via INTRAVENOUS

## 2014-10-02 NOTE — Patient Instructions (Signed)
Dehydration, Adult Dehydration is when you lose more fluids from the body than you take in. Vital organs like the kidneys, brain, and heart cannot function without a proper amount of fluids and salt. Any loss of fluids from the body can cause dehydration.  CAUSES   Vomiting.  Diarrhea.  Excessive sweating.  Excessive urine output.  Fever. SYMPTOMS  Mild dehydration  Thirst.  Dry lips.  Slightly dry mouth. Moderate dehydration  Very dry mouth.  Sunken eyes.  Skin does not bounce back quickly when lightly pinched and released.  Dark urine and decreased urine production.  Decreased tear production.  Headache. Severe dehydration  Very dry mouth.  Extreme thirst.  Rapid, weak pulse (more than 100 beats per minute at rest).  Cold hands and feet.  Not able to sweat in spite of heat and temperature.  Rapid breathing.  Blue lips.  Confusion and lethargy.  Difficulty being awakened.  Minimal urine production.  No tears. DIAGNOSIS  Your caregiver will diagnose dehydration based on your symptoms and your exam. Blood and urine tests will help confirm the diagnosis. The diagnostic evaluation should also identify the cause of dehydration. TREATMENT  Treatment of mild or moderate dehydration can often be done at home by increasing the amount of fluids that you drink. It is best to drink small amounts of fluid more often. Drinking too much at one time can make vomiting worse. Refer to the home care instructions below. Severe dehydration needs to be treated at the hospital where you will probably be given intravenous (IV) fluids that contain water and electrolytes. HOME CARE INSTRUCTIONS   Ask your caregiver about specific rehydration instructions.  Drink enough fluids to keep your urine clear or pale yellow.  Drink small amounts frequently if you have nausea and vomiting.  Eat as you normally do.  Avoid:  Foods or drinks high in sugar.  Carbonated  drinks.  Juice.  Extremely hot or cold fluids.  Drinks with caffeine.  Fatty, greasy foods.  Alcohol.  Tobacco.  Overeating.  Gelatin desserts.  Wash your hands well to avoid spreading bacteria and viruses.  Only take over-the-counter or prescription medicines for pain, discomfort, or fever as directed by your caregiver.  Ask your caregiver if you should continue all prescribed and over-the-counter medicines.  Keep all follow-up appointments with your caregiver. SEEK MEDICAL CARE IF:  You have abdominal pain and it increases or stays in one area (localizes).  You have a rash, stiff neck, or severe headache.  You are irritable, sleepy, or difficult to awaken.  You are weak, dizzy, or extremely thirsty. SEEK IMMEDIATE MEDICAL CARE IF:   You are unable to keep fluids down or you get worse despite treatment.  You have frequent episodes of vomiting or diarrhea.  You have blood or green matter (bile) in your vomit.  You have blood in your stool or your stool looks black and tarry.  You have not urinated in 6 to 8 hours, or you have only urinated a small amount of very dark urine.  You have a fever.  You faint. MAKE SURE YOU:   Understand these instructions.  Will watch your condition.  Will get help right away if you are not doing well or get worse. Document Released: 10/20/2005 Document Revised: 01/12/2012 Document Reviewed: 06/09/2011 ExitCare Patient Information 2015 [employer]. This information is not intended to replace advice given to you by your health care provider. Make sure you discuss any questions you have with your health care   provider.   Ondansetron injection What is this medicine? ONDANSETRON (on DAN se tron) is used to treat nausea and vomiting caused by chemotherapy. It is also used to prevent or treat nausea and vomiting after surgery. This medicine may be used for other purposes; ask your health care provider or pharmacist if you have  questions. COMMON BRAND NAME(S): Zofran What should I tell my health care provider before I take this medicine? They need to know if you have any of these conditions: -heart disease -history of irregular heartbeat -liver disease -low levels of magnesium or potassium in the blood -an unusual or allergic reaction to ondansetron, granisetron, other medicines, foods, dyes, or preservatives -pregnant or trying to get pregnant -breast-feeding How should I use this medicine? This medicine is for infusion into a vein. It is given by a health care professional in a hospital or clinic setting. Talk to your pediatrician regarding the use of this medicine in children. Special care may be needed. Overdosage: If you think you have taken too much of this medicine contact a poison control center or emergency room at once. NOTE: This medicine is only for you. Do not share this medicine with others. What if I miss a dose? This does not apply. What may interact with this medicine? Do not take this medicine with any of the following medications: -apomorphine -certain medicines for fungal infections like fluconazole, itraconazole, ketoconazole, posaconazole, voriconazole -cisapride -dofetilide -dronedarone -pimozide -thioridazine -ziprasidone This medicine may also interact with the following medications: -carbamazepine -certain medicines for depression, anxiety, or psychotic disturbances -fentanyl -linezolid -MAOIs like Carbex, Eldepryl, Marplan, Nardil, and Parnate -methylene blue (injected into a vein) -other medicines that prolong the QT interval (cause an abnormal heart rhythm) -phenytoin -rifampicin -tramadol This list may not describe all possible interactions. Give your health care provider a list of all the medicines, herbs, non-prescription drugs, or dietary supplements you use. Also tell them if you smoke, drink alcohol, or use illegal drugs. Some items may interact with your  medicine. What should I watch for while using this medicine? Your condition will be monitored carefully while you are receiving this medicine. What side effects may I notice from receiving this medicine? Side effects that you should report to your doctor or health care professional as soon as possible: -allergic reactions like skin rash, itching or hives, swelling of the face, lips, or tongue -breathing problems -confusion -dizziness -fast or irregular heartbeat -feeling faint or lightheaded, falls -fever and chills -loss of balance or coordination -seizures -sweating -swelling of the hands and feet -tightness in the chest -tremors -unusually weak or tired Side effects that usually do not require medical attention (report to your doctor or health care professional if they continue or are bothersome): -constipation or diarrhea -headache This list may not describe all possible side effects. Call your doctor for medical advice about side effects. You may report side effects to FDA at 1-800-FDA-1088. Where should I keep my medicine? This drug is given in a hospital or clinic and will not be stored at home. NOTE: This sheet is a summary. It may not cover all possible information. If you have questions about this medicine, talk to your doctor, pharmacist, or health care provider.  2015, Elsevier/Gold Standard. (2013-07-27 16:18:28)  

## 2014-10-02 NOTE — Assessment & Plan Note (Signed)
Patient reports that taking OxyContin made her too sleepy; so she is has been taking only Percocet one tablet every 4-6 hours.  She is complaining of constant discomfort to her entire trunk and back area.  Patient was switched to a fentanyl 25 mcg patch with Percocet for breakthrough pain.  Patient was given Toradol IV initially; and then given morphine 2 mg IV for pain today.  Patient was observed sleeping comfortably while in the infusion area receiving her hydration.  Advised patient and her husband that could increase the fentanyl patch as needed to manage her pain.

## 2014-10-02 NOTE — Assessment & Plan Note (Signed)
Patient states she's not had a normal bowel movement approximate 3 weeks; but did have a small bowel movement this morning. Most likely, this is secondary to narcotic pain medication administration. Long discussion with both patient and her husband regarding a bowel regimen.advised both patient and her husband that she should take MiraLAX every 4-6 hours until she has claimed her bowels out; and then resumed the typical bowel regimen which consist of stool softeners twice daily and MiraLAX once daily.

## 2014-10-02 NOTE — Assessment & Plan Note (Signed)
Patient is complaining of some chronic nausea and occasional vomiting.  She states that she is occasionally taking the Zofran; only minimal effectiveness.  A reviewed anti-emetic administration instructions with both patient and her husband in detail today.  Patient was advised to take Zofran and/or Ativan approximate 30 minutes prior to eating to help prevent nausea issues.  Also, advised patient to try a clear liquid diet until she is able to tolerate a more advanced diet.

## 2014-10-02 NOTE — Assessment & Plan Note (Signed)
Patient does complain of minimal oral intake in the last few weeks; and feels very dehydrated today.  Patient will receive one maternal saline IV fluid rehydration today.  She was also encouraged to push fluids at home as well.  Patient may very well need to return later this week for additional IV fluid rehydration.

## 2014-10-02 NOTE — Assessment & Plan Note (Signed)
Patient is status post protocol therapy study drug per [facility] most recently.  Patient obtained a restaging scan the first week of November 2015 which revealed progression of her disease to the lung and liver.  All further protocol therapy drug was discontinued at that time.  Patient has plans to initiate oral Lonsurf therapy as soon as she becomes stronger and her counts stabilize.  Patient will take the Lonsurf  3 tablets twice daily on days #1 through 5 and days #8 through 12 of each cycle. Advised patient that we'll consult with Dr. Benay Spice; and maternal when to initiate the [place] therapy.

## 2014-10-02 NOTE — Patient Instructions (Signed)
Dehydration, Adult Dehydration is when you lose more fluids from the body than you take in. Vital organs like the kidneys, brain, and heart cannot function without a proper amount of fluids and salt. Any loss of fluids from the body can cause dehydration.  CAUSES   Vomiting.  Diarrhea.  Excessive sweating.  Excessive urine output.  Fever. SYMPTOMS  Mild dehydration  Thirst.  Dry lips.  Slightly dry mouth. Moderate dehydration  Very dry mouth.  Sunken eyes.  Skin does not bounce back quickly when lightly pinched and released.  Dark urine and decreased urine production.  Decreased tear production.  Headache. Severe dehydration  Very dry mouth.  Extreme thirst.  Rapid, weak pulse (more than 100 beats per minute at rest).  Cold hands and feet.  Not able to sweat in spite of heat and temperature.  Rapid breathing.  Blue lips.  Confusion and lethargy.  Difficulty being awakened.  Minimal urine production.  No tears. DIAGNOSIS  Your caregiver will diagnose dehydration based on your symptoms and your exam. Blood and urine tests will help confirm the diagnosis. The diagnostic evaluation should also identify the cause of dehydration. TREATMENT  Treatment of mild or moderate dehydration can often be done at home by increasing the amount of fluids that you drink. It is best to drink small amounts of fluid more often. Drinking too much at one time can make vomiting worse. Refer to the home care instructions below. Severe dehydration needs to be treated at the hospital where you will probably be given intravenous (IV) fluids that contain water and electrolytes. HOME CARE INSTRUCTIONS   Ask your caregiver about specific rehydration instructions.  Drink enough fluids to keep your urine clear or pale yellow.  Drink small amounts frequently if you have nausea and vomiting.  Eat as you normally do.  Avoid:  Foods or drinks high in sugar.  Carbonated  drinks.  Juice.  Extremely hot or cold fluids.  Drinks with caffeine.  Fatty, greasy foods.  Alcohol.  Tobacco.  Overeating.  Gelatin desserts.  Wash your hands well to avoid spreading bacteria and viruses.  Only take over-the-counter or prescription medicines for pain, discomfort, or fever as directed by your caregiver.  Ask your caregiver if you should continue all prescribed and over-the-counter medicines.  Keep all follow-up appointments with your caregiver. SEEK MEDICAL CARE IF:  You have abdominal pain and it increases or stays in one area (localizes).  You have a rash, stiff neck, or severe headache.  You are irritable, sleepy, or difficult to awaken.  You are weak, dizzy, or extremely thirsty. SEEK IMMEDIATE MEDICAL CARE IF:   You are unable to keep fluids down or you get worse despite treatment.  You have frequent episodes of vomiting or diarrhea.  You have blood or green matter (bile) in your vomit.  You have blood in your stool or your stool looks black and tarry.  You have not urinated in 6 to 8 hours, or you have only urinated a small amount of very dark urine.  You have a fever.  You faint. MAKE SURE YOU:   Understand these instructions.  Will watch your condition.  Will get help right away if you are not doing well or get worse. Document Released: 10/20/2005 Document Revised: 01/12/2012 Document Reviewed: 06/09/2011 ExitCare Patient Information 2015 [employer]. This information is not intended to replace advice given to you by your health care provider. Make sure you discuss any questions you have with your health care   provider.  

## 2014-10-02 NOTE — Progress Notes (Signed)
will   SYMPTOM MANAGEMENT CLINIC   HPI: Natalie Mccarthy 58 y.o. female diagnosed with colon cancer.  Patient is status post protocol therapy study drug per [facility] most recently.  Planning to initiate Lonsurf oral therapy soon.  Patient called the cancer Center today requesting urgent care visit.  She received her last critical therapy study drug per [facility] the last week of October 2015.  Restaging scan obtained per [facility] the first week of November 2015 at revealed progression of disease to the lungs and liver.  Patient is complaining today of chronic nausea and intermittent vomiting.  She feels fairly constipated; stating that she has not had a normal bowel movement approximate 3 weeks.  She states that she did have a small bowel movement this morning; but still feels bloated.  She has been taking stool softeners twice daily and intermittent MiraLAX only.  Patient also feels fairly dehydrated; stating that she takes in minimal oral intake.  She states she has no appetite.  Patient is also complaining of poorly managed abdominal discomfort radiating around her back.  She denies any cough, chest pain, chest pressure, or shortness of breath.  She denies any pain with inspiration.  She denies any dysuria or other UTI symptoms.  She denies any recent fevers or chills.  HPI  CURRENT THERAPY: Days with orders from any treatment category:  06/13/2014      SCHEDULING COMMUNICATION      LORazepam (ATIVAN) injection 0.5 mg      palonosetron (ALOXI) injection 0.25 mg      dexamethasone (DECADRON) injection 10 mg      irinotecan (CAMPTOSAR) 180 mg in dextrose 5 % 500 mL chemo infusion      atropine injection 0.5 mg      sodium chloride 0.9 % injection 10 mL      heparin lock flush 100 unit/mL      heparin lock flush 100 unit/mL      alteplase (CATHFLO ACTIVASE) injection 2 mg      sodium chloride 0.9 % injection 3 mL      Cold Pack 1 packet      0.9 %  sodium chloride  infusion      TREATMENT CONDITIONS    ROS  Past Medical History  Diagnosis Date  . History of migraine headaches   . History of mononucleosis   . History of recurrent miscarriages, not currently pregnant   . Microcytic anemia   . Colon cancer 12/2010    metastatic  adenocarcinoma w/liver masses  . PONV (postoperative nausea and vomiting)   . Chemotherapy adverse reaction 04-07-13    last tx. 1'14- did caused platelet to drop 120's, now climbing up    Past Surgical History  Procedure Laterality Date  . Shoulder surgery  2009    Left shoulder  . Tonsillectomy      as child  . Portacath placement  12/20/2010    remains on right chest  . Ercp N/A 04/12/2013    Procedure: ENDOSCOPIC RETROGRADE CHOLANGIOPANCREATOGRAPHY (ERCP);  Surgeon: Jeryl Columbia, MD;  Location: [facility];  Service: Endoscopy;  Laterality: N/A;    has Colon cancer; Dehydration; Anorexia; Nausea with vomiting; Neoplasm related pain; and Constipation on her problem list.     is allergic to compazine and lidocaine.    Medication List       This list is accurate as of: 10/02/14  6:09 PM.  Always use your most recent med list.  Adapalene-Benzoyl Peroxide 0.1-2.5 % gel  Apply 1 application topically at bedtime.     ALPRAZolam 0.5 MG tablet  Commonly known as:  XANAX  Take 1 tablet (0.5 mg total) by mouth every 8 (eight) hours as needed for anxiety or sleep.     clindamycin 1 % lotion  Commonly known as:  CLEOCIN T     clobetasol 0.05 % external solution  Commonly known as:  TEMOVATE     fentaNYL 25 MCG/HR patch  Commonly known as:  DURAGESIC - dosed mcg/hr  Place 1 patch (25 mcg total) onto the skin every 3 (three) days.     fluticasone 0.05 % cream  Commonly known as:  CUTIVATE  Apply topically 2 (two) times daily as needed. Apply to affected areas twice daily as needed     loperamide 2 MG capsule  Commonly known as:  IMODIUM  Take 2 mg by mouth as needed for diarrhea or loose  stools.     LORazepam 1 MG tablet  Commonly known as:  ATIVAN  Take .5-1.0 mg SL or PO  every 6 hours as needed for nausea     magic mouthwash Soln  Take 5 mLs by mouth every 4 (four) hours as needed for mouth pain     magnesium oxide 400 (241.3 MG) MG tablet  Commonly known as:  MAG-OX     minocycline 100 MG capsule  Commonly known as:  MINOCIN,DYNACIN     ondansetron 8 MG tablet  Commonly known as:  ZOFRAN  Take 1 tablet (8 mg total) by mouth every 12 (twelve) hours as needed for nausea.     oxyCODONE-acetaminophen 5-325 MG per tablet  Commonly known as:  PERCOCET/ROXICET  Take 1-2 tablets by mouth every 4 (four) hours as needed for severe pain.     SUMAtriptan 100 MG tablet  Commonly known as:  IMITREX  Take 100 mg by mouth daily as needed for migraine. May repeat in 2 hours if headache persists or recurs.     traMADol 50 MG tablet  Commonly known as:  ULTRAM  Take 1 tablet (50 mg total) by mouth every 6 (six) hours as needed for moderate pain.     trifluridine-tipiracil 15-6.14 MG tablet  Commonly known as:  LONSURF  Take 3 tablets (=45mg ) twice a day.  Take on days 1-5 and 8-12.  Start date 09/25/14     ZANTAC 150 MG tablet  Generic drug:  ranitidine  Take 150 mg by mouth daily.         PHYSICAL EXAMINATION  Blood pressure 129/59, pulse 91, temperature 97.7 F (36.5 C), temperature source Oral, resp. rate 17, height 5\' 7"  (1.702 m), weight 141 lb 6.4 oz (64.139 kg), SpO2 100 %.  Physical Exam  Constitutional: She is oriented to person, place, and time. Vital signs are normal. She appears dehydrated. She appears unhealthy. She appears cachectic.  HENT:  Head: Normocephalic and atraumatic.  Mouth/Throat: Oropharynx is clear and moist.  Eyes: Conjunctivae and EOM are normal. Pupils are equal, round, and reactive to light. Right eye exhibits no discharge. Left eye exhibits no discharge. No scleral icterus.  Neck: Normal range of motion. Neck supple. No JVD  present. No tracheal deviation present. No thyromegaly present.  Cardiovascular: Normal rate, regular rhythm, normal heart sounds and intact distal pulses.   Pulmonary/Chest: Effort normal and breath sounds normal. No respiratory distress. She has no wheezes. She has no rales. She exhibits no tenderness.  Abdominal: Bowel sounds are normal.  She exhibits no distension. There is no tenderness. There is no rebound and no guarding.  Abdomen slightly firm; but not distended.  Musculoskeletal: Normal range of motion. She exhibits no edema or tenderness.  Lymphadenopathy:    She has no cervical adenopathy.  Neurological: She is alert and oriented to person, place, and time.  Patient appeared very weak; and was in wheelchair during the exam.  Patient was noted strong enough to climb onto the exam table when needed.  Skin: Skin is warm and dry. No rash noted. No erythema. There is pallor.  Psychiatric: Affect normal.  Nursing note and vitals reviewed.   LABORATORY DATA:. Office Visit on 10/02/2014  Component Date Value Ref Range Status  . Sodium 10/02/2014 130* 135 - 145 mEq/L Final  . Potassium 10/02/2014 3.9  3.5 - 5.3 mEq/L Final  . Chloride 10/02/2014 92* 96 - 112 mEq/L Final  . CO2 10/02/2014 24  19 - 32 mEq/L Final  . Glucose, Bld 10/02/2014 118* 70 - 99 mg/dL Final  . BUN 10/02/2014 10  6 - 23 mg/dL Final  . Creatinine, Ser 10/02/2014 0.55  0.50 - 1.10 mg/dL Final  . Calcium 10/02/2014 9.2  8.4 - 10.5 mg/dL Final  Appointment on 10/02/2014  Component Date Value Ref Range Status  . Total Bilirubin 10/02/2014 1.3* 0.3 - 1.2 mg/dL Final  . Bilirubin, Direct 10/02/2014 0.5* 0.0 - 0.3 mg/dL Final  . Indirect Bilirubin 10/02/2014 0.8  0.2 - 1.2 mg/dL Final  . Alkaline Phosphatase 10/02/2014 454* 39 - 117 U/L Final  . AST 10/02/2014 86* 0 - 37 U/L Final  . ALT 10/02/2014 19  0 - 35 U/L Final  . Total Protein 10/02/2014 7.8  6.0 - 8.3 g/dL Final  . Albumin 10/02/2014 2.8* 3.5 - 5.2 g/dL  Final  . WBC 10/02/2014 3.8* 3.9 - 10.3 10e3/uL Final  . NEUT# 10/02/2014 2.9  1.5 - 6.5 10e3/uL Final  . HGB 10/02/2014 11.1* 11.6 - 15.9 g/dL Final  . HCT 10/02/2014 33.9* 34.8 - 46.6 % Final  . Platelets 10/02/2014 145  145 - 400 10e3/uL Final  . MCV 10/02/2014 82.9  79.5 - 101.0 fL Final  . MCH 10/02/2014 27.1  25.1 - 34.0 pg Final  . MCHC 10/02/2014 32.7  31.5 - 36.0 g/dL Final  . RBC 10/02/2014 4.09  3.70 - 5.45 10e6/uL Final  . RDW 10/02/2014 22.1* 11.2 - 14.5 % Final  . lymph# 10/02/2014 0.3* 0.9 - 3.3 10e3/uL Final  . MONO# 10/02/2014 0.6  0.1 - 0.9 10e3/uL Final  . Eosinophils Absolute 10/02/2014 0.1  0.0 - 0.5 10e3/uL Final  . Basophils Absolute 10/02/2014 0.0  0.0 - 0.1 10e3/uL Final  . NEUT% 10/02/2014 74.2  38.4 - 76.8 % Final  . LYMPH% 10/02/2014 6.5* 14.0 - 49.7 % Final  . MONO% 10/02/2014 15.6* 0.0 - 14.0 % Final  . EOS% 10/02/2014 3.4  0.0 - 7.0 % Final  . BASO% 10/02/2014 0.3  0.0 - 2.0 % Final     RADIOGRAPHIC STUDIES: No results found.  ASSESSMENT/PLAN:    Anorexia Patient has had little appetite for the past several weeks.  This was a, this is secondary to both her cancer and a chemotherapy side effect.  Patient was encouraged to eat multiple small meals throughout the day if at all possible.  Also, patient was advised to premedicate with anti-nausea medicine prior to eating.  Is  Colon cancer Patient is status post protocol therapy study drug per [facility] most  recently.  Patient obtained a restaging scan the first week of November 2015 which revealed progression of her disease to the lung and liver.  All further protocol therapy drug was discontinued at that time.  Patient has plans to initiate oral Lonsurf therapy as soon as she becomes stronger and her counts stabilize.  Patient will take the Lonsurf  3 tablets twice daily on days #1 through 5 and days #8 through 12 of each cycle. Advised patient that we'll consult with Dr. Benay Spice; and maternal when to  initiate the [place] therapy.  Constipation Patient states she's not had a normal bowel movement approximate 3 weeks; but did have a small bowel movement this morning. Most likely, this is secondary to narcotic pain medication administration. Long discussion with both patient and her husband regarding a bowel regimen.advised both patient and her husband that she should take MiraLAX every 4-6 hours until she has claimed her bowels out; and then resumed the typical bowel regimen which consist of stool softeners twice daily and MiraLAX once daily.  Dehydration Patient does complain of minimal oral intake in the last few weeks; and feels very dehydrated today.  Patient will receive one maternal saline IV fluid rehydration today.  She was also encouraged to push fluids at home as well.  Patient may very well need to return later this week for additional IV fluid rehydration.  Nausea with vomiting Patient is complaining of some chronic nausea and occasional vomiting.  She states that she is occasionally taking the Zofran; only minimal effectiveness.  A reviewed anti-emetic administration instructions with both patient and her husband in detail today.  Patient was advised to take Zofran and/or Ativan approximate 30 minutes prior to eating to help prevent nausea issues.  Also, advised patient to try a clear liquid diet until she is able to tolerate a more advanced diet.  Neoplasm related pain Patient reports that taking OxyContin made her too sleepy; so she is has been taking only Percocet one tablet every 4-6 hours.  She is complaining of constant discomfort to her entire trunk and back area.  Patient was switched to a fentanyl 25 mcg patch with Percocet for breakthrough pain.  Patient was given Toradol IV initially; and then given morphine 2 mg IV for pain today.  Patient was observed sleeping comfortably while in the infusion area receiving her hydration.  Advised patient and her husband that could increase  the fentanyl patch as needed to manage her pain.   Patient stated understanding of all instructions; and was in agreement with this plan of care. The patient knows to call the clinic with any problems, questions or concerns.   Review/collaboration with Dr. Benay Spice regarding all aspects of patient's visit today.   Total time spent with patient was 40 minutes;  with greater than 75 percent of that time spent in face to face counseling regarding her symptoms, and coordination of care and follow up.  Disclaimer: This note was dictated with voice recognition software. Similar sounding words can inadvertently be transcribed and may not be corrected upon review.   Drue Second, NP 10/02/2014

## 2014-10-02 NOTE — Assessment & Plan Note (Signed)
Patient has had little appetite for the past several weeks.  This was a, this is secondary to both her cancer and a chemotherapy side effect.  Patient was encouraged to eat multiple small meals throughout the day if at all possible.  Also, patient was advised to premedicate with anti-nausea medicine prior to eating.  Is

## 2014-10-02 NOTE — Telephone Encounter (Signed)
   Provider input needed:DEHYDRATION AND PAIN    Reason for call: PT. IS VERY WEAK  Gastrointestinal: positive for abdominal pain, constipation, nausea and vomiting   ALLERGIES:  is allergic to compazine and lidocaine.  Patient last received chemotherapy/ treatment on n/a  Patient was last seen in the office on 09/19/14  Next appt is 10/10/14  Is patient having fevers greater than 100.5?  no   Is patient having uncontrolled pain, or new pain? yes, PT. PAIN IS CONSTANT.   Is patient having new back pain that changes with position (worsens or eases when laying down?)  no   Is patient able to eat and drink? no, PT. IS TAKING A SOME FLUIDS.    Is patient able to pass stool without difficulty?   no, PT. IS HAVING PROBLEMS WITH CONSTIPATION. SMALL BOWEL MOVEMENT TODAY.     Is patient having uncontrolled nausea?  yes    patient calls 10/02/2014 with complaint of  Gastrointestinal: positive for abdominal pain, constipation, nausea and vomiting   Summary Based on the above information advised patient to  [facility] AND SEE [place].   Waverly Ferrari M  10/02/2014, 11:53 AM   Background Info  Natalie Mccarthy   DOB: 07-13-1956   MR#: [id number]   CSN#   [id number] 11/30/2015D

## 2014-10-03 ENCOUNTER — Telehealth: Payer: Self-pay | Admitting: Nurse Practitioner

## 2014-10-03 ENCOUNTER — Telehealth: Payer: Self-pay | Admitting: Oncology

## 2014-10-03 ENCOUNTER — Other Ambulatory Visit: Payer: Self-pay | Admitting: *Deleted

## 2014-10-03 ENCOUNTER — Telehealth: Payer: Self-pay | Admitting: *Deleted

## 2014-10-03 ENCOUNTER — Other Ambulatory Visit: Payer: Self-pay | Admitting: Nurse Practitioner

## 2014-10-03 NOTE — Telephone Encounter (Signed)
per 2nd pf to moe appt to earlier appt-sent CB an email to advise there is no time that would work and IV appt cannotbe moved per MW. Adv CB tolook at sch to choose time and reply

## 2014-10-03 NOTE — Telephone Encounter (Signed)
Called patient to follow-up after receiving IVF's yesterday. Patient states that she feels somewhat better but still wakes up with a dry mouth. States that she is eating small amounts and is "sipping" on fluids. States that she can only sip on fluids otherwise drinking too much at one time will make her throw up. No vomiting. Advised patient that scheduling would be calling her for an appt time tomorrow to see Cyndee and Dr. Benay Spice as well as receive IVF's again. Patient verbalized understanding.

## 2014-10-03 NOTE — Telephone Encounter (Signed)
per pof to sch pt iv fludis-per MW no avail seats-MW stated Natalie Mccarthy will have to have her nurseto make arrangements in trmtroom,-cld Natalie Mccarthy to adv-awaiting call back

## 2014-10-03 NOTE — Telephone Encounter (Signed)
PER CB CHANGE TIME OF [facility]NT APPT TO 11AM AND PER CB FLUIDS WILL BE GIVEN ON EXAM SIDE AFTER VISIT. S/W HUSBAND HE IS AWARE.

## 2014-10-03 NOTE — Telephone Encounter (Signed)
s.w. pt husband and advised on DEc appt...done

## 2014-10-04 ENCOUNTER — Ambulatory Visit (HOSPITAL_BASED_OUTPATIENT_CLINIC_OR_DEPARTMENT_OTHER): Payer: BC Managed Care – PPO

## 2014-10-04 ENCOUNTER — Ambulatory Visit (HOSPITAL_BASED_OUTPATIENT_CLINIC_OR_DEPARTMENT_OTHER): Payer: BC Managed Care – PPO | Admitting: Nurse Practitioner

## 2014-10-04 ENCOUNTER — Telehealth: Payer: Self-pay | Admitting: Nurse Practitioner

## 2014-10-04 ENCOUNTER — Ambulatory Visit: Payer: BC Managed Care – PPO

## 2014-10-04 ENCOUNTER — Other Ambulatory Visit: Payer: BC Managed Care – PPO

## 2014-10-04 ENCOUNTER — Encounter: Payer: Self-pay | Admitting: Nurse Practitioner

## 2014-10-04 ENCOUNTER — Other Ambulatory Visit: Payer: Self-pay | Admitting: *Deleted

## 2014-10-04 VITALS — BP 117/70 | HR 94 | Temp 97.9°F | Resp 18

## 2014-10-04 DIAGNOSIS — R112 Nausea with vomiting, unspecified: Secondary | ICD-10-CM

## 2014-10-04 DIAGNOSIS — E860 Dehydration: Secondary | ICD-10-CM

## 2014-10-04 DIAGNOSIS — C787 Secondary malignant neoplasm of liver and intrahepatic bile duct: Secondary | ICD-10-CM

## 2014-10-04 DIAGNOSIS — C184 Malignant neoplasm of transverse colon: Secondary | ICD-10-CM

## 2014-10-04 DIAGNOSIS — Z95828 Presence of other vascular implants and grafts: Secondary | ICD-10-CM

## 2014-10-04 DIAGNOSIS — R630 Anorexia: Secondary | ICD-10-CM

## 2014-10-04 DIAGNOSIS — G893 Neoplasm related pain (acute) (chronic): Secondary | ICD-10-CM

## 2014-10-04 DIAGNOSIS — K5900 Constipation, unspecified: Secondary | ICD-10-CM

## 2014-10-04 DIAGNOSIS — R627 Adult failure to thrive: Secondary | ICD-10-CM

## 2014-10-04 DIAGNOSIS — C189 Malignant neoplasm of colon, unspecified: Secondary | ICD-10-CM

## 2014-10-04 LAB — COMPREHENSIVE METABOLIC PANEL (CC13)
ALT: 19 U/L (ref 0–55)
AST: 88 U/L — AB (ref 5–34)
Albumin: 2.5 g/dL — ABNORMAL LOW (ref 3.5–5.0)
Alkaline Phosphatase: 439 U/L — ABNORMAL HIGH (ref 40–150)
Anion Gap: 10 mEq/L (ref 3–11)
BUN: 8 mg/dL (ref 7.0–26.0)
CALCIUM: 8.6 mg/dL (ref 8.4–10.4)
CHLORIDE: 101 meq/L (ref 98–109)
CO2: 25 mEq/L (ref 22–29)
CREATININE: 0.6 mg/dL (ref 0.6–1.1)
Glucose: 125 mg/dl (ref 70–140)
Potassium: 3.9 mEq/L (ref 3.5–5.1)
SODIUM: 136 meq/L (ref 136–145)
TOTAL PROTEIN: 6.6 g/dL (ref 6.4–8.3)
Total Bilirubin: 1.3 mg/dL — ABNORMAL HIGH (ref 0.20–1.20)

## 2014-10-04 LAB — CBC WITH DIFFERENTIAL/PLATELET
BASO%: 0.3 % (ref 0.0–2.0)
Basophils Absolute: 0 10*3/uL (ref 0.0–0.1)
EOS%: 0.7 % (ref 0.0–7.0)
Eosinophils Absolute: 0 10*3/uL (ref 0.0–0.5)
HEMATOCRIT: 32.2 % — AB (ref 34.8–46.6)
HGB: 10.2 g/dL — ABNORMAL LOW (ref 11.6–15.9)
LYMPH#: 0.2 10*3/uL — AB (ref 0.9–3.3)
LYMPH%: 4.5 % — ABNORMAL LOW (ref 14.0–49.7)
MCH: 26 pg (ref 25.1–34.0)
MCHC: 31.7 g/dL (ref 31.5–36.0)
MCV: 82.2 fL (ref 79.5–101.0)
MONO#: 0.5 10*3/uL (ref 0.1–0.9)
MONO%: 10.8 % (ref 0.0–14.0)
NEUT#: 4 10*3/uL (ref 1.5–6.5)
NEUT%: 83.7 % — AB (ref 38.4–76.8)
Platelets: 171 10*3/uL (ref 145–400)
RBC: 3.92 10*6/uL (ref 3.70–5.45)
RDW: 24.6 % — ABNORMAL HIGH (ref 11.2–14.5)
WBC: 4.8 10*3/uL (ref 3.9–10.3)

## 2014-10-04 MED ORDER — KETOROLAC TROMETHAMINE 30 MG/ML IJ SOLN
30.0000 mg | Freq: Once | INTRAMUSCULAR | Status: AC
  Administered 2014-10-04: 30 mg via INTRAVENOUS

## 2014-10-04 MED ORDER — SODIUM CHLORIDE 0.9 % IJ SOLN
10.0000 mL | INTRAMUSCULAR | Status: DC | PRN
  Administered 2014-10-04: 10 mL via INTRAVENOUS
  Filled 2014-10-04: qty 10

## 2014-10-04 MED ORDER — ONDANSETRON 8 MG/NS 50 ML IVPB
INTRAVENOUS | Status: AC
  Filled 2014-10-04: qty 8

## 2014-10-04 MED ORDER — KETOROLAC TROMETHAMINE 30 MG/ML IJ SOLN
INTRAMUSCULAR | Status: AC
  Filled 2014-10-04: qty 1

## 2014-10-04 MED ORDER — HEPARIN SOD (PORK) LOCK FLUSH 100 UNIT/ML IV SOLN
500.0000 [IU] | Freq: Once | INTRAVENOUS | Status: AC
  Administered 2014-10-04: 500 [IU] via INTRAVENOUS
  Filled 2014-10-04: qty 5

## 2014-10-04 MED ORDER — ONDANSETRON HCL 8 MG PO TABS: 8.0000 mg | ORAL_TABLET | Freq: Three times a day (TID) | ORAL | Status: AC | PRN

## 2014-10-04 MED ORDER — SODIUM CHLORIDE 0.9 % IV SOLN
Freq: Once | INTRAVENOUS | Status: AC
  Administered 2014-10-04: 14:00:00 via INTRAVENOUS

## 2014-10-04 MED ORDER — ONDANSETRON 8 MG/50ML IVPB (CHCC)
8.0000 mg | Freq: Once | INTRAVENOUS | Status: AC
  Administered 2014-10-04: 8 mg via INTRAVENOUS

## 2014-10-04 NOTE — Assessment & Plan Note (Signed)
Patient is status post protocol therapy study drug per [facility] most recently.  The patient obtained a restaging scan 09/03/2014; which revealed progression of her disease to lung and liver.  All further therapy drug was discontinued at that time.  Patient has plans to initiate oral Lonsurf therapy as who she became stronger in her count stabilized.  Patient will take the Lonsurf-3 tablets twice daily on days #1 through 5; and days #8 through 12 of each cycle.  We'll re-evaluate patient at the beginning of next week to see if she is strong enough and her counts are stabilized for initiation of this new oral therapy.

## 2014-10-04 NOTE — Assessment & Plan Note (Signed)
Patient has been complaining of constipation; but has initiated both stool softeners twice daily and MiraLAX as previously directed.  She states she has had multiple bowel movements since her last visit here at the [facility]; and now feels much better.  Mostly, constipation is secondary to narcotic pain medication administration.  Patient was encouraged to continue with the bowel regimen.

## 2014-10-04 NOTE — Assessment & Plan Note (Signed)
Patient was complaining of chronic, generalized pain; and specific trunk pain earlier this week.  Patient was switched to a fentanyl 25 mcg patch this past Monday, 10/02/2014.  She states that her pain is much better control now; and she has plans to use the Percocet for breakthrough pain.  Patient was given Toradol IV for pain control in the infusion Center per patient request.

## 2014-10-04 NOTE — Assessment & Plan Note (Signed)
Patient continues to complain of minimal oral intake; and does continue to feel dehydrated.  Labs today show a slight improvement in patient's dehydration.  Patient will receive one maternal saline IV fluid rehydration today.  She has plans to return this coming Friday 10/06/2014 for additional IV fluid rehydration.

## 2014-10-04 NOTE — Telephone Encounter (Signed)
per pof to sch pt appt w/CB-CB req that pt have IV in exam room w/visit-cld MW to adv-MW stated she will call me back

## 2014-10-04 NOTE — Patient Instructions (Signed)

## 2014-10-04 NOTE — Patient Instructions (Signed)

## 2014-10-04 NOTE — Assessment & Plan Note (Signed)
Patient continues with a minimal appetite.  Most likely this is secondary to both her cancer and acute therapy side effect.  Patient was encouraged to eat multiple small well throughout the day without possible.  Patient was also try to premedicate with the anti-nausea medication prior to eating.

## 2014-10-04 NOTE — Progress Notes (Signed)
will   SYMPTOM MANAGEMENT CLINIC   HPI: Natalie Mccarthy 58 y.o. female diagnosed with colon cancer.  Patient was recently underwent a protocol drug therapy per [facility].  Recent restaging scans did reveal progression.  Patient has plans to initiate oral Lonsurf therapy within next week or so.  Patient returned to the [facility] today for additional IV fluid rehydration.  She was seen earlier this week on Monday, 10/02/2014 for the same complaint of dehydration, minimal appetite, chronic nausea/vomiting, and constipation.  Patient states that she has been premedicated with Zofran on a more regular basis prior to her meals.  She states this has greatly helped with vomiting; but she did vomit 3 times earlier this morning.  She states she has been using both stool softeners and MiraLAX since this past Monday as well; her constipation issues have greatly improved.  Patient was also switched to a fentanyl 25 mcg patch Monday evening as well; and she states her pain is much better under control.  Patient has been using Percocet for breakthrough pain.  Patient is any recent fevers or chills.  She is requesting a refill of her Zofran tablets today.   HPI  CURRENT THERAPY: Upcoming Treatment Dates - COLORECTAL Irinotecan q14d Days with orders from any treatment category:  06/13/2014      SCHEDULING COMMUNICATION      LORazepam (ATIVAN) injection 0.5 mg      palonosetron (ALOXI) injection 0.25 mg      dexamethasone (DECADRON) injection 10 mg      irinotecan (CAMPTOSAR) 180 mg in dextrose 5 % 500 mL chemo infusion      atropine injection 0.5 mg      sodium chloride 0.9 % injection 10 mL      heparin lock flush 100 unit/mL      heparin lock flush 100 unit/mL      alteplase (CATHFLO ACTIVASE) injection 2 mg      sodium chloride 0.9 % injection 3 mL      Cold Pack 1 packet      0.9 %  sodium chloride infusion      TREATMENT CONDITIONS    ROS  Past Medical History  Diagnosis Date  .  History of migraine headaches   . History of mononucleosis   . History of recurrent miscarriages, not currently pregnant   . Microcytic anemia   . Colon cancer 12/2010    metastatic  adenocarcinoma w/liver masses  . PONV (postoperative nausea and vomiting)   . Chemotherapy adverse reaction 04-07-13    last tx. 1'14- did caused platelet to drop 120's, now climbing up    Past Surgical History  Procedure Laterality Date  . Shoulder surgery  2009    Left shoulder  . Tonsillectomy      as child  . Portacath placement  12/20/2010    remains on right chest  . Ercp N/A 04/12/2013    Procedure: ENDOSCOPIC RETROGRADE CHOLANGIOPANCREATOGRAPHY (ERCP);  Surgeon: Jeryl Columbia, MD;  Location: [facility];  Service: Endoscopy;  Laterality: N/A;    has Colon cancer; Dehydration; Anorexia; Nausea with vomiting; Neoplasm related pain; and Constipation on her problem list.     is allergic to compazine and lidocaine.    Medication List       This list is accurate as of: 10/04/14  4:46 PM.  Always use your most recent med list.               Adapalene-Benzoyl Peroxide 0.1-2.5 %  gel  Apply 1 application topically at bedtime.     ALPRAZolam 0.5 MG tablet  Commonly known as:  XANAX  Take 1 tablet (0.5 mg total) by mouth every 8 (eight) hours as needed for anxiety or sleep.     clindamycin 1 % lotion  Commonly known as:  CLEOCIN T     clobetasol 0.05 % external solution  Commonly known as:  TEMOVATE     fentaNYL 25 MCG/HR patch  Commonly known as:  DURAGESIC - dosed mcg/hr  Place 1 patch (25 mcg total) onto the skin every 3 (three) days.     fluticasone 0.05 % cream  Commonly known as:  CUTIVATE  Apply topically 2 (two) times daily as needed. Apply to affected areas twice daily as needed     loperamide 2 MG capsule  Commonly known as:  IMODIUM  Take 2 mg by mouth as needed for diarrhea or loose stools.     LORazepam 1 MG tablet  Commonly known as:  ATIVAN  Take .5-1.0 mg SL or PO   every 6 hours as needed for nausea     magic mouthwash Soln  Take 5 mLs by mouth every 4 (four) hours as needed for mouth pain     magnesium oxide 400 (241.3 MG) MG tablet  Commonly known as:  MAG-OX     minocycline 100 MG capsule  Commonly known as:  MINOCIN,DYNACIN     ondansetron 8 MG tablet  Commonly known as:  ZOFRAN  Take 1 tablet (8 mg total) by mouth every 8 (eight) hours as needed for nausea.     oxyCODONE-acetaminophen 5-325 MG per tablet  Commonly known as:  PERCOCET/ROXICET  Take 1-2 tablets by mouth every 4 (four) hours as needed for severe pain.     SUMAtriptan 100 MG tablet  Commonly known as:  IMITREX  Take 100 mg by mouth daily as needed for migraine. May repeat in 2 hours if headache persists or recurs.     traMADol 50 MG tablet  Commonly known as:  ULTRAM  Take 1 tablet (50 mg total) by mouth every 6 (six) hours as needed for moderate pain.     trifluridine-tipiracil 15-6.14 MG tablet  Commonly known as:  LONSURF  Take 3 tablets (=45mg ) twice a day.  Take on days 1-5 and 8-12.  Start date 09/25/14     ZANTAC 150 MG tablet  Generic drug:  ranitidine  Take 150 mg by mouth daily.         PHYSICAL EXAMINATION  Blood pressure 117/70, pulse 94, temperature 97.9 F (36.6 C), temperature source Oral, resp. rate 18, SpO2 97 %.  Physical Exam  Constitutional: She is oriented to person, place, and time. Vital signs are normal. She appears dehydrated. She appears unhealthy. She appears cachectic.  Patient appears slightly less pale and less weak than on exam this past Monday, 10/02/2014.  HENT:  Head: Normocephalic.  Mouth/Throat: Oropharynx is clear and moist.  Eyes: Conjunctivae and EOM are normal. Pupils are equal, round, and reactive to light. Right eye exhibits no discharge. Left eye exhibits no discharge. No scleral icterus.  Neck: Normal range of motion. Neck supple. No JVD present. No tracheal deviation present. No thyromegaly present.    Cardiovascular: Normal rate, regular rhythm, normal heart sounds and intact distal pulses.   Pulmonary/Chest: Effort normal and breath sounds normal. No respiratory distress. She has no wheezes. She has no rales. She exhibits no tenderness.  Abdominal: Soft. Bowel sounds are normal.  She exhibits no distension and no mass. There is no tenderness. There is no rebound and no guarding.  Musculoskeletal: Normal range of motion. She exhibits no edema or tenderness.  Lymphadenopathy:    She has no cervical adenopathy.  Neurological: She is alert and oriented to person, place, and time.  Patient continues very weak; and was in a wheelchair during the exam.  Skin: Skin is warm and dry. No rash noted. No erythema. There is pallor.  Psychiatric: Affect normal.  Nursing note and vitals reviewed.   LABORATORY DATA:. Appointment on 10/04/2014  Component Date Value Ref Range Status  . WBC 10/04/2014 4.8  3.9 - 10.3 10e3/uL Final  . NEUT# 10/04/2014 4.0  1.5 - 6.5 10e3/uL Final  . HGB 10/04/2014 10.2* 11.6 - 15.9 g/dL Final  . HCT 10/04/2014 32.2* 34.8 - 46.6 % Final  . Platelets 10/04/2014 171  145 - 400 10e3/uL Final  . MCV 10/04/2014 82.2  79.5 - 101.0 fL Final  . MCH 10/04/2014 26.0  25.1 - 34.0 pg Final  . MCHC 10/04/2014 31.7  31.5 - 36.0 g/dL Final  . RBC 10/04/2014 3.92  3.70 - 5.45 10e6/uL Final  . RDW 10/04/2014 24.6* 11.2 - 14.5 % Final  . lymph# 10/04/2014 0.2* 0.9 - 3.3 10e3/uL Final  . MONO# 10/04/2014 0.5  0.1 - 0.9 10e3/uL Final  . Eosinophils Absolute 10/04/2014 0.0  0.0 - 0.5 10e3/uL Final  . Basophils Absolute 10/04/2014 0.0  0.0 - 0.1 10e3/uL Final  . NEUT% 10/04/2014 83.7* 38.4 - 76.8 % Final  . LYMPH% 10/04/2014 4.5* 14.0 - 49.7 % Final  . MONO% 10/04/2014 10.8  0.0 - 14.0 % Final  . EOS% 10/04/2014 0.7  0.0 - 7.0 % Final  . BASO% 10/04/2014 0.3  0.0 - 2.0 % Final  . Sodium 10/04/2014 136  136 - 145 mEq/L Final  . Potassium 10/04/2014 3.9  3.5 - 5.1 mEq/L Final  .  Chloride 10/04/2014 101  98 - 109 mEq/L Final  . CO2 10/04/2014 25  22 - 29 mEq/L Final  . Glucose 10/04/2014 125  70 - 140 mg/dl Final  . BUN 10/04/2014 8.0  7.0 - 26.0 mg/dL Final  . Creatinine 10/04/2014 0.6  0.6 - 1.1 mg/dL Final  . Total Bilirubin 10/04/2014 1.30* 0.20 - 1.20 mg/dL Final  . Alkaline Phosphatase 10/04/2014 439* 40 - 150 U/L Final  . AST 10/04/2014 88* 5 - 34 U/L Final  . ALT 10/04/2014 19  0 - 55 U/L Final  . Total Protein 10/04/2014 6.6  6.4 - 8.3 g/dL Final  . Albumin 10/04/2014 2.5* 3.5 - 5.0 g/dL Final  . Calcium 10/04/2014 8.6  8.4 - 10.4 mg/dL Final  . Anion Gap 10/04/2014 10  3 - 11 mEq/L Final  Office Visit on 10/02/2014  Component Date Value Ref Range Status  . Sodium 10/02/2014 130* 135 - 145 mEq/L Final  . Potassium 10/02/2014 3.9  3.5 - 5.3 mEq/L Final  . Chloride 10/02/2014 92* 96 - 112 mEq/L Final  . CO2 10/02/2014 24  19 - 32 mEq/L Final  . Glucose, Bld 10/02/2014 118* 70 - 99 mg/dL Final  . BUN 10/02/2014 10  6 - 23 mg/dL Final  . Creatinine, Ser 10/02/2014 0.55  0.50 - 1.10 mg/dL Final  . Calcium 10/02/2014 9.2  8.4 - 10.5 mg/dL Final  Appointment on 10/02/2014  Component Date Value Ref Range Status  . Total Bilirubin 10/02/2014 1.3* 0.3 - 1.2 mg/dL Final  . Bilirubin, Direct  10/02/2014 0.5* 0.0 - 0.3 mg/dL Final  . Indirect Bilirubin 10/02/2014 0.8  0.2 - 1.2 mg/dL Final  . Alkaline Phosphatase 10/02/2014 454* 39 - 117 U/L Final  . AST 10/02/2014 86* 0 - 37 U/L Final  . ALT 10/02/2014 19  0 - 35 U/L Final  . Total Protein 10/02/2014 7.8  6.0 - 8.3 g/dL Final  . Albumin 10/02/2014 2.8* 3.5 - 5.2 g/dL Final  . WBC 10/02/2014 3.8* 3.9 - 10.3 10e3/uL Final  . NEUT# 10/02/2014 2.9  1.5 - 6.5 10e3/uL Final  . HGB 10/02/2014 11.1* 11.6 - 15.9 g/dL Final  . HCT 10/02/2014 33.9* 34.8 - 46.6 % Final  . Platelets 10/02/2014 145  145 - 400 10e3/uL Final  . MCV 10/02/2014 82.9  79.5 - 101.0 fL Final  . MCH 10/02/2014 27.1  25.1 - 34.0 pg Final  . MCHC  10/02/2014 32.7  31.5 - 36.0 g/dL Final  . RBC 10/02/2014 4.09  3.70 - 5.45 10e6/uL Final  . RDW 10/02/2014 22.1* 11.2 - 14.5 % Final  . lymph# 10/02/2014 0.3* 0.9 - 3.3 10e3/uL Final  . MONO# 10/02/2014 0.6  0.1 - 0.9 10e3/uL Final  . Eosinophils Absolute 10/02/2014 0.1  0.0 - 0.5 10e3/uL Final  . Basophils Absolute 10/02/2014 0.0  0.0 - 0.1 10e3/uL Final  . NEUT% 10/02/2014 74.2  38.4 - 76.8 % Final  . LYMPH% 10/02/2014 6.5* 14.0 - 49.7 % Final  . MONO% 10/02/2014 15.6* 0.0 - 14.0 % Final  . EOS% 10/02/2014 3.4  0.0 - 7.0 % Final  . BASO% 10/02/2014 0.3  0.0 - 2.0 % Final     RADIOGRAPHIC STUDIES: No results found.  ASSESSMENT/PLAN:    Anorexia Patient continues with a minimal appetite.  Most likely this is secondary to both her cancer and acute therapy side effect.  Patient was encouraged to eat multiple small well throughout the day without possible.  Patient was also try to premedicate with the anti-nausea medication prior to eating.  Colon cancer Patient is status post protocol therapy study drug per [facility] most recently.  The patient obtained a restaging scan 09/03/2014; which revealed progression of her disease to lung and liver.  All further therapy drug was discontinued at that time.  Patient has plans to initiate oral Lonsurf therapy as who she became stronger in her count stabilized.  Patient will take the Lonsurf-3 tablets twice daily on days #1 through 5; and days #8 through 12 of each cycle.  We'll re-evaluate patient at the beginning of next week to see if she is strong enough and her counts are stabilized for initiation of this new oral therapy.  Constipation Patient has been complaining of constipation; but has initiated both stool softeners twice daily and MiraLAX as previously directed.  She states she has had multiple bowel movements since her last visit here at the [facility]; and now feels much better.  Mostly, constipation is secondary to narcotic pain  medication administration.  Patient was encouraged to continue with the bowel regimen.  Dehydration Patient continues to complain of minimal oral intake; and does continue to feel dehydrated.  Labs today show a slight improvement in patient's dehydration.  Patient will receive one maternal saline IV fluid rehydration today.  She has plans to return this coming Friday 10/06/2014 for additional IV fluid rehydration.  Nausea with vomiting Patient continues to complain of intermittent nausea and vomiting.  She states she vomited 3 times this morning.  She is requesting a refill  of her Zofran today.  Patient was also given a Zofran IV while in the infusion area.  Neoplasm related pain Patient was complaining of chronic, generalized pain; and specific trunk pain earlier this week.  Patient was switched to a fentanyl 25 mcg patch this past Monday, 10/02/2014.  She states that her pain is much better control now; and she has plans to use the Percocet for breakthrough pain.  Patient was given Toradol IV for pain control in the infusion Center per patient request.  Patient stated understanding of all instructions; and was in agreement with this plan of care. The patient knows to call the clinic with any problems, questions or concerns.   This was shared visit with Dr. Benay Spice today.   Total time spent with patient was 40 minutes;  with greater than 75 percent of that time spent in face to face counseling regarding her symptoms, monitoring of patient while in the infusion area, and coordination of care and follow up.  Disclaimer: This note was dictated with voice recognition software. Similar sounding words can inadvertently be transcribed and may not be corrected upon review.   Drue Second, NP 10/04/2014   This was a shared visit with Drue Second.  Ms. Elliott has failure to thrive secondary to progressive metastatic colon cancer. I suspect the pain and nausea are secondary to liver metastases.  She will continue the Duragesic patch and Percocet for pain. She will not be able to start the [place] unless her performance status improves.  Julieanne Manson, M.D.

## 2014-10-04 NOTE — Assessment & Plan Note (Signed)
Patient continues to complain of intermittent nausea and vomiting.  She states she vomited 3 times this morning.  She is requesting a refill of her Zofran today.  Patient was also given a Zofran IV while in the infusion area.

## 2014-10-05 ENCOUNTER — Telehealth: Payer: Self-pay | Admitting: Nurse Practitioner

## 2014-10-05 ENCOUNTER — Telehealth: Payer: Self-pay

## 2014-10-05 NOTE — Telephone Encounter (Signed)
Called pt to confirm appts for 12/4. Husband asked for later time frame. S/w cyndee and called back. We can r/s to 12 noon. Husband said OK.

## 2014-10-05 NOTE — Telephone Encounter (Signed)
cld pt to adv of time for appt on 12/4-spoke with Glen(spouse)he stated appt way too early for pt and he has spoke to someone else that is working on [employer] name given)he stated they are calling him back

## 2014-10-05 NOTE — Telephone Encounter (Signed)
added appt for tomorrow...Marland Kitchenper Juliann Pulse pt is aware of d.t

## 2014-10-06 ENCOUNTER — Ambulatory Visit: Payer: BC Managed Care – PPO

## 2014-10-06 ENCOUNTER — Ambulatory Visit (HOSPITAL_BASED_OUTPATIENT_CLINIC_OR_DEPARTMENT_OTHER): Payer: BC Managed Care – PPO

## 2014-10-06 ENCOUNTER — Ambulatory Visit (HOSPITAL_BASED_OUTPATIENT_CLINIC_OR_DEPARTMENT_OTHER): Payer: BC Managed Care – PPO | Admitting: Nurse Practitioner

## 2014-10-06 ENCOUNTER — Encounter: Payer: BC Managed Care – PPO | Admitting: Nurse Practitioner

## 2014-10-06 VITALS — BP 139/81 | HR 94 | Temp 97.6°F | Resp 18 | Ht 67.0 in | Wt 144.5 lb

## 2014-10-06 VITALS — BP 125/65 | HR 92 | Temp 98.3°F | Resp 18

## 2014-10-06 DIAGNOSIS — K5900 Constipation, unspecified: Secondary | ICD-10-CM

## 2014-10-06 DIAGNOSIS — G893 Neoplasm related pain (acute) (chronic): Secondary | ICD-10-CM

## 2014-10-06 DIAGNOSIS — E860 Dehydration: Secondary | ICD-10-CM

## 2014-10-06 DIAGNOSIS — C184 Malignant neoplasm of transverse colon: Secondary | ICD-10-CM

## 2014-10-06 DIAGNOSIS — C189 Malignant neoplasm of colon, unspecified: Secondary | ICD-10-CM

## 2014-10-06 DIAGNOSIS — R112 Nausea with vomiting, unspecified: Secondary | ICD-10-CM

## 2014-10-06 DIAGNOSIS — C787 Secondary malignant neoplasm of liver and intrahepatic bile duct: Secondary | ICD-10-CM

## 2014-10-06 DIAGNOSIS — Z95828 Presence of other vascular implants and grafts: Secondary | ICD-10-CM

## 2014-10-06 DIAGNOSIS — R630 Anorexia: Secondary | ICD-10-CM

## 2014-10-06 MED ORDER — ONDANSETRON 8 MG/50ML IVPB (CHCC)
8.0000 mg | Freq: Once | INTRAVENOUS | Status: AC
  Administered 2014-10-06: 8 mg via INTRAVENOUS

## 2014-10-06 MED ORDER — HEPARIN SOD (PORK) LOCK FLUSH 100 UNIT/ML IV SOLN
500.0000 [IU] | Freq: Once | INTRAVENOUS | Status: AC
  Administered 2014-10-06: 500 [IU] via INTRAVENOUS
  Filled 2014-10-06: qty 5

## 2014-10-06 MED ORDER — ONDANSETRON 8 MG/NS 50 ML IVPB
INTRAVENOUS | Status: AC
  Filled 2014-10-06: qty 8

## 2014-10-06 MED ORDER — SODIUM CHLORIDE 0.9 % IV SOLN
INTRAVENOUS | Status: AC
  Administered 2014-10-06: 15:00:00 via INTRAVENOUS

## 2014-10-06 MED ORDER — SODIUM CHLORIDE 0.9 % IJ SOLN
10.0000 mL | INTRAMUSCULAR | Status: DC | PRN
  Administered 2014-10-06: 10 mL via INTRAVENOUS
  Filled 2014-10-06: qty 10

## 2014-10-06 NOTE — Patient Instructions (Signed)
Dehydration, Adult Dehydration is when you lose more fluids from the body than you take in. Vital organs like the kidneys, brain, and heart cannot function without a proper amount of fluids and salt. Any loss of fluids from the body can cause dehydration.  CAUSES   Vomiting.  Diarrhea.  Excessive sweating.  Excessive urine output.  Fever. SYMPTOMS  Mild dehydration  Thirst.  Dry lips.  Slightly dry mouth. Moderate dehydration  Very dry mouth.  Sunken eyes.  Skin does not bounce back quickly when lightly pinched and released.  Dark urine and decreased urine production.  Decreased tear production.  Headache. Severe dehydration  Very dry mouth.  Extreme thirst.  Rapid, weak pulse (more than 100 beats per minute at rest).  Cold hands and feet.  Not able to sweat in spite of heat and temperature.  Rapid breathing.  Blue lips.  Confusion and lethargy.  Difficulty being awakened.  Minimal urine production.  No tears. DIAGNOSIS  Your caregiver will diagnose dehydration based on your symptoms and your exam. Blood and urine tests will help confirm the diagnosis. The diagnostic evaluation should also identify the cause of dehydration. TREATMENT  Treatment of mild or moderate dehydration can often be done at home by increasing the amount of fluids that you drink. It is best to drink small amounts of fluid more often. Drinking too much at one time can make vomiting worse. Refer to the home care instructions below. Severe dehydration needs to be treated at the hospital where you will probably be given intravenous (IV) fluids that contain water and electrolytes. HOME CARE INSTRUCTIONS   Ask your caregiver about specific rehydration instructions.  Drink enough fluids to keep your urine clear or pale yellow.  Drink small amounts frequently if you have nausea and vomiting.  Eat as you normally do.  Avoid:  Foods or drinks high in sugar.  Carbonated  drinks.  Juice.  Extremely hot or cold fluids.  Drinks with caffeine.  Fatty, greasy foods.  Alcohol.  Tobacco.  Overeating.  Gelatin desserts.  Wash your hands well to avoid spreading bacteria and viruses.  Only take over-the-counter or prescription medicines for pain, discomfort, or fever as directed by your caregiver.  Ask your caregiver if you should continue all prescribed and over-the-counter medicines.  Keep all follow-up appointments with your caregiver. SEEK MEDICAL CARE IF:  You have abdominal pain and it increases or stays in one area (localizes).  You have a rash, stiff neck, or severe headache.  You are irritable, sleepy, or difficult to awaken.  You are weak, dizzy, or extremely thirsty. SEEK IMMEDIATE MEDICAL CARE IF:   You are unable to keep fluids down or you get worse despite treatment.  You have frequent episodes of vomiting or diarrhea.  You have blood or green matter (bile) in your vomit.  You have blood in your stool or your stool looks black and tarry.  You have not urinated in 6 to 8 hours, or you have only urinated a small amount of very dark urine.  You have a fever.  You faint. MAKE SURE YOU:   Understand these instructions.  Will watch your condition.  Will get help right away if you are not doing well or get worse. Document Released: 10/20/2005 Document Revised: 01/12/2012 Document Reviewed: 06/09/2011 ExitCare Patient Information 2015 [employer]. This information is not intended to replace advice given to you by your health care provider. Make sure you discuss any questions you have with your health care   provider.  

## 2014-10-09 ENCOUNTER — Ambulatory Visit (HOSPITAL_BASED_OUTPATIENT_CLINIC_OR_DEPARTMENT_OTHER): Payer: BC Managed Care – PPO

## 2014-10-09 ENCOUNTER — Other Ambulatory Visit: Payer: Self-pay | Admitting: Nurse Practitioner

## 2014-10-09 ENCOUNTER — Ambulatory Visit (HOSPITAL_BASED_OUTPATIENT_CLINIC_OR_DEPARTMENT_OTHER): Payer: BC Managed Care – PPO | Admitting: Nurse Practitioner

## 2014-10-09 ENCOUNTER — Encounter: Payer: Self-pay | Admitting: Nurse Practitioner

## 2014-10-09 VITALS — BP 121/72 | HR 93 | Temp 98.7°F | Resp 16

## 2014-10-09 DIAGNOSIS — E860 Dehydration: Secondary | ICD-10-CM

## 2014-10-09 DIAGNOSIS — C189 Malignant neoplasm of colon, unspecified: Secondary | ICD-10-CM

## 2014-10-09 DIAGNOSIS — R112 Nausea with vomiting, unspecified: Secondary | ICD-10-CM

## 2014-10-09 DIAGNOSIS — C787 Secondary malignant neoplasm of liver and intrahepatic bile duct: Secondary | ICD-10-CM

## 2014-10-09 DIAGNOSIS — K5900 Constipation, unspecified: Secondary | ICD-10-CM

## 2014-10-09 DIAGNOSIS — G893 Neoplasm related pain (acute) (chronic): Secondary | ICD-10-CM

## 2014-10-09 DIAGNOSIS — C184 Malignant neoplasm of transverse colon: Secondary | ICD-10-CM

## 2014-10-09 DIAGNOSIS — R630 Anorexia: Secondary | ICD-10-CM

## 2014-10-09 MED ORDER — SODIUM CHLORIDE 0.9 % IJ SOLN
10.0000 mL | INTRAMUSCULAR | Status: DC | PRN
  Administered 2014-10-09: 10 mL via INTRAVENOUS
  Filled 2014-10-09: qty 10

## 2014-10-09 MED ORDER — LORAZEPAM 2 MG/ML IJ SOLN
INTRAMUSCULAR | Status: AC
  Filled 2014-10-09: qty 1

## 2014-10-09 MED ORDER — ONDANSETRON 8 MG/50ML IVPB (CHCC): 8.0000 mg | Freq: Once | INTRAVENOUS | Status: DC

## 2014-10-09 MED ORDER — HEPARIN SOD (PORK) LOCK FLUSH 100 UNIT/ML IV SOLN
500.0000 [IU] | Freq: Once | INTRAVENOUS | Status: AC
  Administered 2014-10-09: 500 [IU] via INTRAVENOUS
  Filled 2014-10-09: qty 5

## 2014-10-09 MED ORDER — ONDANSETRON 8 MG/50ML IVPB (CHCC)
8.0000 mg | Freq: Once | INTRAVENOUS | Status: AC
  Administered 2014-10-09: 8 mg via INTRAVENOUS

## 2014-10-09 MED ORDER — LORAZEPAM 2 MG/ML IJ SOLN
0.5000 mg | Freq: Once | INTRAMUSCULAR | Status: AC
Start: 2014-10-09 — End: 2014-10-09
  Administered 2014-10-09: 0.5 mg via INTRAVENOUS

## 2014-10-09 MED ORDER — OXYCODONE-ACETAMINOPHEN 5-325 MG PO TABS
ORAL_TABLET | ORAL | Status: AC
  Filled 2014-10-09: qty 1

## 2014-10-09 MED ORDER — LORAZEPAM 2 MG/ML IJ SOLN: 0.5000 mg | Freq: Once | INTRAMUSCULAR | Status: DC

## 2014-10-09 MED ORDER — SODIUM CHLORIDE 0.9 % IV SOLN
Freq: Once | INTRAVENOUS | Status: AC
  Administered 2014-10-09: 16:00:00 via INTRAVENOUS

## 2014-10-09 MED ORDER — SODIUM CHLORIDE 0.9 % IV SOLN: INTRAVENOUS | Status: AC

## 2014-10-09 MED ORDER — OXYCODONE-ACETAMINOPHEN 5-325 MG PO TABS
1.0000 | ORAL_TABLET | Freq: Once | ORAL | Status: AC
  Administered 2014-10-09: 1 via ORAL

## 2014-10-09 NOTE — Assessment & Plan Note (Signed)
Patient continues to complain of intermittent nausea senna: Occasional vomiting.  Patient does take both Zofran and Ativan at home on intermittent basis for treatment of her nausea.  Patient was given both Zofran and Ativan IV while in the infusion center today.

## 2014-10-09 NOTE — Assessment & Plan Note (Signed)
Patient continues to complain of minimal appetite; and has little oral intake.  Patient does however appear slightly stronger at each exam.  Patient will receive 1 L normal saline fluid rehydration today.  She has plans to return this coming Monday, 10/09/2014 for additional IV fluid rehydration.

## 2014-10-09 NOTE — Progress Notes (Signed)
will   SYMPTOM MANAGEMENT CLINIC   HPI: Natalie Mccarthy 58 y.o. female diagnosed with colon cancer.  Currently under active observation only.  Patient is status post protocol therapy study drug per to hospital most recently.  Patient obtained a restaging scan in early November 2015 which revealed progression of her disease to the lung and liver.  All further therapy drug was discontinued at that time.  The patient has plans to initiate oral therapy with Lonsurf when she becomes stronger.  Patient presented to the [facility] today for additional IV fluid rehydration.  She continues to feel slightly stronger throughout the week; but continues with some chronic nausea and occasional vomiting.  She also continues with some constipation; despite taking both stool softeners and Corrin Parker on a regular basis.  She denies any recent fevers or chills.  HPI  CURRENT THERAPY: Upcoming Treatment Dates - COLORECTAL Irinotecan q14d Days with orders from any treatment category:  06/13/2014      SCHEDULING COMMUNICATION      LORazepam (ATIVAN) injection 0.5 mg      palonosetron (ALOXI) injection 0.25 mg      dexamethasone (DECADRON) injection 10 mg      irinotecan (CAMPTOSAR) 180 mg in dextrose 5 % 500 mL chemo infusion      atropine injection 0.5 mg      sodium chloride 0.9 % injection 10 mL      heparin lock flush 100 unit/mL      heparin lock flush 100 unit/mL      alteplase (CATHFLO ACTIVASE) injection 2 mg      sodium chloride 0.9 % injection 3 mL      Cold Pack 1 packet      0.9 %  sodium chloride infusion      TREATMENT CONDITIONS    ROS  Past Medical History  Diagnosis Date  . History of migraine headaches   . History of mononucleosis   . History of recurrent miscarriages, not currently pregnant   . Microcytic anemia   . Colon cancer 12/2010    metastatic  adenocarcinoma w/liver masses  . PONV (postoperative nausea and vomiting)   . Chemotherapy adverse reaction 04-07-13   last tx. 1'14- did caused platelet to drop 120's, now climbing up    Past Surgical History  Procedure Laterality Date  . Shoulder surgery  2009    Left shoulder  . Tonsillectomy      as child  . Portacath placement  12/20/2010    remains on right chest  . Ercp N/A 04/12/2013    Procedure: ENDOSCOPIC RETROGRADE CHOLANGIOPANCREATOGRAPHY (ERCP);  Surgeon: Jeryl Columbia, MD;  Location: [facility];  Service: Endoscopy;  Laterality: N/A;    has Colon cancer; Dehydration; Anorexia; Nausea with vomiting; Neoplasm related pain; and Constipation on her problem list.     is allergic to compazine and lidocaine.    Medication List       This list is accurate as of: 10/06/14 11:59 PM.  Always use your most recent med list.               Adapalene-Benzoyl Peroxide 0.1-2.5 % gel  Apply 1 application topically at bedtime.     ALPRAZolam 0.5 MG tablet  Commonly known as:  XANAX  Take 1 tablet (0.5 mg total) by mouth every 8 (eight) hours as needed for anxiety or sleep.     clindamycin 1 % lotion  Commonly known as:  CLEOCIN T     clobetasol  0.05 % external solution  Commonly known as:  TEMOVATE     fentaNYL 25 MCG/HR patch  Commonly known as:  DURAGESIC - dosed mcg/hr  Place 1 patch (25 mcg total) onto the skin every 3 (three) days.     fluticasone 0.05 % cream  Commonly known as:  CUTIVATE  Apply topically 2 (two) times daily as needed. Apply to affected areas twice daily as needed     loperamide 2 MG capsule  Commonly known as:  IMODIUM  Take 2 mg by mouth as needed for diarrhea or loose stools.     LORazepam 1 MG tablet  Commonly known as:  ATIVAN  Take .5-1.0 mg SL or PO  every 6 hours as needed for nausea     magic mouthwash Soln  Take 5 mLs by mouth every 4 (four) hours as needed for mouth pain     magnesium oxide 400 (241.3 MG) MG tablet  Commonly known as:  MAG-OX     minocycline 100 MG capsule  Commonly known as:  MINOCIN,DYNACIN     ondansetron 8 MG tablet    Commonly known as:  ZOFRAN  Take 1 tablet (8 mg total) by mouth every 8 (eight) hours as needed for nausea.     oxyCODONE-acetaminophen 5-325 MG per tablet  Commonly known as:  PERCOCET/ROXICET  Take 1-2 tablets by mouth every 4 (four) hours as needed for severe pain.     SUMAtriptan 100 MG tablet  Commonly known as:  IMITREX  Take 100 mg by mouth daily as needed for migraine. May repeat in 2 hours if headache persists or recurs.     traMADol 50 MG tablet  Commonly known as:  ULTRAM  Take 1 tablet (50 mg total) by mouth every 6 (six) hours as needed for moderate pain.     trifluridine-tipiracil 15-6.14 MG tablet  Commonly known as:  LONSURF  Take 3 tablets (=45mg ) twice a day.  Take on days 1-5 and 8-12.  Start date 09/25/14     ZANTAC 150 MG tablet  Generic drug:  ranitidine  Take 150 mg by mouth daily.         PHYSICAL EXAMINATION  Blood pressure 139/81, pulse 94, temperature 97.6 F (36.4 C), temperature source Oral, resp. rate 18, height 5\' 7"  (1.702 m), weight 144 lb 8 oz (65.545 kg).  Physical Exam  Constitutional: She is oriented to person, place, and time. Vital signs are normal. She appears dehydrated. She appears unhealthy.  HENT:  Head: Normocephalic and atraumatic.  Mouth/Throat: Oropharynx is clear and moist.  Eyes: Conjunctivae and EOM are normal. Pupils are equal, round, and reactive to light. Right eye exhibits no discharge. Left eye exhibits no discharge. No scleral icterus.  Neck: Normal range of motion. Neck supple. No JVD present. No tracheal deviation present. No thyromegaly present.  Cardiovascular: Normal rate, regular rhythm, normal heart sounds and intact distal pulses.   Pulmonary/Chest: Effort normal and breath sounds normal. No respiratory distress. She has no wheezes. She has no rales.  Abdominal: Soft. Bowel sounds are normal. She exhibits distension. She exhibits no mass. There is no tenderness. There is no rebound and no guarding.  Trace  abdominal distention noted on exam today.  No tenderness to abdomen with palpation.  Musculoskeletal: Normal range of motion. She exhibits no edema or tenderness.  Lymphadenopathy:    She has no cervical adenopathy.  Neurological: She is alert and oriented to person, place, and time. Gait normal.  Skin: Skin is  warm and dry. No rash noted. No erythema. There is pallor.  Psychiatric: Affect normal.  Nursing note and vitals reviewed.   LABORATORY DATA:. Appointment on 10/04/2014  Component Date Value Ref Range Status  . WBC 10/04/2014 4.8  3.9 - 10.3 10e3/uL Final  . NEUT# 10/04/2014 4.0  1.5 - 6.5 10e3/uL Final  . HGB 10/04/2014 10.2* 11.6 - 15.9 g/dL Final  . HCT 10/04/2014 32.2* 34.8 - 46.6 % Final  . Platelets 10/04/2014 171  145 - 400 10e3/uL Final  . MCV 10/04/2014 82.2  79.5 - 101.0 fL Final  . MCH 10/04/2014 26.0  25.1 - 34.0 pg Final  . MCHC 10/04/2014 31.7  31.5 - 36.0 g/dL Final  . RBC 10/04/2014 3.92  3.70 - 5.45 10e6/uL Final  . RDW 10/04/2014 24.6* 11.2 - 14.5 % Final  . lymph# 10/04/2014 0.2* 0.9 - 3.3 10e3/uL Final  . MONO# 10/04/2014 0.5  0.1 - 0.9 10e3/uL Final  . Eosinophils Absolute 10/04/2014 0.0  0.0 - 0.5 10e3/uL Final  . Basophils Absolute 10/04/2014 0.0  0.0 - 0.1 10e3/uL Final  . NEUT% 10/04/2014 83.7* 38.4 - 76.8 % Final  . LYMPH% 10/04/2014 4.5* 14.0 - 49.7 % Final  . MONO% 10/04/2014 10.8  0.0 - 14.0 % Final  . EOS% 10/04/2014 0.7  0.0 - 7.0 % Final  . BASO% 10/04/2014 0.3  0.0 - 2.0 % Final  . Sodium 10/04/2014 136  136 - 145 mEq/L Final  . Potassium 10/04/2014 3.9  3.5 - 5.1 mEq/L Final  . Chloride 10/04/2014 101  98 - 109 mEq/L Final  . CO2 10/04/2014 25  22 - 29 mEq/L Final  . Glucose 10/04/2014 125  70 - 140 mg/dl Final  . BUN 10/04/2014 8.0  7.0 - 26.0 mg/dL Final  . Creatinine 10/04/2014 0.6  0.6 - 1.1 mg/dL Final  . Total Bilirubin 10/04/2014 1.30* 0.20 - 1.20 mg/dL Final  . Alkaline Phosphatase 10/04/2014 439* 40 - 150 U/L Final  . AST  10/04/2014 88* 5 - 34 U/L Final  . ALT 10/04/2014 19  0 - 55 U/L Final  . Total Protein 10/04/2014 6.6  6.4 - 8.3 g/dL Final  . Albumin 10/04/2014 2.5* 3.5 - 5.0 g/dL Final  . Calcium 10/04/2014 8.6  8.4 - 10.4 mg/dL Final  . Anion Gap 10/04/2014 10  3 - 11 mEq/L Final     RADIOGRAPHIC STUDIES: No results found.  ASSESSMENT/PLAN:    Anorexia Patient continues with minimal appetite.  Most likely this is secondary to both her cancer and hematuria be side effect.  Patient was encouraged to eat multiple small meals throughout the day.  Patient was also advised to premedicate herself with anti-emetics as well.  Colon cancer Patient is status post protocol therapy study drug per [facility].  Patient obtained a restaging scan in early November 2015 which revealed progression of his disease to the lung and liver.  All further therapy drug was discontinued at that time.  Patient continues to hold on initiation of oral therapy drug Lonsurf until she becomes stronger.  Constipation Patient continues to complain of some chronic constipation.  She states that she has been taking the stool softeners and Maalox as previously directed.  She states that when she does have a bowel movement it is typically very soft and non-formed.  Most likely, constipation is secondary to narcotic pain medication administration.  We'll continue to monitor closely.  Dehydration Patient continues to complain of minimal appetite; and has little oral  intake.  Patient does however appear slightly stronger at each exam.  Patient will receive 1 L normal saline fluid rehydration today.  She has plans to return this coming Monday, 10/09/2014 for additional IV fluid rehydration.  Nausea with vomiting Patient continues to complain of intermittent nausea senna: Occasional vomiting.  Patient does take both Zofran and Ativan at home on intermittent basis for treatment of her nausea.  Patient was given both Zofran and Ativan IV while  in the infusion center today.  Neoplasm related pain Since switching to a sentinel patch earlier this week; patient states that her pain is much better under control.  She only occasionally takes a Percocet for breakthrough pain now.  Patient stated understanding of all instructions; and was in agreement with this plan of care. The patient knows to call the clinic with any problems, questions or concerns.   Review/collaboration with Dr. Benay Spice regarding all aspects of patient's visit today.   Total time spent with patient 25 minutes;  with greater than 80 percent of that time spent in face to face counseling regarding her symptoms,dination of care and follow up.  Disclaimer: This note was dictated with voice recognition software. Similar sounding words can inadvertently be transcribed and may not be corrected upon review.   Drue Second, NP 10/09/2014

## 2014-10-09 NOTE — Assessment & Plan Note (Signed)
Patient is status post protocol therapy study drug per [facility].  Patient obtained a restaging scan in early November 2015 which revealed progression of his disease to the lung and liver.  All further therapy drug was discontinued at that time.  Patient continues to hold on initiation of oral therapy drug Lonsurf until she becomes stronger.

## 2014-10-09 NOTE — Assessment & Plan Note (Signed)
Patient continues to complain of some chronic constipation.  She states that she has been taking the stool softeners and Maalox as previously directed.  She states that when she does have a bowel movement it is typically very soft and non-formed.  Most likely, constipation is secondary to narcotic pain medication administration.  We'll continue to monitor closely.

## 2014-10-09 NOTE — Assessment & Plan Note (Signed)
Patient continues with minimal appetite.  Most likely this is secondary to both her cancer and hematuria be side effect.  Patient was encouraged to eat multiple small meals throughout the day.  Patient was also advised to premedicate herself with anti-emetics as well.

## 2014-10-09 NOTE — Patient Instructions (Signed)
Dehydration, Adult Dehydration is when you lose more fluids from the body than you take in. Vital organs like the kidneys, brain, and heart cannot function without a proper amount of fluids and salt. Any loss of fluids from the body can cause dehydration.  CAUSES   Vomiting.  Diarrhea.  Excessive sweating.  Excessive urine output.  Fever. SYMPTOMS  Mild dehydration  Thirst.  Dry lips.  Slightly dry mouth. Moderate dehydration  Very dry mouth.  Sunken eyes.  Skin does not bounce back quickly when lightly pinched and released.  Dark urine and decreased urine production.  Decreased tear production.  Headache. Severe dehydration  Very dry mouth.  Extreme thirst.  Rapid, weak pulse (more than 100 beats per minute at rest).  Cold hands and feet.  Not able to sweat in spite of heat and temperature.  Rapid breathing.  Blue lips.  Confusion and lethargy.  Difficulty being awakened.  Minimal urine production.  No tears. DIAGNOSIS  Your caregiver will diagnose dehydration based on your symptoms and your exam. Blood and urine tests will help confirm the diagnosis. The diagnostic evaluation should also identify the cause of dehydration. TREATMENT  Treatment of mild or moderate dehydration can often be done at home by increasing the amount of fluids that you drink. It is best to drink small amounts of fluid more often. Drinking too much at one time can make vomiting worse. Refer to the home care instructions below. Severe dehydration needs to be treated at the hospital where you will probably be given intravenous (IV) fluids that contain water and electrolytes. HOME CARE INSTRUCTIONS   Ask your caregiver about specific rehydration instructions.  Drink enough fluids to keep your urine clear or pale yellow.  Drink small amounts frequently if you have nausea and vomiting.  Eat as you normally do.  Avoid:  Foods or drinks high in sugar.  Carbonated  drinks.  Juice.  Extremely hot or cold fluids.  Drinks with caffeine.  Fatty, greasy foods.  Alcohol.  Tobacco.  Overeating.  Gelatin desserts.  Wash your hands well to avoid spreading bacteria and viruses.  Only take over-the-counter or prescription medicines for pain, discomfort, or fever as directed by your caregiver.  Ask your caregiver if you should continue all prescribed and over-the-counter medicines.  Keep all follow-up appointments with your caregiver. SEEK MEDICAL CARE IF:  You have abdominal pain and it increases or stays in one area (localizes).  You have a rash, stiff neck, or severe headache.  You are irritable, sleepy, or difficult to awaken.  You are weak, dizzy, or extremely thirsty. SEEK IMMEDIATE MEDICAL CARE IF:   You are unable to keep fluids down or you get worse despite treatment.  You have frequent episodes of vomiting or diarrhea.  You have blood or green matter (bile) in your vomit.  You have blood in your stool or your stool looks black and tarry.  You have not urinated in 6 to 8 hours, or you have only urinated a small amount of very dark urine.  You have a fever.  You faint. MAKE SURE YOU:   Understand these instructions.  Will watch your condition.  Will get help right away if you are not doing well or get worse. Document Released: 10/20/2005 Document Revised: 01/12/2012 Document Reviewed: 06/09/2011 ExitCare Patient Information 2015 [employer]. This information is not intended to replace advice given to you by your health care provider. Make sure you discuss any questions you have with your health care   provider.  

## 2014-10-09 NOTE — Assessment & Plan Note (Signed)
Since switching to a sentinel patch earlier this week; patient states that her pain is much better under control.  She only occasionally takes a Percocet for breakthrough pain now.

## 2014-10-10 ENCOUNTER — Ambulatory Visit (HOSPITAL_BASED_OUTPATIENT_CLINIC_OR_DEPARTMENT_OTHER): Payer: BC Managed Care – PPO | Admitting: Nurse Practitioner

## 2014-10-10 ENCOUNTER — Ambulatory Visit (HOSPITAL_COMMUNITY)
Admission: RE | Admit: 2014-10-10 | Discharge: 2014-10-10 | Disposition: A | Discharge status: Home / Self Care | Payer: BC Managed Care – PPO | Source: Ambulatory Visit | Attending: Nurse Practitioner | Admitting: Nurse Practitioner

## 2014-10-10 ENCOUNTER — Other Ambulatory Visit: Payer: Self-pay | Admitting: Nurse Practitioner

## 2014-10-10 ENCOUNTER — Other Ambulatory Visit (HOSPITAL_BASED_OUTPATIENT_CLINIC_OR_DEPARTMENT_OTHER): Payer: BC Managed Care – PPO

## 2014-10-10 ENCOUNTER — Telehealth: Payer: Self-pay | Admitting: Nurse Practitioner

## 2014-10-10 ENCOUNTER — Encounter: Payer: Self-pay | Admitting: Nurse Practitioner

## 2014-10-10 VITALS — BP 122/69 | HR 98 | Temp 98.3°F | Resp 18 | Ht 67.0 in | Wt 150.0 lb

## 2014-10-10 DIAGNOSIS — R6881 Early satiety: Secondary | ICD-10-CM

## 2014-10-10 DIAGNOSIS — C189 Malignant neoplasm of colon, unspecified: Secondary | ICD-10-CM

## 2014-10-10 DIAGNOSIS — R188 Other ascites: Secondary | ICD-10-CM | POA: Insufficient documentation

## 2014-10-10 DIAGNOSIS — R21 Rash and other nonspecific skin eruption: Secondary | ICD-10-CM

## 2014-10-10 DIAGNOSIS — C184 Malignant neoplasm of transverse colon: Secondary | ICD-10-CM

## 2014-10-10 DIAGNOSIS — R634 Abnormal weight loss: Secondary | ICD-10-CM

## 2014-10-10 DIAGNOSIS — L98499 Non-pressure chronic ulcer of skin of other sites with unspecified severity: Secondary | ICD-10-CM

## 2014-10-10 DIAGNOSIS — K5900 Constipation, unspecified: Secondary | ICD-10-CM

## 2014-10-10 DIAGNOSIS — M2550 Pain in unspecified joint: Secondary | ICD-10-CM

## 2014-10-10 DIAGNOSIS — C787 Secondary malignant neoplasm of liver and intrahepatic bile duct: Secondary | ICD-10-CM

## 2014-10-10 DIAGNOSIS — R630 Anorexia: Secondary | ICD-10-CM

## 2014-10-10 DIAGNOSIS — R109 Unspecified abdominal pain: Secondary | ICD-10-CM

## 2014-10-10 LAB — CBC WITH DIFFERENTIAL/PLATELET
BASO%: 1 % (ref 0.0–2.0)
Basophils Absolute: 0.1 10*3/uL (ref 0.0–0.1)
EOS%: 1.1 % (ref 0.0–7.0)
Eosinophils Absolute: 0.1 10*3/uL (ref 0.0–0.5)
HCT: 35.2 % (ref 34.8–46.6)
HGB: 11.2 g/dL — ABNORMAL LOW (ref 11.6–15.9)
LYMPH#: 0.3 10*3/uL — AB (ref 0.9–3.3)
LYMPH%: 4.4 % — ABNORMAL LOW (ref 14.0–49.7)
MCH: 26.1 pg (ref 25.1–34.0)
MCHC: 31.8 g/dL (ref 31.5–36.0)
MCV: 82 fL (ref 79.5–101.0)
MONO#: 0.7 10*3/uL (ref 0.1–0.9)
MONO%: 10.3 % (ref 0.0–14.0)
NEUT#: 5.5 10*3/uL (ref 1.5–6.5)
NEUT%: 83.2 % — ABNORMAL HIGH (ref 38.4–76.8)
Platelets: 212 10*3/uL (ref 145–400)
RBC: 4.29 10*6/uL (ref 3.70–5.45)
RDW: 23.6 % — AB (ref 11.2–14.5)
WBC: 6.6 10*3/uL (ref 3.9–10.3)

## 2014-10-10 LAB — HEPATIC FUNCTION PANEL
ALT: 21 U/L (ref 0–35)
AST: 103 U/L — ABNORMAL HIGH (ref 0–37)
Albumin: 3 g/dL — ABNORMAL LOW (ref 3.5–5.2)
Alkaline Phosphatase: 389 U/L — ABNORMAL HIGH (ref 39–117)
BILIRUBIN DIRECT: 1.1 mg/dL — AB (ref 0.0–0.3)
BILIRUBIN INDIRECT: 0.9 mg/dL (ref 0.2–1.2)
BILIRUBIN TOTAL: 2 mg/dL — AB (ref 0.2–1.2)
Total Protein: 6.6 g/dL (ref 6.0–8.3)

## 2014-10-10 MED ORDER — SORBITOL 70 % PO SOLN: 30.0000 mL | Freq: Two times a day (BID) | ORAL | Status: AC

## 2014-10-10 MED ORDER — FENTANYL 25 MCG/HR TD PT72: 25.0000 ug | MEDICATED_PATCH | TRANSDERMAL | Status: AC

## 2014-10-10 NOTE — Assessment & Plan Note (Signed)
Patient continues to complain of some chronic constipation. Most likely, constipation is secondary to narcotic pain medication administration.   She states that she has been taking the stool softeners and Maalox as previously directed.  She states that when she does have a bowel movement it is typically very soft and non-formed. Abdomen does appear more distended; and slightly firm today on exam as compared to previous exams last week of abdomen.   Will continue to monitor closely.

## 2014-10-10 NOTE — Assessment & Plan Note (Signed)
Patient continues with minimal appetite.  Most likely this is secondary to both her cancer and hematuria be side effect.  Patient was encouraged to eat multiple small meals throughout the day.  Patient was also advised to premedicate herself with anti-emetics as well.

## 2014-10-10 NOTE — Progress Notes (Addendum)
[facility] OFFICE PROGRESS NOTE   Diagnosis:  Colon cancer  INTERVAL HISTORY:   Ms. Ruppe returns as scheduled. She has received IV fluids on multiple occasions over the past week. Her appetite continues to be poor. She does however note weight gain. She feels "bloated". She has consistent nausea with occasional vomiting. Bowels not moving on a regular basis. She is trying laxatives. She continues to have abdominal pain. She is on a fentanyl patch and occasionally takes a Percocet. She feels her pain is fairly well controlled.  Objective:  Vital signs in last 24 hours:  Blood pressure 122/69, pulse 98, temperature 98.3 F (36.8 C), temperature source Oral, resp. rate 18, height _0  (1.702 m), weight 150 lb (68.04 kg).    HEENT: no thrush. Resp: lungs are clear. Breath sounds diminished at the right base. No respiratory distress. Cardio: regular rate and rhythm. GI: abdomen is markedly distended. Bowel sounds active. Liver palpable right upper abdomen extending across midline. She appears to have ascites. Large external hemorrhoids. No stool in the rectal vault. Vascular: 1+ pitting pretibial edema bilaterally.  Skin: skin appears dry.    Lab Results:  Lab Results  Component Value Date   WBC 6.6 10/10/2014   HGB 11.2* 10/10/2014   HCT 35.2 10/10/2014   MCV 82.0 10/10/2014   PLT 212 10/10/2014   NEUTROABS 5.5 10/10/2014    Imaging:  No results found.  Medications: I have reviewed the patient's current medications.  Assessment/Plan: 1.Metastatic colon cancer, K-ras wild-type, microsatellite stable, no BRAF mutation , no mismatch repair protein mutation on [facility] 1 testing, with multiple liver metastases noted on an MRI of the abdomen 12/02/2010 and on a CT 12/13/2010. A transverse colon mass was noted on the CT from 02/11/2011 and confirmed on a colonoscopy 12/17/2010. She began treatment with FOLFIRI/Avastin 12/31/2010. A restaging CT 06/20/2011  confirmed further improvement in the metastatic liver lesions and no evidence for progressive metastatic disease. A restaging CT 08/01/2011 showed stable liver lesions and no new lesions. Restaging CT 09/12/2011 showed stable liver lesions and no new lesions. A restaging CT 10/27/2011 showed stable liver lesions and no new lesions. Restaging CT 12/05/2011 showed mild decrease in size of hepatic metastases and no evidence of disease progression. Restaging CT 01/26/2012 showed stable hepatic metastases and no new lesions. Restaging CT may 5/8/ 2013 revealed a decreased size of hepatic metastases and no new lesions. Restaging CT 04/23/2012 revealed no change in the measurable "target" lesions, a slight increase in the size of other lesions, and no new lesions. Restaging CT 06/04/2012 showed multiple hepatic metastases were grossly unchanged. Restaging CT 07/19/2012 with stable disease in the liver. Infusional 5-fluorouracil continued on a 2 week schedule. Treatment was held after the 08/03/2012 cycle due to thrombocytopenia. Restaging CT evaluation 08/30/2012 with mild increase in size of large hepatic metastases. Treatment was resumed with 5 fluorouracil and Avastin on 08/31/2012. Restaging CT 10/14/2012-stable hepatic metastases. Treatment continued with 5 fluorouracil and Avastin. Restaging CT 12/01/2012 revealed mild progression in the size of multiple hepatic lesions. Treatment was placed on hold. CEA 32.1 on 01/06/2013 and 71.9 on 03/11/2013. Restaging CT abdomen/pelvis 03/31/2013 with interval progression of liver metastases. She began cycle 1 CAPOX on 04/15/2013. The Xeloda was prematurely discontinued on 04/25/2013 due to redness and pain over the feet. She completed cycle 2 beginning 05/24/2013 and cycle 3 beginning on 07/05/2013. Disease progression presenting with severe abdominal pain. She began irinotecan/Panitumumab 09/05/2013. CEA markedly improved at 5.0 on 11/15/2013.  Restaging CT 11/25/2013 with  improvement in hepatic metastases and abdominal lymphadenopathy.   Irinotecan/Panitumumab continued.   Restaging CT 03/31/2014 with stable to slight improvement in liver metastases.   Irinotecan/Panitumumab continued.   Panitumumab held 05/16/2014 secondary to a severe scalp rash.   Irinotecan continued.  Restaging CT 06/09/2012 with progressive metastatic disease in the lungs, lymph nodes, and liver  Initiation of treatment on the panitumumab/cabozantinib study at [facility] 07/05/2014  Restaging CT at [facility] 09/13/2014 revealed progression of metastatic disease in the lungs and liver  cabozantinib and panitumumab discontinued 09/13/2014 2. Abdominal pain-progressive, secondary to liver metastases  3. Anorexia, early satiety, and weight loss secondary to metastatic colon cancerand chemotherapy 4. Microcytic anemia. Resolved.  5. History of migraine headaches.  6. Status post uterine fibroid surgery.  7. Port-A-Cath placement 12/20/2010.  8. Delayed nausea following cycle 1 of FOLFIRI/Avastin. Improved with the addition of Aloxi and prophylactic Decadron beginning with cycle #2.  9. Neutropenia secondary to chemotherapy. Cycle #4 was held on 02/11/2011. She subsequently received Neulasta support.  10. History of intermittent fever with no apparent source for infection, likely "tumor fever" or fever related to chemotherapy. No recent fever.  11. History of mucositis secondary to 5-FU, initially grade 2. Irinotecan and 5-FU were dose reduced per study guidelines.  12. History of abdominal cramping. ? Related to irinotecan, 5-FU, or potentially the colon tumor. No recent abdominal cramping.  13. Thrombocytopenia secondary to chemotherapy (while being treated with irinotecan and Avastin). Treatment was held for thrombocytopenia per study guidelines. The thrombocytopenia may be related to Avastin, or chronic liver disease from chemotherapy. The CT on 04/23/2012 confirmed mild  splenomegaly. The CT on 06/04/2012 again showed splenomegaly. The thrombocytopenia persisted despite being off of irinotecan and Avastin for several months. The thrombocytopenia improved. She developed thrombocytopenia following cycle 1 and cycle 2 CAPOX. She again has thrombocytopenia. Likely related to chronic liver disease and splenomegaly.  42. Arthralgias-most likely unrelated to the colon cancer diagnosis and chemotherapy. She has been evaluated by orthopedics. The etiology of the arthralgias is unclear.  16. Persistent discomfort at the right shoulder. Negative CT of the right upper extremity on 07/19/2012. MRI on 08/06/2012 with a partial thickness bursal surface tear of the supraspinatus tendon, moderate rotator cuff tendinopathy/tendinosis. She is now followed by orthopedics. The pain has resolved. The range of motion at the right shoulder has improved  17.? Swelling at the right upper extremity and chest wall April 2014-negative right upper extremity Doppler.  18. Delayed nausea following cycle 1 CAPOX. Emend and Aloxi were added with cycle 2.  19. Mucositis and hand-foot syndrome secondary to Xeloda. The Xeloda was dose reduced to 1000 mg twice daily for 14 days with cycle 2.  20. Thrombocytopenia following cycle 1 CAPOX. Cycle 2 and cycle 3 were delayed due to thrombocytopenia. The oxaliplatin was further dose reduced with cycle 3.  21.New onset pruritus 04/04/2013-hyperbilirubinemia and an elevated alkaline phosphatase noted on labs 04/06/2013, the CT 03/31/2013 revealed peripheral biliary ductal dilatation in the medial segment of the left hepatic lobe,no options for intervention after evaluation by Dr. Watt Climes. The hyperbilirubinemia initially resolved and was mildly elevated on 07/26/2013.  22. Acute severe upper abdominal pain, pleuritic, with radiation to the chest/shoulders beginning 07/25/2013-etiology unclear. Chest CT 07/25/2013 showed no evidence of an acute pulmonary embolus  and no acute or metastatic findings in the chest. CT abdomen/pelvis on 07/26/2013 showed interval slight progression of metastatic disease to the liver; no new hepatic metastases; slight progression of intrahepatic  biliary ductal dilatation; interval marked increase in the size of the spleen; interval increase in the size of a portacaval lymph node and gastrohepatic ligament lymph nodes; contracted thickwalled gallbladder possibly containing sludge. The pain is likely related to metastatic disease involving the liver with possible irritation of the diaphragm. The pain resolved.  23. Status post biopsy of a liver lesion 08/10/2013. Pathology confirmed metastatic adenocarcinoma. Extended K-ras testing returned negative for the K-ras mutation.  24. Constipation.  25. Panitumumab skin rash, with ulcerations, and paronychia. She continues minocycline and topical steroid creams, improved 26. Insomnia, anxiety and restlessness following abrupt discontinuation of Percocet. Resolved after a slow Percocet taper.  27. History of mucositis secondary to chemotherapy.  28. Acute nausea during the irinotecan infusion, lasting for approximately 24 hours. Improved with addition of Ativan to the premedication regimen.  29. Erythema over Port-A-Cath site following application of EMLA cream 11/01/2013-resolved.      Disposition: Ms. Zollars has a poor performance status. Her condition has declined over the past few weeks. She and her husband understand the decline is due to progression of the cancer. She does not appear to be a candidate for systemic therapy. Dr. Benay Spice recommends a referral to the [facility] program. Ms. Wire and her husband are in agreement. We will make a referral today.  Dr. Benay Spice discussed CPR/ACLS with Ms. Briney. She is not ready to make a decision.  She appears to have significant ascites. We are making a referral to radiology for a paracentesis.  For the constipation  she will begin sorbitol 30 mL twice daily.  We scheduled a return visit on 10/20/2014.  Patient seen with Dr. Benay Spice. 30 minutes were spent face-to-face at today's visit with the majority of that time involved in counseling/coordination of care.  Ned Card ANP/GNP-BC   10/10/2014  12:55 PM  This was a shared visit with Ned Card. Ms. Strong has a poor performance status. Her symptoms are likely related to liver metastases. She is not a candidate for further systemic therapy. I discussed the prognosis with Ms. Stencil and her husband. I recommend Hospice care. She will be referred for a palliative paracentesis today.  She agrees to a [facility] referral.  Julieanne Manson, M.D.

## 2014-10-10 NOTE — Assessment & Plan Note (Signed)
Patient continues to complain of minimal appetite; and has little oral intake.  Patient does however appear slightly stronger at each exam.  Patient will receive 1 L normal saline fluid rehydration today.

## 2014-10-10 NOTE — Assessment & Plan Note (Signed)
Patient is status post protocol therapy study drug per [facility].  Patient obtained a restaging scan in early November 2015 which revealed progression of his disease to the lung and liver.  All further therapy drug was discontinued at that time.  Patient continues to hold on initiation of oral therapy drug Lonsurf until she becomes stronger.

## 2014-10-10 NOTE — Assessment & Plan Note (Addendum)
Patient continues to complain of intermittent nausea; and occasional vomiting.  Patient does take both Zofran and Ativan at home on intermittent basis for treatment of her nausea.  Patient was given both Zofran and Ativan IV while in the infusion center today.

## 2014-10-10 NOTE — Assessment & Plan Note (Signed)
Since switching to a sentinel patch earlier this week; patient states that her pain is much better under control.  She only occasionally takes a Percocet for breakthrough pain now.

## 2014-10-10 NOTE — Progress Notes (Signed)
will   SYMPTOM MANAGEMENT CLINIC   HPI: Natalie Mccarthy 58 y.o. female diagnosed with colon cancer.  Currently under active observation only.  Patient is status post protocol therapy study drug per to hospital most recently.  Patient obtained a restaging scan in early November 2015 which revealed progression of her disease to the lung and liver.  All further therapy drug was discontinued at that time.  The patient has plans to initiate oral therapy with Lonsurf when she becomes stronger.  Patient presented to the [facility] today for additional IV fluid rehydration.  She continues to feel slightly stronger throughout the week; but continues with some chronic nausea and occasional vomiting.  She also continues with some constipation; despite taking both stool softeners and Miralax on a regular basis.  She is complaining of increased abdominal distention and slight abd firmness. She denies any recent fevers or chills.  HPI  CURRENT THERAPY: Upcoming Treatment Dates - COLORECTAL Irinotecan q14d Days with orders from any treatment category:  06/13/2014      SCHEDULING COMMUNICATION      LORazepam (ATIVAN) injection 0.5 mg      palonosetron (ALOXI) injection 0.25 mg      dexamethasone (DECADRON) injection 10 mg      irinotecan (CAMPTOSAR) 180 mg in dextrose 5 % 500 mL chemo infusion      atropine injection 0.5 mg      sodium chloride 0.9 % injection 10 mL      heparin lock flush 100 unit/mL      heparin lock flush 100 unit/mL      alteplase (CATHFLO ACTIVASE) injection 2 mg      sodium chloride 0.9 % injection 3 mL      Cold Pack 1 packet      0.9 %  sodium chloride infusion      TREATMENT CONDITIONS    ROS  Past Medical History  Diagnosis Date  . History of migraine headaches   . History of mononucleosis   . History of recurrent miscarriages, not currently pregnant   . Microcytic anemia   . Colon cancer 12/2010    metastatic  adenocarcinoma w/liver masses  . PONV  (postoperative nausea and vomiting)   . Chemotherapy adverse reaction 04-07-13    last tx. 1'14- did caused platelet to drop 120's, now climbing up    Past Surgical History  Procedure Laterality Date  . Shoulder surgery  2009    Left shoulder  . Tonsillectomy      as child  . Portacath placement  12/20/2010    remains on right chest  . Ercp N/A 04/12/2013    Procedure: ENDOSCOPIC RETROGRADE CHOLANGIOPANCREATOGRAPHY (ERCP);  Surgeon: Jeryl Columbia, MD;  Location: [facility];  Service: Endoscopy;  Laterality: N/A;    has Colon cancer; Dehydration; Anorexia; Nausea with vomiting; Neoplasm related pain; and Constipation on her problem list.     is allergic to compazine and lidocaine.    Medication List       This list is accurate as of: 10/09/14 11:59 PM.  Always use your most recent med list.               Adapalene-Benzoyl Peroxide 0.1-2.5 % gel  Apply 1 application topically at bedtime.     ALPRAZolam 0.5 MG tablet  Commonly known as:  XANAX  Take 1 tablet (0.5 mg total) by mouth every 8 (eight) hours as needed for anxiety or sleep.     clindamycin 1 % lotion  Commonly known as:  CLEOCIN T     clobetasol 0.05 % external solution  Commonly known as:  TEMOVATE     fentaNYL 25 MCG/HR patch  Commonly known as:  DURAGESIC - dosed mcg/hr  Place 1 patch (25 mcg total) onto the skin every 3 (three) days.     fluticasone 0.05 % cream  Commonly known as:  CUTIVATE  Apply topically 2 (two) times daily as needed. Apply to affected areas twice daily as needed     loperamide 2 MG capsule  Commonly known as:  IMODIUM  Take 2 mg by mouth as needed for diarrhea or loose stools.     LORazepam 1 MG tablet  Commonly known as:  ATIVAN  Take .5-1.0 mg SL or PO  every 6 hours as needed for nausea     magic mouthwash Soln  Take 5 mLs by mouth every 4 (four) hours as needed for mouth pain     magnesium oxide 400 (241.3 MG) MG tablet  Commonly known as:  MAG-OX     minocycline 100  MG capsule  Commonly known as:  MINOCIN,DYNACIN     ondansetron 8 MG tablet  Commonly known as:  ZOFRAN  Take 1 tablet (8 mg total) by mouth every 8 (eight) hours as needed for nausea.     oxyCODONE-acetaminophen 5-325 MG per tablet  Commonly known as:  PERCOCET/ROXICET  Take 1-2 tablets by mouth every 4 (four) hours as needed for severe pain.     SUMAtriptan 100 MG tablet  Commonly known as:  IMITREX  Take 100 mg by mouth daily as needed for migraine. May repeat in 2 hours if headache persists or recurs.     traMADol 50 MG tablet  Commonly known as:  ULTRAM  Take 1 tablet (50 mg total) by mouth every 6 (six) hours as needed for moderate pain.     trifluridine-tipiracil 15-6.14 MG tablet  Commonly known as:  LONSURF  Take 3 tablets (=45mg ) twice a day.  Take on days 1-5 and 8-12.  Start date 09/25/14     ZANTAC 150 MG tablet  Generic drug:  ranitidine  Take 150 mg by mouth daily.         PHYSICAL EXAMINATION  BP 121/72, HR 93, temp 98.7   Physical Exam  Constitutional: She is oriented to person, place, and time. Vital signs are normal. She appears dehydrated. She appears unhealthy.  HENT:  Head: Normocephalic and atraumatic.  Mouth/Throat: Oropharynx is clear and moist.  Eyes: Conjunctivae and EOM are normal. Pupils are equal, round, and reactive to light. Right eye exhibits no discharge. Left eye exhibits no discharge. No scleral icterus.  Neck: Normal range of motion. Neck supple. No JVD present. No tracheal deviation present. No thyromegaly present.  Cardiovascular: Normal rate, regular rhythm, normal heart sounds and intact distal pulses.   Pulmonary/Chest: Effort normal and breath sounds normal. No respiratory distress. She has no wheezes. She has no rales.  Abdominal: Soft. Bowel sounds are normal. She exhibits distension. She exhibits no mass. There is no tenderness. There is no rebound and no guarding.  Trace abdominal distention noted on exam today.  No tenderness  to abdomen with palpation.  Musculoskeletal: Normal range of motion. She exhibits no edema or tenderness.  Lymphadenopathy:    She has no cervical adenopathy.  Neurological: She is alert and oriented to person, place, and time. Gait normal.  Skin: Skin is warm and dry. No rash noted. No erythema. There is pallor.  Psychiatric: Affect normal.  Nursing note and vitals reviewed.   LABORATORY DATA:. No visits with results within 3 Day(s) from this visit. Latest known visit with results is:  Appointment on 10/04/2014  Component Date Value Ref Range Status  . WBC 10/04/2014 4.8  3.9 - 10.3 10e3/uL Final  . NEUT# 10/04/2014 4.0  1.5 - 6.5 10e3/uL Final  . HGB 10/04/2014 10.2* 11.6 - 15.9 g/dL Final  . HCT 10/04/2014 32.2* 34.8 - 46.6 % Final  . Platelets 10/04/2014 171  145 - 400 10e3/uL Final  . MCV 10/04/2014 82.2  79.5 - 101.0 fL Final  . MCH 10/04/2014 26.0  25.1 - 34.0 pg Final  . MCHC 10/04/2014 31.7  31.5 - 36.0 g/dL Final  . RBC 10/04/2014 3.92  3.70 - 5.45 10e6/uL Final  . RDW 10/04/2014 24.6* 11.2 - 14.5 % Final  . lymph# 10/04/2014 0.2* 0.9 - 3.3 10e3/uL Final  . MONO# 10/04/2014 0.5  0.1 - 0.9 10e3/uL Final  . Eosinophils Absolute 10/04/2014 0.0  0.0 - 0.5 10e3/uL Final  . Basophils Absolute 10/04/2014 0.0  0.0 - 0.1 10e3/uL Final  . NEUT% 10/04/2014 83.7* 38.4 - 76.8 % Final  . LYMPH% 10/04/2014 4.5* 14.0 - 49.7 % Final  . MONO% 10/04/2014 10.8  0.0 - 14.0 % Final  . EOS% 10/04/2014 0.7  0.0 - 7.0 % Final  . BASO% 10/04/2014 0.3  0.0 - 2.0 % Final  . Sodium 10/04/2014 136  136 - 145 mEq/L Final  . Potassium 10/04/2014 3.9  3.5 - 5.1 mEq/L Final  . Chloride 10/04/2014 101  98 - 109 mEq/L Final  . CO2 10/04/2014 25  22 - 29 mEq/L Final  . Glucose 10/04/2014 125  70 - 140 mg/dl Final  . BUN 10/04/2014 8.0  7.0 - 26.0 mg/dL Final  . Creatinine 10/04/2014 0.6  0.6 - 1.1 mg/dL Final  . Total Bilirubin 10/04/2014 1.30* 0.20 - 1.20 mg/dL Final  . Alkaline Phosphatase  10/04/2014 439* 40 - 150 U/L Final  . AST 10/04/2014 88* 5 - 34 U/L Final  . ALT 10/04/2014 19  0 - 55 U/L Final  . Total Protein 10/04/2014 6.6  6.4 - 8.3 g/dL Final  . Albumin 10/04/2014 2.5* 3.5 - 5.0 g/dL Final  . Calcium 10/04/2014 8.6  8.4 - 10.4 mg/dL Final  . Anion Gap 10/04/2014 10  3 - 11 mEq/L Final     RADIOGRAPHIC STUDIES: No results found.  ASSESSMENT/PLAN:    Anorexia Patient continues with minimal appetite.  Most likely this is secondary to both her cancer and hematuria be side effect.  Patient was encouraged to eat multiple small meals throughout the day.  Patient was also advised to premedicate herself with anti-emetics as well.    Colon cancer Patient is status post protocol therapy study drug per [facility].  Patient obtained a restaging scan in early November 2015 which revealed progression of his disease to the lung and liver.  All further therapy drug was discontinued at that time.  Patient continues to hold on initiation of oral therapy drug Lonsurf until she becomes stronger.    Constipation Patient continues to complain of some chronic constipation. Most likely, constipation is secondary to narcotic pain medication administration.   She states that she has been taking the stool softeners and Maalox as previously directed.  She states that when she does have a bowel movement it is typically very soft and non-formed. Abdomen does appear more distended; and slightly firm today on  exam as compared to previous exams last week of abdomen.   Will continue to monitor closely.    Dehydration Patient continues to complain of minimal appetite; and has little oral intake.  Patient does however appear slightly stronger at each exam.  Patient will receive 1 L normal saline fluid rehydration today.      Nausea with vomiting Patient continues to complain of intermittent nausea; and occasional vomiting.  Patient does take both Zofran and Ativan at home on intermittent  basis for treatment of her nausea.  Patient was given both Zofran and Ativan IV while in the infusion center today.    Neoplasm related pain Since switching to a sentinel patch earlier this week; patient states that her pain is much better under control.  She only occasionally takes a Percocet for breakthrough pain now.     Patient stated understanding of all instructions; and was in agreement with this plan of care. The patient knows to call the clinic with any problems, questions or concerns.   Review/collaboration with Dr. Benay Spice regarding all aspects of patient's visit today.   Total time spent with patient 25 minutes;  with greater than 80 percent of that time spent in face to face counseling regarding her symptoms,dination of care and follow up.  Disclaimer: This note was dictated with voice recognition software. Similar sounding words can inadvertently be transcribed and may not be corrected upon review.   Drue Second, NP 10/10/2014

## 2014-10-10 NOTE — Procedures (Signed)
Successful US guided paracentesis from RLQ.  Yielded 1.8L of clear yellow fluid.  No immediate complications.  Pt tolerated well.   Specimen was sent for labs.  Ascencion Dike PA-C 10/10/2014 2:52 PM

## 2014-10-10 NOTE — Telephone Encounter (Signed)
Lft msg for pt confirming MD visit per 12/08 POF, mailed out schedule..... KJ

## 2014-10-17 ENCOUNTER — Telehealth: Payer: Self-pay | Admitting: *Deleted

## 2014-10-17 DIAGNOSIS — C189 Malignant neoplasm of colon, unspecified: Secondary | ICD-10-CM

## 2014-10-17 DIAGNOSIS — R112 Nausea with vomiting, unspecified: Secondary | ICD-10-CM

## 2014-10-17 MED ORDER — PREDNISONE 5 MG PO TABS: 5.0000 mg | ORAL_TABLET | Freq: Every day | ORAL | Status: AC

## 2014-10-17 NOTE — Telephone Encounter (Signed)
Notified Maura, RN via [employer]

## 2014-10-17 NOTE — Telephone Encounter (Signed)
Has thick white coating on her tongue, Dr. Lyman Speller wrote for nystatin rinses qid (rinse and spit). The Haldol is helping her nausea better, seems to work best at 3mg  ( 1 1/2 tab) for now. Her main complaint is weakness and lethargy. Her conversations are difficult to follow at times and voice is barely audible to nurse. Oxygen sats are 92%, she reports no dyspnea and pain controlled. Did note some JVD. Going upstairs to bedroom Chiropractor). She has not agreed yet to 3-in-1 commode seat or any equipment.  Sister asking nurse how much time she has? RN unable to provide her this answer. Uses [facility] on [address].

## 2014-10-17 NOTE — Addendum Note (Signed)
Addended by: Tania Ade on: 10/17/2014 10:33 AM   Modules accepted: Orders

## 2014-10-17 NOTE — Telephone Encounter (Signed)
Per Ned Card, NP : OK to try low dose prednisone 5 mg daily.

## 2014-10-18 ENCOUNTER — Ambulatory Visit: Payer: BC Managed Care – PPO | Admitting: Oncology

## 2014-10-18 ENCOUNTER — Other Ambulatory Visit: Payer: BC Managed Care – PPO

## 2014-10-19 ENCOUNTER — Telehealth: Payer: Self-pay | Admitting: *Deleted

## 2014-10-19 DIAGNOSIS — C189 Malignant neoplasm of colon, unspecified: Secondary | ICD-10-CM

## 2014-10-19 NOTE — Telephone Encounter (Signed)
Having difficulty with BM-constipation. Was using Miralax bid, but po's are minimal now. Still vomits on occasion. Asking what to try for her bowels, or is it time to not be concerned about this? Family member telling her she can't use suppository due to location of her cancer (unsure if this is correct). Natalie Mccarthy is progressively weaker, restless, confused and having more swelling. Voice is very hoarse and weak. Oxygen sats are down to 93%-she will order home oxygen. Husband and her sister have agreed that DNR is needed-needs order. [name] is pushing herself to come in tomorrow, but she is so weak (they have no w/c) that nurse thinks it would do her more harm than good to come in. Feels her main goal for coming in tomorrow is to tell Dr. Benay Spice "goodbye". Asking if MD would call home and talk with Ondine and her husband?

## 2014-10-20 ENCOUNTER — Ambulatory Visit: Payer: BC Managed Care – PPO | Admitting: Nurse Practitioner

## 2014-10-23 ENCOUNTER — Telehealth: Payer: Self-pay | Admitting: *Deleted

## 2014-10-23 NOTE — Telephone Encounter (Signed)
Received message from [place]; pt passed away on 05-Nov-2014 @ 11:59 pm.  MD aware.

## 2014-11-03 DEATH — deceased

## 2015-12-08 ENCOUNTER — Other Ambulatory Visit: Payer: Self-pay | Admitting: Oncology

## 2022-11-03 DEATH — deceased
